# Patient Record
Sex: Female | Born: 1950
Health system: Southern US, Community
[De-identification: ages and names within clinical notes are randomized; demographics above are authoritative.]

## PROBLEM LIST (undated history)

## (undated) DIAGNOSIS — F419 Anxiety disorder, unspecified: Secondary | ICD-10-CM

## (undated) DIAGNOSIS — I10 Essential (primary) hypertension: Secondary | ICD-10-CM

## (undated) DIAGNOSIS — M1990 Unspecified osteoarthritis, unspecified site: Secondary | ICD-10-CM

## (undated) DIAGNOSIS — I639 Cerebral infarction, unspecified: Secondary | ICD-10-CM

## (undated) DIAGNOSIS — J189 Pneumonia, unspecified organism: Secondary | ICD-10-CM

## (undated) DIAGNOSIS — I219 Acute myocardial infarction, unspecified: Secondary | ICD-10-CM

## (undated) DIAGNOSIS — I739 Peripheral vascular disease, unspecified: Secondary | ICD-10-CM

## (undated) DIAGNOSIS — E785 Hyperlipidemia, unspecified: Secondary | ICD-10-CM

## (undated) DIAGNOSIS — Z8489 Family history of other specified conditions: Secondary | ICD-10-CM

## (undated) DIAGNOSIS — G4730 Sleep apnea, unspecified: Secondary | ICD-10-CM

## (undated) DIAGNOSIS — E119 Type 2 diabetes mellitus without complications: Secondary | ICD-10-CM

## (undated) DIAGNOSIS — I2510 Atherosclerotic heart disease of native coronary artery without angina pectoris: Secondary | ICD-10-CM

## (undated) DIAGNOSIS — T8859XA Other complications of anesthesia, initial encounter: Secondary | ICD-10-CM

## (undated) HISTORY — PX: COLONOSCOPY: SHX174

## (undated) HISTORY — PX: CARDIAC CATHETERIZATION: SHX172

## (undated) HISTORY — DX: Atherosclerotic heart disease of native coronary artery without angina pectoris: I25.10

## (undated) HISTORY — DX: Type 2 diabetes mellitus without complications: E11.9

## (undated) HISTORY — PX: OTHER SURGICAL HISTORY: SHX169

## (undated) HISTORY — DX: Essential (primary) hypertension: I10

## (undated) HISTORY — DX: Hyperlipidemia, unspecified: E78.5

## (undated) HISTORY — DX: Anxiety disorder, unspecified: F41.9

## (undated) HISTORY — PX: CORONARY STENT PLACEMENT: SHX1402

---

## 1998-10-24 ENCOUNTER — Other Ambulatory Visit: Admission: RE | Admit: 1998-10-24 | Discharge: 1998-10-24 | Payer: Self-pay

## 2002-01-24 ENCOUNTER — Other Ambulatory Visit: Admission: RE | Admit: 2002-01-24 | Discharge: 2002-01-24 | Payer: Self-pay | Admitting: Family Medicine

## 2003-03-08 ENCOUNTER — Other Ambulatory Visit: Admission: RE | Admit: 2003-03-08 | Discharge: 2003-03-08 | Payer: Self-pay | Admitting: Obstetrics and Gynecology

## 2003-03-10 ENCOUNTER — Ambulatory Visit (HOSPITAL_COMMUNITY): Admission: RE | Admit: 2003-03-10 | Discharge: 2003-03-10 | Payer: Self-pay | Admitting: Obstetrics and Gynecology

## 2003-03-10 ENCOUNTER — Encounter: Payer: Self-pay | Admitting: Obstetrics and Gynecology

## 2004-03-20 ENCOUNTER — Other Ambulatory Visit: Admission: RE | Admit: 2004-03-20 | Discharge: 2004-03-20 | Payer: Self-pay | Admitting: Obstetrics and Gynecology

## 2004-03-28 ENCOUNTER — Encounter: Admission: RE | Admit: 2004-03-28 | Discharge: 2004-03-28 | Payer: Self-pay | Admitting: Cardiology

## 2004-04-25 ENCOUNTER — Ambulatory Visit (HOSPITAL_COMMUNITY): Admission: RE | Admit: 2004-04-25 | Discharge: 2004-04-25 | Payer: Self-pay | Admitting: Obstetrics and Gynecology

## 2004-05-28 ENCOUNTER — Ambulatory Visit (HOSPITAL_BASED_OUTPATIENT_CLINIC_OR_DEPARTMENT_OTHER): Admission: RE | Admit: 2004-05-28 | Discharge: 2004-05-28 | Payer: Self-pay | Admitting: Cardiology

## 2004-08-11 ENCOUNTER — Emergency Department (HOSPITAL_COMMUNITY): Admission: EM | Admit: 2004-08-11 | Discharge: 2004-08-11 | Payer: Self-pay | Admitting: Emergency Medicine

## 2005-02-10 ENCOUNTER — Other Ambulatory Visit: Admission: RE | Admit: 2005-02-10 | Discharge: 2005-02-10 | Payer: Self-pay | Admitting: Obstetrics and Gynecology

## 2005-02-20 ENCOUNTER — Ambulatory Visit: Payer: Self-pay | Admitting: Internal Medicine

## 2005-03-07 ENCOUNTER — Ambulatory Visit: Payer: Self-pay | Admitting: Internal Medicine

## 2006-05-19 ENCOUNTER — Ambulatory Visit: Payer: Self-pay | Admitting: Family Medicine

## 2006-07-08 ENCOUNTER — Ambulatory Visit: Payer: Self-pay | Admitting: Family Medicine

## 2007-09-27 ENCOUNTER — Emergency Department (HOSPITAL_COMMUNITY): Admission: EM | Admit: 2007-09-27 | Discharge: 2007-09-28 | Payer: Self-pay | Admitting: Emergency Medicine

## 2008-04-25 ENCOUNTER — Encounter: Admission: RE | Admit: 2008-04-25 | Discharge: 2008-04-25 | Payer: Self-pay | Admitting: Internal Medicine

## 2008-08-21 ENCOUNTER — Encounter: Admission: RE | Admit: 2008-08-21 | Discharge: 2008-08-21 | Payer: Self-pay | Admitting: Internal Medicine

## 2009-03-26 ENCOUNTER — Encounter: Admission: RE | Admit: 2009-03-26 | Discharge: 2009-03-26 | Payer: Self-pay | Admitting: Family Medicine

## 2010-04-25 ENCOUNTER — Encounter (INDEPENDENT_AMBULATORY_CARE_PROVIDER_SITE_OTHER): Payer: Self-pay | Admitting: *Deleted

## 2010-11-10 ENCOUNTER — Encounter: Payer: Self-pay | Admitting: Obstetrics and Gynecology

## 2010-11-19 NOTE — Letter (Signed)
Summary: Colonoscopy Letter  [facility]  [address] [place], [place] [address]   Phone: [phone]  Fax: [phone]      April 25, 2010 MRN: [id number]   Tammy Graham [address] [address] [place], [place]  [address]   Dear [name],   According to your medical record, it is time for you to schedule a Colonoscopy. The American Cancer Society recommends this procedure as a method to detect early colon cancer. Patients with a family history of colon cancer, or a personal history of colon polyps or inflammatory bowel disease are at increased risk.  This letter has beeen generated based on the recommendations made at the time of your procedure. If you feel that in your particular situation this may no longer apply, please contact our office.  Please call our office at [phone] to schedule this appointment or to update your records at your earliest convenience.  Thank you for cooperating with [name] to provide you with the very best care possible.   Sincerely,  Hedwig Morton. Juanda Chance, M.D.  [facility] [phone]

## 2011-03-07 NOTE — Procedures (Signed)
NAME:  Tammy Graham, Tammy Graham             ACCOUNT NO.:  [id number]   MEDICAL RECORD NO.:  [id number]          PATIENT TYPE:  OUT   LOCATION:  SLEEP CENTER                 FACILITY:  [facility]   PHYSICIAN:  Clinton D. Maple Hudson, M.D. DATE OF BIRTH:  03-Jun-1951   DATE OF ADMISSION:  05/28/2004  DATE OF DISCHARGE:  05/28/2004                              NOCTURNAL POLYSOMNOGRAM   REFERRING PHYSICIAN:  Dr. Kevin Fenton Spruill   INDICATION FOR STUDY:  Hypersomnia with sleep apnea.  Difficulty initiating  and maintaining sleep.  Excessive daytime somnolence.   EPWORTH SLEEPINESS SCORE:  11/24   NECK SIZE:  15 inches   BODY MASS INDEX:  28.5   WEIGHT:  178 pounds   MEDICATIONS LISTED:  Actos.   SLEEP ARCHITECTURE:  Total sleep time 342 minutes for a sleep efficiency of  85%.  Stage I 7%, Stage II 59%, Stages III and IV 14%.  REM 20% of total  sleep time.  Latency to sleep onset 3.5 minutes.  Latency to REM 43 minutes.  Awake after sleep onset 59 minutes.  Arousal index 18.6/hr.  No medications  taken at bedtime.   RESPIRATORY DATA:  Mild obstructive sleep apnea/hypopnea syndrome, RDI 6.8  per hour.  This reflected 38 obstructive hypopneas and 1 obstructive apnea.  Most sleep and most events were recorded while supine, but some hypopneas  were noted while sleeping on right side.  She did not qualify for a split  study protocol because of insufficient early events.  Note that she had  taken a two hour nap prior to arriving at the sleep center.   OXYGEN DATA:  Moderate to loud snoring with mild oxygen desaturation to 80%  associated with apneas and hypopneas.  Baseline room air oxygen saturation  while awake was 98%.  Mean oxygen saturation through the study was 92%.   CARDIAC DATA:  Normal cardiac rhythm.   MOVEMENT/PARASOMNIA:  132 body jerks were recorded of which 39 were  associated with arousals for a period limb movement with arousal index of  6.8 per hour which is mildly abnormal.   IMPRESSION/RECOMMENDATION:  Mild obstructive sleep apnea/hypopnea syndrome,  RDI 6.8 per hour with mild oxygen desaturation and normal cardiac rhythm.  Periodic limb movement with arousal, 6.8 per hour which may respond to  treatment with clonazepam or Requip.                                   ______________________________                                Rennis Chris. Maple Hudson, M.D.                                Diplomate, Biomedical engineer of Sleep Medicine    CDY/MEDQ  D:  06/02/2004 10:56:38  T:  06/03/2004 00:59:08  Job:  [id number]

## 2011-04-06 ENCOUNTER — Emergency Department (HOSPITAL_COMMUNITY): Payer: Self-pay

## 2011-04-06 ENCOUNTER — Inpatient Hospital Stay (HOSPITAL_COMMUNITY)
Admission: EM | Admit: 2011-04-06 | Discharge: 2011-04-09 | DRG: 247 | Disposition: A | Discharge status: Home / Self Care | Payer: Self-pay | Attending: Cardiology | Admitting: Cardiology

## 2011-04-06 DIAGNOSIS — Z8249 Family history of ischemic heart disease and other diseases of the circulatory system: Secondary | ICD-10-CM

## 2011-04-06 DIAGNOSIS — I2510 Atherosclerotic heart disease of native coronary artery without angina pectoris: Secondary | ICD-10-CM | POA: Diagnosis present

## 2011-04-06 DIAGNOSIS — R4182 Altered mental status, unspecified: Secondary | ICD-10-CM | POA: Diagnosis not present

## 2011-04-06 DIAGNOSIS — I10 Essential (primary) hypertension: Secondary | ICD-10-CM | POA: Diagnosis present

## 2011-04-06 DIAGNOSIS — Y9210 Unspecified residential institution as the place of occurrence of the external cause: Secondary | ICD-10-CM | POA: Diagnosis not present

## 2011-04-06 DIAGNOSIS — T40605A Adverse effect of unspecified narcotics, initial encounter: Secondary | ICD-10-CM | POA: Diagnosis not present

## 2011-04-06 DIAGNOSIS — T424X5A Adverse effect of benzodiazepines, initial encounter: Secondary | ICD-10-CM | POA: Diagnosis not present

## 2011-04-06 DIAGNOSIS — E119 Type 2 diabetes mellitus without complications: Secondary | ICD-10-CM | POA: Diagnosis present

## 2011-04-06 DIAGNOSIS — F17200 Nicotine dependence, unspecified, uncomplicated: Secondary | ICD-10-CM | POA: Diagnosis present

## 2011-04-06 DIAGNOSIS — E785 Hyperlipidemia, unspecified: Secondary | ICD-10-CM | POA: Diagnosis present

## 2011-04-06 DIAGNOSIS — I214 Non-ST elevation (NSTEMI) myocardial infarction: Principal | ICD-10-CM | POA: Diagnosis present

## 2011-04-06 LAB — DIFFERENTIAL
Basophils Absolute: 0 10*3/uL (ref 0.0–0.1)
Basophils Relative: 0 % (ref 0–1)
Eosinophils Absolute: 0.2 10*3/uL (ref 0.0–0.7)
Eosinophils Relative: 2 % (ref 0–5)
Lymphocytes Relative: 31 % (ref 12–46)
Lymphs Abs: 2.7 10*3/uL (ref 0.7–4.0)
Monocytes Absolute: 0.5 10*3/uL (ref 0.1–1.0)
Monocytes Relative: 6 % (ref 3–12)
Neutro Abs: 5.3 10*3/uL (ref 1.7–7.7)
Neutrophils Relative %: 61 % (ref 43–77)

## 2011-04-06 LAB — CBC
HCT: 39.8 % (ref 36.0–46.0)
Hemoglobin: 14.3 g/dL (ref 12.0–15.0)
MCH: 33.2 pg (ref 26.0–34.0)
MCHC: 35.9 g/dL (ref 30.0–36.0)
MCV: 92.3 fL (ref 78.0–100.0)
Platelets: 219 10*3/uL (ref 150–400)
RBC: 4.31 MIL/uL (ref 3.87–5.11)
RDW: 12.9 % (ref 11.5–15.5)
WBC: 8.7 10*3/uL (ref 4.0–10.5)

## 2011-04-06 LAB — BASIC METABOLIC PANEL
BUN: 16 mg/dL (ref 6–23)
CO2: 30 mEq/L (ref 19–32)
Calcium: 10.6 mg/dL — ABNORMAL HIGH (ref 8.4–10.5)
Chloride: 101 mEq/L (ref 96–112)
Creatinine, Ser: 1.05 mg/dL (ref 0.50–1.10)
GFR calc Af Amer: >60 mL/min (ref >60)
GFR calc non Af Amer: 53 mL/min — ABNORMAL LOW (ref >60)
Glucose, Bld: 172 mg/dL — ABNORMAL HIGH (ref 70–99)
Potassium: 4.4 mEq/L (ref 3.5–5.1)
Sodium: 139 mEq/L (ref 135–145)

## 2011-04-06 LAB — CK TOTAL AND CKMB (NOT AT ARMC)
CK, MB: 4.1 ng/mL — ABNORMAL HIGH (ref 0.3–4.0)
Relative Index: 1.5 (ref 0.0–2.5)
Total CK: 273 U/L — ABNORMAL HIGH (ref 7–177)

## 2011-04-06 LAB — TROPONIN I: Troponin I: 0.61 ng/mL (ref <0.30)

## 2011-04-07 ENCOUNTER — Inpatient Hospital Stay (HOSPITAL_COMMUNITY): Payer: Self-pay

## 2011-04-07 DIAGNOSIS — I2510 Atherosclerotic heart disease of native coronary artery without angina pectoris: Secondary | ICD-10-CM

## 2011-04-07 LAB — HEMOGLOBIN A1C
Hgb A1c MFr Bld: 7.5 % — ABNORMAL HIGH (ref <5.7)
Mean Plasma Glucose: 169 mg/dL — ABNORMAL HIGH (ref <117)

## 2011-04-07 LAB — LIPID PANEL
Cholesterol: 163 mg/dL (ref 0–200)
HDL: 40 mg/dL (ref >39)
LDL Cholesterol: 97 mg/dL (ref 0–99)
Total CHOL/HDL Ratio: 4.1 RATIO
Triglycerides: 132 mg/dL (ref <150)
VLDL: 26 mg/dL (ref 0–40)

## 2011-04-07 LAB — CARDIAC PANEL(CRET KIN+CKTOT+MB+TROPI)
CK, MB: 18.1 ng/mL (ref 0.3–4.0)
CK, MB: 23.6 ng/mL (ref 0.3–4.0)
Relative Index: 4.3 — ABNORMAL HIGH (ref 0.0–2.5)
Relative Index: 5 — ABNORMAL HIGH (ref 0.0–2.5)
Total CK: 419 U/L — ABNORMAL HIGH (ref 7–177)
Total CK: 472 U/L — ABNORMAL HIGH (ref 7–177)
Troponin I: 4.06 ng/mL (ref <0.30)
Troponin I: 14.78 ng/mL (ref <0.30)

## 2011-04-07 LAB — MRSA PCR SCREENING: MRSA by PCR: NEGATIVE

## 2011-04-07 LAB — PROTIME-INR
INR: 0.94 (ref 0.00–1.49)
Prothrombin Time: 12.8 seconds (ref 11.6–15.2)

## 2011-04-07 LAB — GLUCOSE, CAPILLARY: Glucose-Capillary: 164 mg/dL — ABNORMAL HIGH (ref 70–99)

## 2011-04-07 LAB — POCT ACTIVATED CLOTTING TIME: Activated Clotting Time: 424 seconds

## 2011-04-07 LAB — HEPARIN LEVEL (UNFRACTIONATED): Heparin Unfractionated: 0.61 IU/mL (ref 0.30–0.70)

## 2011-04-08 LAB — BASIC METABOLIC PANEL
CO2: 26 mEq/L (ref 19–32)
Chloride: 105 mEq/L (ref 96–112)
Creatinine, Ser: 0.99 mg/dL (ref 0.50–1.10)
Glucose, Bld: 166 mg/dL — ABNORMAL HIGH (ref 70–99)
Sodium: 139 mEq/L (ref 135–145)

## 2011-04-08 LAB — CBC
Hemoglobin: 13.7 g/dL (ref 12.0–15.0)
MCV: 93.1 fL (ref 78.0–100.0)
Platelets: 210 10*3/uL (ref 150–400)
RBC: 4.18 MIL/uL (ref 3.87–5.11)
WBC: 9.2 10*3/uL (ref 4.0–10.5)

## 2011-04-08 NOTE — Consult Note (Signed)
Tammy Graham, BROPHY NO.:  [id number]  MEDICAL RECORD NO.:  [id number]  LOCATION:  6525                         FACILITY:  [facility]  PHYSICIAN:  Tammy Heritage, MD       DATE OF BIRTH:  17-Mar-1951  DATE OF CONSULTATION:  04/07/2011 DATE OF DISCHARGE:                                CONSULTATION   REFERRING PHYSICIAN:  Cardiology Cath Team.  REASON FOR CONSULTATION:  Code stroke.  CHIEF COMPLAINT:  Sudden altered mental status.  HISTORY OF PRESENT ILLNESS:  This patient is a 60 year old woman who has been admitted for cardiac catheterization after having a chest pain and is status post catheterization half an hour ago or so.  It was noted that when she came out of the cardiac catheterization she had altered mental status in a way that she was not able to recognize her family members.  There is no mention of any focal neurological deficit otherwise. Talking to the patient, she is somewhat more awake now and still stays nonfocal.  However, she is not back to her baseline mentation still.  A CT scan of the head was performed that did not show any acute intracranial abnormality. It is important to mention that the patient has been on IV heparin for the catheterization, also had 4 mg of Versed and 50 mcg of fentanyl during the procedure.  PAST MEDICAL HISTORY:  Hypertension, hyperlipidemia.  PAST SURGICAL HISTORY:  Arthroscopic knee surgery, left side.  ALLERGIES:  No known drug allergies.  MEDICATIONS: 1. She is on Norvasc 10 mg daily. 2. Aspirin 325 mg daily. 3. She was on heparin infusion. 4. Hydrochlorothiazide 25 mg daily.  SOCIAL HISTORY:  She is widowed, has three children.  Smokes almost half a pack a day.  Denies alcohol abuse or illicit drug abuse.  FAMILY HISTORY:  Mother had MI at age 62.  The history from the father side is unknown.  REVIEW OF CLINICAL DATA:  I have reviewed her CT scan of the head performed today and is found to be  unremarkable for any acute abnormality.  I have seen her lab work showing with elevated HbA1c at 7.5 and therapeutic level of unfractionated heparin, LDL of 97, and MB bound creatine kinase of 18.1.  Troponin is as high as well 4.06.  PHYSICAL EXAMINATION:  Comfortably lying down on the bed.  Blood pressure 128/68 mmHg, pulse of 78 per minute. She is not oriented to person or time appropriately yet; however, she did eventually recognize her daughter, called her name. I do not see any aphasia or dysarthria.  NIH stroke scale remained 0 or maybe 1 for mild confusion. Bilateral pupils reactive to light and accommodation with no field cut noted in any quadrant, moves eyes to all direction.  Symmetrical facial sensation and strength testing.  Tongue midline without atrophy or fasciculation.  Palate elevates symmetrically bilaterally. Motor examination reveals strength 5/5 all over.  There is no drift noted. Sensory examination reveals that she admits to feel light touch sensation all over symmetrically equal. Gait was deferred.  IMPRESSION:  A 60 year old woman, has altered mental status with known focality on exam after having cardiac catheterization procedure and  also received fentanyl and versed during that period. My impression is that her clinical situation is less likely result of any focal CNS ischemic pathology rather more of the medication affects during the cardiac catheterization. A TPA will not be offered in this situation because of NIH stroke scale is 1 at the most and symptoms are less likely to be stroke related. Also, she has been on heparin infusion.  PLAN:  I have discussed my impression in detail with the patient's cardiologist over the phone and have suggested giving her antiplatelets as already planned for the cardiac reasons as well. If her mental status does not return to normal baseline until tomorrow morning then I will suggest getting her noncontrast MRI of  the brain at that time. There is no other neurological intervention necessary at this time.          ______________________________ Tammy Heritage, MD     WS/MEDQ  D:  04/07/2011  T:  04/08/2011  Job:  [id number]  Electronically Signed by Tammy Heritage MD on 04/08/2011 08:34:43 AM

## 2011-04-09 DIAGNOSIS — I214 Non-ST elevation (NSTEMI) myocardial infarction: Secondary | ICD-10-CM

## 2011-04-09 LAB — BASIC METABOLIC PANEL
BUN: 14 mg/dL (ref 6–23)
CO2: 27 mEq/L (ref 19–32)
Calcium: 9.4 mg/dL (ref 8.4–10.5)
Chloride: 104 mEq/L (ref 96–112)
Creatinine, Ser: 0.8 mg/dL (ref 0.50–1.10)

## 2011-04-09 LAB — CBC
HCT: 39.6 % (ref 36.0–46.0)
MCH: 32.3 pg (ref 26.0–34.0)
MCV: 93.4 fL (ref 78.0–100.0)
RBC: 4.24 MIL/uL (ref 3.87–5.11)
WBC: 7.6 10*3/uL (ref 4.0–10.5)

## 2011-04-09 LAB — URINALYSIS, DIPSTICK ONLY
Bilirubin Urine: NEGATIVE
Hgb urine dipstick: NEGATIVE
Ketones, ur: NEGATIVE mg/dL
Protein, ur: NEGATIVE mg/dL
Urobilinogen, UA: 1 mg/dL (ref 0.0–1.0)

## 2011-04-10 NOTE — Cardiovascular Report (Signed)
Tammy Graham, Tammy Graham NO.:  [id number]  MEDICAL RECORD NO.:  [id number]  LOCATION:  6525                         FACILITY:  [facility]  PHYSICIAN:  Verne Carrow, MDDATE OF BIRTH:  10-14-1951  DATE OF PROCEDURE:  04/07/2011 DATE OF DISCHARGE:                           CARDIAC CATHETERIZATION   PROCEDURES PERFORMED: 1. Left heart catheterization. 2. Selective coronary angiography. 3. Left ventricular angiogram. 4. Percutaneous transluminal coronary angioplasty with placement of a     drug-eluting stent in the ostium of the left anterior descending     artery.  OPERATOR:  Verne Carrow, MD.  ARTERIAL ACCESS SITE:  Right radial artery.  INDICATIONS:  This is a 60 year old African American female with a history of hypertension and hyperlipidemia who was admitted to the hospital on April 06, 2011, with complaints of chest pain.  She ruled in for myocardial infarction with serial cardiac enzymes.  Diagnostic catheterization was arranged for today.  PROCEDURE IN DETAIL:  The patient was brought to the Main Cardiac Catheterization Laboratory after signing informed consent for the procedure.  An Freida Busman test was performed on the right wrist and was positive.  The right wrist was prepped and draped in a sterile fashion. Lidocaine 1%  was used for local anesthesia.  A 5-French sheath was inserted into the right radial artery without difficulty.  Standard diagnostic catheters were used to perform selective coronary angiography.  3 mg of verapamil was given through the sheath after insertion.  4000 units of intravenous heparin was given after sheath insertion.  A pigtail catheter was used to perform a left ventricular angiogram.  At this point in procedure, we elected to proceed with intervention of the severe stenosis in the ostium of the left anterior descending artery.  The sheath was up-sized to a 6-French sheath.  The patient was given a bolus of  Angiomax and a drip was started.  The patient was given 60 mg of Effient.  We then engaged the left main artery with a JL-3.5 guiding catheter.  This was not my first choice of guiding catheters, however, we could not get any of our standard guiding catheters to fit into the root and to engage the left main artery.  Once the guide was seated in the left main artery, we checked an ACT which was greater than 200.  We then passed a Cougar intracoronary wire down the length of the left anterior descending artery.  A 2.5 x 12 mm balloon was inflated in the area of tightest stenosis.  A 3.0 x 12 mm Promus Element drug- eluting stent was carefully positioned in the ostium of the left anterior descending artery and deployed without difficulty.  A 3.25 x 9 mm noncompliant balloon was carefully positioned inside the vessel and was inflated to 16 atmospheres.  There was an excellent angiographic result.  The stenosis was taken from 99% down to 0%.  There was excellent flow into the distal vessel.  There were no immediate complications.  The sheath was removed here in the Cath Lab and a Terumo hemostasis band was applied over the arteriotomy site.  The patient was taken to the recovery area in stable condition.  HEMODYNAMIC FINDINGS:  Central aortic pressure 109/69.  Left ventricular pressure 113/13/17.  ANGIOGRAPHIC FINDINGS: 1. The left main coronary artery was a short segment and was normal. 2. The left anterior descending artery was a large vessel that coursed     to the apex and gave off several diagonal branches.  The ostium of     the LAD had a 99% stenosis.  There was no other severe disease in     the vessel. 3. The circumflex artery gave off a small first obtuse marginal branch     and a moderate to large sized second obtuse marginal branch.  There     was mild plaque disease in this vessel, but no focally obstructive     lesions. 4. The right coronary artery was a moderate-sized  dominant vessel with     mild 30% plaque in the midportion. 5. Left ventricular angiogram was performed in the RAO projection and     showed normal left ventricular systolic function with ejection     fraction of 65%.  IMPRESSION: 1. Single-vessel coronary artery disease in the setting of a non-ST-     elevation myocardial infarction. 2. Successful percutaneous transluminal coronary angioplasty with     placement of a drug-eluting stent in the proximal portion of the     left anterior descending artery extending back to the ostium. 3. Normal left ventricular systolic function.  RECOMMENDATIONS:  The patient should be continued on dual antiplatelet therapy for at least 1 year.  We will continue aspirin 81 mg as well as Effient 10 mg.  She will be continued on a beta-blocker.  A statin will be started.  We will most likely continue her on Effient for several months and then switched to Plavix.  She will be watched closely tonight and discharged home tomorrow if she is stable.     Verne Carrow, MD     CM/MEDQ  D:  04/07/2011  T:  04/08/2011  Job:  [id number]  Electronically Signed by Verne Carrow MD on 04/10/2011 09:02:23 AM

## 2011-04-11 NOTE — Discharge Summary (Signed)
Tammy Graham, TAPLEY NO.:  [id number]  MEDICAL RECORD NO.:  [id number]  LOCATION:  6525                         FACILITY:  [facility]  PHYSICIAN:  Verne Carrow, MDDATE OF BIRTH:  12/17/1950  DATE OF ADMISSION:  04/06/2011 DATE OF DISCHARGE:  04/09/2011                              DISCHARGE SUMMARY   PRIMARY CARDIOLOGIST:  Verne Carrow, MD  Primary care will be [facility] in the future.  DISCHARGE DIAGNOSIS:  Non-ST-segment elevation myocardial infarction.  SECONDARY DIAGNOSES: 1. Coronary artery disease, status post successful percutaneous     coronary intervention and drug-eluting stent placement to the left     anterior descending this admission. 2. Hypertension. 3. Hyperlipidemia. 4. Altered mental status following catheterization with normal head     CT. 5. Tobacco abuse. 6. Newly diagnosed diabetes mellitus.  ALLERGIES:  No known drug allergies.  PROCEDURES: 1. Left heart cardiac catheterization, performed on April 07, 2011,     revealing a 99% ostial left anterior descending stenosis with mild     plaque in the left circumflex and a 30% mid right coronary artery     stenosis.  Ejection fraction of 65%.  The ostial left anterior     descending was successfully stented with a 3.0 x 12 mm Promus     Element Plus drug-eluting stent. 2. CT of the head without contrast showing no acute or significant     findings.  HISTORY OF PRESENT ILLNESS:  A 60 year old female without prior history of coronary artery disease was in her usual state of health until several months prior to admission when she began to experience intermittent chest and left arm numbness.  On the evening of April 06, 2011, the patient noted severe chest pressure with associated diaphoresis, which awoke him from sleep.  This pain persisted.  She presented to the [facility] ED where she was noted to have an elevation of her troponin to 0.61.  ECG showed no acute changes.  The  patient was treated with aspirin, beta-blocker, and heparin, and admitted for further evaluation.  HOSPITAL COURSE:  The patient eventually peaked her CK at 472.  Her MB at 23.6 and her troponin I at 14.78, thus ruling in for non-ST-elevation MI.  The patient had no further chest discomfort and decision was made to proceed with diagnostic catheterization, which took place on April 07, 2011, revealing a 99% ostial LAD stenosis and otherwise nonobstructive disease and normal LV function.  The ostial ID was successfully stented with a 3.0 x 12 mm Promus Element Plus drug-eluting stent.  The patient tolerated this procedure well; however, postprocedure was noted be quite lethargic with difficulty recognizing family members and acute confusion.  A code stroke was initially called, and the patient was taken for a STAT head CT, which showed no acute or significant findings. The patient was evaluated by Neurology who well ultimately felt that altered mental status was most probably explained by use of conscious sedation during catheterization.  By early evening, the patient's confusion had completely resolved and no further Neurology workup was felt to be necessary.  Tammy Graham has been seen by Cardiac Rehab and has ambulated without recurrent symptoms or limitations.  She has also been counseled on the importance of smoking cessation.  We have also worked with case management to help Tammy Graham to fill out paperwork for the Effient assistance plan.  Finally, Tammy Graham has been noted to exhibit hyperglycemia throughout her admission with fasting sugars in the 160-170 range.  Hemoglobin A1c was evaluated and noted to be high at 7.5.  We plan to initiate low-dose metformin at the time of discharge and case management has helped to arrange for followup with [facility].  The patient will be discharged home today in good condition.  DISCHARGE LABORATORIES:  Hemoglobin 13.7, hematocrit 39.6,  WBC 7.6, platelets 206, INR 0.94.  Sodium 139, potassium 4.1, chloride 104, CO2 of 27, BUN 14, creatinine 0.80, glucose 163, calcium 9.4.  Hemoglobin A1c 7.5.  CK 472, MB 23.6, troponin I 14.78.  Total cholesterol 172, triglycerides 132, HDL 40, LDL 97.  Urinalysis was negative.  MRSA screen is negative.  DISPOSITION:  The patient will be discharged home today in good condition.  FOLLOWUP PLANS AND APPOINTMENTS:  The patient will follow up with [facility] within the next 2 weeks.  The patient will follow up with Dr. Verne Carrow on April 06, 2011, at 4:15 p.m.  DISCHARGE MEDICATIONS: 1. Aspirin 81 mg daily. 2. Metoprolol 25 mg b.i.d. 3. Nicotine patch 14 mg x6 weeks and 7 mg x2 weeks. 4. Nitroglycerin 0.4 mg p.r.n. chest pain. 5. Pravachol 40 mg nightly. 6. Prasugrel 10 mg daily. 7. Amlodipine 10 mg daily. 8. Lisinopril and hydrochlorothiazide 20/25 mg daily. 9. Metformin 500 mg daily to be started on April 10, 2011.  OUTSTANDING LABORATORIES AND STUDIES:  The patient will require followup lipids and LFTs in 6-8 weeks given new statin therapy.  DURATION OF DISCHARGE ENCOUNTER:  60 minutes including physician time.     Nicolasa Ducking, ANP   ______________________________ Verne Carrow, MD    CB/MEDQ  D:  04/09/2011  T:  04/10/2011  Job:  [id number]  cc:   Clinic [facility]  Electronically Signed by Nicolasa Ducking ANP on 04/11/2011 02:33:57 PM Electronically Signed by Verne Carrow MD on 04/11/2011 05:34:11 PM

## 2011-04-15 ENCOUNTER — Telehealth: Payer: Self-pay | Admitting: Cardiovascular Disease

## 2011-04-15 NOTE — Telephone Encounter (Signed)
Pt needs letter of recommendation for section 8 stating that she needs to move due to not being able to go up and down the stairs, that her

## 2011-04-15 NOTE — H&P (Signed)
NAMEPRECILLA, Tammy Graham NO.:  [id number]  MEDICAL RECORD NO.:  [id number]  LOCATION:  6525                         FACILITY:  [facility]  PHYSICIAN:  Armanda Magic, M.D.     DATE OF BIRTH:  04-23-1951  DATE OF ADMISSION:  04/06/2011 DATE OF DISCHARGE:                             HISTORY & PHYSICAL   REFERRING PHYSICIAN:  Emeline General, MD, in the emergency room.  CHIEF COMPLAINT:  Chest pain.  HISTORY OF PRESENT ILLNESS:  This is a 60 year old female with a history of hypertension and family history of CAD who presented to the emergency with complaints of chest pain.  [place] and started having chest pain and left arm numbness intermittently for the past few months.  Tonight she awakened from sleep with severe chest pressure, but no radiation of the pain.  She then broke out in the sweat.  She denied any nausea or vomiting.  She presented to the emergency room secondary to ongoing chest pain.  Chest pain initially started at 7 p.m.  Currently, she is pain free after morphine.  She did have some shortness of breath as well.  PAST MEDICAL HISTORY: 1. Hypertension. 2. Dyslipidemia.  PAST SURGICAL HISTORY:  Arthroscopic knee surgery on the left knee.  ALLERGIES:  None.  MEDICATIONS: 1. Lisinopril HCT 20/25 mg daily. 2. Norvasc 10 mg daily.  FAMILY HISTORY:  Mother died of MI at 30.  Her father died of unknown causes.  SOCIAL HISTORY:  She is widowed, with 3 children.  She had an infant that died.  She does smoke 1/2-1 pack of cigarettes daily and occasionally drinks alcohol.  REVIEW OF SYSTEMS:  Otherwise as stated in the HPI is negative.  PHYSICAL EXAMINATION:  VITAL SIGNS:  Blood pressure is 111/87, heart rate 78 beats per minute, respirations 22, O2 and saturations 100% on room air. GENERAL:  This is a well-developed, well-nourished black female in no acute distress. HEENT:  Benign. NECK:  Supple without  lymphadenopathy.  Carotid upstroke are +2 bilaterally.  No bruits. LUNGS:  Clear to auscultation throughout. HEART:  Regular rate and rhythm.  No murmurs, rubs, or gallops.  Normal S1 and S2. ABDOMEN:  Soft, nontender, and nondistended.  Normoactive bowel sounds. No hepatosplenomegaly. EXTREMITIES:  No cyanosis, erythema, or edema.  +2 dorsalis pedis pulses bilaterally.  LABORATORY DATA:  Sodium 139, potassium 4.4, chloride 101, CO2 of 30, BUN 16, and creatinine 1.05.  White cell count 8.7, hemoglobin 14.3, hematocrit 39.8, and platelet count 219.  CPK 273, MB 4.1, and troponin 0.61.  EKG shows sinus rhythm.  Chest x-ray shows no active disease.  ASSESSMENT: 1. Non-ST-elevation myocardial infarction, currently pain free.  EKG     shows no ischemia. 2. Hypertension. 3. Dyslipidemia, not on medication at present.  PLAN:  Admit to TCU, IV heparin drip and nitroglycerin drip per Pharmacy.  Continue aspirin and start ASA and beta-blocker, Lopressor 25 mg 1/2 tablet b.i.d.  Continue Norvasc.  We will hold lisinopril HCT in the morning for possible cath.  We will make n.p.o. after midnight tonight and most likely perform cath in the morning by [facility].  Armanda Magic, M.D.     TT/MEDQ  D:  04/07/2011  T:  04/07/2011  Job:  [id number]  Electronically Signed by Armanda Magic M.D. on 04/15/2011 10:03:06 PM

## 2011-04-21 ENCOUNTER — Telehealth: Payer: Self-pay | Admitting: Cardiovascular Disease

## 2011-04-21 NOTE — Telephone Encounter (Signed)
Pt having a reaction from med from hospital, pravastatin itchy and diarrhea , stopped taking meds on Friday itching better, diarrhea

## 2011-04-21 NOTE — Telephone Encounter (Signed)
SPOKE WITH PT  CONT TO HAVE SOME DIARRHEA  AND SOME ITCHING  , HAS NOT HAD PRAVASTATIN SINCE FRI (JUNE 29,2012)  INSTRUCTED TO CONT TO HOLD AND TO CALL LATER THIS WEEK WITH UPDATE WILL FORWARD TO DR Clifton James FOR REVIEW .

## 2011-05-05 ENCOUNTER — Encounter: Payer: Self-pay | Admitting: Cardiovascular Disease

## 2011-05-05 ENCOUNTER — Encounter: Payer: Self-pay | Admitting: *Deleted

## 2011-05-06 ENCOUNTER — Encounter: Payer: Self-pay | Admitting: Cardiovascular Disease

## 2011-05-06 ENCOUNTER — Ambulatory Visit (INDEPENDENT_AMBULATORY_CARE_PROVIDER_SITE_OTHER): Payer: Self-pay | Admitting: Cardiovascular Disease

## 2011-05-06 ENCOUNTER — Encounter: Payer: Self-pay | Admitting: Cardiology

## 2011-05-06 VITALS — BP 118/76 | HR 70 | Resp 16 | Ht 67.0 in | Wt 169.0 lb

## 2011-05-06 DIAGNOSIS — I2510 Atherosclerotic heart disease of native coronary artery without angina pectoris: Secondary | ICD-10-CM | POA: Insufficient documentation

## 2011-05-06 MED ORDER — ROSUVASTATIN CALCIUM 5 MG PO TABS: 5.0000 mg | ORAL_TABLET | Freq: Every day | ORAL | Status: DC

## 2011-05-06 NOTE — Progress Notes (Signed)
History of Present Illness:60 yo AAF with history of DM, HTN, HLD and recently diagnosed CAD who was admitted to [facility] June 2012 with a NSTEMI and was found to have a 99% ostial LAD stenosis. She had no disease in the other vessels. Successful PTCA with placement of Promus Element DES in the ostium of the LAD. She had some mental status changes following the PCI mostly with expressive aphasia. Code stroke was called. Head CT negative. She totally recovered. LV function was normal.   She is here today for follow up. She denies any chest pain or SOB. Occasionally her arms feel numb. She also had "sensation of insects crawling" over lower ext.   Her primary care doctor will be [facility] on [address].   Past Medical History  Diagnosis Date  . CAD (coronary artery disease)   . HTN (hypertension)   . Hyperlipemia   . DM (diabetes mellitus)   . Chest pain   . Anxiety     No past surgical history on file.  Current Outpatient Prescriptions  Medication Sig Dispense Refill  . aspirin 81 MG tablet Take 81 mg by mouth daily.        Marland Kitchen lisinopril-hydrochlorothiazide (PRINZIDE,ZESTORETIC) 20-25 MG per tablet Take 1 tablet by mouth daily.        . metFORMIN (GLUCOPHAGE) 500 MG tablet Take 500 mg by mouth daily with breakfast.        . metoprolol tartrate (LOPRESSOR) 25 MG tablet Take 25 mg by mouth 2 (two) times daily.        . nitroGLYCERIN (NITROSTAT) 0.4 MG SL tablet Place 0.4 mg under the tongue every 5 (five) minutes as needed.        . prasugrel (EFFIENT) 10 MG TABS Take 10 mg by mouth daily.          Allergies not on file  History   Social History  . Marital Status: Widowed    Spouse Name: N/A    Number of Children: N/A  . Years of Education: N/A   Occupational History  . Not on file.   Social History Main Topics  . Smoking status: Current Some Day Smoker -- 15 years    Types: Cigarettes  . Smokeless tobacco: Not on file  . Alcohol Use: Not on file  .  Drug Use: Not on file  . Sexually Active: Not on file   Other Topics Concern  . Not on file   Social History Narrative  . No narrative on file    No family history on file.  Review of Systems:  As stated in the HPI and otherwise negative.   BP 118/76  Pulse 70  Resp 16  Ht 5\' 7"  (1.702 m)  Wt 169 lb (76.658 kg)  BMI 26.47 kg/m2  Physical Examination: General: Well developed, well nourished, NAD HEENT: OP clear, mucus membranes moist SKIN: warm, dry. No rashes. Neuro: No focal deficits Musculoskeletal: Muscle strength 5/5 all ext Psychiatric: Mood and affect normal Neck: No JVD, no carotid bruits, no thyromegaly, no lymphadenopathy. Lungs:Clear bilaterally, no wheezes, rhonci, crackles Cardiovascular: Regular rate and rhythm. No murmurs, gallops or rubs. Abdomen:Soft. Bowel sounds present. Non-tender.  Extremities: No lower extremity edema. Pulses are 2 + in the bilateral DP/PT.  Cardiac Cath: June 19,2012:  1. The left main coronary artery was a short segment and was normal.   2. The left anterior descending artery was a large vessel that coursed  to the apex and gave off several diagonal branches.  The ostium of       the LAD had a 99% stenosis.  There was no other severe disease in       the vessel.   3. The circumflex artery gave off a small first obtuse marginal branch       and a moderate to large sized second obtuse marginal branch.  There       was mild plaque disease in this vessel, but no focally obstructive       lesions.   4. The right coronary artery was a moderate-sized dominant vessel with       mild 30% plaque in the midportion.   5. Left ventricular angiogram was performed in the RAO projection and       showed normal left ventricular systolic function with ejection       fraction of 65%.

## 2011-05-06 NOTE — Patient Instructions (Signed)
Your physician recommends that you schedule a follow-up appointment in: 6 months  

## 2011-05-06 NOTE — Assessment & Plan Note (Signed)
Stable post PCI/DES to the LAD. Will continue ASA and Efffient for 3 months then switch to ASA/Plavix. She has a 90 day supply of Effient and already has a day supply of Plavix. Continue beta blocker. Will change Pravastatin to Crestor 5 mg po Qdaily. Note given for handicap parking pass and letter for housing situation.

## 2011-05-25 ENCOUNTER — Emergency Department (HOSPITAL_COMMUNITY)
Admission: EM | Admit: 2011-05-25 | Discharge: 2011-05-25 | Disposition: A | Discharge status: Home / Self Care | Payer: Self-pay | Attending: Emergency Medicine | Admitting: Emergency Medicine

## 2011-05-25 DIAGNOSIS — I10 Essential (primary) hypertension: Secondary | ICD-10-CM | POA: Insufficient documentation

## 2011-05-25 DIAGNOSIS — Z79899 Other long term (current) drug therapy: Secondary | ICD-10-CM | POA: Insufficient documentation

## 2011-05-25 DIAGNOSIS — N390 Urinary tract infection, site not specified: Secondary | ICD-10-CM | POA: Insufficient documentation

## 2011-05-25 DIAGNOSIS — L509 Urticaria, unspecified: Secondary | ICD-10-CM | POA: Insufficient documentation

## 2011-05-25 DIAGNOSIS — F411 Generalized anxiety disorder: Secondary | ICD-10-CM | POA: Insufficient documentation

## 2011-05-25 DIAGNOSIS — L299 Pruritus, unspecified: Secondary | ICD-10-CM | POA: Insufficient documentation

## 2011-05-25 LAB — URINE MICROSCOPIC-ADD ON

## 2011-05-25 LAB — DIFFERENTIAL
Eosinophils Absolute: 0.2 10*3/uL (ref 0.0–0.7)
Lymphs Abs: 3.9 10*3/uL (ref 0.7–4.0)
Neutro Abs: 3.5 10*3/uL (ref 1.7–7.7)
Neutrophils Relative %: 42 % — ABNORMAL LOW (ref 43–77)

## 2011-05-25 LAB — COMPREHENSIVE METABOLIC PANEL
ALT: 14 U/L (ref 0–35)
AST: 14 U/L (ref 0–37)
CO2: 30 mEq/L (ref 19–32)
Chloride: 105 mEq/L (ref 96–112)
Creatinine, Ser: 1.06 mg/dL (ref 0.50–1.10)
GFR calc non Af Amer: 53 mL/min — ABNORMAL LOW (ref >60)
Sodium: 142 mEq/L (ref 135–145)
Total Bilirubin: 0.3 mg/dL (ref 0.3–1.2)

## 2011-05-25 LAB — URINALYSIS, ROUTINE W REFLEX MICROSCOPIC
Glucose, UA: 500 mg/dL — AB
Hgb urine dipstick: NEGATIVE
Ketones, ur: 15 mg/dL — AB
Protein, ur: NEGATIVE mg/dL

## 2011-05-25 LAB — CBC
MCV: 93 fL (ref 78.0–100.0)
Platelets: 243 10*3/uL (ref 150–400)
RBC: 4.55 MIL/uL (ref 3.87–5.11)
WBC: 8.1 10*3/uL (ref 4.0–10.5)

## 2011-05-26 LAB — URINE CULTURE

## 2011-05-28 ENCOUNTER — Telehealth: Payer: Self-pay | Admitting: *Deleted

## 2011-05-28 NOTE — Telephone Encounter (Signed)
Patient's last colonoscopy was completed 03/07/2005 due to family history of colon cancer in a parent. Her colonoscopy at that time showed diverticulosis and hyperplastic colon polyps. She is overdue for her recall colonoscopy (5 years). I have spoken to patient and she states that she has no insurance and she also had a recent myocardial infarction. I have explained to patient that our billing department can possibly come up with a payment plan for her if she would like to go forward with colonoscopy. I have given her the number to our billing department. She will call back at a later time.

## 2011-07-03 ENCOUNTER — Other Ambulatory Visit (HOSPITAL_COMMUNITY): Payer: Self-pay | Admitting: Family Medicine

## 2011-07-03 DIAGNOSIS — Z780 Asymptomatic menopausal state: Secondary | ICD-10-CM

## 2011-07-03 DIAGNOSIS — Z1231 Encounter for screening mammogram for malignant neoplasm of breast: Secondary | ICD-10-CM

## 2011-07-16 ENCOUNTER — Ambulatory Visit (HOSPITAL_COMMUNITY)
Admission: RE | Admit: 2011-07-16 | Discharge: 2011-07-16 | Disposition: A | Discharge status: Home / Self Care | Payer: Self-pay | Source: Ambulatory Visit | Attending: Family Medicine | Admitting: Family Medicine

## 2011-07-16 DIAGNOSIS — Z1231 Encounter for screening mammogram for malignant neoplasm of breast: Secondary | ICD-10-CM | POA: Insufficient documentation

## 2011-07-16 DIAGNOSIS — Z780 Asymptomatic menopausal state: Secondary | ICD-10-CM

## 2011-07-19 ENCOUNTER — Emergency Department (HOSPITAL_COMMUNITY): Payer: Self-pay

## 2011-07-19 ENCOUNTER — Emergency Department (HOSPITAL_COMMUNITY)
Admission: EM | Admit: 2011-07-19 | Discharge: 2011-07-19 | Disposition: A | Discharge status: Home / Self Care | Payer: Self-pay | Attending: Emergency Medicine | Admitting: Emergency Medicine

## 2011-07-19 DIAGNOSIS — J3089 Other allergic rhinitis: Secondary | ICD-10-CM | POA: Insufficient documentation

## 2011-07-28 LAB — PROTIME-INR: INR: 0.9

## 2011-07-28 LAB — I-STAT 8, (EC8 V) (CONVERTED LAB)
Acid-Base Excess: 3 — ABNORMAL HIGH
BUN: 10
Bicarbonate: 29.6 — ABNORMAL HIGH
Chloride: 107
HCT: 46
Hemoglobin: 15.6 — ABNORMAL HIGH
Operator id: 294341
Sodium: 142
pCO2, Ven: 51.8 — ABNORMAL HIGH

## 2011-07-28 LAB — CBC
HCT: 42.7
Hemoglobin: 14.7
RBC: 4.48
WBC: 7.2

## 2011-07-28 LAB — DIFFERENTIAL
Basophils Absolute: 0
Eosinophils Relative: 5
Lymphocytes Relative: 41
Lymphs Abs: 3
Monocytes Absolute: 0.6
Neutro Abs: 3.3

## 2011-07-28 LAB — POCT I-STAT CREATININE: Operator id: 294341

## 2011-07-28 LAB — POCT CARDIAC MARKERS
CKMB, poc: 2.4
CKMB, poc: 2.9
Myoglobin, poc: 95.9
Troponin i, poc: <0.05
Troponin i, poc: <0.05

## 2011-09-24 ENCOUNTER — Encounter (HOSPITAL_COMMUNITY): Payer: Self-pay | Admitting: *Deleted

## 2011-09-25 ENCOUNTER — Encounter (HOSPITAL_COMMUNITY)
Admission: RE | Disposition: A | Discharge status: Home / Self Care | Payer: Self-pay | Source: Ambulatory Visit | Attending: Gastroenterology

## 2011-09-25 ENCOUNTER — Encounter (HOSPITAL_COMMUNITY): Payer: Self-pay | Admitting: *Deleted

## 2011-09-25 ENCOUNTER — Ambulatory Visit (HOSPITAL_COMMUNITY)
Admission: RE | Admit: 2011-09-25 | Discharge: 2011-09-25 | Disposition: A | Discharge status: Home / Self Care | Payer: Self-pay | Source: Ambulatory Visit | Attending: Gastroenterology | Admitting: Gastroenterology

## 2011-09-25 DIAGNOSIS — Z1211 Encounter for screening for malignant neoplasm of colon: Secondary | ICD-10-CM | POA: Insufficient documentation

## 2011-09-25 HISTORY — DX: Sleep apnea, unspecified: G47.30

## 2011-09-25 HISTORY — DX: Acute myocardial infarction, unspecified: I21.9

## 2011-09-25 HISTORY — PX: COLONOSCOPY: SHX5424

## 2011-09-25 HISTORY — DX: Unspecified osteoarthritis, unspecified site: M19.90

## 2011-09-25 SURGERY — COLONOSCOPY
Anesthesia: Moderate Sedation

## 2011-09-25 MED ORDER — SODIUM CHLORIDE 0.9 % IV SOLN
INTRAVENOUS | Status: DC
  Administered 2011-09-25: 500 mL via INTRAVENOUS

## 2011-09-25 MED ORDER — FENTANYL CITRATE 0.05 MG/ML IJ SOLN
INTRAMUSCULAR | Status: AC
  Filled 2011-09-25: qty 4

## 2011-09-25 MED ORDER — FENTANYL CITRATE 0.05 MG/ML IJ SOLN
INTRAMUSCULAR | Status: DC | PRN
  Administered 2011-09-25 (×4): 25 ug via INTRAVENOUS

## 2011-09-25 MED ORDER — MIDAZOLAM HCL 10 MG/2ML IJ SOLN
INTRAMUSCULAR | Status: AC
  Filled 2011-09-25: qty 4

## 2011-09-25 MED ORDER — SODIUM CHLORIDE 0.9 % IV SOLN
Freq: Once | INTRAVENOUS | Status: AC
  Administered 2011-09-25: 500 mL via INTRAVENOUS

## 2011-09-25 MED ORDER — MIDAZOLAM HCL 10 MG/2ML IJ SOLN
INTRAMUSCULAR | Status: DC | PRN
  Administered 2011-09-25 (×4): 2 mg via INTRAVENOUS

## 2011-09-25 MED ORDER — DIPHENHYDRAMINE HCL 50 MG/ML IJ SOLN
INTRAMUSCULAR | Status: AC
  Filled 2011-09-25: qty 1

## 2011-09-25 NOTE — H&P (Signed)
  Office H+P reviewed no changes.

## 2011-09-25 NOTE — Brief Op Note (Signed)
Colon shows few tics otherwise negative.  See scanned endopro note.

## 2011-09-25 NOTE — Op Note (Addendum)
[facility] [address] [place], [place]  [address]  COLONOSCOPY PROCEDURE REPORT  PATIENT:  Tammy, Graham  MR#:  [id number] BIRTHDATE:  Mar 31, 1951, 60 yrs. old  GENDER:  female ENDOSCOPIST:  Carman Ching REF. BY:  [facility], PROCEDURE DATE:  09/25/2011 PROCEDURE:  Diagnostic Colonoscopy ASA CLASS:  Class II INDICATIONS:  Screening MEDICATIONS:   Fentanyl 100 mcg IV, Versed 8 mg IV  DESCRIPTION OF PROCEDURE:   After the risks benefits and alternatives of the procedure were thoroughly explained, informed consent was obtained.  The Pentax Colonoscope [id number] endoscope was introduced through the anus and advanced to the cecum, limited by none.    The quality of the prep was good..  The instrument was then slowly withdrawn as the colon was fully examined. <<PROCEDUREIMAGES>>  FINDINGS:  Rectal exam unremarkable.  Pediatric colonoscope inserted into the colon and advanced to the cecum, where the appendiceal orifice and ileocecal valve were identified.    On careful withdrawal of the colonoscope    Retroflexion revealed  COMPLICATIONS:  None  IMPRESSION: 1. Normal Screening Colon    2. Slight diverticulosis  RECOMMENDATIONS:  Check stool hemoccults yearly, repeat colon in 10 years  ______________________________ Carman Ching  CC:  [facility]  n. eSIGNED:   Carman Ching at 09/25/2011 09:26 AM  Harrington Challenger, [phone]

## 2011-09-26 ENCOUNTER — Encounter (HOSPITAL_COMMUNITY): Payer: Self-pay | Admitting: Gastroenterology

## 2011-12-26 ENCOUNTER — Encounter: Payer: Self-pay | Admitting: Obstetrics and Gynecology

## 2012-09-06 ENCOUNTER — Emergency Department (HOSPITAL_COMMUNITY)
Admission: EM | Admit: 2012-09-06 | Discharge: 2012-09-06 | Disposition: A | Discharge status: Home / Self Care | Payer: Self-pay | Attending: Emergency Medicine | Admitting: Emergency Medicine

## 2012-09-06 ENCOUNTER — Encounter (HOSPITAL_COMMUNITY): Payer: Self-pay | Admitting: Emergency Medicine

## 2012-09-06 DIAGNOSIS — E119 Type 2 diabetes mellitus without complications: Secondary | ICD-10-CM | POA: Insufficient documentation

## 2012-09-06 DIAGNOSIS — M129 Arthropathy, unspecified: Secondary | ICD-10-CM | POA: Insufficient documentation

## 2012-09-06 DIAGNOSIS — F17200 Nicotine dependence, unspecified, uncomplicated: Secondary | ICD-10-CM | POA: Insufficient documentation

## 2012-09-06 DIAGNOSIS — I252 Old myocardial infarction: Secondary | ICD-10-CM | POA: Insufficient documentation

## 2012-09-06 DIAGNOSIS — Z8659 Personal history of other mental and behavioral disorders: Secondary | ICD-10-CM | POA: Insufficient documentation

## 2012-09-06 DIAGNOSIS — G4730 Sleep apnea, unspecified: Secondary | ICD-10-CM | POA: Insufficient documentation

## 2012-09-06 DIAGNOSIS — E785 Hyperlipidemia, unspecified: Secondary | ICD-10-CM | POA: Insufficient documentation

## 2012-09-06 DIAGNOSIS — I10 Essential (primary) hypertension: Secondary | ICD-10-CM | POA: Insufficient documentation

## 2012-09-06 DIAGNOSIS — M545 Low back pain, unspecified: Secondary | ICD-10-CM | POA: Insufficient documentation

## 2012-09-06 DIAGNOSIS — I2510 Atherosclerotic heart disease of native coronary artery without angina pectoris: Secondary | ICD-10-CM | POA: Insufficient documentation

## 2012-09-06 DIAGNOSIS — N390 Urinary tract infection, site not specified: Secondary | ICD-10-CM | POA: Insufficient documentation

## 2012-09-06 DIAGNOSIS — Z7982 Long term (current) use of aspirin: Secondary | ICD-10-CM | POA: Insufficient documentation

## 2012-09-06 LAB — URINALYSIS, ROUTINE W REFLEX MICROSCOPIC
Bilirubin Urine: NEGATIVE
Hgb urine dipstick: NEGATIVE
Nitrite: NEGATIVE
Protein, ur: NEGATIVE mg/dL
Urobilinogen, UA: 1 mg/dL (ref 0.0–1.0)

## 2012-09-06 MED ORDER — NITROFURANTOIN MONOHYD MACRO 100 MG PO CAPS: 100.0000 mg | ORAL_CAPSULE | Freq: Two times a day (BID) | ORAL | Status: DC

## 2012-09-06 MED ORDER — OXYCODONE-ACETAMINOPHEN 5-325 MG PO TABS: 2.0000 | ORAL_TABLET | ORAL | Status: DC | PRN

## 2012-09-06 MED ORDER — OXYCODONE-ACETAMINOPHEN 5-325 MG PO TABS
1.0000 | ORAL_TABLET | Freq: Once | ORAL | Status: AC
  Administered 2012-09-06: 1 via ORAL
  Filled 2012-09-06: qty 1

## 2012-09-06 MED ORDER — CYCLOBENZAPRINE HCL 10 MG PO TABS
10.0000 mg | ORAL_TABLET | Freq: Once | ORAL | Status: AC
  Administered 2012-09-06: 10 mg via ORAL
  Filled 2012-09-06: qty 1

## 2012-09-06 MED ORDER — IBUPROFEN 400 MG PO TABS
800.0000 mg | ORAL_TABLET | Freq: Once | ORAL | Status: AC
  Administered 2012-09-06: 800 mg via ORAL
  Filled 2012-09-06: qty 2

## 2012-09-06 MED ORDER — CYCLOBENZAPRINE HCL 10 MG PO TABS
10.0000 mg | ORAL_TABLET | Freq: Three times a day (TID) | ORAL | Status: DC | PRN
Start: 2012-09-06 — End: 2014-01-07

## 2012-09-06 NOTE — ED Notes (Signed)
Stopped taking her high chole med pravastatin  X 3 weeks ago

## 2012-09-06 NOTE — ED Provider Notes (Signed)
History     CSN: [id number]  Arrival date & time 09/06/12  1208   First MD Initiated Contact with Patient 09/06/12 1507      No chief complaint on file.   (Consider location/radiation/quality/duration/timing/severity/associated sxs/prior treatment) The history is provided by the patient.   61 year old female has been having pain across her lower back for the last 3 weeks. It was significantly worse today. She rates the pain at 10/10. Nothing makes it better nothing makes it worse. There's no radiation of pain. She denies any bowel or bladder dysfunction. She denies urinary urgency, frequency, and tenesmus. She denies weakness, numbness, tingling. She's not had back pain previously. She took acetaminophen with no relief.  Past Medical History  Diagnosis Date  . CAD (coronary artery disease)   . HTN (hypertension)   . Hyperlipemia   . DM (diabetes mellitus)   . Chest pain   . Anxiety   . Myocardial infarction   . Sleep apnea   . Arthritis     shoulder and knees    Past Surgical History  Procedure Date  . Left knee surgery   . Cardiac catheterization   . Coronary stent placement   . Colonoscopy   . Colonoscopy 09/25/2011    Procedure: COLONOSCOPY;  Surgeon: Vertell Novak., MD;  Location: [facility];  Service: Endoscopy;  Laterality: N/A;    Family History  Problem Relation Age of Onset  . Anesthesia problems Neg Hx   . Hypotension Neg Hx   . Malignant hyperthermia Neg Hx   . Pseudochol deficiency Neg Hx     History  Substance Use Topics  . Smoking status: Current Some Day Smoker -- 1.0 packs/day for 15 years    Types: Cigarettes  . Smokeless tobacco: Not on file  . Alcohol Use: Yes    OB History    Grav Para Term Preterm Abortions TAB SAB Ect Mult Living                  Review of Systems  All other systems reviewed and are negative.    Allergies  Pravastatin  Home Medications   Current Outpatient Rx  Name  Route  Sig  Dispense  Refill  .  ASPIRIN 81 MG PO TABS   Oral   Take 81 mg by mouth daily.           Marland Kitchen CALCIUM CARBONATE-VITAMIN D 600-400 MG-UNIT PO TABS   Oral   Take 1 tablet by mouth daily.         Marland Kitchen CARVEDILOL 12.5 MG PO TABS   Oral   Take 12.5 mg by mouth 2 (two) times daily with a meal.         . CLOPIDOGREL BISULFATE 75 MG PO TABS   Oral   Take 75 mg by mouth daily.         Marland Kitchen LISINOPRIL-HYDROCHLOROTHIAZIDE 20-25 MG PO TABS   Oral   Take 1 tablet by mouth daily.           Marland Kitchen METFORMIN HCL 500 MG PO TABS   Oral   Take 500 mg by mouth 2 (two) times daily with a meal.          . MORPHINE SULFATE ER 15 MG PO TBCR   Oral   Take 15 mg by mouth once.         Marland Kitchen NITROGLYCERIN 0.4 MG SL SUBL   Sublingual   Place 0.4 mg under the tongue every 5 (  five) minutes as needed.           Marland Kitchen PRAVASTATIN SODIUM 40 MG PO TABS   Oral   Take 40 mg by mouth at bedtime.         . RED YEAST RICE 600 MG PO CAPS   Oral   Take 600 mg by mouth 1 day or 1 dose.           Marland Kitchen ROSUVASTATIN CALCIUM 5 MG PO TABS   Oral   Take 1 tablet (5 mg total) by mouth at bedtime.   30 tablet   8     BP 144/92  Pulse 64  Temp 97.8 F (36.6 C)  Resp 14  SpO2 96%  Physical Exam  Nursing note and vitals reviewed. 61 year old female, resting comfortably and in no acute distress. Vital signs are significant for hypertension with blood pressure 144/92. Oxygen saturation is 96%, which is normal. Head is normocephalic and atraumatic. PERRLA, EOMI. Oropharynx is clear. Neck is nontender and supple without adenopathy or JVD. Back is nontender and there is no CVA tenderness. There is mild bilateral paralumbar spasm. There is a negative straight leg raise. Lungs are clear without rales, wheezes, or rhonchi. Chest is nontender. Heart has regular rate and rhythm without murmur. Abdomen is soft, flat, nontender without masses or hepatosplenomegaly and peristalsis is normoactive. Extremities have no cyanosis or edema, full  range of motion is present. Skin is warm and dry without rash. Neurologic: Mental status is normal, cranial nerves are intact, there are no motor or sensory deficits.   ED Course  Procedures (including critical care time)  Results for orders placed during the hospital encounter of 09/06/12  URINALYSIS, ROUTINE W REFLEX MICROSCOPIC      Component Value Range   Color, Urine YELLOW  YELLOW   APPearance CLEAR  CLEAR   Specific Gravity, Urine 1.014  1.005 - 1.030   pH 7.0  5.0 - 8.0   Glucose, UA NEGATIVE  NEGATIVE mg/dL   Hgb urine dipstick NEGATIVE  NEGATIVE   Bilirubin Urine NEGATIVE  NEGATIVE   Ketones, ur NEGATIVE  NEGATIVE mg/dL   Protein, ur NEGATIVE  NEGATIVE mg/dL   Urobilinogen, UA 1.0  0.0 - 1.0 mg/dL   Nitrite NEGATIVE  NEGATIVE   Leukocytes, UA MODERATE (*) NEGATIVE  URINE MICROSCOPIC-ADD ON      Component Value Range   Squamous Epithelial / LPF FEW (*) RARE   WBC, UA 7-10  <3 WBC/hpf   Bacteria, UA FEW (*) RARE    1. Low back pain   2. Urinary tract infection       MDM  Low back pain which seems most likely to be muscular. Her records have been reviewed and she has a history of coronary artery disease with stent placement, and also history of stroke. Because of this, she is clearly at risk for atherosclerotic disease and needs to have abdominal aortic aneurysm ruled out. Limited bedside ultrasound was done showing normal caliber aorta without evidence of aneurysm. Images were saved. Urinalysis will be sent to rule out UTI, and she will be given therapeutic trial of ibuprofen, cyclobenzaprine, and Percocet.  Urinalysis is come back positive for mild UTI and I do not believe this is the source of her symptoms, however, she will be sent home with prescription for Macrobid in size and has been sent for culture. She got good relief of pain with above-noted medications. She does have history of peptic ulcer, so she  will not be NSAIDs. She is sent home with prescriptions for  Percocet and Flexeril.      Dione Booze, MD 09/06/12 [phone]

## 2012-09-06 NOTE — ED Notes (Signed)
Low back pain x 3 weeks no injury she states

## 2012-09-06 NOTE — ED Notes (Signed)
NAD noted at time of d/c home 

## 2012-09-07 LAB — URINE CULTURE: Colony Count: 8000

## 2014-01-07 ENCOUNTER — Ambulatory Visit: Payer: Self-pay | Attending: Internal Medicine | Admitting: Internal Medicine

## 2014-01-07 VITALS — BP 131/82 | HR 95 | Temp 98.7°F | Ht 66.5 in | Wt 166.2 lb

## 2014-01-07 DIAGNOSIS — E119 Type 2 diabetes mellitus without complications: Secondary | ICD-10-CM

## 2014-01-07 LAB — POCT GLYCOSYLATED HEMOGLOBIN (HGB A1C): Hemoglobin A1C: 6.5

## 2014-01-07 LAB — GLUCOSE, POCT (MANUAL RESULT ENTRY): POC GLUCOSE: 176 mg/dL — AB (ref 70–99)

## 2014-01-07 MED ORDER — PRAVASTATIN SODIUM 40 MG PO TABS: 40.0000 mg | ORAL_TABLET | Freq: Every day | ORAL | Status: DC

## 2014-01-07 MED ORDER — CARVEDILOL 12.5 MG PO TABS: 12.5000 mg | ORAL_TABLET | Freq: Two times a day (BID) | ORAL | Status: DC

## 2014-01-07 MED ORDER — METFORMIN HCL 500 MG PO TABS: 500.0000 mg | ORAL_TABLET | Freq: Two times a day (BID) | ORAL | Status: DC

## 2014-01-07 MED ORDER — NITROGLYCERIN 0.4 MG SL SUBL: 0.4000 mg | SUBLINGUAL_TABLET | SUBLINGUAL | Status: DC | PRN

## 2014-01-07 MED ORDER — CLOPIDOGREL BISULFATE 75 MG PO TABS: 75.0000 mg | ORAL_TABLET | Freq: Every day | ORAL | Status: DC

## 2014-01-07 MED ORDER — LISINOPRIL-HYDROCHLOROTHIAZIDE 20-25 MG PO TABS: 1.0000 | ORAL_TABLET | Freq: Every day | ORAL | Status: DC

## 2014-01-07 NOTE — Progress Notes (Signed)
Patient ID: Tammy Graham, female   DOB: [birth year], 63 y.o.   MRN: [id number]   CC:  HPI: 63 year old female comes in to establish care. She has a history of of DM, HTN, HLD  CAD who was admitted to [facility] in June 2012 with a NSTEMI and was found to have a 99% ostial LAD stenosis. She had no disease in the other vessels. Successful PTCA with placement of Promus Element DES in the ostium of the LAD. She had some mental status changes following the PCI mostly with expressive aphasia. Code stroke was called. Head CT negative. She totally recovered. LV function was normal.  She is currently taking Plavix. She saw a cardiologist in [place] in October 2014. She wants to transfer her care back to [place]. The patient also had a mammogram and Pap smear that was negative. The patient endorses that she is very compliant with her diet. She is a diabetic on metformin and A1c 6.7 today. CBG was 176 She had a colonoscopy in 2012 done by Dr. Deatra Ina.  She denies any chest pain shortness of breath cardiopulmonary symptoms  Social history smokes 2-3 cigarettes a day denies drinking any alcohol  Family history daughter has cervical cancer which is end-stage Mother had colon cancer     Allergies  Allergen Reactions  . Pravastatin Other (See Comments)    teva brand only   Past Medical History  Diagnosis Date  . CAD (coronary artery disease)   . HTN (hypertension)   . Hyperlipemia   . DM (diabetes mellitus)   . Chest pain   . Anxiety   . Myocardial infarction   . Sleep apnea   . Arthritis     shoulder and knees   Current Outpatient Prescriptions on File Prior to Visit  Medication Sig Dispense Refill  . aspirin 81 MG tablet Take 81 mg by mouth daily.        . Calcium Carbonate-Vitamin D (CALTRATE 600+D) 600-400 MG-UNIT per tablet Take 1 tablet by mouth daily.       No current facility-administered medications on file prior to visit.   Family History  Problem Relation Age  of Onset  . Anesthesia problems Neg Hx   . Hypotension Neg Hx   . Malignant hyperthermia Neg Hx   . Pseudochol deficiency Neg Hx    History   Social History  . Marital Status: Widowed    Spouse Name: N/A    Number of Children: N/A  . Years of Education: N/A   Occupational History  . Not on file.   Social History Main Topics  . Smoking status: Current Some Day Smoker -- 1.00 packs/day for 15 years    Types: Cigarettes  . Smokeless tobacco: Not on file  . Alcohol Use: Yes  . Drug Use: No  . Sexual Activity: Not Currently    Birth Control/ Protection: None   Other Topics Concern  . Not on file   Social History Narrative  . No narrative on file    Review of Systems  Constitutional: Negative for fever, chills, diaphoresis, activity change, appetite change and fatigue.  HENT: Negative for ear pain, nosebleeds, congestion, facial swelling, rhinorrhea, neck pain, neck stiffness and ear discharge.   Eyes: Negative for pain, discharge, redness, itching and visual disturbance.  Respiratory: Negative for cough, choking, chest tightness, shortness of breath, wheezing and stridor.   Cardiovascular: Negative for chest pain, palpitations and leg swelling.  Gastrointestinal: Negative for abdominal distention.  Genitourinary: Negative  for dysuria, urgency, frequency, hematuria, flank pain, decreased urine volume, difficulty urinating and dyspareunia.  Musculoskeletal: Negative for back pain, joint swelling, arthralgias and gait problem.  Neurological: Negative for dizziness, tremors, seizures, syncope, facial asymmetry, speech difficulty, weakness, light-headedness, numbness and headaches.  Hematological: Negative for adenopathy. Does not bruise/bleed easily.  Psychiatric/Behavioral: Negative for hallucinations, behavioral problems, confusion, dysphoric mood, decreased concentration and agitation.    Objective:   Filed Vitals:   01/07/14 1212  BP: 131/82  Pulse: 95  Temp: 98.7 F  (37.1 C)    Physical Exam  Constitutional: Appears well-developed and well-nourished. No distress.  HENT: Normocephalic. External right and left ear normal. Oropharynx is clear and moist.  Eyes: Conjunctivae and EOM are normal. PERRLA, no scleral icterus.  Neck: Normal ROM. Neck supple. No JVD. No tracheal deviation. No thyromegaly.  CVS: RRR, S1/S2 +, no murmurs, no gallops, no carotid bruit.  Pulmonary: Effort and breath sounds normal, no stridor, rhonchi, wheezes, rales.  Abdominal: Soft. BS +,  no distension, tenderness, rebound or guarding.  Musculoskeletal: Normal range of motion. No edema and no tenderness.  Lymphadenopathy: No lymphadenopathy noted, cervical, inguinal. Neuro: Alert. Normal reflexes, muscle tone coordination. No cranial nerve deficit. Skin: Skin is warm and dry. No rash noted. Not diaphoretic. No erythema. No pallor.  Psychiatric: Normal mood and affect. Behavior, judgment, thought content normal.   Lab Results  Component Value Date   WBC 8.1 05/25/2011   HGB 14.8 05/25/2011   HCT 42.3 05/25/2011   MCV 93.0 05/25/2011   PLT 243 05/25/2011   Lab Results  Component Value Date   CREATININE 1.06 05/25/2011   BUN 13 05/25/2011   NA 142 05/25/2011   K 4.5 05/25/2011   CL 105 05/25/2011   CO2 30 05/25/2011    Lab Results  Component Value Date   HGBA1C 6.5% 01/07/2014   Lipid Panel     Component Value Date/Time   CHOL 163 04/07/2011 0550   TRIG 132 04/07/2011 0550   HDL 40 04/07/2011 0550   CHOLHDL 4.1 04/07/2011 0550   VLDL 26 04/07/2011 0550   LDLCALC 97 04/07/2011 0550       Assessment and plan:   Patient Active Problem List   Diagnosis Date Noted  . CAD (coronary artery disease) 05/06/2011   History of coronary artery disease  currently asymptomatic Continue aspirin, Plavix Continue Coreg and started Referred patient to Dr. Verl Blalock, she should be seen by cardiologist on an annual basis Try to obtain records from her cardiologist in [place]   Diabetes  2 Continue metformin A1c 6.7 Follow up with clinical pharmacy in 3-4 weeks Ophthalmology referral for retinal scan  Establish care Future labs ordered Patient had a mammogram Pap smear in October 2040 Colonoscopy 2012  Patient to follow up in 3 months at which time her mammogram and Pap smear will be scheduled       The patient was given clear instructions to go to ER or return to medical center if symptoms don't improve, worsen or new problems develop. The patient verbalized understanding. The patient was told to call to get any lab results if not heard anything in the next week.

## 2014-01-07 NOTE — Progress Notes (Signed)
Patient is here today  For Establish care, HTN, diabetes

## 2014-01-11 ENCOUNTER — Other Ambulatory Visit: Payer: Self-pay | Admitting: Internal Medicine

## 2014-01-11 ENCOUNTER — Telehealth: Payer: Self-pay | Admitting: Internal Medicine

## 2014-01-11 ENCOUNTER — Ambulatory Visit: Payer: Self-pay | Attending: Internal Medicine

## 2014-01-11 DIAGNOSIS — E119 Type 2 diabetes mellitus without complications: Secondary | ICD-10-CM

## 2014-01-11 LAB — COMPLETE METABOLIC PANEL WITH GFR
ALT: 11 U/L (ref 0–35)
AST: 15 U/L (ref 0–37)
Albumin: 4.3 g/dL (ref 3.5–5.2)
Alkaline Phosphatase: 68 U/L (ref 39–117)
BUN: 17 mg/dL (ref 6–23)
CALCIUM: 9.9 mg/dL (ref 8.4–10.5)
CHLORIDE: 101 meq/L (ref 96–112)
CO2: 31 mEq/L (ref 19–32)
Creat: 1.04 mg/dL (ref 0.50–1.10)
GFR, Est African American: 66 mL/min
GFR, Est Non African American: 57 mL/min — ABNORMAL LOW
Glucose, Bld: 151 mg/dL — ABNORMAL HIGH (ref 70–99)
Potassium: 4.2 mEq/L (ref 3.5–5.3)
SODIUM: 139 meq/L (ref 135–145)
TOTAL PROTEIN: 6.7 g/dL (ref 6.0–8.3)
Total Bilirubin: 0.9 mg/dL (ref 0.2–1.2)

## 2014-01-11 LAB — LIPID PANEL
CHOL/HDL RATIO: 3.2 ratio
Cholesterol: 123 mg/dL (ref 0–200)
HDL: 39 mg/dL — AB (ref >39)
LDL Cholesterol: 63 mg/dL (ref 0–99)
Triglycerides: 103 mg/dL (ref <150)
VLDL: 21 mg/dL (ref 0–40)

## 2014-01-11 LAB — CBC WITH DIFFERENTIAL/PLATELET
Basophils Absolute: 0.1 10*3/uL (ref 0.0–0.1)
Basophils Relative: 1 % (ref 0–1)
Eosinophils Absolute: 0.2 10*3/uL (ref 0.0–0.7)
Eosinophils Relative: 4 % (ref 0–5)
HEMATOCRIT: 41.9 % (ref 36.0–46.0)
Hemoglobin: 14.7 g/dL (ref 12.0–15.0)
LYMPHS PCT: 47 % — AB (ref 12–46)
Lymphs Abs: 2.8 10*3/uL (ref 0.7–4.0)
MCH: 32.3 pg (ref 26.0–34.0)
MCHC: 35.1 g/dL (ref 30.0–36.0)
MCV: 92.1 fL (ref 78.0–100.0)
MONO ABS: 0.5 10*3/uL (ref 0.1–1.0)
Monocytes Relative: 8 % (ref 3–12)
Neutro Abs: 2.4 10*3/uL (ref 1.7–7.7)
Neutrophils Relative %: 40 % — ABNORMAL LOW (ref 43–77)
Platelets: 253 10*3/uL (ref 150–400)
RBC: 4.55 MIL/uL (ref 3.87–5.11)
RDW: 13.6 % (ref 11.5–15.5)
WBC: 5.9 10*3/uL (ref 4.0–10.5)

## 2014-01-11 LAB — GLUCOSE, POCT (MANUAL RESULT ENTRY): POC GLUCOSE: 150 mg/dL — AB (ref 70–99)

## 2014-01-11 LAB — TSH: TSH: 0.4 u[IU]/mL (ref 0.350–4.500)

## 2014-01-11 MED ORDER — AMLODIPINE BESYLATE 10 MG PO TABS: 10.0000 mg | ORAL_TABLET | Freq: Every day | ORAL | Status: DC

## 2014-01-11 MED ORDER — PRASUGREL HCL 10 MG PO TABS: 10.0000 mg | ORAL_TABLET | Freq: Every day | ORAL | Status: DC

## 2014-01-11 NOTE — Telephone Encounter (Signed)
Pt came in today for a lab appt.; pt has requested that she receive a script for [facility] 10mg ; pt has not been scripted that medication here at this facility but by another Physician in 2012; please f/u with pt about the need for this medication;

## 2014-01-11 NOTE — Addendum Note (Signed)
Addended by: Allyson Sabal MD, Ascencion Dike on: 01/11/2014 11:01 AM   Modules accepted: Orders

## 2014-01-12 ENCOUNTER — Telehealth: Payer: Self-pay | Admitting: Emergency Medicine

## 2014-01-12 LAB — VITAMIN D 25 HYDROXY (VIT D DEFICIENCY, FRACTURES): Vit D, 25-Hydroxy: 36 ng/mL (ref 30–89)

## 2014-01-12 NOTE — Telephone Encounter (Signed)
Message copied by Ricci Barker on Thu Jan 12, 2014  5:47 PM ------      Message from: Allyson Sabal MD, Ascencion Dike      Created: Thu Jan 12, 2014  4:10 PM       Notify patient of the labs are normal ------

## 2014-01-12 NOTE — Telephone Encounter (Signed)
Pt given lab results 

## 2014-01-16 ENCOUNTER — Ambulatory Visit: Payer: Self-pay | Admitting: Home Health Services

## 2014-01-16 ENCOUNTER — Ambulatory Visit: Payer: Self-pay | Attending: Internal Medicine

## 2014-01-17 ENCOUNTER — Telehealth: Payer: Self-pay | Admitting: Emergency Medicine

## 2014-01-17 NOTE — Telephone Encounter (Signed)
Message copied by Ricci Barker on Tue Jan 17, 2014  2:05 PM ------      Message from: Allyson Sabal MD, Ascencion Dike      Created: Wed Jan 11, 2014 11:04 AM       Notify patient that A1c is 6.5. Patient already on treatment for this. ------

## 2014-01-17 NOTE — Telephone Encounter (Signed)
Pt given lab results and instructions to continue exercising and dieting

## 2014-02-14 ENCOUNTER — Telehealth: Payer: Self-pay | Admitting: Emergency Medicine

## 2014-02-14 ENCOUNTER — Other Ambulatory Visit: Payer: Self-pay | Admitting: Emergency Medicine

## 2014-02-14 ENCOUNTER — Ambulatory Visit: Payer: Self-pay | Attending: Internal Medicine

## 2014-02-14 MED ORDER — PRASUGREL HCL 10 MG PO TABS: 10.0000 mg | ORAL_TABLET | Freq: Every day | ORAL | Status: DC

## 2014-03-22 ENCOUNTER — Encounter: Payer: Self-pay | Admitting: Cardiology

## 2014-03-22 ENCOUNTER — Ambulatory Visit: Payer: Self-pay | Attending: Cardiology | Admitting: Cardiology

## 2014-03-22 VITALS — BP 115/75 | HR 68 | Temp 99.0°F | Resp 20 | Ht 66.0 in | Wt 166.0 lb

## 2014-03-22 DIAGNOSIS — G4730 Sleep apnea, unspecified: Secondary | ICD-10-CM | POA: Insufficient documentation

## 2014-03-22 DIAGNOSIS — F17200 Nicotine dependence, unspecified, uncomplicated: Secondary | ICD-10-CM | POA: Insufficient documentation

## 2014-03-22 DIAGNOSIS — F419 Anxiety disorder, unspecified: Secondary | ICD-10-CM

## 2014-03-22 DIAGNOSIS — F411 Generalized anxiety disorder: Secondary | ICD-10-CM | POA: Insufficient documentation

## 2014-03-22 DIAGNOSIS — I10 Essential (primary) hypertension: Secondary | ICD-10-CM | POA: Insufficient documentation

## 2014-03-22 DIAGNOSIS — E119 Type 2 diabetes mellitus without complications: Secondary | ICD-10-CM | POA: Insufficient documentation

## 2014-03-22 DIAGNOSIS — E785 Hyperlipidemia, unspecified: Secondary | ICD-10-CM | POA: Insufficient documentation

## 2014-03-22 DIAGNOSIS — Z7982 Long term (current) use of aspirin: Secondary | ICD-10-CM | POA: Insufficient documentation

## 2014-03-22 DIAGNOSIS — Z888 Allergy status to other drugs, medicaments and biological substances status: Secondary | ICD-10-CM | POA: Insufficient documentation

## 2014-03-22 DIAGNOSIS — E782 Mixed hyperlipidemia: Secondary | ICD-10-CM | POA: Insufficient documentation

## 2014-03-22 DIAGNOSIS — I252 Old myocardial infarction: Secondary | ICD-10-CM | POA: Insufficient documentation

## 2014-03-22 DIAGNOSIS — I2510 Atherosclerotic heart disease of native coronary artery without angina pectoris: Secondary | ICD-10-CM | POA: Insufficient documentation

## 2014-03-22 DIAGNOSIS — Z720 Tobacco use: Secondary | ICD-10-CM | POA: Insufficient documentation

## 2014-03-22 DIAGNOSIS — Z79899 Other long term (current) drug therapy: Secondary | ICD-10-CM | POA: Insufficient documentation

## 2014-03-22 MED ORDER — PRAVASTATIN SODIUM 20 MG PO TABS: 20.0000 mg | ORAL_TABLET | Freq: Every day | ORAL | Status: DC

## 2014-03-22 MED ORDER — LANCETS MICRO THIN 33G MISC: Status: DC

## 2014-03-22 MED ORDER — GLUCOSE BLOOD VI STRP: ORAL_STRIP | Status: DC

## 2014-03-22 NOTE — Progress Notes (Signed)
HPI Tammy Graham comes in today to establish with [name] as her cardiology team. She is 63 years of age. She had a NSTEMI and 2012. At that time she had a drug-eluting stent placed to the proximal LAD. She had a residual 30% and a right coronary artery. Ejection fraction was normal.  She initially stopped smoking but because of stress in her family she is started back. She is diabetic. She has a history of hyperlipidemia but looking back to her labs at the time of her MI her LDL was less than 100. She took Crestor for while but then was switched to pravastatin. She currently is not on any statin. She takes simvastatin for while but did not feel well on it.  She has hypertension and significant problems with anxiety.  She denies any chest pain or ischemic symptoms. She denies any symptoms of TIAs or claudication. She is mildly overweight.  Past Medical History  Diagnosis Date  . CAD (coronary artery disease)   . HTN (hypertension)   . Hyperlipemia   . DM (diabetes mellitus)   . Chest pain   . Anxiety   . Myocardial infarction   . Sleep apnea   . Arthritis     shoulder and knees    Current Outpatient Prescriptions  Medication Sig Dispense Refill  . amLODipine (NORVASC) 10 MG tablet Take 1 tablet (10 mg total) by mouth daily.  60 tablet  3  . aspirin 81 MG tablet Take 81 mg by mouth daily.        . Calcium Carbonate-Vitamin D (CALTRATE 600+D) 600-400 MG-UNIT per tablet Take 1 tablet by mouth daily.      . carvedilol (COREG) 12.5 MG tablet Take 1 tablet (12.5 mg total) by mouth 2 (two) times daily with a meal.  60 tablet  3  . lisinopril-hydrochlorothiazide (PRINZIDE,ZESTORETIC) 20-25 MG per tablet Take 1 tablet by mouth daily.  30 tablet  5  . metFORMIN (GLUCOPHAGE) 500 MG tablet Take 1 tablet (500 mg total) by mouth 2 (two) times daily with a meal.  60 tablet  5  . nitroGLYCERIN (NITROSTAT) 0.4 MG SL tablet Place 1 tablet (0.4 mg total) under the tongue every 5 (five) minutes as needed.  30  tablet  5  . prasugrel (EFFIENT) 10 MG TABS tablet Take 1 tablet (10 mg total) by mouth daily.  90 tablet  3  . pravastatin (PRAVACHOL) 40 MG tablet Take 1 tablet (40 mg total) by mouth at bedtime.  30 tablet  5   No current facility-administered medications for this visit.    Allergies  Allergen Reactions  . Simvastatin     Family History  Problem Relation Age of Onset  . Anesthesia problems Neg Hx   . Hypotension Neg Hx   . Malignant hyperthermia Neg Hx   . Pseudochol deficiency Neg Hx     History   Social History  . Marital Status: Widowed    Spouse Name: N/A    Number of Children: N/A  . Years of Education: N/A   Occupational History  . Not on file.   Social History Main Topics  . Smoking status: Current Some Day Smoker -- 1.00 packs/day for 15 years    Types: Cigarettes  . Smokeless tobacco: Not on file  . Alcohol Use: Yes  . Drug Use: No  . Sexual Activity: Not Currently    Birth Control/ Protection: None   Other Topics Concern  . Not on file   Social History Narrative  .  No narrative on file    ROS ALL NEGATIVE EXCEPT THOSE NOTED IN HPI  PE  General Appearance: well developed, well nourished in no acute distress, mildly overweight HEENT: symmetrical face, PERRLA, good dentition  Neck: no JVD, thyromegaly, or adenopathy, trachea midline Chest: symmetric without deformity Cardiac: PMI non-displaced, RRR, normal S1, S2, no gallop or murmur Lung: clear to ausculation and percussion Vascular: all pulses full without bruits  Abdominal: nondistended, nontender, good bowel sounds, no HSM, no bruits Extremities: no cyanosis, clubbing or edema, no sign of DVT, no varicosities  Skin: normal color, no rashes Neuro: alert and oriented x 3, non-focal Pysch: normal affect  EKG Not repeated BMET    Component Value Date/Time   NA 139 01/11/2014 1022   K 4.2 01/11/2014 1022   CL 101 01/11/2014 1022   CO2 31 01/11/2014 1022   GLUCOSE 151* 01/11/2014 1022   BUN  17 01/11/2014 1022   CREATININE 1.04 01/11/2014 1022   CREATININE 1.06 05/25/2011 0116   CALCIUM 9.9 01/11/2014 1022   GFRNONAA 57* 01/11/2014 1022   GFRNONAA 53* 05/25/2011 0116   GFRAA 66 01/11/2014 1022   GFRAA >60 05/25/2011 0116    Lipid Panel     Component Value Date/Time   CHOL 123 01/11/2014 1022   TRIG 103 01/11/2014 1022   HDL 39* 01/11/2014 1022   CHOLHDL 3.2 01/11/2014 1022   VLDL 21 01/11/2014 1022   LDLCALC 63 01/11/2014 1022    CBC    Component Value Date/Time   WBC 5.9 01/11/2014 1022   RBC 4.55 01/11/2014 1022   HGB 14.7 01/11/2014 1022   HCT 41.9 01/11/2014 1022   PLT 253 01/11/2014 1022   MCV 92.1 01/11/2014 1022   MCH 32.3 01/11/2014 1022   MCHC 35.1 01/11/2014 1022   RDW 13.6 01/11/2014 1022   LYMPHSABS 2.8 01/11/2014 1022   MONOABS 0.5 01/11/2014 1022   EOSABS 0.2 01/11/2014 1022   BASOSABS 0.1 01/11/2014 1022

## 2014-03-22 NOTE — Assessment & Plan Note (Signed)
Stable. Continue current secondary preventative therapy. I've added pravastatin 20 mg by mouth each bedtime. She will need a lipid panel and LFTs in 6 weeks. I'll see her back in 6 months per her request. I've encouraged her to stop smoking.

## 2014-03-22 NOTE — Progress Notes (Signed)
Patient with history of hypertension, HLD, coronary artery disease, diabetes. Hx of NSTEMI with DES to LAD in 03/2011. Patient denies chest pain, shortness of breath, dizziness, headaches, and lower extremity swelling. Patient would like a script for Crestor as she indicates she was on it in the past.

## 2014-03-22 NOTE — Patient Instructions (Addendum)
You have been prescribed Pravastatin 20 mg daily. Please take it at night. This was sent to our pharmacy. Your test strips and lancets were refilled and sent to [facility]. Please return in 6 weeks for fasting labwork.

## 2014-03-28 ENCOUNTER — Telehealth: Payer: Self-pay | Admitting: *Deleted

## 2014-03-28 NOTE — Telephone Encounter (Signed)
Received a fax from [facility]. The pharmacy states they can not get the glucose blood (wavesense presto) test strips in. The pharmacy also had a question about the lancets micro thin 33G misc. The pharmacy wants to know which ones, how many times, and diagnosis code. Please contact [place] at [phone]. Alverda Skeans, RN

## 2014-03-29 ENCOUNTER — Other Ambulatory Visit: Payer: Self-pay | Admitting: Internal Medicine

## 2014-03-29 MED ORDER — ROSUVASTATIN CALCIUM 20 MG PO TABS: 20.0000 mg | ORAL_TABLET | Freq: Every day | ORAL | Status: DC

## 2014-03-29 NOTE — Telephone Encounter (Signed)
Please talk to patient and pharmacy to reconcile these issues. Thank you.

## 2014-04-28 ENCOUNTER — Telehealth: Payer: Self-pay

## 2014-04-28 NOTE — Telephone Encounter (Signed)
Placed call to patient to determine if glucose test strips, lancets needed or if she has refills. Called to determine if needed what glucometer she has at home.  Unable to reach patient, left voicemail requesting return call.

## 2014-05-04 ENCOUNTER — Ambulatory Visit: Payer: Self-pay | Attending: Internal Medicine

## 2014-05-04 DIAGNOSIS — E782 Mixed hyperlipidemia: Secondary | ICD-10-CM

## 2014-05-04 LAB — HEPATIC FUNCTION PANEL
ALT: 13 U/L (ref 0–35)
AST: 15 U/L (ref 0–37)
Albumin: 3.9 g/dL (ref 3.5–5.2)
Alkaline Phosphatase: 61 U/L (ref 39–117)
BILIRUBIN TOTAL: 0.7 mg/dL (ref 0.2–1.2)
Bilirubin, Direct: 0.1 mg/dL (ref 0.0–0.3)
Indirect Bilirubin: 0.6 mg/dL (ref 0.2–1.2)
Total Protein: 6.5 g/dL (ref 6.0–8.3)

## 2014-05-04 LAB — LIPID PANEL
CHOL/HDL RATIO: 3.1 ratio
CHOLESTEROL: 122 mg/dL (ref 0–200)
HDL: 39 mg/dL — ABNORMAL LOW (ref >39)
LDL CALC: 65 mg/dL (ref 0–99)
Triglycerides: 90 mg/dL (ref <150)
VLDL: 18 mg/dL (ref 0–40)

## 2014-05-10 ENCOUNTER — Telehealth: Payer: Self-pay

## 2014-05-10 NOTE — Telephone Encounter (Signed)
Patient informed of recent lab results. Patient appreciative of information and verbalized understanding.

## 2014-06-01 ENCOUNTER — Other Ambulatory Visit: Payer: Self-pay | Admitting: Internal Medicine

## 2014-06-01 DIAGNOSIS — I10 Essential (primary) hypertension: Secondary | ICD-10-CM

## 2014-06-09 ENCOUNTER — Other Ambulatory Visit: Payer: Self-pay | Admitting: Internal Medicine

## 2014-06-09 MED ORDER — ROSUVASTATIN CALCIUM 20 MG PO TABS: 20.0000 mg | ORAL_TABLET | Freq: Every day | ORAL | Status: DC

## 2014-06-15 ENCOUNTER — Other Ambulatory Visit: Payer: Self-pay | Admitting: Internal Medicine

## 2014-09-19 ENCOUNTER — Other Ambulatory Visit: Payer: Self-pay | Admitting: Internal Medicine

## 2014-09-21 ENCOUNTER — Encounter: Payer: Self-pay | Admitting: Internal Medicine

## 2014-09-21 ENCOUNTER — Ambulatory Visit: Payer: Self-pay | Attending: Internal Medicine | Admitting: Internal Medicine

## 2014-09-21 VITALS — BP 123/76 | HR 75 | Temp 98.5°F | Resp 16 | Wt 164.0 lb

## 2014-09-21 DIAGNOSIS — E785 Hyperlipidemia, unspecified: Secondary | ICD-10-CM | POA: Insufficient documentation

## 2014-09-21 DIAGNOSIS — E139 Other specified diabetes mellitus without complications: Secondary | ICD-10-CM

## 2014-09-21 DIAGNOSIS — Z7982 Long term (current) use of aspirin: Secondary | ICD-10-CM | POA: Insufficient documentation

## 2014-09-21 DIAGNOSIS — M269 Dentofacial anomaly, unspecified: Secondary | ICD-10-CM | POA: Insufficient documentation

## 2014-09-21 DIAGNOSIS — F17200 Nicotine dependence, unspecified, uncomplicated: Secondary | ICD-10-CM | POA: Insufficient documentation

## 2014-09-21 DIAGNOSIS — Z792 Long term (current) use of antibiotics: Secondary | ICD-10-CM | POA: Insufficient documentation

## 2014-09-21 DIAGNOSIS — J988 Other specified respiratory disorders: Secondary | ICD-10-CM | POA: Insufficient documentation

## 2014-09-21 DIAGNOSIS — K088 Other specified disorders of teeth and supporting structures: Secondary | ICD-10-CM | POA: Insufficient documentation

## 2014-09-21 DIAGNOSIS — Z7902 Long term (current) use of antithrombotics/antiplatelets: Secondary | ICD-10-CM | POA: Insufficient documentation

## 2014-09-21 DIAGNOSIS — E119 Type 2 diabetes mellitus without complications: Secondary | ICD-10-CM | POA: Insufficient documentation

## 2014-09-21 DIAGNOSIS — I2510 Atherosclerotic heart disease of native coronary artery without angina pectoris: Secondary | ICD-10-CM | POA: Insufficient documentation

## 2014-09-21 DIAGNOSIS — I252 Old myocardial infarction: Secondary | ICD-10-CM | POA: Insufficient documentation

## 2014-09-21 DIAGNOSIS — K009 Disorder of tooth development, unspecified: Secondary | ICD-10-CM

## 2014-09-21 DIAGNOSIS — I10 Essential (primary) hypertension: Secondary | ICD-10-CM | POA: Insufficient documentation

## 2014-09-21 LAB — GLUCOSE, POCT (MANUAL RESULT ENTRY): POC GLUCOSE: 179 mg/dL — AB (ref 70–99)

## 2014-09-21 LAB — POCT GLYCOSYLATED HEMOGLOBIN (HGB A1C): Hemoglobin A1C: 6.8

## 2014-09-21 MED ORDER — AMLODIPINE BESYLATE 10 MG PO TABS: 10.0000 mg | ORAL_TABLET | Freq: Every day | ORAL | Status: DC

## 2014-09-21 MED ORDER — CARVEDILOL 12.5 MG PO TABS: 12.5000 mg | ORAL_TABLET | Freq: Two times a day (BID) | ORAL | Status: DC

## 2014-09-21 MED ORDER — DOXYCYCLINE HYCLATE 100 MG PO TABS: 100.0000 mg | ORAL_TABLET | Freq: Two times a day (BID) | ORAL | Status: DC

## 2014-09-21 MED ORDER — PRASUGREL HCL 10 MG PO TABS: 10.0000 mg | ORAL_TABLET | Freq: Every day | ORAL | Status: DC

## 2014-09-21 MED ORDER — LISINOPRIL-HYDROCHLOROTHIAZIDE 20-25 MG PO TABS: 1.0000 | ORAL_TABLET | Freq: Every day | ORAL | Status: DC

## 2014-09-21 MED ORDER — ASPIRIN 81 MG PO TABS: 81.0000 mg | ORAL_TABLET | Freq: Every day | ORAL | Status: DC

## 2014-09-21 MED ORDER — ROSUVASTATIN CALCIUM 20 MG PO TABS: 20.0000 mg | ORAL_TABLET | Freq: Every day | ORAL | Status: DC

## 2014-09-21 MED ORDER — METFORMIN HCL 500 MG PO TABS: 500.0000 mg | ORAL_TABLET | Freq: Two times a day (BID) | ORAL | Status: DC

## 2014-09-21 NOTE — Patient Instructions (Signed)
DASH Eating Plan DASH stands for "Dietary Approaches to Stop Hypertension." The DASH eating plan is a healthy eating plan that has been shown to reduce high blood pressure (hypertension). Additional health benefits may include reducing the risk of type 2 diabetes mellitus, heart disease, and stroke. The DASH eating plan may also help with weight loss. WHAT DO I NEED TO KNOW ABOUT THE DASH EATING PLAN? For the DASH eating plan, you will follow these general guidelines:  Choose foods with a percent daily value for sodium of less than 5% (as listed on the food label).  Use salt-free seasonings or herbs instead of table salt or sea salt.  Check with your health care provider or pharmacist before using salt substitutes.  Eat lower-sodium products, often labeled as "lower sodium" or "no salt added."  Eat fresh foods.  Eat more vegetables, fruits, and low-fat dairy products.  Choose whole grains. Look for the word "whole" as the first word in the ingredient list.  Choose fish and skinless chicken or turkey more often than red meat. Limit fish, poultry, and meat to 6 oz (170 g) each day.  Limit sweets, desserts, sugars, and sugary drinks.  Choose heart-healthy fats.  Limit cheese to 1 oz (28 g) per day.  Eat more home-cooked food and less restaurant, buffet, and fast food.  Limit fried foods.  Cook foods using methods other than frying.  Limit canned vegetables. If you do use them, rinse them well to decrease the sodium.  When eating at a restaurant, ask that your food be prepared with less salt, or no salt if possible. WHAT FOODS CAN I EAT? Seek help from a dietitian for individual calorie needs. Grains Whole grain or whole wheat bread. Brown rice. Whole grain or whole wheat pasta. Quinoa, bulgur, and whole grain cereals. Low-sodium cereals. Corn or whole wheat flour tortillas. Whole grain cornbread. Whole grain crackers. Low-sodium crackers. Vegetables Fresh or frozen vegetables  (raw, steamed, roasted, or grilled). Low-sodium or reduced-sodium tomato and vegetable juices. Low-sodium or reduced-sodium tomato sauce and paste. Low-sodium or reduced-sodium canned vegetables.  Fruits All fresh, canned (in natural juice), or frozen fruits. Meat and Other Protein Products Ground beef (85% or leaner), grass-fed beef, or beef trimmed of fat. Skinless chicken or turkey. Ground chicken or turkey. Pork trimmed of fat. All fish and seafood. Eggs. Dried beans, peas, or lentils. Unsalted nuts and seeds. Unsalted canned beans. Dairy Low-fat dairy products, such as skim or 1% milk, 2% or reduced-fat cheeses, low-fat ricotta or cottage cheese, or plain low-fat yogurt. Low-sodium or reduced-sodium cheeses. Fats and Oils Tub margarines without trans fats. Light or reduced-fat mayonnaise and salad dressings (reduced sodium). Avocado. Safflower, olive, or canola oils. Natural peanut or almond butter. Other Unsalted popcorn and pretzels. The items listed above may not be a complete list of recommended foods or beverages. Contact your dietitian for more options. WHAT FOODS ARE NOT RECOMMENDED? Grains White bread. White pasta. White rice. Refined cornbread. Bagels and croissants. Crackers that contain trans fat. Vegetables Creamed or fried vegetables. Vegetables in a cheese sauce. Regular canned vegetables. Regular canned tomato sauce and paste. Regular tomato and vegetable juices. Fruits Dried fruits. Canned fruit in light or heavy syrup. Fruit juice. Meat and Other Protein Products Fatty cuts of meat. Ribs, chicken wings, bacon, sausage, bologna, salami, chitterlings, fatback, hot dogs, bratwurst, and packaged luncheon meats. Salted nuts and seeds. Canned beans with salt. Dairy Whole or 2% milk, cream, half-and-half, and cream cheese. Whole-fat or sweetened yogurt. Full-fat   cheeses or blue cheese. Nondairy creamers and whipped toppings. Processed cheese, cheese spreads, or cheese  curds. Condiments Onion and garlic salt, seasoned salt, table salt, and sea salt. Canned and packaged gravies. Worcestershire sauce. Tartar sauce. Barbecue sauce. Teriyaki sauce. Soy sauce, including reduced sodium. Steak sauce. Fish sauce. Oyster sauce. Cocktail sauce. Horseradish. Ketchup and mustard. Meat flavorings and tenderizers. Bouillon cubes. Hot sauce. Tabasco sauce. Marinades. Taco seasonings. Relishes. Fats and Oils Butter, stick margarine, lard, shortening, ghee, and bacon fat. Coconut, palm kernel, or palm oils. Regular salad dressings. Other Pickles and olives. Salted popcorn and pretzels. The items listed above may not be a complete list of foods and beverages to avoid. Contact your dietitian for more information. WHERE CAN I FIND MORE INFORMATION? National Heart, Lung, and Blood Institute: travelstabloid.com Document Released: 09/25/2011 Document Revised: 02/20/2014 Document Reviewed: 08/10/2013 [facility] Patient Information 2015 [place], [place]. This information is not intended to replace advice given to you by your health care provider. Make sure you discuss any questions you have with your health care provider. Hypertension Hypertension, commonly called high blood pressure, is when the force of blood pumping through your arteries is too strong. Your arteries are the blood vessels that carry blood from your heart throughout your body. A blood pressure reading consists of a higher number over a lower number, such as 110/72. The higher number (systolic) is the pressure inside your arteries when your heart pumps. The lower number (diastolic) is the pressure inside your arteries when your heart relaxes. Ideally you want your blood pressure below 120/80. Hypertension forces your heart to work harder to pump blood. Your arteries may become narrow or stiff. Having hypertension puts you at risk for heart disease, stroke, and other problems.  RISK  FACTORS Some risk factors for high blood pressure are controllable. Others are not.  Risk factors you cannot control include:   Race. You may be at higher risk if you are African American.  Age. Risk increases with age.  Gender. Men are at higher risk than women before age 37 years. After age 55, women are at higher risk than men. Risk factors you can control include:  Not getting enough exercise or physical activity.  Being overweight.  Getting too much fat, sugar, calories, or salt in your diet.  Drinking too much alcohol. SIGNS AND SYMPTOMS Hypertension does not usually cause signs or symptoms. Extremely high blood pressure (hypertensive crisis) may cause headache, anxiety, shortness of breath, and nosebleed. DIAGNOSIS  To check if you have hypertension, your health care provider will measure your blood pressure while you are seated, with your arm held at the level of your heart. It should be measured at least twice using the same arm. Certain conditions can cause a difference in blood pressure between your right and left arms. A blood pressure reading that is higher than normal on one occasion does not mean that you need treatment. If one blood pressure reading is high, ask your health care provider about having it checked again. TREATMENT  Treating high blood pressure includes making lifestyle changes and possibly taking medicine. Living a healthy lifestyle can help lower high blood pressure. You may need to change some of your habits. Lifestyle changes may include:  Following the DASH diet. This diet is high in fruits, vegetables, and whole grains. It is low in salt, red meat, and added sugars.  Getting at least 2 hours of brisk physical activity every week.  Losing weight if necessary.  Not smoking.  Limiting  alcoholic beverages.  Learning ways to reduce stress. If lifestyle changes are not enough to get your blood pressure under control, your health care provider may  prescribe medicine. You may need to take more than one. Work closely with your health care provider to understand the risks and benefits. HOME CARE INSTRUCTIONS  Have your blood pressure rechecked as directed by your health care provider.   Take medicines only as directed by your health care provider. Follow the directions carefully. Blood pressure medicines must be taken as prescribed. The medicine does not work as well when you skip doses. Skipping doses also puts you at risk for problems.   Do not smoke.   Monitor your blood pressure at home as directed by your health care provider. SEEK MEDICAL CARE IF:   You think you are having a reaction to medicines taken.  You have recurrent headaches or feel dizzy.  You have swelling in your ankles.  You have trouble with your vision. SEEK IMMEDIATE MEDICAL CARE IF:  You develop a severe headache or confusion.  You have unusual weakness, numbness, or feel faint.  You have severe chest or abdominal pain.  You vomit repeatedly.  You have trouble breathing. MAKE SURE YOU:   Understand these instructions.  Will watch your condition.  Will get help right away if you are not doing well or get worse. Document Released: 10/06/2005 Document Revised: 02/20/2014 Document Reviewed: 07/29/2013 [employer] Patient Information 2015 [employer]. This information is not intended to replace advice given to you by your health care provider. Make sure you discuss any questions you have with your health care provider. Diabetes and Exercise Exercising regularly is important. It is not just about losing weight. It has many health benefits, such as:  Improving your overall fitness, flexibility, and endurance.  Increasing your bone density.  Helping with weight control.  Decreasing your body fat.  Increasing your muscle strength.  Reducing stress and tension.  Improving your overall health. People with diabetes who exercise gain  additional benefits because exercise:  Reduces appetite.  Improves the body's use of blood sugar (glucose).  Helps lower or control blood glucose.  Decreases blood pressure.  Helps control blood lipids (such as cholesterol and triglycerides).  Improves the body's use of the hormone insulin by:  Increasing the body's insulin sensitivity.  Reducing the body's insulin needs.  Decreases the risk for heart disease because exercising:  Lowers cholesterol and triglycerides levels.  Increases the levels of good cholesterol (such as high-density lipoproteins [HDL]) in the body.  Lowers blood glucose levels. YOUR ACTIVITY PLAN  Choose an activity that you enjoy and set realistic goals. Your health care provider or diabetes educator can help you make an activity plan that works for you. Exercise regularly as directed by your health care provider. This includes:  Performing resistance training twice a week such as push-ups, sit-ups, lifting weights, or using resistance bands.  Performing 150 minutes of cardio exercises each week such as walking, running, or playing sports.  Staying active and spending no more than 90 minutes at one time being inactive. Even short bursts of exercise are good for you. Three 10-minute sessions spread throughout the day are just as beneficial as a single 30-minute session. Some exercise ideas include:  Taking the dog for a walk.  Taking the stairs instead of the elevator.  Dancing to your favorite song.  Doing an exercise video.  Doing your favorite exercise with a friend. RECOMMENDATIONS FOR EXERCISING WITH TYPE 1 OR   TYPE 2 DIABETES   Check your blood glucose before exercising. If blood glucose levels are greater than 240 mg/dL, check for urine ketones. Do not exercise if ketones are present.  Avoid injecting insulin into areas of the body that are going to be exercised. For example, avoid injecting insulin into:  The arms when playing  tennis.  The legs when jogging.  Keep a record of:  Food intake before and after you exercise.  Expected peak times of insulin action.  Blood glucose levels before and after you exercise.  The type and amount of exercise you have done.  Review your records with your health care provider. Your health care provider will help you to develop guidelines for adjusting food intake and insulin amounts before and after exercising.  If you take insulin or oral hypoglycemic agents, watch for signs and symptoms of hypoglycemia. They include:  Dizziness.  Shaking.  Sweating.  Chills.  Confusion.  Drink plenty of water while you exercise to prevent dehydration or heat stroke. Body water is lost during exercise and must be replaced.  Talk to your health care provider before starting an exercise program to make sure it is safe for you. Remember, almost any type of activity is better than none. Document Released: 12/27/2003 Document Revised: 02/20/2014 Document Reviewed: 03/15/2013 [facility] Patient Information 2015 [place], [place]. This information is not intended to replace advice given to you by your health care provider. Make sure you discuss any questions you have with your health care provider.

## 2014-09-21 NOTE — Progress Notes (Signed)
Patient here for follow up on her diabetes and HTN Here for her routine check up

## 2014-09-21 NOTE — Progress Notes (Signed)
Patient ID: JIAYI Graham, female   DOB: Mar 25, 1951, 63 y.o.   MRN: [id number]   Tammy Graham, is a 63 y.o. female  YQI:[id number]  [id number]  DOB - Jul 25, 1951  Chief Complaint  Patient presents with  . Follow-up        Subjective:   Tammy Graham is a 63 y.o. female here today for a follow up visit. Patient has history of hypertension, diabetes, dyslipidemia, coronary artery disease with previous MI and sleep apnea, here today for her routine follow-up. Her major complaint today include some upper respiratory symptoms with cough and runny nose, cough has being productive lately of brownish sputum. She denies chest pain. She has some loose teeth and would like to be referred to a dentist. She also needed refills on all her medications. She claims compliant with her medications and reports no side effects. Patient has No headache, No chest pain, No abdominal pain - No Nausea, No new weakness tingling or numbness, No Cough - SOB.  No problems updated.  ALLERGIES: Allergies  Allergen Reactions  . Simvastatin     PAST MEDICAL HISTORY: Past Medical History  Diagnosis Date  . CAD (coronary artery disease)   . HTN (hypertension)   . Hyperlipemia   . DM (diabetes mellitus)   . Chest pain   . Anxiety   . Myocardial infarction   . Sleep apnea   . Arthritis     shoulder and knees    MEDICATIONS AT HOME: Prior to Admission medications   Medication Sig Start Date End Date Taking? Authorizing Provider  amLODipine (NORVASC) 10 MG tablet Take 1 tablet (10 mg total) by mouth daily. 09/21/14   Tresa Garter, MD  aspirin 81 MG tablet Take 1 tablet (81 mg total) by mouth daily. 09/21/14   Tresa Garter, MD  Calcium Carbonate-Vitamin D (CALTRATE 600+D) 600-400 MG-UNIT per tablet Take 1 tablet by mouth daily.    Historical Provider, MD  carvedilol (COREG) 12.5 MG tablet Take 1 tablet (12.5 mg total) by mouth 2 (two) times daily with a meal. 09/21/14   Tresa Garter, MD    doxycycline (VIBRA-TABS) 100 MG tablet Take 1 tablet (100 mg total) by mouth 2 (two) times daily. 09/21/14   Tresa Garter, MD  glucose blood (WAVESENSE PRESTO) test strip Use as instructed 03/22/14   Renella Cunas, MD  LANCETS MICRO THIN 33G MISC Use as directed 03/22/14   Renella Cunas, MD  lisinopril-hydrochlorothiazide (PRINZIDE,ZESTORETIC) 20-25 MG per tablet Take 1 tablet by mouth daily. 09/21/14   Tresa Garter, MD  metFORMIN (GLUCOPHAGE) 500 MG tablet Take 1 tablet (500 mg total) by mouth 2 (two) times daily with a meal. 09/21/14   Tresa Garter, MD  nitroGLYCERIN (NITROSTAT) 0.4 MG SL tablet Place 1 tablet (0.4 mg total) under the tongue every 5 (five) minutes as needed. 01/07/14   Reyne Dumas, MD  prasugrel (EFFIENT) 10 MG TABS tablet Take 1 tablet (10 mg total) by mouth daily. 09/21/14   Tresa Garter, MD  rosuvastatin (CRESTOR) 20 MG tablet Take 1 tablet (20 mg total) by mouth daily. 09/21/14   Tresa Garter, MD     Objective:   Filed Vitals:   09/21/14 0956  BP: 123/76  Pulse: 75  Temp: 98.5 F (36.9 C)  Resp: 16  Weight: 164 lb (74.39 kg)  SpO2: 100%    Exam General appearance : Awake, alert, not in any distress. Speech Clear. Not toxic looking HEENT: Atraumatic  and Normocephalic, pupils equally reactive to light and accomodation Neck: supple, no JVD. No cervical lymphadenopathy.  Chest:Good air entry bilaterally, no added sounds  CVS: S1 S2 regular, no murmurs.  Abdomen: Bowel sounds present, Non tender and not distended with no gaurding, rigidity or rebound. Extremities: B/L Lower Ext shows no edema, both legs are warm to touch Neurology: Awake alert, and oriented X 3, CN II-XII intact, Non focal Skin:No Rash Wounds:N/A  Data Review Lab Results  Component Value Date   HGBA1C 6.8 09/21/2014   HGBA1C 6.5% 01/07/2014   HGBA1C 7.5* 04/07/2011     Assessment & Plan   1. Other specified diabetes mellitus without complications  -  Glucose (CBG) - HgB A1c - metFORMIN (GLUCOPHAGE) 500 MG tablet; Take 1 tablet (500 mg total) by mouth 2 (two) times daily with a meal.  Dispense: 180 tablet; Refill: 3  - Ambulatory referral to Ophthalmology for diabetic retinopathy screening  2. Essential hypertension  - amLODipine (NORVASC) 10 MG tablet; Take 1 tablet (10 mg total) by mouth daily.  Dispense: 90 tablet; Refill: 3 - aspirin 81 MG tablet; Take 1 tablet (81 mg total) by mouth daily.  Dispense: 90 tablet; Refill: 3 - carvedilol (COREG) 12.5 MG tablet; Take 1 tablet (12.5 mg total) by mouth 2 (two) times daily with a meal.  Dispense: 180 tablet; Refill: 3 - lisinopril-hydrochlorothiazide (PRINZIDE,ZESTORETIC) 20-25 MG per tablet; Take 1 tablet by mouth daily.  Dispense: 90 tablet; Refill: 3 - prasugrel (EFFIENT) 10 MG TABS tablet; Take 1 tablet (10 mg total) by mouth daily.  Dispense: 90 tablet; Refill: 3  3. Dyslipidemia  - rosuvastatin (CRESTOR) 20 MG tablet; Take 1 tablet (20 mg total) by mouth daily.  Dispense: 90 tablet; Refill: 3  4. Dental anomaly  - Ambulatory referral to Dentistry  5. Respiratory infection   - doxycycline (VIBRA-TABS) 100 MG tablet; Take 1 tablet (100 mg total) by mouth 2 (two) times daily.  Dispense: 10 tablet; Refill: 0  Patient was extensively counseled about nutrition and exercise  Patient was counseled extensively to Quit smoking  Return in about 3 months (around 12/21/2014) for Hemoglobin A1C and Follow up, DM, Follow up HTN.  The patient was given clear instructions to go to ER or return to medical center if symptoms don't improve, worsen or new problems develop. The patient verbalized understanding. The patient was told to call to get lab results if they haven't heard anything in the next week.   This note has been created with Surveyor, quantity. Any transcriptional errors are unintentional.    Angelica Chessman, MD, [place], [place], [place] and [place], [place]   09/21/2014, 11:01 AM

## 2014-10-24 ENCOUNTER — Ambulatory Visit: Payer: Self-pay | Attending: Internal Medicine

## 2014-10-24 ENCOUNTER — Other Ambulatory Visit: Payer: Self-pay

## 2014-10-24 DIAGNOSIS — I10 Essential (primary) hypertension: Secondary | ICD-10-CM

## 2015-01-15 ENCOUNTER — Emergency Department (INDEPENDENT_AMBULATORY_CARE_PROVIDER_SITE_OTHER)
Admission: EM | Admit: 2015-01-15 | Discharge: 2015-01-15 | Disposition: A | Discharge status: Home / Self Care | Payer: Self-pay | Source: Home / Self Care | Attending: Emergency Medicine | Admitting: Emergency Medicine

## 2015-01-15 ENCOUNTER — Encounter (HOSPITAL_COMMUNITY): Payer: Self-pay | Admitting: Emergency Medicine

## 2015-01-15 DIAGNOSIS — M79609 Pain in unspecified limb: Secondary | ICD-10-CM

## 2015-01-15 DIAGNOSIS — R202 Paresthesia of skin: Secondary | ICD-10-CM

## 2015-01-15 DIAGNOSIS — M545 Low back pain, unspecified: Secondary | ICD-10-CM

## 2015-01-15 DIAGNOSIS — W1809XA Striking against other object with subsequent fall, initial encounter: Secondary | ICD-10-CM

## 2015-01-15 NOTE — ED Provider Notes (Signed)
CSN: [id number]     Arrival date & time 01/15/15  1649 History   First MD Initiated Contact with Patient 01/15/15 1908     Chief Complaint  Patient presents with  . Fall   (Consider location/radiation/quality/duration/timing/severity/associated sxs/prior Treatment) HPI Comments: 64 year old patient was in a shopping center when on the other side of the aisle somewhat had knocked over some luggage. Leg is fail alter her and around her and she fell into the luggage. She is complaining of pain across her lower back and mid back. She is also complaining of an occasional, intermittent numbness to her right lower extremity and occasional throbbing. She is fully awake and alert and oriented. She did not injure her head or neck. She did not lose consciousness. She denies problems with vision, speech, hearing, swallowing, focal paresthesias or weakness. She says her is some pain across her lower back and nothing seems to make it worse or better. It causes her to have a slight limp with the right leg. Otherwise she states she feels fine. Due to her medical history of CAD, hypertension, MI, type 2 diabetes mellitus, hypertension she is taking Effient. She also smokes daily. She has seen no bleeding or unusual swelling.  Patient is a 64 y.o. female presenting with fall.  Fall Pertinent negatives include no chest pain, no headaches and no shortness of breath.    Past Medical History  Diagnosis Date  . CAD (coronary artery disease)   . HTN (hypertension)   . Hyperlipemia   . DM (diabetes mellitus)   . Chest pain   . Anxiety   . Myocardial infarction   . Sleep apnea   . Arthritis     shoulder and knees   Past Surgical History  Procedure Laterality Date  . Left knee surgery    . Cardiac catheterization    . Coronary stent placement    . Colonoscopy    . Colonoscopy  09/25/2011    Procedure: COLONOSCOPY;  Surgeon: Winfield Cunas., MD;  Location: [facility];  Service: Endoscopy;  Laterality:  N/A;   Family History  Problem Relation Age of Onset  . Anesthesia problems Neg Hx   . Hypotension Neg Hx   . Malignant hyperthermia Neg Hx   . Pseudochol deficiency Neg Hx    History  Substance Use Topics  . Smoking status: Current Some Day Smoker -- 1.00 packs/day for 15 years    Types: Cigarettes  . Smokeless tobacco: Not on file  . Alcohol Use: Yes   OB History    No data available     Review of Systems  Constitutional: Positive for activity change. Negative for fever and fatigue.  HENT: Negative.   Respiratory: Negative for cough, shortness of breath and wheezing.   Cardiovascular: Negative for chest pain, palpitations and leg swelling.  Gastrointestinal: Negative.   Genitourinary: Negative.   Musculoskeletal: Positive for back pain and neck pain.       Complains of soreness to the posterior neck musculature and bilateral trapezii.  Skin: Negative.   Neurological: Positive for numbness. Negative for dizziness, tremors, syncope, facial asymmetry, speech difficulty and headaches.  Psychiatric/Behavioral: Negative.     Allergies  Benadryl and Simvastatin  Home Medications   Prior to Admission medications   Medication Sig Start Date End Date Taking? Authorizing Provider  amLODipine (NORVASC) 10 MG tablet Take 1 tablet (10 mg total) by mouth daily. 09/21/14   Tresa Garter, MD  aspirin 81 MG tablet Take 1 tablet (  81 mg total) by mouth daily. 09/21/14   Tresa Garter, MD  Calcium Carbonate-Vitamin D (CALTRATE 600+D) 600-400 MG-UNIT per tablet Take 1 tablet by mouth daily.    Historical Provider, MD  carvedilol (COREG) 12.5 MG tablet Take 1 tablet (12.5 mg total) by mouth 2 (two) times daily with a meal. 09/21/14   Tresa Garter, MD  doxycycline (VIBRA-TABS) 100 MG tablet Take 1 tablet (100 mg total) by mouth 2 (two) times daily. 09/21/14   Tresa Garter, MD  glucose blood (WAVESENSE PRESTO) test strip Use as instructed 03/22/14   Renella Cunas, MD   LANCETS MICRO THIN 33G MISC Use as directed 03/22/14   Renella Cunas, MD  lisinopril-hydrochlorothiazide (PRINZIDE,ZESTORETIC) 20-25 MG per tablet Take 1 tablet by mouth daily. 09/21/14   Tresa Garter, MD  metFORMIN (GLUCOPHAGE) 500 MG tablet Take 1 tablet (500 mg total) by mouth 2 (two) times daily with a meal. 09/21/14   Tresa Garter, MD  nitroGLYCERIN (NITROSTAT) 0.4 MG SL tablet Place 1 tablet (0.4 mg total) under the tongue every 5 (five) minutes as needed. 01/07/14   Reyne Dumas, MD  prasugrel (EFFIENT) 10 MG TABS tablet Take 1 tablet (10 mg total) by mouth daily. 09/21/14   Tresa Garter, MD  rosuvastatin (CRESTOR) 20 MG tablet Take 1 tablet (20 mg total) by mouth daily. 09/21/14   Tresa Garter, MD   BP 124/74 mmHg  Pulse 75  Temp(Src) 98.1 F (36.7 C) (Oral)  Resp 16  SpO2 95% Physical Exam  Constitutional: She is oriented to person, place, and time. She appears well-developed and well-nourished. No distress.  HENT:  Mouth/Throat: Oropharynx is clear and moist. No oropharyngeal exudate.  Eyes: Conjunctivae and EOM are normal. Pupils are equal, round, and reactive to light.  Neck: Normal range of motion. Neck supple.  Minor soreness over the splenius capitis and trapezii. Full range of motion of the neck without pain.  Cardiovascular: Normal rate, regular rhythm, normal heart sounds and intact distal pulses.   Pulmonary/Chest: Effort normal and breath sounds normal. No respiratory distress. She has no wheezes. She has no rales. She exhibits tenderness.  Abdominal: Soft. There is no tenderness.  Musculoskeletal: Normal range of motion. She exhibits no edema or tenderness.  Patient strength in lower extremities is symmetric and 4 over 5. Extension is complete. She is able to stand and walk heel to toe without losing balance. No tenderness or swelling to the lower extremities. Normal temperature. Distal neurovascular is intact. Pedal pulses 1+. Spinal examination  reveals no discoloration, ecchymosis, deformity or swelling. No palpable or observed deformity of the spine. No step-off deformity.  Lymphadenopathy:    She has no cervical adenopathy.  Neurological: She is alert and oriented to person, place, and time. She exhibits normal muscle tone.  Skin: Skin is warm and dry.  Psychiatric: She has a normal mood and affect.  Nursing note and vitals reviewed.   ED Course  Procedures (including critical care time) Labs Review Labs Reviewed - No data to display  Imaging Review No results found.   MDM   1. Fall against object, initial encounter   2. Bilateral low back pain without sciatica   3. Paresthesia and pain of right extremity    Patient's past medical history and medications been reviewed. Today is the fourth day status post the fall and she states she is in general she is feeling well. She is alert and oriented. Speech is clear and  lucid. She shows no sign of bleeding or neurologic injury. She may take Tylenol for muscle soreness but no NSAIDs. Follow-up with your PCP later this week.    Janne Napoleon, NP 01/15/15 1931  Janne Napoleon, NP 01/15/15 [phone]

## 2015-01-15 NOTE — Discharge Instructions (Signed)
Back Pain, Adult Low back pain is very common. About 1 in 5 people have back pain.The cause of low back pain is rarely dangerous. The pain often gets better over time.About half of people with a sudden onset of back pain feel better in just 2 weeks. About 8 in 10 people feel better by 6 weeks.  CAUSES Some common causes of back pain include:  Strain of the muscles or ligaments supporting the spine.  Wear and tear (degeneration) of the spinal discs.  Arthritis.  Direct injury to the back. DIAGNOSIS Most of the time, the direct cause of low back pain is not known.However, back pain can be treated effectively even when the exact cause of the pain is unknown.Answering your caregiver's questions about your overall health and symptoms is one of the most accurate ways to make sure the cause of your pain is not dangerous. If your caregiver needs more information, he or she may order lab work or imaging tests (X-rays or MRIs).However, even if imaging tests show changes in your back, this usually does not require surgery. HOME CARE INSTRUCTIONS For many people, back pain returns.Since low back pain is rarely dangerous, it is often a condition that people can learn to [facility] their own.   Remain active. It is stressful on the back to sit or stand in one place. Do not sit, drive, or stand in one place for more than 30 minutes at a time. Take short walks on level surfaces as soon as pain allows.Try to increase the length of time you walk each day.  Do not stay in bed.Resting more than 1 or 2 days can delay your recovery.  Do not avoid exercise or work.Your body is made to move.It is not dangerous to be active, even though your back may hurt.Your back will likely heal faster if you return to being active before your pain is gone.  Pay attention to your body when you bend and lift. Many people have less discomfortwhen lifting if they bend their knees, keep the load close to their bodies,and  avoid twisting. Often, the most comfortable positions are those that put less stress on your recovering back.  Find a comfortable position to sleep. Use a firm mattress and lie on your side with your knees slightly bent. If you lie on your back, put a pillow under your knees.  Only take over-the-counter or prescription medicines as directed by your caregiver. Over-the-counter medicines to reduce pain and inflammation are often the most helpful.Your caregiver may prescribe muscle relaxant drugs.These medicines help dull your pain so you can more quickly return to your normal activities and healthy exercise.  Put ice on the injured area.  Put ice in a plastic bag.  Place a towel between your skin and the bag.  Leave the ice on for 15-20 minutes, 03-04 times a day for the first 2 to 3 days. After that, ice and heat may be alternated to reduce pain and spasms.  Ask your caregiver about trying back exercises and gentle massage. This may be of some benefit.  Avoid feeling anxious or stressed.Stress increases muscle tension and can worsen back pain.It is important to recognize when you are anxious or stressed and learn ways to manage it.Exercise is a great option. SEEK MEDICAL CARE IF:  You have pain that is not relieved with rest or medicine.  You have pain that does not improve in 1 week.  You have new symptoms.  You are generally not feeling well. SEEK  IMMEDIATE MEDICAL CARE IF:   You have pain that radiates from your back into your legs.  You develop new bowel or bladder control problems.  You have unusual weakness or numbness in your arms or legs.  You develop nausea or vomiting.  You develop abdominal pain.  You feel faint. Document Released: 10/06/2005 Document Revised: 04/06/2012 Document Reviewed: 02/07/2014 [facility] Patient Information 2015 [place], [place]. This information is not intended to replace advice given to you by your health care provider. Make sure you  discuss any questions you have with your health care provider.  Musculoskeletal Pain Musculoskeletal pain is muscle and boney aches and pains. These pains can occur in any part of the body. Your caregiver may treat you without knowing the cause of the pain. They may treat you if blood or urine tests, X-rays, and other tests were normal.  CAUSES There is often not a definite cause or reason for these pains. These pains may be caused by a type of germ (virus). The discomfort may also come from overuse. Overuse includes working out too hard when your body is not fit. Boney aches also come from weather changes. Bone is sensitive to atmospheric pressure changes. HOME CARE INSTRUCTIONS   Ask when your test results will be ready. Make sure you get your test results.  Only take over-the-counter or prescription medicines for pain, discomfort, or fever as directed by your caregiver. If you were given medications for your condition, do not drive, operate machinery or power tools, or sign legal documents for 24 hours. Do not drink alcohol. Do not take sleeping pills or other medications that may interfere with treatment.  Continue all activities unless the activities cause more pain. When the pain lessens, slowly resume normal activities. Gradually increase the intensity and duration of the activities or exercise.  During periods of severe pain, bed rest may be helpful. Lay or sit in any position that is comfortable.  Putting ice on the injured area.  Put ice in a bag.  Place a towel between your skin and the bag.  Leave the ice on for 15 to 20 minutes, 3 to 4 times a day.  Follow up with your caregiver for continued problems and no reason can be found for the pain. If the pain becomes worse or does not go away, it may be necessary to repeat tests or do additional testing. Your caregiver may need to look further for a possible cause. SEEK IMMEDIATE MEDICAL CARE IF:  You have pain that is getting worse  and is not relieved by medications.  You develop chest pain that is associated with shortness or breath, sweating, feeling sick to your stomach (nauseous), or throw up (vomit).  Your pain becomes localized to the abdomen.  You develop any new symptoms that seem different or that concern you. MAKE SURE YOU:   Understand these instructions.  Will watch your condition.  Will get help right away if you are not doing well or get worse. Document Released: 10/06/2005 Document Revised: 12/29/2011 Document Reviewed: 06/10/2013 [facility] Patient Information 2015 [place], [place]. This information is not intended to replace advice given to you by your health care provider. Make sure you discuss any questions you have with your health care provider.  Neuropathic Pain We often think that pain has a physical cause. If we get rid of the cause, the pain should go away. Nerves themselves can also cause pain. It is called neuropathic pain, which means nerve abnormality. It may be difficult for  the patients who have it and for the treating caregivers. Pain is usually described as acute (short-lived) or chronic (long-lasting). Acute pain is related to the physical sensations caused by an injury. It can last from a few seconds to many weeks, but it usually goes away when normal healing occurs. Chronic pain lasts beyond the typical healing time. With neuropathic pain, the nerve fibers themselves may be damaged or injured. They then send incorrect signals to other pain centers. The pain you feel is real, but the cause is not easy to find.  CAUSES  Chronic pain can result from diseases, such as diabetes and shingles (an infection related to chickenpox), or from trauma, surgery, or amputation. It can also happen without any known injury or disease. The nerves are sending pain messages, even though there is no identifiable cause for such messages.   Other common causes of neuropathy include diabetes, phantom limb pain, or  Regional Pain Syndrome (RPS).  As with all forms of chronic back pain, if neuropathy is not correctly treated, there can be a number of associated problems that lead to a downward cycle for the patient. These include depression, sleeplessness, feelings of fear and anxiety, limited social interaction and inability to do normal daily activities or work.  The most dramatic and mysterious example of neuropathic pain is called "phantom limb syndrome." This occurs when an arm or a leg has been removed because of illness or injury. The brain still gets pain messages from the nerves that originally carried impulses from the missing limb. These nerves now seem to misfire and cause troubling pain.  Neuropathic pain often seems to have no cause. It responds poorly to standard pain treatment. Neuropathic pain can occur after:  Shingles (herpes zoster virus infection).  A lasting burning sensation of the skin, caused usually by injury to a peripheral nerve.  Peripheral neuropathy which is widespread nerve damage, often caused by diabetes or alcoholism.  Phantom limb pain following an amputation.  Facial nerve problems (trigeminal neuralgia).  Multiple sclerosis.  Reflex sympathetic dystrophy.  Pain which comes with cancer and cancer chemotherapy.  Entrapment neuropathy such as when pressure is put on a nerve such as in carpal tunnel syndrome.  Back, leg, and hip problems (sciatica).  Spine or back surgery.  HIV Infection or AIDS where nerves are infected by viruses. Your caregiver can explain items in the above list which may apply to you. SYMPTOMS  Characteristics of neuropathic pain are:  Severe, sharp, electric shock-like, shooting, lightening-like, knife-like.  Pins and needles sensation.  Deep burning, deep cold, or deep ache.  Persistent numbness, tingling, or weakness.  Pain resulting from light touch or other stimulus that would not usually cause pain.  Increased sensitivity  to something that would normally cause pain, such as a pinprick. Pain may persist for months or years following the healing of damaged tissues. When this happens, pain signals no longer sound an alarm about current injuries or injuries about to happen. Instead, the alarm system itself is not working correctly.  Neuropathic pain may get worse instead of better over time. For some people, it can lead to serious disability. It is important to be aware that severe injury in a limb can occur without a proper, protective pain response.Burns, cuts, and other injuries may go unnoticed. Without proper treatment, these injuries can become infected or lead to further disability. Take any injury seriously, and consult your caregiver for treatment. DIAGNOSIS  When you have a pain with no known cause,  your caregiver will probably ask some specific questions:   Do you have any other conditions, such as diabetes, shingles, multiple sclerosis, or HIV infection?  How would you describe your pain? (Neuropathic pain is often described as shooting, stabbing, burning, or searing.)  Is your pain worse at any time of the day? (Neuropathic pain is usually worse at night.)  Does the pain seem to follow a certain physical pathway?  Does the pain come from an area that has missing or injured nerves? (An example would be phantom limb pain.)  Is the pain triggered by minor things such as rubbing against the sheets at night? These questions often help define the type of pain involved. Once your caregiver knows what is happening, treatment can begin. Anticonvulsant, antidepressant drugs, and various pain relievers seem to work in some cases. If another condition, such as diabetes is involved, better management of that disorder may relieve the neuropathic pain.  TREATMENT  Neuropathic pain is frequently long-lasting and tends not to respond to treatment with narcotic type pain medication. It may respond well to other drugs such  as antiseizure and antidepressant medications. Usually, neuropathic problems do not completely go away, but partial improvement is often possible with proper treatment. Your caregivers have large numbers of medications available to treat you. Do not be discouraged if you do not get immediate relief. Sometimes different medications or a combination of medications will be tried before you receive the results you are hoping for. See your caregiver if you have pain that seems to be coming from nowhere and does not go away. Help is available.  SEEK IMMEDIATE MEDICAL CARE IF:   There is a sudden change in the quality of your pain, especially if the change is on only one side of the body.  You notice changes of the skin, such as redness, black or purple discoloration, swelling, or an ulcer.  You cannot move the affected limbs. Document Released: 07/03/2004 Document Revised: 12/29/2011 Document Reviewed: 07/03/2004 [facility] Patient Information 2015 [place], [place]. This information is not intended to replace advice given to you by your health care provider. Make sure you discuss any questions you have with your health care provider.

## 2015-01-15 NOTE — ED Notes (Signed)
Reports luggage fell on patient, then patient fell to the ground.  Patient has low back pain and pain into both legs. Incident occurred on Thursday 3/24.  Reports pelvis pain.  Feels like "something shifted"

## 2015-02-01 ENCOUNTER — Ambulatory Visit: Payer: Self-pay | Admitting: Internal Medicine

## 2015-02-01 ENCOUNTER — Ambulatory Visit: Payer: Self-pay | Attending: Internal Medicine | Admitting: Physician Assistant

## 2015-02-01 VITALS — BP 108/71 | HR 79 | Temp 98.6°F | Resp 16

## 2015-02-01 DIAGNOSIS — M5441 Lumbago with sciatica, right side: Secondary | ICD-10-CM

## 2015-02-01 MED ORDER — ACETAMINOPHEN-CODEINE #3 300-30 MG PO TABS: 1.0000 | ORAL_TABLET | ORAL | Status: DC | PRN

## 2015-02-01 NOTE — Patient Instructions (Signed)
Work on exercises to strengthen your back Keep walking Keep appt with Dr. Doreene Burke 4/21

## 2015-02-01 NOTE — Progress Notes (Signed)
Chief Complaint: Recent fall  Subjective: This is a 64 year old female with multiple chronic medical issues. She recently suffered a fall at [facility]. She was in the low department and someone reached up to grab luggage and DOWN on top of her. She landed on her back. She also sustained injuries to bilateral knees because the letters fell directly on her. Her feet tangled in some of the bags as very. She lost her balance.  She went to urgent care on March 28. They recommended over-the-counter Tylenol. She gets temporary relief from this. She also was instructed to see her primary care provider within a week.  Since that time she's been very slow to get up. She also has to care for a paralyzed daughter. She's been given difficulties caring for her daughter secondary to her injuries. She also feels that sometimes she thinks that something has been "jarred a loose". He states that she's losing control of her bowels as well since the injury.  ROS:  GEN: denies fever or chills, denies change in weight LUNGS: denies SHOB, dyspnea, PND, orthopnea CV: denies CP or palpitations EXT: denies muscle spasms or swelling; + pain in lower ext, no weakness NEURO: denies numbness or tingling, denies sz, stroke or TIA   Objective:  Filed Vitals:   02/01/15 0956  BP: 108/71  Pulse: 79  Temp: 98.6 F (37 C)  TempSrc: Oral  Resp: 16  SpO2: 98%    Physical Exam:  General: in no acute distress. Heart: Normal  s1 &s2  Regular rate and rhythm, without murmurs, rubs, gallops. Lungs: Clear to auscultation bilaterally. Extremities: No clubbing cyanosis or edema with positive pedal pulses; decreased ROM in low back; no swelling in knees Neuro: Alert, awake, oriented x3, nonfocal.  Pertinent Lab Results:none   Medications: Prior to Admission medications   Medication Sig Start Date End Date Taking? Authorizing Provider  amLODipine (NORVASC) 10 MG tablet Take 1 tablet (10 mg total) by mouth daily. 09/21/14   Yes Tresa Garter, MD  aspirin 81 MG tablet Take 1 tablet (81 mg total) by mouth daily. 09/21/14  Yes Tresa Garter, MD  Calcium Carbonate-Vitamin D (CALTRATE 600+D) 600-400 MG-UNIT per tablet Take 1 tablet by mouth daily.   Yes Historical Provider, MD  carvedilol (COREG) 12.5 MG tablet Take 1 tablet (12.5 mg total) by mouth 2 (two) times daily with a meal. 09/21/14  Yes Tresa Garter, MD  glucose blood (WAVESENSE PRESTO) test strip Use as instructed 03/22/14  Yes Renella Cunas, MD  LANCETS MICRO THIN 33G MISC Use as directed 03/22/14  Yes Renella Cunas, MD  lisinopril-hydrochlorothiazide (PRINZIDE,ZESTORETIC) 20-25 MG per tablet Take 1 tablet by mouth daily. 09/21/14  Yes Tresa Garter, MD  metFORMIN (GLUCOPHAGE) 500 MG tablet Take 1 tablet (500 mg total) by mouth 2 (two) times daily with a meal. 09/21/14  Yes Tresa Garter, MD  nitroGLYCERIN (NITROSTAT) 0.4 MG SL tablet Place 1 tablet (0.4 mg total) under the tongue every 5 (five) minutes as needed. 01/07/14  Yes Reyne Dumas, MD  prasugrel (EFFIENT) 10 MG TABS tablet Take 1 tablet (10 mg total) by mouth daily. 09/21/14  Yes Tresa Garter, MD  rosuvastatin (CRESTOR) 20 MG tablet Take 1 tablet (20 mg total) by mouth daily. 09/21/14  Yes Tresa Garter, MD  acetaminophen-codeine (TYLENOL #3) 300-30 MG per tablet Take 1 tablet by mouth every 4 (four) hours as needed for moderate pain. 02/01/15   Brayton Caves, PA-C  doxycycline (VIBRA-TABS) 100 MG tablet Take 1 tablet (100 mg total) by mouth 2 (two) times daily. Patient not taking: Reported on 02/01/2015 09/21/14   Tresa Garter, MD    Assessment: 1. S/p fall with injury to right lower back and bilateral knees   Plan: PRN pain medications (Tylenol #3 Joint strengthening exercises Moist heat Keep appointment with Dr.Jegede April 21 at 5:00 PM to discuss whether further imaging studies may be needed  Follow up:as scheduled  The patient was given clear  instructions to go to ER or return to medical center if symptoms don't improve, worsen or new problems develop. The patient verbalized understanding. The patient was told to call to get lab results if they haven't heard anything in the next week.   This note has been created with Surveyor, quantity. Any transcriptional errors are unintentional.   Zettie Pho, PA-C 02/01/2015, 10:41 AM

## 2015-02-01 NOTE — Progress Notes (Signed)
Complaining of Pain on rt side of torso due to injury two weeks ago

## 2015-02-08 ENCOUNTER — Encounter: Payer: Self-pay | Admitting: Internal Medicine

## 2015-02-08 ENCOUNTER — Ambulatory Visit: Payer: Self-pay | Attending: Internal Medicine | Admitting: Internal Medicine

## 2015-02-08 DIAGNOSIS — E119 Type 2 diabetes mellitus without complications: Secondary | ICD-10-CM

## 2015-02-08 DIAGNOSIS — M25552 Pain in left hip: Secondary | ICD-10-CM

## 2015-02-08 DIAGNOSIS — I10 Essential (primary) hypertension: Secondary | ICD-10-CM

## 2015-02-08 DIAGNOSIS — M25559 Pain in unspecified hip: Secondary | ICD-10-CM | POA: Insufficient documentation

## 2015-02-08 LAB — GLUCOSE, POCT (MANUAL RESULT ENTRY): POC Glucose: 131 mg/dl — AB (ref 70–99)

## 2015-02-08 LAB — POCT GLYCOSYLATED HEMOGLOBIN (HGB A1C): HEMOGLOBIN A1C: 7.5

## 2015-02-08 MED ORDER — NITROGLYCERIN 0.4 MG SL SUBL: 0.4000 mg | SUBLINGUAL_TABLET | SUBLINGUAL | Status: DC | PRN

## 2015-02-08 MED ORDER — DICLOFENAC SODIUM 1 % TD GEL: 2.0000 g | Freq: Four times a day (QID) | TRANSDERMAL | Status: DC

## 2015-02-08 NOTE — Progress Notes (Signed)
Patient had a fall about a month ago and went to Urgent Care.  They told her to use ice to reduce swelling.  She came to see Dr. Jarold Song recently who gave her a Rx for Tylenol 3 but she did not continue taking it because she did not like the way it made her feel.  Patient needs refill on her nitroglycerin

## 2015-02-08 NOTE — Progress Notes (Signed)
Patient ID: Tammy Graham, female   DOB: April 15, 1951, 64 y.o.   MRN: [id number]   Tammy Graham, is a 64 y.o. female  [id number]:[id number]  [id number]  DOB - [birth year]  Chief Complaint  Patient presents with  . right sided pain from fall        Subjective:   Tammy Graham is a 64 y.o. female here today for a follow up visit. Patient has history of coronary artery disease, hypertension, hyperlipidemia, diabetes mellitus, generalized anxiety and sleep apnea. Patient had a fall about a month ago and went to Urgent Care. They told her to use ice to reduce swelling. She came to see Dr. Jarold Song recently who gave her a Rx for Tylenol #3 but she did not continue taking it because she did not like the way it made her feel. She says she feels much better but she feels some difficulties because she takes care of her paralyzed daughter and getting up is more difficult since after the fall. She needs a different medication for pain. Patient needs refill on her nitroglycerin. Patient has No headache, No chest pain, No abdominal pain - No Nausea, No new weakness tingling or numbness, No Cough - SOB.  Problem  Hip Pain    ALLERGIES: Allergies  Allergen Reactions  . Benadryl [Diphenhydramine Hcl (Sleep)]   . Simvastatin     PAST MEDICAL HISTORY: Past Medical History  Diagnosis Date  . CAD (coronary artery disease)   . HTN (hypertension)   . Hyperlipemia   . DM (diabetes mellitus)   . Chest pain   . Anxiety   . Myocardial infarction   . Sleep apnea   . Arthritis     shoulder and knees    MEDICATIONS AT HOME: Prior to Admission medications   Medication Sig Start Date End Date Taking? Authorizing Provider  acetaminophen-codeine (TYLENOL #3) 300-30 MG per tablet Take 1 tablet by mouth every 4 (four) hours as needed for moderate pain. 02/01/15  Yes Tiffany Daneil Dan, PA-C  amLODipine (NORVASC) 10 MG tablet Take 1 tablet (10 mg total) by mouth daily. 09/21/14  Yes Tresa Garter, MD  aspirin  81 MG tablet Take 1 tablet (81 mg total) by mouth daily. 09/21/14  Yes Tresa Garter, MD  Calcium Carbonate-Vitamin D (CALTRATE 600+D) 600-400 MG-UNIT per tablet Take 1 tablet by mouth daily.   Yes Historical Provider, MD  carvedilol (COREG) 12.5 MG tablet Take 1 tablet (12.5 mg total) by mouth 2 (two) times daily with a meal. 09/21/14  Yes Tresa Garter, MD  glucose blood (WAVESENSE PRESTO) test strip Use as instructed 03/22/14  Yes Renella Cunas, MD  LANCETS MICRO THIN 33G MISC Use as directed 03/22/14  Yes Renella Cunas, MD  lisinopril-hydrochlorothiazide (PRINZIDE,ZESTORETIC) 20-25 MG per tablet Take 1 tablet by mouth daily. 09/21/14  Yes Tresa Garter, MD  metFORMIN (GLUCOPHAGE) 500 MG tablet Take 1 tablet (500 mg total) by mouth 2 (two) times daily with a meal. 09/21/14  Yes Tresa Garter, MD  nitroGLYCERIN (NITROSTAT) 0.4 MG SL tablet Place 1 tablet (0.4 mg total) under the tongue every 5 (five) minutes as needed. 02/08/15  Yes Tresa Garter, MD  prasugrel (EFFIENT) 10 MG TABS tablet Take 1 tablet (10 mg total) by mouth daily. 09/21/14  Yes Tresa Garter, MD  rosuvastatin (CRESTOR) 20 MG tablet Take 1 tablet (20 mg total) by mouth daily. 09/21/14  Yes Tresa Garter, MD  diclofenac sodium (VOLTAREN) 1 % GEL  Apply 2 g topically 4 (four) times daily. 02/08/15   Tresa Garter, MD  doxycycline (VIBRA-TABS) 100 MG tablet Take 1 tablet (100 mg total) by mouth 2 (two) times daily. Patient not taking: Reported on 02/08/2015 09/21/14   Tresa Garter, MD     Objective:   There were no vitals filed for this visit.  Exam General appearance : Awake, alert, not in any distress. Speech Clear. Not toxic looking HEENT: Atraumatic and Normocephalic, pupils equally reactive to light and accomodation Neck: supple, no JVD. No cervical lymphadenopathy.  Chest:Good air entry bilaterally, no added sounds  CVS: S1 S2 regular, no murmurs.  Abdomen: Bowel sounds present,  Non tender and not distended with no gaurding, rigidity or rebound. Extremities: B/L Lower Ext shows no edema, both legs are warm to touch Neurology: Awake alert, and oriented X 3, CN II-XII intact, Non focal Skin:No Rash  Data Review Lab Results  Component Value Date   HGBA1C 6.8 09/21/2014   HGBA1C 6.5% 01/07/2014   HGBA1C 7.5* 04/07/2011     Assessment & Plan   1. Essential hypertension  - We have discussed target BP range and blood pressure goal - I have advised patient to check BP regularly and to call us back or report to clinic if the numbers are consistently higher than 140/90  - We discussed the importance of compliance with medical therapy and DASH diet recommended, consequences of uncontrolled hypertension discussed.  - continue current BP medications  2. Hip pain, left  - diclofenac sodium (VOLTAREN) 1 % GEL; Apply 2 g topically 4 (four) times daily.  Dispense: 1 Tube; Refill: 1  3. Type 2 diabetes mellitus without complication  - Glucose (CBG) - HgB A1c  Aim for 2-3 Carb Choices per meal (30-45 grams) +/- 1 either way  Aim for 0-15 Carbs per snack if hungry  Include protein in moderation with your meals and snacks  Consider reading food labels for Total Carbohydrate and Fat Grams of foods  Consider checking BG at alternate times per day  Continue taking medication as directed Fruit Punch - find one with no sugar  Measure and decrease portions of carbohydrate foods  Make your plate and don't go back for seconds  Patient have been counseled extensively about nutrition and exercise Return in about 3 months (around 05/10/2015) for Hemoglobin A1C and Follow up, DM, Follow up HTN, Annual Physical.  The patient was given clear instructions to go to ER or return to medical center if symptoms don't improve, worsen or new problems develop. The patient verbalized understanding. The patient was told to call to get lab results if they haven't heard anything in the next  week.   This note has been created with Surveyor, quantity. Any transcriptional errors are unintentional.    Angelica Chessman, MD, [place], [place], [place], [place], [place]   02/08/2015, 5:53 PM

## 2015-02-08 NOTE — Patient Instructions (Signed)
Diabetes and Exercise Exercising regularly is important. It is not just about losing weight. It has many health benefits, such as:  Improving your overall fitness, flexibility, and endurance.  Increasing your bone density.  Helping with weight control.  Decreasing your body fat.  Increasing your muscle strength.  Reducing stress and tension.  Improving your overall health. People with diabetes who exercise gain additional benefits because exercise:  Reduces appetite.  Improves the body's use of blood sugar (glucose).  Helps lower or control blood glucose.  Decreases blood pressure.  Helps control blood lipids (such as cholesterol and triglycerides).  Improves the body's use of the hormone insulin by:  Increasing the body's insulin sensitivity.  Reducing the body's insulin needs.  Decreases the risk for heart disease because exercising:  Lowers cholesterol and triglycerides levels.  Increases the levels of good cholesterol (such as high-density lipoproteins [HDL]) in the body.  Lowers blood glucose levels. YOUR ACTIVITY PLAN  Choose an activity that you enjoy and set realistic goals. Your health care provider or diabetes educator can help you make an activity plan that works for you. Exercise regularly as directed by your health care provider. This includes:  Performing resistance training twice a week such as push-ups, sit-ups, lifting weights, or using resistance bands.  Performing 150 minutes of cardio exercises each week such as walking, running, or playing sports.  Staying active and spending no more than 90 minutes at one time being inactive. Even short bursts of exercise are good for you. Three 10-minute sessions spread throughout the day are just as beneficial as a single 30-minute session. Some exercise ideas include:  Taking the dog for a walk.  Taking the stairs instead of the elevator.  Dancing to your favorite song.  Doing an exercise  video.  Doing your favorite exercise with a friend. RECOMMENDATIONS FOR EXERCISING WITH TYPE 1 OR TYPE 2 DIABETES   Check your blood glucose before exercising. If blood glucose levels are greater than 240 mg/dL, check for urine ketones. Do not exercise if ketones are present.  Avoid injecting insulin into areas of the body that are going to be exercised. For example, avoid injecting insulin into:  The arms when playing tennis.  The legs when jogging.  Keep a record of:  Food intake before and after you exercise.  Expected peak times of insulin action.  Blood glucose levels before and after you exercise.  The type and amount of exercise you have done.  Review your records with your health care provider. Your health care provider will help you to develop guidelines for adjusting food intake and insulin amounts before and after exercising.  If you take insulin or oral hypoglycemic agents, watch for signs and symptoms of hypoglycemia. They include:  Dizziness.  Shaking.  Sweating.  Chills.  Confusion.  Drink plenty of water while you exercise to prevent dehydration or heat stroke. Body water is lost during exercise and must be replaced.  Talk to your health care provider before starting an exercise program to make sure it is safe for you. Remember, almost any type of activity is better than none. Document Released: 12/27/2003 Document Revised: 02/20/2014 Document Reviewed: 03/15/2013 ExitCare Patient Information 2015 [employer]. This information is not intended to replace advice given to you by your health care provider. Make sure you discuss any questions you have with your health care provider. Basic Carbohydrate Counting for Diabetes Mellitus Carbohydrate counting is a method for keeping track of the amount of carbohydrates you eat.   Eating carbohydrates naturally increases the level of sugar (glucose) in your blood, so it is important for you to know the amount that is  okay for you to have in every meal. Carbohydrate counting helps keep the level of glucose in your blood within normal limits. The amount of carbohydrates allowed is different for every person. A dietitian can help you calculate the amount that is right for you. Once you know the amount of carbohydrates you can have, you can count the carbohydrates in the foods you want to eat. Carbohydrates are found in the following foods:  Grains, such as breads and cereals.  Dried beans and soy products.  Starchy vegetables, such as potatoes, peas, and corn.  Fruit and fruit juices.  Milk and yogurt.  Sweets and snack foods, such as cake, cookies, candy, chips, soft drinks, and fruit drinks. CARBOHYDRATE COUNTING There are two ways to count the carbohydrates in your food. You can use either of the methods or a combination of both. Reading the "Nutrition Facts" on Packaged Food The "Nutrition Facts" is an area that is included on the labels of almost all packaged food and beverages in the [place]. It includes the serving size of that food or beverage and information about the nutrients in each serving of the food, including the grams (g) of carbohydrate per serving.  Decide the number of servings of this food or beverage that you will be able to eat or drink. Multiply that number of servings by the number of grams of carbohydrate that is listed on the label for that serving. The total will be the amount of carbohydrates you will be having when you eat or drink this food or beverage. Learning Standard Serving Sizes of Food When you eat food that is not packaged or does not include "Nutrition Facts" on the label, you need to measure the servings in order to count the amount of carbohydrates.A serving of most carbohydrate-rich foods contains about 15 g of carbohydrates. The following list includes serving sizes of carbohydrate-rich foods that provide 15 g ofcarbohydrate per serving:   1 slice of bread  (1 oz) or 1 six-inch tortilla.    of a hamburger bun or English muffin.  4-6 crackers.   cup unsweetened dry cereal.    cup hot cereal.   cup rice or pasta.    cup mashed potatoes or  of a large baked potato.  1 cup fresh fruit or one small piece of fruit.    cup canned or frozen fruit or fruit juice.  1 cup milk.   cup plain fat-free yogurt or yogurt sweetened with artificial sweeteners.   cup cooked dried beans or starchy vegetable, such as peas, corn, or potatoes.  Decide the number of standard-size servings that you will eat. Multiply that number of servings by 15 (the grams of carbohydrates in that serving). For example, if you eat 2 cups of strawberries, you will have eaten 2 servings and 30 g of carbohydrates (2 servings x 15 g = 30 g). For foods such as soups and casseroles, in which more than one food is mixed in, you will need to count the carbohydrates in each food that is included. EXAMPLE OF CARBOHYDRATE COUNTING Sample Dinner  3 oz chicken breast.   cup of brown rice.   cup of corn.  1 cup milk.   1 cup strawberries with sugar-free whipped topping.  Carbohydrate Calculation Step 1: Identify the foods that contain carbohydrates:   Rice.   Corn.     Milk.   Strawberries. Step 2:Calculate the number of servings eaten of each:   2 servings of rice.   1 serving of corn.   1 serving of milk.   1 serving of strawberries. Step 3: Multiply each of those number of servings by 15 g:   2 servings of rice x 15 g = 30 g.   1 serving of corn x 15 g = 15 g.   1 serving of milk x 15 g = 15 g.   1 serving of strawberries x 15 g = 15 g. Step 4: Add together all of the amounts to find the total grams of carbohydrates eaten: 30 g + 15 g + 15 g + 15 g = 75 g. Document Released: 10/06/2005 Document Revised: 02/20/2014 Document Reviewed: 09/02/2013 ExitCare Patient Information 2015 [employer]. This information is not intended to  replace advice given to you by your health care provider. Make sure you discuss any questions you have with your health care provider.  

## 2015-06-22 ENCOUNTER — Ambulatory Visit: Payer: Self-pay | Attending: Internal Medicine

## 2015-08-27 ENCOUNTER — Telehealth: Payer: Self-pay | Admitting: Internal Medicine

## 2015-08-27 NOTE — Telephone Encounter (Signed)
Pt. Came in requesting a med refill on rosuvastatin (CRESTOR) 20 MG tablet. Please f/u with pt.

## 2015-08-28 ENCOUNTER — Other Ambulatory Visit: Payer: Self-pay | Admitting: *Deleted

## 2015-08-28 DIAGNOSIS — E785 Hyperlipidemia, unspecified: Secondary | ICD-10-CM

## 2015-08-28 MED ORDER — ROSUVASTATIN CALCIUM 20 MG PO TABS: 20.0000 mg | ORAL_TABLET | Freq: Every day | ORAL | Status: DC

## 2015-10-01 ENCOUNTER — Other Ambulatory Visit: Payer: Self-pay | Admitting: Internal Medicine

## 2015-10-02 ENCOUNTER — Other Ambulatory Visit: Payer: Self-pay | Admitting: Internal Medicine

## 2015-10-02 DIAGNOSIS — E785 Hyperlipidemia, unspecified: Secondary | ICD-10-CM

## 2015-10-02 DIAGNOSIS — M25552 Pain in left hip: Secondary | ICD-10-CM

## 2015-10-02 DIAGNOSIS — E139 Other specified diabetes mellitus without complications: Secondary | ICD-10-CM

## 2015-10-02 DIAGNOSIS — I10 Essential (primary) hypertension: Secondary | ICD-10-CM

## 2015-10-02 MED ORDER — DICLOFENAC SODIUM 1 % TD GEL: 2.0000 g | Freq: Four times a day (QID) | TRANSDERMAL | Status: DC

## 2015-10-02 MED ORDER — METFORMIN HCL 500 MG PO TABS: 500.0000 mg | ORAL_TABLET | Freq: Two times a day (BID) | ORAL | Status: DC

## 2015-10-02 MED ORDER — AMLODIPINE BESYLATE 10 MG PO TABS: 10.0000 mg | ORAL_TABLET | Freq: Every day | ORAL | Status: DC

## 2015-10-02 MED ORDER — LISINOPRIL-HYDROCHLOROTHIAZIDE 20-25 MG PO TABS: 1.0000 | ORAL_TABLET | Freq: Every day | ORAL | Status: DC

## 2015-10-02 MED ORDER — NITROGLYCERIN 0.4 MG SL SUBL: 0.4000 mg | SUBLINGUAL_TABLET | SUBLINGUAL | Status: DC | PRN

## 2015-10-02 MED ORDER — CARVEDILOL 12.5 MG PO TABS: 12.5000 mg | ORAL_TABLET | Freq: Two times a day (BID) | ORAL | Status: DC

## 2015-10-02 MED ORDER — ROSUVASTATIN CALCIUM 20 MG PO TABS: 20.0000 mg | ORAL_TABLET | Freq: Every day | ORAL | Status: DC

## 2015-10-02 NOTE — Telephone Encounter (Signed)
Patients medications were refilled on the basis of patient scheduling an appointment in the new year. Patient has been made aware of the need to schedule an appt.

## 2015-10-25 ENCOUNTER — Ambulatory Visit: Payer: Self-pay | Admitting: Internal Medicine

## 2015-11-01 ENCOUNTER — Ambulatory Visit: Payer: Self-pay | Attending: Internal Medicine | Admitting: Family Medicine

## 2015-11-01 ENCOUNTER — Encounter (HOSPITAL_BASED_OUTPATIENT_CLINIC_OR_DEPARTMENT_OTHER): Payer: Self-pay | Admitting: Clinical

## 2015-11-01 ENCOUNTER — Encounter: Payer: Self-pay | Admitting: Family Medicine

## 2015-11-01 VITALS — BP 117/70 | HR 68 | Temp 98.4°F | Resp 18 | Ht 66.0 in | Wt 164.0 lb

## 2015-11-01 DIAGNOSIS — E785 Hyperlipidemia, unspecified: Secondary | ICD-10-CM

## 2015-11-01 DIAGNOSIS — E139 Other specified diabetes mellitus without complications: Secondary | ICD-10-CM

## 2015-11-01 DIAGNOSIS — R05 Cough: Secondary | ICD-10-CM | POA: Insufficient documentation

## 2015-11-01 DIAGNOSIS — E119 Type 2 diabetes mellitus without complications: Secondary | ICD-10-CM | POA: Insufficient documentation

## 2015-11-01 DIAGNOSIS — R059 Cough, unspecified: Secondary | ICD-10-CM | POA: Insufficient documentation

## 2015-11-01 DIAGNOSIS — F419 Anxiety disorder, unspecified: Secondary | ICD-10-CM

## 2015-11-01 DIAGNOSIS — F4321 Adjustment disorder with depressed mood: Secondary | ICD-10-CM

## 2015-11-01 DIAGNOSIS — I10 Essential (primary) hypertension: Secondary | ICD-10-CM | POA: Insufficient documentation

## 2015-11-01 LAB — GLUCOSE, POCT (MANUAL RESULT ENTRY): POC GLUCOSE: 192 mg/dL — AB (ref 70–99)

## 2015-11-01 LAB — BASIC METABOLIC PANEL
BUN: 12 mg/dL (ref 7–25)
CO2: 31 mmol/L (ref 20–31)
Calcium: 10.3 mg/dL (ref 8.6–10.4)
Chloride: 102 mmol/L (ref 98–110)
Creat: 1.2 mg/dL — ABNORMAL HIGH (ref 0.50–0.99)
GLUCOSE: 178 mg/dL — AB (ref 65–99)
POTASSIUM: 4.9 mmol/L (ref 3.5–5.3)
Sodium: 143 mmol/L (ref 135–146)

## 2015-11-01 LAB — POCT GLYCOSYLATED HEMOGLOBIN (HGB A1C): HEMOGLOBIN A1C: 8.4

## 2015-11-01 MED ORDER — ROSUVASTATIN CALCIUM 20 MG PO TABS: 20.0000 mg | ORAL_TABLET | Freq: Every day | ORAL | Status: DC

## 2015-11-01 MED ORDER — NORTRIPTYLINE HCL 25 MG PO CAPS: 25.0000 mg | ORAL_CAPSULE | Freq: Every day | ORAL | Status: DC

## 2015-11-01 MED ORDER — CARVEDILOL 12.5 MG PO TABS: 12.5000 mg | ORAL_TABLET | Freq: Two times a day (BID) | ORAL | Status: DC

## 2015-11-01 MED ORDER — PRASUGREL HCL 10 MG PO TABS: 10.0000 mg | ORAL_TABLET | Freq: Every day | ORAL | Status: DC

## 2015-11-01 MED ORDER — LISINOPRIL-HYDROCHLOROTHIAZIDE 20-25 MG PO TABS: 1.0000 | ORAL_TABLET | Freq: Every day | ORAL | Status: DC

## 2015-11-01 MED ORDER — AMLODIPINE BESYLATE 10 MG PO TABS: 10.0000 mg | ORAL_TABLET | Freq: Every day | ORAL | Status: DC

## 2015-11-01 MED ORDER — METFORMIN HCL 500 MG PO TABS
500.0000 mg | ORAL_TABLET | Freq: Two times a day (BID) | ORAL | Status: DC
Start: 2015-11-01 — End: 2016-07-17

## 2015-11-01 MED FILL — ?METFORMIN HCL 500MG TABLET: 500 | 30 days supply | Qty: 60 | Fill #0

## 2015-11-01 MED FILL — LISINOPRIL-HCTZ 20-25 MG TA: 20-25 | 30 days supply | Qty: 30 | Fill #0

## 2015-11-01 MED FILL — NORTRIPTYLINE HCL 25 MG CAP: 25 | 30 days supply | Qty: 30 | Fill #0

## 2015-11-01 MED FILL — ?CARVEDILOL 12.5 MG TABLET: 12.5 | 30 days supply | Qty: 60 | Fill #0

## 2015-11-01 MED FILL — EFFIENT 10 MG TABLET: 10 | 30 days supply | Qty: 30 | Fill #0

## 2015-11-01 MED FILL — ROSUVASTATIN CAL 20 MG TAB: 20 | 30 days supply | Qty: 30 | Fill #0

## 2015-11-01 MED FILL — AMLODIPINE BESYLATE 10 MG T: 10 | 30 days supply | Qty: 30 | Fill #0

## 2015-11-01 NOTE — Progress Notes (Signed)
ASSESSMENT: Pt currently experiencing grief, needs to f/u with PCP and [facility]; would benefit from supportive counseling regarding coping with grief.  Stage of Change: contemplative  PLAN: 1. F/U with behavioral health consultant in as needed 2. Psychiatric Medications: Pamelor. 3. Behavioral recommendation(s):   -Consider remembering good moments with daughter  SUBJECTIVE: Pt. referred by Dr Lindell Noe for symptoms of grief Pt. reports the following symptoms/concerns: Pt states that she was with her 10yo daughter when she passed away, after battling cancerous tumor. Pt says she keeps a positive outlook, and is encouraged by hearing about all the positive impact her daughter had on the people around her; she looks forward to seeing her grandson (daughter's 8yo son) graduate from high school and grow up.  Duration of problem: less than two months Severity: moderate  OBJECTIVE: Orientation & Cognition: Oriented x3. Thought processes normal and appropriate to situation. Mood: appropriate. Affect: appropriate Appearance: appropriate Risk of harm to self or others: no known risk of harm to self or others Substance use: alcohol, tobacco Assessments administered: PHQ9: 12/ GAD7: 7  Diagnosis: Grief CPT Code: F43.21 -------------------------------------------- Other(s) present in the room: none  Time spent with patient in exam room: 16 minutes

## 2015-11-01 NOTE — Progress Notes (Signed)
   Subjective:    Patient ID: Tammy Graham, female    DOB: 1951-07-20, 65 y.o.   MRN: [id number]  HPI Patient for follow up. She reports feeling anxious and sleep problems since her daughter died in 09/02/2023 after long battle with cancer. Patient quit her job in 2010 to care for daughter, is having some trouble coping. Has support system.    Is out of medications for DM, HTN.  Reviewed and refilled today.    Denies fevers or chills, denies weight changes.  Has had cough for 2 weeks with some congestion, believes related to dust in old carpet at home. Smokes less than 1ppd cigarettes, discussed cessation.   Counseled on screening mammography, flu shot (declines) and dilated eye exam.    Review of Systems     Objective:   Physical Exam Well appearing, no distress Oriented to place and time.  HEENT Neck supple, no cervical adenopathy.  COR regular S1S2, no extra sounds PULM Clear bilaterally, no rales or wheezes EXTS Palpable dp pulses, no wounds no edema in lower extremities. Sensation grossly intact in both feet. No maceration in between toes of either foot.        Assessment & Plan:

## 2015-11-01 NOTE — Patient Instructions (Signed)
It was a pleasure to see you today.   As we discussed, for the cough, I recommend taking MUCINEX 600mg  tablets, take 1 tablet by mouth twice daily.  If the cough is not better in 2 weeks, I ask that you come back for another appointment to consider further evaluation.   I am prescribing NOTRIPTYLINE 25mg  capsules, take 1 capsule by mouth an hour before bedtime, to help with sleep.   I refilled your other medications at our pharmacy.   FOLLOW UP IN 2 to 4 WEEKS TO EVALUATE SLEEP, GRIEF PROCESS, COUGH.

## 2015-11-01 NOTE — Progress Notes (Signed)
Patient her for physical and FU DM +HTN  Patient complains of pain in her lower back scaled at an 8 described as burning. Patient states pain has been present since her fall last April. Pain states pain medication gives relief, though she uses sparingly because of concerns with dependance.

## 2015-11-01 NOTE — Assessment & Plan Note (Signed)
Refills, lab assessment today.

## 2015-11-02 ENCOUNTER — Other Ambulatory Visit: Payer: Self-pay

## 2015-11-02 ENCOUNTER — Telehealth: Payer: Self-pay

## 2015-11-02 LAB — MICROALBUMIN / CREATININE URINE RATIO
CREATININE, URINE: 82 mg/dL (ref 20–320)
Microalb Creat Ratio: 11 mcg/mg creat (ref <30)
Microalb, Ur: 0.9 mg/dL

## 2015-11-02 MED ORDER — PRASUGREL HCL 10 MG PO TABS: 10.0000 mg | ORAL_TABLET | Freq: Every day | ORAL | Status: DC

## 2015-11-02 NOTE — Telephone Encounter (Signed)
She may give a longer trial before abandoning treatment.  I recommend a follow up appointment with her primary care doctor to consider alternatives.  Dalbert Mayotte, MD

## 2015-11-02 NOTE — Telephone Encounter (Signed)
Nurse called patient, patient verified date of birth. Patient aware of labs being reviewed, no changes in medication regimen. Patietn agrees to return in 2-4 weeks. Patient reports nortriptyline is not working. Patient reports taking nortriptyline at 12 last night when she was going to bed. She fell asleep at 12:30 and woke up at 3am to go to bathroom and could not go back to sleep. Patient finally went back to sleep at 6am and just woke up at 10:45am.  Patient thought she was going to take something to help her sleep all night.  Nurse will send message to provider. Patient transferred to front office staff to schedule follow up appointment.

## 2015-11-02 NOTE — Telephone Encounter (Signed)
-----   Message from Willeen Niece, MD sent at 11/02/2015  8:56 AM EST ----- Please let patient know labs reviewed, no changes in medication regimen.  Keep plan for return to office in 2-4 weeks as previously outlined in yesterday's visit note.   Dalbert Mayotte, MD

## 2015-11-02 NOTE — Telephone Encounter (Signed)
Nurse called patient, patient verified date of birth. Patient agrees to try medication longer before stopping. Patient will call back to schedule appointment with PCP if medication does not work after trying it longer.  Patient explains her system is off from trying to take care of her daughter that recently passed. Patient thinks her body is used to being up during the night and she agrees her body may have to get used to the medication.  Patient voices understanding and has no further questions at this time.

## 2015-11-06 ENCOUNTER — Telehealth: Payer: Self-pay

## 2015-11-06 NOTE — Telephone Encounter (Signed)
CMA called patient, patient verified name and DOB. Patient informed me that she received a call from someone else with lab results. I reiterate lab results with patient to confirm receipt of  information. Patient verbalized that she understood with no further questions.

## 2015-11-06 NOTE — Telephone Encounter (Signed)
-----   Message from Willeen Niece, MD sent at 11/02/2015  8:56 AM EST ----- Please let patient know labs reviewed, no changes in medication regimen.  Keep plan for return to office in 2-4 weeks as previously outlined in yesterday's visit note.   Dalbert Mayotte, MD

## 2015-11-12 ENCOUNTER — Ambulatory Visit: Payer: Self-pay | Admitting: Internal Medicine

## 2015-11-15 ENCOUNTER — Ambulatory Visit: Payer: Self-pay | Admitting: Internal Medicine

## 2015-11-29 ENCOUNTER — Ambulatory Visit: Payer: Self-pay | Attending: Internal Medicine | Admitting: Internal Medicine

## 2015-11-29 ENCOUNTER — Encounter: Payer: Self-pay | Admitting: Internal Medicine

## 2015-11-29 VITALS — BP 109/67 | HR 67 | Temp 98.2°F | Resp 18 | Ht 66.0 in | Wt 170.2 lb

## 2015-11-29 DIAGNOSIS — Z7984 Long term (current) use of oral hypoglycemic drugs: Secondary | ICD-10-CM | POA: Insufficient documentation

## 2015-11-29 DIAGNOSIS — Z888 Allergy status to other drugs, medicaments and biological substances status: Secondary | ICD-10-CM | POA: Insufficient documentation

## 2015-11-29 DIAGNOSIS — E119 Type 2 diabetes mellitus without complications: Secondary | ICD-10-CM | POA: Insufficient documentation

## 2015-11-29 DIAGNOSIS — E785 Hyperlipidemia, unspecified: Secondary | ICD-10-CM | POA: Insufficient documentation

## 2015-11-29 DIAGNOSIS — Z720 Tobacco use: Secondary | ICD-10-CM

## 2015-11-29 DIAGNOSIS — M25552 Pain in left hip: Secondary | ICD-10-CM | POA: Insufficient documentation

## 2015-11-29 DIAGNOSIS — Z7982 Long term (current) use of aspirin: Secondary | ICD-10-CM | POA: Insufficient documentation

## 2015-11-29 DIAGNOSIS — Z79899 Other long term (current) drug therapy: Secondary | ICD-10-CM | POA: Insufficient documentation

## 2015-11-29 DIAGNOSIS — F17210 Nicotine dependence, cigarettes, uncomplicated: Secondary | ICD-10-CM | POA: Insufficient documentation

## 2015-11-29 DIAGNOSIS — M545 Low back pain: Secondary | ICD-10-CM | POA: Insufficient documentation

## 2015-11-29 DIAGNOSIS — M1990 Unspecified osteoarthritis, unspecified site: Secondary | ICD-10-CM | POA: Insufficient documentation

## 2015-11-29 DIAGNOSIS — I2510 Atherosclerotic heart disease of native coronary artery without angina pectoris: Secondary | ICD-10-CM | POA: Insufficient documentation

## 2015-11-29 DIAGNOSIS — I10 Essential (primary) hypertension: Secondary | ICD-10-CM | POA: Insufficient documentation

## 2015-11-29 DIAGNOSIS — R05 Cough: Secondary | ICD-10-CM | POA: Insufficient documentation

## 2015-11-29 DIAGNOSIS — I252 Old myocardial infarction: Secondary | ICD-10-CM | POA: Insufficient documentation

## 2015-11-29 LAB — GLUCOSE, POCT (MANUAL RESULT ENTRY): POC GLUCOSE: 242 mg/dL — AB (ref 70–99)

## 2015-11-29 MED ORDER — ACETAMINOPHEN-CODEINE #3 300-30 MG PO TABS: 1.0000 | ORAL_TABLET | ORAL | Status: DC | PRN

## 2015-11-29 MED FILL — ROSUVASTATIN CAL 20 MG TAB: 20 | 30 days supply | Qty: 30 | Fill #1

## 2015-11-29 MED FILL — LISINOPRIL-HCTZ 20-25 MG TA: 20-25 | 30 days supply | Qty: 30 | Fill #1

## 2015-11-29 MED FILL — AMLODIPINE BESYLATE 10 MG T: 10 | 30 days supply | Qty: 30 | Fill #1

## 2015-11-29 MED FILL — NORTRIPTYLINE HCL 25 MG CAP: 25 | 30 days supply | Qty: 30 | Fill #1

## 2015-11-29 MED FILL — CARVEDILOL 12.5 MG TABLET: 12.5 | 30 days supply | Qty: 60 | Fill #1

## 2015-11-29 MED FILL — metFORMIN HCL 500 MG TABS: 500 | 30 days supply | Qty: 60 | Fill #1

## 2015-11-29 MED FILL — !EFFIENT 10 MG TABLET: 10 | 30 days supply | Qty: 30 | Fill #1

## 2015-11-29 NOTE — Patient Instructions (Signed)

## 2015-11-29 NOTE — Progress Notes (Signed)
Patient is here for Grief  Patient denies pain at this time.  Patient declined the flu shot

## 2015-11-29 NOTE — Progress Notes (Signed)
Patient ID: Tammy Graham, female   DOB: [birth year], 65 y.o.   MRN: [id number]   Ranie Knoedler, is a 65 y.o. female  [id number]  [id number]  DOB - [birth year]  CC:  Chief Complaint  Patient presents with  . Cough     HPI: Tammy Graham is a 65 y.o. female here today to for a follow up visit and for medication refills. Patient with history of coronary artery disease, hypertension, hyperlipidemia, diabetes mellitus, generalized anxiety and sleep apnea. Patient saw Dr Lindell Noe on 2015-11-05 for anxiety r/t the death of her daughter. He prescribed nortriptyline, patient is not taking "doesn't like the way it makes her feel".  Patient presents with continued complaint of low back pain from fall last year. Patient rates pain as 4 of 10, described as "bee sting" and sporadic. Patient has tried voltaren gel and does not wish to continue it at this time.  Patient is 45 pack year history of cigarette smoking and is interested in quitting. Would like to quit smoking in or around her birthday, is currently cutting back to 3 to 4 cigarettes a day, from a pack or more. Patient has not had pap smear or mammogram, both will be scheduled today for after 12/20/2015 due to insurance issues. Patient would like to get pneumonia vaccine a and labs at next visit.  Allergies  Allergen Reactions  . Benadryl [Diphenhydramine Hcl (Sleep)]   . Simvastatin    Past Medical History  Diagnosis Date  . CAD (coronary artery disease)   . HTN (hypertension)   . Hyperlipemia   . DM (diabetes mellitus) ([place])   . Chest pain   . Anxiety   . Myocardial infarction ([place])   . Sleep apnea   . Arthritis     shoulder and knees   Current Outpatient Prescriptions on File Prior to Visit  Medication Sig Dispense Refill  . amLODipine (NORVASC) 10 MG tablet Take 1 tablet (10 mg total) by mouth daily. 90 tablet 3  . aspirin 81 MG tablet Take 1 tablet (81 mg total) by mouth daily. 90 tablet 3  . Calcium Carbonate-Vitamin D  (CALTRATE 600+D) 600-400 MG-UNIT per tablet Take 1 tablet by mouth daily.    . carvedilol (COREG) 12.5 MG tablet Take 1 tablet (12.5 mg total) by mouth 2 (two) times daily with a meal. 180 tablet 3  . diclofenac sodium (VOLTAREN) 1 % GEL Apply 2 g topically 4 (four) times daily. 1 Tube 1  . glucose blood (WAVESENSE PRESTO) test strip Use as instructed 50 each 5  . LANCETS MICRO THIN 33G MISC Use as directed 100 each 5  . lisinopril-hydrochlorothiazide (PRINZIDE,ZESTORETIC) 20-25 MG tablet Take 1 tablet by mouth daily. 90 tablet 3  . metFORMIN (GLUCOPHAGE) 500 MG tablet Take 1 tablet (500 mg total) by mouth 2 (two) times daily with a meal. 180 tablet 3  . nitroGLYCERIN (NITROSTAT) 0.4 MG SL tablet Place 1 tablet (0.4 mg total) under the tongue every 5 (five) minutes as needed. 30 tablet 5  . prasugrel (EFFIENT) 10 MG TABS tablet Take 1 tablet (10 mg total) by mouth daily. 90 each 3  . rosuvastatin (CRESTOR) 20 MG tablet Take 1 tablet (20 mg total) by mouth daily. 90 tablet 3   No current facility-administered medications on file prior to visit.   Family History  Problem Relation Age of Onset  . Anesthesia problems Neg Hx   . Hypotension Neg Hx   . Malignant hyperthermia Neg Hx   . Pseudochol  deficiency Neg Hx    Social History   Social History  . Marital Status: Widowed    Spouse Name: N/A  . Number of Children: N/A  . Years of Education: N/A   Occupational History  . Not on file.   Social History Main Topics  . Smoking status: Current Some Day Smoker -- 0.25 packs/day for 15 years    Types: Cigarettes  . Smokeless tobacco: Not on file  . Alcohol Use: Yes  . Drug Use: No  . Sexual Activity: Not Currently    Birth Control/ Protection: None   Other Topics Concern  . Not on file   Social History Narrative    Review of Systems: Constitutional: Negative for fever, chills, diaphoresis, activity change, appetite change and fatigue. HENT: Negative for ear pain, nosebleeds,  congestion, facial swelling, rhinorrhea, neck pain, neck stiffness and ear discharge.  Eyes: Negative for pain, discharge, redness, itching and visual disturbance. Respiratory: Negative for cough, choking, chest tightness, shortness of breath, wheezing and stridor.  Cardiovascular: Negative for chest pain, palpitations and leg swelling. Gastrointestinal: Negative for abdominal distention. Genitourinary: Negative for dysuria, urgency, frequency, hematuria, flank pain, decreased urine volume, difficulty urinating and dyspareunia.  Musculoskeletal: Negative for back pain, joint swelling, arthralgia and gait problem. Neurological: Negative for dizziness, tremors, seizures, syncope, facial asymmetry, speech difficulty, weakness, light-headedness, numbness and headaches.  Hematological: Negative for adenopathy. Does not bruise/bleed easily. Psychiatric/Behavioral: Negative for hallucinations, behavioral problems, confusion, dysphoric mood, decreased concentration and agitation.    Objective:   Filed Vitals:   11/29/15 1203  BP: 109/67  Pulse: 67  Temp: 98.2 F (36.8 C)  Resp: 18    Physical Exam: Constitutional: Patient appears well-developed and well-nourished. No distress. Eyes: Conjunctivae and EOM are normal. PERRLA, no scleral icterus. Neck: Normal ROM. Neck supple. No JVD. No tracheal deviation. No thyromegaly. CVS: RRR, S1/S2 +, no murmurs, no gallops, no carotid bruit.  Pulmonary: Effort and breath sounds normal, no stridor, rhonchi, wheezes, rales.  Abdominal: Soft. BS +, no distension, tenderness, rebound or guarding.  Musculoskeletal: Normal range of motion. No edema and no tenderness. Low back pain w/o sciatica. Lymphadenopathy: No lymphadenopathy noted. Neuro: Alert. Normal reflexes, muscle tone coordination. Skin: Skin is warm and dry. No rash noted. Not diaphoretic. No erythema. No pallor. Psychiatric: Normal mood and affect. Behavior, judgment, thought content  normal.  Lab Results  Component Value Date   WBC 5.9 01/11/2014   HGB 14.7 01/11/2014   HCT 41.9 01/11/2014   MCV 92.1 01/11/2014   PLT 253 01/11/2014   Lab Results  Component Value Date   CREATININE 1.20* 11/01/2015   BUN 12 11/01/2015   NA 143 11/01/2015   K 4.9 11/01/2015   CL 102 11/01/2015   CO2 31 11/01/2015    Lab Results  Component Value Date   HGBA1C 8.40 11/01/2015   Lipid Panel     Component Value Date/Time   CHOL 122 05/04/2014 0919   TRIG 90 05/04/2014 0919   HDL 39* 05/04/2014 0919   CHOLHDL 3.1 05/04/2014 0919   VLDL 18 05/04/2014 0919   LDLCALC 65 05/04/2014 0919       Assessment and plan:   Hooria was seen today for cough.  Diagnoses and all orders for this visit:  Type 2 diabetes mellitus without complication, without long-term current use of insulin (HCC) -     Glucose (CBG) Aim for 30 minutes of exercise most days. Rethink what you drink.  Water is great! Aim for 2-3  Carb Choices per meal (30-45 grams) +/- 1 either way   Aim for 0-15 Carbs per snack if hungry   Include protein in moderation with your meals and snacks   Consider reading food labels for Total Carbohydrate and Fat Grams of foods   Consider checking BG at alternate times per day   Continue taking medication as directed Be mindful about how much sugar you are adding to beverages and other foods.  Fruit Punch - find one with no sugar   Measure and decrease portions of carbohydrate foods   Make your plate and don't go back for seconds  Dyslipidemia   To address this please limit saturated fat to no more than 7% of your calories, limit cholesterol to 200 mg/day, increase fiber and exercise as tolerated. If needed we may add another cholesterol lowering medication to your regimen.   Tobacco use  Timberlyn was counseled on the dangers of tobacco use, and was advised to quit. Reviewed strategies to maximize success, including removing cigarettes and smoking materials from  environment, stress management and support of family/friends.  Essential hypertension  Continue current medicines  We have discussed target BP range and blood pressure goal - I have advised patient to check BP regularly and to call us back or report to clinic if the numbers are consistently higher than 140/90   - We discussed the importance of compliance with medical therapy and DASH diet recommended, consequences of uncontrolled hypertension discussed.   - continue current BP medications  Hip pain, left  -     acetaminophen-codeine (TYLENOL #3) 300-30 MG tablet; Take 1 tablet by mouth every 4 (four) hours as needed for moderate pain. -     Cancel: diclofenac sodium (VOLTAREN) 1 % GEL; Apply 2 g topically 4 (four) times daily. Patient to continue stretching and low back pain exercises. Patient encouraged to try water aerobics or water exercise.   Return in about 4 weeks (around 12/27/2015), or if symptoms worsen or fail to improve, for Hemoglobin A1C and Follow up, DM, Annual Physical.  The patient was given clear instructions to go to ER or return to medical center if symptoms don't improve, worsen or new problems develop. The patient verbalized understanding.   [place] [phone] 11/29/2015, 2:08 PM  Evaluation and management procedures were performed by the Advanced Practitioner under my supervision and collaboration. I have reviewed the Advanced Practitioner's note and chart, and I agree with the management and plan.   Angelica Chessman, MD, [place], [place], [place], [place] and [place], [place]   12/03/2015, 6:29 PM

## 2015-12-06 ENCOUNTER — Ambulatory Visit: Payer: Self-pay | Attending: Internal Medicine | Admitting: Internal Medicine

## 2015-12-06 ENCOUNTER — Encounter: Payer: Self-pay | Admitting: Internal Medicine

## 2015-12-06 VITALS — BP 96/62 | HR 63 | Temp 98.3°F | Resp 18 | Ht 66.0 in | Wt 168.6 lb

## 2015-12-06 DIAGNOSIS — I2510 Atherosclerotic heart disease of native coronary artery without angina pectoris: Secondary | ICD-10-CM | POA: Insufficient documentation

## 2015-12-06 DIAGNOSIS — I252 Old myocardial infarction: Secondary | ICD-10-CM | POA: Insufficient documentation

## 2015-12-06 DIAGNOSIS — E119 Type 2 diabetes mellitus without complications: Secondary | ICD-10-CM | POA: Insufficient documentation

## 2015-12-06 DIAGNOSIS — E785 Hyperlipidemia, unspecified: Secondary | ICD-10-CM | POA: Insufficient documentation

## 2015-12-06 DIAGNOSIS — Z888 Allergy status to other drugs, medicaments and biological substances status: Secondary | ICD-10-CM | POA: Insufficient documentation

## 2015-12-06 DIAGNOSIS — Z1272 Encounter for screening for malignant neoplasm of vagina: Secondary | ICD-10-CM | POA: Insufficient documentation

## 2015-12-06 DIAGNOSIS — Z79899 Other long term (current) drug therapy: Secondary | ICD-10-CM | POA: Insufficient documentation

## 2015-12-06 DIAGNOSIS — I10 Essential (primary) hypertension: Secondary | ICD-10-CM | POA: Insufficient documentation

## 2015-12-06 DIAGNOSIS — Z7984 Long term (current) use of oral hypoglycemic drugs: Secondary | ICD-10-CM | POA: Insufficient documentation

## 2015-12-06 DIAGNOSIS — Z01419 Encounter for gynecological examination (general) (routine) without abnormal findings: Secondary | ICD-10-CM | POA: Insufficient documentation

## 2015-12-06 DIAGNOSIS — Z7982 Long term (current) use of aspirin: Secondary | ICD-10-CM | POA: Insufficient documentation

## 2015-12-06 DIAGNOSIS — Z87891 Personal history of nicotine dependence: Secondary | ICD-10-CM | POA: Insufficient documentation

## 2015-12-06 DIAGNOSIS — M1990 Unspecified osteoarthritis, unspecified site: Secondary | ICD-10-CM | POA: Insufficient documentation

## 2015-12-06 DIAGNOSIS — Z124 Encounter for screening for malignant neoplasm of cervix: Secondary | ICD-10-CM | POA: Insufficient documentation

## 2015-12-06 NOTE — Patient Instructions (Signed)
Diabetes and Exercise Exercising regularly is important. It is not just about losing weight. It has many health benefits, such as:  Improving your overall fitness, flexibility, and endurance.  Increasing your bone density.  Helping with weight control.  Decreasing your body fat.  Increasing your muscle strength.  Reducing stress and tension.  Improving your overall health. People with diabetes who exercise gain additional benefits because exercise:  Reduces appetite.  Improves the body's use of blood sugar (glucose).  Helps lower or control blood glucose.  Decreases blood pressure.  Helps control blood lipids (such as cholesterol and triglycerides).  Improves the body's use of the hormone insulin by:  Increasing the body's insulin sensitivity.  Reducing the body's insulin needs.  Decreases the risk for heart disease because exercising:  Lowers cholesterol and triglycerides levels.  Increases the levels of good cholesterol (such as high-density lipoproteins [HDL]) in the body.  Lowers blood glucose levels. YOUR ACTIVITY PLAN  Choose an activity that you enjoy, and set realistic goals. To exercise safely, you should begin practicing any new physical activity slowly, and gradually increase the intensity of the exercise over time. Your health care provider or diabetes educator can help create an activity plan that works for you. General recommendations include:  Encouraging children to engage in at least 60 minutes of physical activity each day.  Stretching and performing strength training exercises, such as yoga or weight lifting, at least 2 times per week.  Performing a total of at least 150 minutes of moderate-intensity exercise each week, such as brisk walking or water aerobics.  Exercising at least 3 days per week, making sure you allow no more than 2 consecutive days to pass without exercising.  Avoiding long periods of inactivity (90 minutes or more). When you  have to spend an extended period of time sitting down, take frequent breaks to walk or stretch. RECOMMENDATIONS FOR EXERCISING WITH TYPE 1 OR TYPE 2 DIABETES   Check your blood glucose before exercising. If blood glucose levels are greater than 240 mg/dL, check for urine ketones. Do not exercise if ketones are present.  Avoid injecting insulin into areas of the body that are going to be exercised. For example, avoid injecting insulin into:  The arms when playing tennis.  The legs when jogging.  Keep a record of:  Food intake before and after you exercise.  Expected peak times of insulin action.  Blood glucose levels before and after you exercise.  The type and amount of exercise you have done.  Review your records with your health care provider. Your health care provider will help you to develop guidelines for adjusting food intake and insulin amounts before and after exercising.  If you take insulin or oral hypoglycemic agents, watch for signs and symptoms of hypoglycemia. They include:  Dizziness.  Shaking.  Sweating.  Chills.  Confusion.  Drink plenty of water while you exercise to prevent dehydration or heat stroke. Body water is lost during exercise and must be replaced.  Talk to your health care provider before starting an exercise program to make sure it is safe for you. Remember, almost any type of activity is better than none.   This information is not intended to replace advice given to you by your health care provider. Make sure you discuss any questions you have with your health care provider.   Document Released: 12/27/2003 Document Revised: 02/20/2015 Document Reviewed: 03/15/2013 Elsevier Interactive Patient Education 2016 [employer]. Smoking Cessation, Tips for Success If you are  ready to quit smoking, congratulations! You have chosen to help yourself be healthier. Cigarettes bring nicotine, tar, carbon monoxide, and other irritants into your body. Your  lungs, heart, and blood vessels will be able to work better without these poisons. There are many different ways to quit smoking. Nicotine gum, nicotine patches, a nicotine inhaler, or nicotine nasal spray can help with physical craving. Hypnosis, support groups, and medicines help break the habit of smoking. WHAT THINGS CAN I DO TO MAKE QUITTING EASIER?  Here are some tips to help you quit for good:  Pick a date when you will quit smoking completely. Tell all of your friends and family about your plan to quit on that date.  Do not try to slowly cut down on the number of cigarettes you are smoking. Pick a quit date and quit smoking completely starting on that day.  Throw away all cigarettes.   Clean and remove all ashtrays from your home, work, and car.  On a card, write down your reasons for quitting. Carry the card with you and read it when you get the urge to smoke.  Cleanse your body of nicotine. Drink enough water and fluids to keep your urine clear or pale yellow. Do this after quitting to flush the nicotine from your body.  Learn to predict your moods. Do not let a bad situation be your excuse to have a cigarette. Some situations in your life might tempt you into wanting a cigarette.  Never have "just one" cigarette. It leads to wanting another and another. Remind yourself of your decision to quit.  Change habits associated with smoking. If you smoked while driving or when feeling stressed, try other activities to replace smoking. Stand up when drinking your coffee. Brush your teeth after eating. Sit in a different chair when you read the paper. Avoid alcohol while trying to quit, and try to drink fewer caffeinated beverages. Alcohol and caffeine may urge you to smoke.  Avoid foods and drinks that can trigger a desire to smoke, such as sugary or spicy foods and alcohol.  Ask people who smoke not to smoke around you.  Have something planned to do right after eating or having a cup of  coffee. For example, plan to take a walk or exercise.  Try a relaxation exercise to calm you down and decrease your stress. Remember, you may be tense and nervous for the first 2 weeks after you quit, but this will pass.  Find new activities to keep your hands busy. Play with a pen, coin, or rubber band. Doodle or draw things on paper.  Brush your teeth right after eating. This will help cut down on the craving for the taste of tobacco after meals. You can also try mouthwash.   Use oral substitutes in place of cigarettes. Try using lemon drops, carrots, cinnamon sticks, or chewing gum. Keep them handy so they are available when you have the urge to smoke.  When you have the urge to smoke, try deep breathing.  Designate your home as a nonsmoking area.  If you are a heavy smoker, ask your health care provider about a prescription for nicotine chewing gum. It can ease your withdrawal from nicotine.  Reward yourself. Set aside the cigarette money you save and buy yourself something nice.  Look for support from others. Join a support group or smoking cessation program. Ask someone at home or at work to help you with your plan to quit smoking.  Always ask yourself, "  Do I need this cigarette or is this just a reflex?" Tell yourself, "Today, I choose not to smoke," or "I do not want to smoke." You are reminding yourself of your decision to quit.  Do not replace cigarette smoking with electronic cigarettes (commonly called e-cigarettes). The safety of e-cigarettes is unknown, and some may contain harmful chemicals.  If you relapse, do not give up! Plan ahead and think about what you will do the next time you get the urge to smoke. HOW WILL I FEEL WHEN I QUIT SMOKING? You may have symptoms of withdrawal because your body is used to nicotine (the addictive substance in cigarettes). You may crave cigarettes, be irritable, feel very hungry, cough often, get headaches, or have difficulty concentrating.  The withdrawal symptoms are only temporary. They are strongest when you first quit but will go away within 10-14 days. When withdrawal symptoms occur, stay in control. Think about your reasons for quitting. Remind yourself that these are signs that your body is healing and getting used to being without cigarettes. Remember that withdrawal symptoms are easier to treat than the major diseases that smoking can cause.  Even after the withdrawal is over, expect periodic urges to smoke. However, these cravings are generally short lived and will go away whether you smoke or not. Do not smoke! WHAT RESOURCES ARE AVAILABLE TO HELP ME QUIT SMOKING? Your health care provider can direct you to community resources or hospitals for support, which may include:  Group support.  Education.  Hypnosis.  Therapy.   This information is not intended to replace advice given to you by your health care provider. Make sure you discuss any questions you have with your health care provider.   Document Released: 07/04/2004 Document Revised: 10/27/2014 Document Reviewed: 03/24/2013 Elsevier Interactive Patient Education [employer].

## 2015-12-06 NOTE — Progress Notes (Deleted)
   Subjective:    Patient ID: Tammy Graham, female    DOB: Sep 04, 1951, 66 y.o.   MRN: [id number]  Gynecologic Exam    Tammy Graham is a 65 y.o. female here today to for a follow up visit for pap smear. Patient with history of coronary artery disease, hypertension, hyperlipidemia, diabetes mellitus, generalized anxiety and sleep apnea. Patient has stopped smoking and stopped caffeine, she has complaint of lack of energy and believes it is related to her stopping smoking and caffeine. Patient has no abdominal pain, no headache, no SOB or cough, and no new numbness or tingling.  Review of Systems     Objective:   Physical Exam        Assessment & Plan:

## 2015-12-06 NOTE — Progress Notes (Signed)
Patient is here for PAP ONLY  Patient denies pain at this time.  Patient declined the flu shot again today.

## 2015-12-06 NOTE — Progress Notes (Signed)
Tammy Graham, is a 65 y.o. female  [id number]  [id number]  DOB - 10-Sep-1951  CC:  Chief Complaint  Patient presents with  . Gynecologic Exam    PAP ONLY    HPI: Tammy Graham is a 65 y.o. female here today to for pap smear. Patient has history of CAD, HTN, Hyperlipidemia, DM type 2 and generalized anxiety and sleep apnea. Patient was seen by me on 11/29/15 and smoking cessation was discussed. Patient has stopped smoking and has stopped caffeine. Patient complaint of fatigue today and believes is related to smoking and caffeine stoppage. Patient was congratulated and encouraged to keep up with cessation.  Allergies  Allergen Reactions  . Benadryl [Diphenhydramine Hcl (Sleep)]   . Simvastatin    Past Medical History  Diagnosis Date  . CAD (coronary artery disease)   . HTN (hypertension)   . Hyperlipemia   . DM (diabetes mellitus) ([place])   . Chest pain   . Anxiety   . Myocardial infarction ([place])   . Sleep apnea   . Arthritis     shoulder and knees   Current Outpatient Prescriptions on File Prior to Visit  Medication Sig Dispense Refill  . acetaminophen-codeine (TYLENOL #3) 300-30 MG tablet Take 1 tablet by mouth every 4 (four) hours as needed for moderate pain. 60 tablet 0  . amLODipine (NORVASC) 10 MG tablet Take 1 tablet (10 mg total) by mouth daily. 90 tablet 3  . aspirin 81 MG tablet Take 1 tablet (81 mg total) by mouth daily. 90 tablet 3  . Calcium Carbonate-Vitamin D (CALTRATE 600+D) 600-400 MG-UNIT per tablet Take 1 tablet by mouth daily.    . carvedilol (COREG) 12.5 MG tablet Take 1 tablet (12.5 mg total) by mouth 2 (two) times daily with a meal. 180 tablet 3  . diclofenac sodium (VOLTAREN) 1 % GEL Apply 2 g topically 4 (four) times daily. 1 Tube 1  . glucose blood (WAVESENSE PRESTO) test strip Use as instructed 50 each 5  . LANCETS MICRO THIN 33G MISC Use as directed 100 each 5  . lisinopril-hydrochlorothiazide (PRINZIDE,ZESTORETIC) 20-25 MG tablet Take 1  tablet by mouth daily. 90 tablet 3  . metFORMIN (GLUCOPHAGE) 500 MG tablet Take 1 tablet (500 mg total) by mouth 2 (two) times daily with a meal. 180 tablet 3  . nitroGLYCERIN (NITROSTAT) 0.4 MG SL tablet Place 1 tablet (0.4 mg total) under the tongue every 5 (five) minutes as needed. 30 tablet 5  . prasugrel (EFFIENT) 10 MG TABS tablet Take 1 tablet (10 mg total) by mouth daily. 90 each 3  . rosuvastatin (CRESTOR) 20 MG tablet Take 1 tablet (20 mg total) by mouth daily. 90 tablet 3   No current facility-administered medications on file prior to visit.   Family History  Problem Relation Age of Onset  . Anesthesia problems Neg Hx   . Hypotension Neg Hx   . Malignant hyperthermia Neg Hx   . Pseudochol deficiency Neg Hx    Social History   Social History  . Marital Status: Widowed    Spouse Name: N/A  . Number of Children: N/A  . Years of Education: N/A   Occupational History  . Not on file.   Social History Main Topics  . Smoking status: Former Smoker -- 0.25 packs/day for 15 years    Types: Cigarettes    Quit date: 12/03/2015  . Smokeless tobacco: Not on file  . Alcohol Use: Yes  . Drug Use: No  . Sexual Activity:  Not Currently    Birth Control/ Protection: None   Other Topics Concern  . Not on file   Social History Narrative    Review of Systems: Constitutional: Negative for fever, chills, diaphoresis, activity change, appetite change and fatigue. HENT: Negative for ear pain, nosebleeds, congestion, facial swelling, rhinorrhea, neck pain, neck stiffness and ear discharge.  Eyes: Negative for pain, discharge, redness, itching and visual disturbance. Respiratory: Negative for cough, choking, chest tightness, shortness of breath, wheezing and stridor.  Cardiovascular: Negative for chest pain, palpitations and leg swelling. Gastrointestinal: Negative for abdominal distention. Genitourinary: Negative for dysuria, urgency, frequency, hematuria, flank pain, decreased urine  volume, difficulty urinating and dyspareunia.  Musculoskeletal: Negative for back pain, joint swelling, arthralgia and gait problem. Neurological: Negative for dizziness, tremors, seizures, syncope, facial asymmetry, speech difficulty, weakness, light-headedness, numbness and headaches.  Hematological: Negative for adenopathy. Does not bruise/bleed easily. Psychiatric/Behavioral: Negative for hallucinations, behavioral problems, confusion, dysphoric mood, decreased concentration and agitation.    Objective:   Filed Vitals:   12/06/15 0942  BP: 96/62  Pulse: 63  Temp: 98.3 F (36.8 C)  Resp: 18    Physical Exam: Constitutional: Patient appears well-developed and well-nourished. No distress. CVS: RRR, S1/S2 +, no murmurs, no gallops, no carotid bruit.  Pulmonary: Effort and breath sounds normal, no stridor, rhonchi, wheezes, rales.  Abdominal: Soft. BS +, no distension, tenderness, rebound or guarding.  GU: labia, clitoris, urethral orifice & introitus - all wnl, cervix pink, uterus is anterior, midline, smooth, not enlarged  Neuro: Alert & Oriented x 4 Psychiatric: Normal mood and affect. Behavior, judgment, thought content normal.  Lab Results  Component Value Date   WBC 5.9 01/11/2014   HGB 14.7 01/11/2014   HCT 41.9 01/11/2014   MCV 92.1 01/11/2014   PLT 253 01/11/2014   Lab Results  Component Value Date   CREATININE 1.20* 11/01/2015   BUN 12 11/01/2015   NA 143 11/01/2015   K 4.9 11/01/2015   CL 102 11/01/2015   CO2 31 11/01/2015    Lab Results  Component Value Date   HGBA1C 8.40 11/01/2015   Lipid Panel     Component Value Date/Time   CHOL 122 05/04/2014 0919   TRIG 90 05/04/2014 0919   HDL 39* 05/04/2014 0919   CHOLHDL 3.1 05/04/2014 0919   VLDL 18 05/04/2014 0919   LDLCALC 65 05/04/2014 0919       Assessment and plan:   Tammy Graham was seen today for gynecologic exam.  Diagnoses and all orders for this visit:  Type 2 diabetes mellitus without  complication, without long-term current use of insulin ([place])  Pap smear for cervical cancer screening -     Cytology - PAP [facility]  Awaiting cytology Patient counselled  Return in about 3 months (around 03/04/2016) for Hemoglobin A1C and Follow up, DM.  The patient was given clear instructions to go to ER or return to medical center if symptoms don't improve, worsen or new problems develop. The patient verbalized understanding. The patient was told to call to get lab results if they haven't heard anything in the next week.     Clois Dupes, [facility] [phone] 12/06/2015, 11:04 AM Evaluation and management procedures were performed by the Advanced Practitioner under my supervision and collaboration. I have reviewed the Advanced Practitioner's note and chart, and I agree with the management and plan.   Angelica Chessman, MD, [place], [place], [place], [place] and [facility] [place], [place]  12/07/2015, 9:45 AM

## 2015-12-07 LAB — CYTOLOGY - PAP

## 2015-12-10 ENCOUNTER — Other Ambulatory Visit: Payer: Self-pay | Admitting: Internal Medicine

## 2015-12-10 MED ORDER — METRONIDAZOLE 0.75 % VA GEL: 1.0000 | Freq: Two times a day (BID) | VAGINAL | Status: DC

## 2015-12-10 MED FILL — VANDAZOLE VAGINAL 0.75% GEL: 0.75 | 7 days supply | Qty: 70 | Fill #0

## 2015-12-11 ENCOUNTER — Telehealth: Payer: Self-pay | Admitting: *Deleted

## 2015-12-11 NOTE — Telephone Encounter (Signed)
-----   Message from Tresa Garter, MD sent at 12/10/2015  4:16 PM EST ----- Please inform patient that her Pap smear shows no evidence of malignancy. There is however a shift in the normal flora which favors bacterial vaginosis infection, not a sexually transmitted disease. We will prescribe a vaginal gel for 5 days. Ready for Pick up

## 2015-12-11 NOTE — Telephone Encounter (Signed)
Patient verified DOB Patient informed of Pap Smear showing no evidence of malignancy. Patient made aware of BV being present. Patient advised to pickup Flagyl prescription from [facility]. Patient expressed her understanding and had no further questions at this time.

## 2016-01-01 MED FILL — LISINOPRIL-HCTZ 20-25 MG TA: 20-25 | 90 days supply | Qty: 90 | Fill #2

## 2016-01-01 MED FILL — metFORMIN HCL 500 MG TABS: 500 | 90 days supply | Qty: 180 | Fill #2

## 2016-01-01 MED FILL — AMLODIPINE BESYLATE 10 MG T: 10 | 90 days supply | Qty: 90 | Fill #2

## 2016-01-01 MED FILL — CARVEDILOL 12.5 MG TABLET: 12.5 | 90 days supply | Qty: 180 | Fill #2

## 2016-01-03 MED FILL — CRESTOR 20 MG TABLET: 20 | 90 days supply | Qty: 90 | Fill #2

## 2016-01-04 MED FILL — EFFIENT 10 MG TABLET: 10 | 90 days supply | Qty: 90 | Fill #2

## 2016-01-16 ENCOUNTER — Telehealth: Payer: Self-pay | Admitting: *Deleted

## 2016-01-16 NOTE — Telephone Encounter (Signed)
Patient presented to the office with vaginal concerns. Patient complains of vaginal itching after finishing vaginal gel prescribed upon completing pap smear Patient states she began using Monistat after completing Vaginal gel for 1 week x2 daily. Patient began to rub herself raw and began to use vagisil after no relief was provided after using Monitsat Patient was scheduled for a FU with PCP on 01/24/16 at 11:30am.

## 2016-01-24 ENCOUNTER — Ambulatory Visit: Payer: Medicare Other | Attending: Internal Medicine | Admitting: Internal Medicine

## 2016-01-24 ENCOUNTER — Other Ambulatory Visit (HOSPITAL_COMMUNITY)
Admission: RE | Admit: 2016-01-24 | Discharge: 2016-01-24 | Disposition: A | Discharge status: Home / Self Care | Payer: Medicare Other | Source: Ambulatory Visit | Attending: Internal Medicine | Admitting: Internal Medicine

## 2016-01-24 ENCOUNTER — Encounter: Payer: Self-pay | Admitting: Internal Medicine

## 2016-01-24 VITALS — Ht 66.0 in | Wt 167.2 lb

## 2016-01-24 DIAGNOSIS — Z7982 Long term (current) use of aspirin: Secondary | ICD-10-CM | POA: Insufficient documentation

## 2016-01-24 DIAGNOSIS — I2510 Atherosclerotic heart disease of native coronary artery without angina pectoris: Secondary | ICD-10-CM | POA: Insufficient documentation

## 2016-01-24 DIAGNOSIS — I252 Old myocardial infarction: Secondary | ICD-10-CM | POA: Insufficient documentation

## 2016-01-24 DIAGNOSIS — Z720 Tobacco use: Secondary | ICD-10-CM | POA: Diagnosis not present

## 2016-01-24 DIAGNOSIS — M545 Low back pain, unspecified: Secondary | ICD-10-CM

## 2016-01-24 DIAGNOSIS — I10 Essential (primary) hypertension: Secondary | ICD-10-CM | POA: Diagnosis not present

## 2016-01-24 DIAGNOSIS — F411 Generalized anxiety disorder: Secondary | ICD-10-CM | POA: Insufficient documentation

## 2016-01-24 DIAGNOSIS — L298 Other pruritus: Secondary | ICD-10-CM

## 2016-01-24 DIAGNOSIS — Z79899 Other long term (current) drug therapy: Secondary | ICD-10-CM | POA: Diagnosis not present

## 2016-01-24 DIAGNOSIS — N898 Other specified noninflammatory disorders of vagina: Secondary | ICD-10-CM | POA: Insufficient documentation

## 2016-01-24 DIAGNOSIS — M1990 Unspecified osteoarthritis, unspecified site: Secondary | ICD-10-CM | POA: Diagnosis not present

## 2016-01-24 DIAGNOSIS — L292 Pruritus vulvae: Secondary | ICD-10-CM | POA: Diagnosis present

## 2016-01-24 DIAGNOSIS — G4730 Sleep apnea, unspecified: Secondary | ICD-10-CM | POA: Insufficient documentation

## 2016-01-24 DIAGNOSIS — G4733 Obstructive sleep apnea (adult) (pediatric): Secondary | ICD-10-CM | POA: Insufficient documentation

## 2016-01-24 DIAGNOSIS — E119 Type 2 diabetes mellitus without complications: Secondary | ICD-10-CM | POA: Diagnosis not present

## 2016-01-24 DIAGNOSIS — N760 Acute vaginitis: Secondary | ICD-10-CM | POA: Insufficient documentation

## 2016-01-24 DIAGNOSIS — Z888 Allergy status to other drugs, medicaments and biological substances status: Secondary | ICD-10-CM | POA: Diagnosis not present

## 2016-01-24 DIAGNOSIS — Z7984 Long term (current) use of oral hypoglycemic drugs: Secondary | ICD-10-CM | POA: Insufficient documentation

## 2016-01-24 DIAGNOSIS — E785 Hyperlipidemia, unspecified: Secondary | ICD-10-CM | POA: Insufficient documentation

## 2016-01-24 LAB — GLUCOSE, POCT (MANUAL RESULT ENTRY): POC Glucose: 168 mg/dL — AB (ref 70–99)

## 2016-01-24 MED ORDER — METRONIDAZOLE 500 MG PO TABS: 500.0000 mg | ORAL_TABLET | Freq: Two times a day (BID) | ORAL | Status: AC

## 2016-01-24 MED ORDER — ACETAMINOPHEN-CODEINE #3 300-30 MG PO TABS: 1.0000 | ORAL_TABLET | ORAL | Status: DC | PRN

## 2016-01-24 MED ORDER — NYSTATIN 100000 UNIT/GM EX CREA: 1.0000 "application " | TOPICAL_CREAM | Freq: Two times a day (BID) | CUTANEOUS | Status: DC

## 2016-01-24 MED ORDER — FLUCONAZOLE 200 MG PO TABS: 200.0000 mg | ORAL_TABLET | Freq: Once | ORAL | Status: DC

## 2016-01-24 NOTE — Patient Instructions (Signed)
Diabetes and Exercise Exercising regularly is important. It is not just about losing weight. It has many health benefits, such as:  Improving your overall fitness, flexibility, and endurance.  Increasing your bone density.  Helping with weight control.  Decreasing your body fat.  Increasing your muscle strength.  Reducing stress and tension.  Improving your overall health. People with diabetes who exercise gain additional benefits because exercise:  Reduces appetite.  Improves the body's use of blood sugar (glucose).  Helps lower or control blood glucose.  Decreases blood pressure.  Helps control blood lipids (such as cholesterol and triglycerides).  Improves the body's use of the hormone insulin by:  Increasing the body's insulin sensitivity.  Reducing the body's insulin needs.  Decreases the risk for heart disease because exercising:  Lowers cholesterol and triglycerides levels.  Increases the levels of good cholesterol (such as high-density lipoproteins [HDL]) in the body.  Lowers blood glucose levels. YOUR ACTIVITY PLAN  Choose an activity that you enjoy, and set realistic goals. To exercise safely, you should begin practicing any new physical activity slowly, and gradually increase the intensity of the exercise over time. Your health care provider or diabetes educator can help create an activity plan that works for you. General recommendations include:  Encouraging children to engage in at least 60 minutes of physical activity each day.  Stretching and performing strength training exercises, such as yoga or weight lifting, at least 2 times per week.  Performing a total of at least 150 minutes of moderate-intensity exercise each week, such as brisk walking or water aerobics.  Exercising at least 3 days per week, making sure you allow no more than 2 consecutive days to pass without exercising.  Avoiding long periods of inactivity (90 minutes or more). When you  have to spend an extended period of time sitting down, take frequent breaks to walk or stretch. RECOMMENDATIONS FOR EXERCISING WITH TYPE 1 OR TYPE 2 DIABETES   Check your blood glucose before exercising. If blood glucose levels are greater than 240 mg/dL, check for urine ketones. Do not exercise if ketones are present.  Avoid injecting insulin into areas of the body that are going to be exercised. For example, avoid injecting insulin into:  The arms when playing tennis.  The legs when jogging.  Keep a record of:  Food intake before and after you exercise.  Expected peak times of insulin action.  Blood glucose levels before and after you exercise.  The type and amount of exercise you have done.  Review your records with your health care provider. Your health care provider will help you to develop guidelines for adjusting food intake and insulin amounts before and after exercising.  If you take insulin or oral hypoglycemic agents, watch for signs and symptoms of hypoglycemia. They include:  Dizziness.  Shaking.  Sweating.  Chills.  Confusion.  Drink plenty of water while you exercise to prevent dehydration or heat stroke. Body water is lost during exercise and must be replaced.  Talk to your health care provider before starting an exercise program to make sure it is safe for you. Remember, almost any type of activity is better than none.   This information is not intended to replace advice given to you by your health care provider. Make sure you discuss any questions you have with your health care provider.   Document Released: 12/27/2003 Document Revised: 02/20/2015 Document Reviewed: 03/15/2013 Elsevier Interactive Patient Education 2016 [employer] Basic Carbohydrate Counting for Diabetes Mellitus Carbohydrate counting   is a method for keeping track of the amount of carbohydrates you eat. Eating carbohydrates naturally increases the level of sugar (glucose) in your  blood, so it is important for you to know the amount that is okay for you to have in every meal. Carbohydrate counting helps keep the level of glucose in your blood within normal limits. The amount of carbohydrates allowed is different for every person. A dietitian can help you calculate the amount that is right for you. Once you know the amount of carbohydrates you can have, you can count the carbohydrates in the foods you want to eat. Carbohydrates are found in the following foods:  Grains, such as breads and cereals.  Dried beans and soy products.  Starchy vegetables, such as potatoes, peas, and corn.  Fruit and fruit juices.  Milk and yogurt.  Sweets and snack foods, such as cake, cookies, candy, chips, soft drinks, and fruit drinks. CARBOHYDRATE COUNTING There are two ways to count the carbohydrates in your food. You can use either of the methods or a combination of both. Reading the "Nutrition Facts" on Packaged Food The "Nutrition Facts" is an area that is included on the labels of almost all packaged food and beverages in the [place]. It includes the serving size of that food or beverage and information about the nutrients in each serving of the food, including the grams (g) of carbohydrate per serving.  Decide the number of servings of this food or beverage that you will be able to eat or drink. Multiply that number of servings by the number of grams of carbohydrate that is listed on the label for that serving. The total will be the amount of carbohydrates you will be having when you eat or drink this food or beverage. Learning Standard Serving Sizes of Food When you eat food that is not packaged or does not include "Nutrition Facts" on the label, you need to measure the servings in order to count the amount of carbohydrates.A serving of most carbohydrate-rich foods contains about 15 g of carbohydrates. The following list includes serving sizes of carbohydrate-rich foods that  provide 15 g ofcarbohydrate per serving:   1 slice of bread (1 oz) or 1 six-inch tortilla.    of a hamburger bun or English muffin.  4-6 crackers.   cup unsweetened dry cereal.    cup hot cereal.   cup rice or pasta.    cup mashed potatoes or  of a large baked potato.  1 cup fresh fruit or one small piece of fruit.    cup canned or frozen fruit or fruit juice.  1 cup milk.   cup plain fat-free yogurt or yogurt sweetened with artificial sweeteners.   cup cooked dried beans or starchy vegetable, such as peas, corn, or potatoes.  Decide the number of standard-size servings that you will eat. Multiply that number of servings by 15 (the grams of carbohydrates in that serving). For example, if you eat 2 cups of strawberries, you will have eaten 2 servings and 30 g of carbohydrates (2 servings x 15 g = 30 g). For foods such as soups and casseroles, in which more than one food is mixed in, you will need to count the carbohydrates in each food that is included. EXAMPLE OF CARBOHYDRATE COUNTING Sample Dinner  3 oz chicken breast.   cup of brown rice.   cup of corn.  1 cup milk.   1 cup strawberries with sugar-free whipped topping.  Carbohydrate Calculation Step   1: Identify the foods that contain carbohydrates:   Rice.   Corn.   Milk.   Strawberries. Step 2:Calculate the number of servings eaten of each:   2 servings of rice.   1 serving of corn.   1 serving of milk.   1 serving of strawberries. Step 3: Multiply each of those number of servings by 15 g:   2 servings of rice x 15 g = 30 g.   1 serving of corn x 15 g = 15 g.   1 serving of milk x 15 g = 15 g.   1 serving of strawberries x 15 g = 15 g. Step 4: Add together all of the amounts to find the total grams of carbohydrates eaten: 30 g + 15 g + 15 g + 15 g = 75 g.   This information is not intended to replace advice given to you by your health care provider. Make sure you  discuss any questions you have with your health care provider.   Document Released: 10/06/2005 Document Revised: 10/27/2014 Document Reviewed: 09/02/2013 Elsevier Interactive Patient Education 2016 Elsevier Inc.  

## 2016-01-24 NOTE — Progress Notes (Signed)
Patient ID: Tammy Graham, female   DOB: 1951/10/11, 65 y.o.   MRN: [id number]   Tammy Graham, is a 65 y.o. female  [id number]  [id number]  DOB - 07-01-1951  Chief Complaint  Patient presents with  . Vaginal Itching        Subjective:   Tammy Graham is a 65 y.o. female with history of hypertension, coronary artery disease, hyperlipidemia, diabetes mellitus, generalized anxiety and obstructive sleep apnea here today for a follow up visit. She was recently seen for Pap smear, since the procedure patient has been having itching, she denies any major vaginal discharge, no foul odor but intense itching in and outside the vagina. This was worsened by recent shaving. She is not sexually active at the moment although she has a partner but needs to be treated before becoming sexually active. She has no other complaints today. Patient has No headache, No chest pain, No abdominal pain - No Nausea, No new weakness tingling or numbness, No Cough - SOB.  Problem  Vaginal Itching  Midline Low Back Pain Without Sciatica    ALLERGIES: Allergies  Allergen Reactions  . Benadryl [Diphenhydramine Hcl (Sleep)]   . Simvastatin     PAST MEDICAL HISTORY: Past Medical History  Diagnosis Date  . CAD (coronary artery disease)   . HTN (hypertension)   . Hyperlipemia   . DM (diabetes mellitus) ([place])   . Chest pain   . Anxiety   . Myocardial infarction ([place])   . Sleep apnea   . Arthritis     shoulder and knees    MEDICATIONS AT HOME: Prior to Admission medications   Medication Sig Start Date End Date Taking? Authorizing Provider  acetaminophen-codeine (TYLENOL #3) 300-30 MG tablet Take 1 tablet by mouth every 4 (four) hours as needed for moderate pain. 01/24/16  Yes Tresa Garter, MD  amLODipine (NORVASC) 10 MG tablet Take 1 tablet (10 mg total) by mouth daily. 11/01/15  Yes Willeen Niece, MD  aspirin 81 MG tablet Take 1 tablet (81 mg total) by mouth daily. 09/21/14  Yes Tresa Garter, MD  Calcium Carbonate-Vitamin D (CALTRATE 600+D) 600-400 MG-UNIT per tablet Take 1 tablet by mouth daily.   Yes Historical Provider, MD  carvedilol (COREG) 12.5 MG tablet Take 1 tablet (12.5 mg total) by mouth 2 (two) times daily with a meal. 11/01/15  Yes Willeen Niece, MD  diclofenac sodium (VOLTAREN) 1 % GEL Apply 2 g topically 4 (four) times daily. 10/02/15  Yes Tresa Garter, MD  glucose blood (WAVESENSE PRESTO) test strip Use as instructed 03/22/14  Yes Renella Cunas, MD  LANCETS MICRO THIN 33G MISC Use as directed 03/22/14  Yes Renella Cunas, MD  lisinopril-hydrochlorothiazide (PRINZIDE,ZESTORETIC) 20-25 MG tablet Take 1 tablet by mouth daily. 11/01/15  Yes Willeen Niece, MD  metFORMIN (GLUCOPHAGE) 500 MG tablet Take 1 tablet (500 mg total) by mouth 2 (two) times daily with a meal. 11/01/15  Yes Willeen Niece, MD  metroNIDAZOLE (METROGEL) 0.75 % vaginal gel Place 1 Applicatorful vaginally 2 (two) times daily. 12/10/15  Yes Tresa Garter, MD  nitroGLYCERIN (NITROSTAT) 0.4 MG SL tablet Place 1 tablet (0.4 mg total) under the tongue every 5 (five) minutes as needed. 10/02/15  Yes Tresa Garter, MD  prasugrel (EFFIENT) 10 MG TABS tablet Take 1 tablet (10 mg total) by mouth daily. 11/02/15  Yes Tresa Garter, MD  rosuvastatin (CRESTOR) 20 MG tablet Take 1 tablet (20 mg total)  by mouth daily. 11/01/15  Yes Willeen Niece, MD  fluconazole (DIFLUCAN) 200 MG tablet Take 1 tablet (200 mg total) by mouth once. Repeat in one week 01/24/16   Tresa Garter, MD  nystatin cream (MYCOSTATIN) Apply 1 application topically 2 (two) times daily. 01/24/16   Tresa Garter, MD     Objective:   Filed Vitals:   01/24/16 1203  Height: 5\' 6"  (1.676 m)  Weight: 167 lb 3.2 oz (75.841 kg)    Exam General appearance : Awake, alert, not in any distress. Speech Clear. Not toxic looking HEENT: Atraumatic and Normocephalic, pupils equally reactive to light and accomodation Neck: supple,  no JVD. No cervical lymphadenopathy.  Chest:Good air entry bilaterally, no added sounds  CVS: S1 S2 regular, no murmurs.  Abdomen: Bowel sounds present, Non tender and not distended with no gaurding, rigidity or rebound. Extremities: B/L Lower Ext shows no edema, both legs are warm to touch Neurology: Awake alert, and oriented X 3, CN II-XII intact, Non focal  Pelvic Exam: Cervix normal in appearance, external genitalia normal, no adnexal masses or tenderness, no cervical motion tenderness, rectovaginal septum normal, uterus normal size, shape, and consistency and vagina normal without discharge   Data Review Lab Results  Component Value Date   HGBA1C 8.40 11/01/2015   HGBA1C 7.50 02/08/2015   HGBA1C 6.8 09/21/2014     Assessment & Plan   1. Type 2 diabetes mellitus without complication, without long-term current use of insulin (HCC)  - POCT A1C - Glucose (CBG)  Aim for 30 minutes of exercise most days. Rethink what you drink. Water is great! Aim for 2-3 Carb Choices per meal (30-45 grams) +/- 1 either way  Aim for 0-15 Carbs per snack if hungry  Include protein in moderation with your meals and snacks  Consider reading food labels for Total Carbohydrate and Fat Grams of foods  Consider checking BG at alternate times per day  Continue taking medication as directed Be mindful about how much sugar you are adding to beverages and other foods. Fruit Punch - find one with no sugar  Measure and decrease portions of carbohydrate foods  Make your plate and don't go back for seconds   2. Tobacco use Tammy Graham was counseled on the dangers of tobacco use, and was advised to quit. Reviewed strategies to maximize success, including removing cigarettes and smoking materials from environment, stress management and support of family/friends.   3. Vaginal itching - [place], Flagyl for 7 days - fluconazole (DIFLUCAN) 200 MG tablet; Take 1 tablet (200 mg total) by mouth once. Repeat in one  week  Dispense: 1 tablet; Refill: 1 - nystatin cream (MYCOSTATIN); Apply 1 application topically 2 (two) times daily.  Dispense: 30 g; Refill: 3  4. Midline low back pain without sciatica  - acetaminophen-codeine (TYLENOL #3) 300-30 MG tablet; Take 1 tablet by mouth every 4 (four) hours as needed for moderate pain.  Dispense: 60 tablet; Refill: 0  Patient have been counseled extensively about nutrition and exercise  Return in about 3 months (around 04/24/2016) for Hemoglobin A1C and Follow up, DM, Follow up HTN, Follow up Pain and comorbidities.  The patient was given clear instructions to go to ER or return to medical center if symptoms don't improve, worsen or new problems develop. The patient verbalized understanding. The patient was told to call to get lab results if they haven't heard anything in the next week.   This note has been created with Dragon speech  Land. Any transcriptional errors are unintentional.    Angelica Chessman, MD, [place], [place], [place], [place] and [place], [place]   01/24/2016, 12:30 PM

## 2016-01-24 NOTE — Progress Notes (Signed)
Patient is here for vaginal concerns.  Patient complains of itching for a few weeks. Patient used OTC creams with no relief.

## 2016-01-25 ENCOUNTER — Telehealth: Payer: Self-pay | Admitting: Clinical

## 2016-01-25 LAB — CERVICOVAGINAL ANCILLARY ONLY: WET PREP (BD AFFIRM): POSITIVE — AB

## 2016-01-25 NOTE — Telephone Encounter (Signed)
F/u with Tammy Graham, says she is doing well. Tammy Graham aware that [facility] Roselyn Reef is leaving [employer] at end April, and that she may make appointment prior to that time as needed.

## 2016-01-31 ENCOUNTER — Telehealth: Payer: Self-pay | Admitting: *Deleted

## 2016-01-31 NOTE — Telephone Encounter (Signed)
MA unable to leave a message on either contacts.

## 2016-01-31 NOTE — Telephone Encounter (Signed)
-----   Message from Tresa Garter, MD sent at 01/29/2016  3:28 PM EDT ----- Please inform patient that her vaginal swab showed positive bacteria vaginosis, not a sexually transmitted disease. She is already being treated with Flagyl. Encourage patient to complete treatment and report if symptoms persist 2 weeks after.

## 2016-04-16 MED FILL — AMLODIPINE BESYLATE 10 MG T: 10 | 90 days supply | Qty: 90 | Fill #3

## 2016-04-16 MED FILL — LISINOPRIL-HCTZ 20-25 MG TA: 20-25 | 90 days supply | Qty: 90 | Fill #3

## 2016-04-16 MED FILL — CARVEDILOL 12.5 MG TABLET: 12.5 | 90 days supply | Qty: 180 | Fill #3

## 2016-04-16 MED FILL — metFORMIN HCL 500 MG TABS: 500 | 90 days supply | Qty: 180 | Fill #3

## 2016-04-16 MED FILL — EFFIENT 10 MG TABLET: 10 | 90 days supply | Qty: 90 | Fill #0

## 2016-04-24 MED FILL — ROSUVASTATIN CAL 20 MG TAB: 20 | 90 days supply | Qty: 90 | Fill #3

## 2016-05-01 ENCOUNTER — Other Ambulatory Visit: Payer: Self-pay | Admitting: Internal Medicine

## 2016-07-09 ENCOUNTER — Telehealth: Payer: Self-pay | Admitting: Internal Medicine

## 2016-07-09 NOTE — Telephone Encounter (Signed)
Could you or a student provide education regarding metformin?

## 2016-07-09 NOTE — Telephone Encounter (Signed)
Patient called the office to speak with nurse regarding the reaction that she has because of the medicine that she is taking. Medication is metformin. Please follow up.  Thank you.

## 2016-07-10 NOTE — Telephone Encounter (Signed)
Called pt to address her recent phone call regarding side effects of metformin.  Pt reports her vagina has been irritated since January and she attributes this to metformin. She reports when she doesn't take metformin, she feels better.   Pt describes vaginal symptoms of itching, some "spots of blood" on wash cloth. She reports she uses dove soap and water for daily cleansing.   She reports she is using a cream vaginally at the direction of her doctor. Pt states she had another topical medication for the same issue but has run out of it. Pt could not name either medications. Of note, she has voltaren gel, metronidazole and nystatin for topical application on her med list. She states that she uses voltaren gel for her knees only.   Pt reports family hx of cervical CA (mother and daughter).   Advised pt that these symptoms were likely not attributable to metformin use. Asked patient to make an appointment to see Dr. Doreene Burke.   Carlean Jews, Pharm.D. PGY1 Pharmacy Resident 9/21/20171:44 PM Pager [phone]

## 2016-07-10 NOTE — Telephone Encounter (Signed)
Yes - we will call her today.

## 2016-07-10 NOTE — Telephone Encounter (Signed)
Next week appointment

## 2016-07-17 ENCOUNTER — Ambulatory Visit: Payer: Medicare Other | Attending: Internal Medicine | Admitting: Internal Medicine

## 2016-07-17 ENCOUNTER — Encounter: Payer: Self-pay | Admitting: Internal Medicine

## 2016-07-17 VITALS — BP 103/65 | HR 71 | Temp 98.3°F | Ht 66.5 in | Wt 165.4 lb

## 2016-07-17 DIAGNOSIS — Z7982 Long term (current) use of aspirin: Secondary | ICD-10-CM | POA: Insufficient documentation

## 2016-07-17 DIAGNOSIS — N898 Other specified noninflammatory disorders of vagina: Secondary | ICD-10-CM

## 2016-07-17 DIAGNOSIS — I2510 Atherosclerotic heart disease of native coronary artery without angina pectoris: Secondary | ICD-10-CM | POA: Diagnosis not present

## 2016-07-17 DIAGNOSIS — I252 Old myocardial infarction: Secondary | ICD-10-CM | POA: Diagnosis not present

## 2016-07-17 DIAGNOSIS — E785 Hyperlipidemia, unspecified: Secondary | ICD-10-CM

## 2016-07-17 DIAGNOSIS — L298 Other pruritus: Secondary | ICD-10-CM | POA: Diagnosis not present

## 2016-07-17 DIAGNOSIS — F419 Anxiety disorder, unspecified: Secondary | ICD-10-CM | POA: Diagnosis not present

## 2016-07-17 DIAGNOSIS — I10 Essential (primary) hypertension: Secondary | ICD-10-CM | POA: Diagnosis not present

## 2016-07-17 DIAGNOSIS — E119 Type 2 diabetes mellitus without complications: Secondary | ICD-10-CM | POA: Diagnosis not present

## 2016-07-17 DIAGNOSIS — R300 Dysuria: Secondary | ICD-10-CM | POA: Diagnosis not present

## 2016-07-17 DIAGNOSIS — Z7984 Long term (current) use of oral hypoglycemic drugs: Secondary | ICD-10-CM | POA: Diagnosis not present

## 2016-07-17 DIAGNOSIS — Z888 Allergy status to other drugs, medicaments and biological substances status: Secondary | ICD-10-CM | POA: Insufficient documentation

## 2016-07-17 DIAGNOSIS — G4733 Obstructive sleep apnea (adult) (pediatric): Secondary | ICD-10-CM | POA: Insufficient documentation

## 2016-07-17 DIAGNOSIS — N838 Other noninflammatory disorders of ovary, fallopian tube and broad ligament: Secondary | ICD-10-CM | POA: Insufficient documentation

## 2016-07-17 LAB — GLUCOSE, POCT (MANUAL RESULT ENTRY): POC Glucose: 383 mg/dl — AB (ref 70–99)

## 2016-07-17 LAB — POCT URINALYSIS DIPSTICK
Bilirubin, UA: NEGATIVE
GLUCOSE UA: 1000
Ketones, UA: NEGATIVE
NITRITE UA: NEGATIVE
PH UA: 5
Protein, UA: NEGATIVE
Spec Grav, UA: 1.01
UROBILINOGEN UA: 0.2

## 2016-07-17 LAB — POCT GLYCOSYLATED HEMOGLOBIN (HGB A1C): HEMOGLOBIN A1C: 10

## 2016-07-17 MED ORDER — METFORMIN HCL 1000 MG PO TABS: 1000.0000 mg | ORAL_TABLET | Freq: Two times a day (BID) | ORAL | 3 refills | Status: DC

## 2016-07-17 MED ORDER — PRASUGREL HCL 10 MG PO TABS: 10.0000 mg | ORAL_TABLET | Freq: Every day | ORAL | 3 refills | Status: DC

## 2016-07-17 MED ORDER — CARVEDILOL 12.5 MG PO TABS: 12.5000 mg | ORAL_TABLET | Freq: Two times a day (BID) | ORAL | 3 refills | Status: DC

## 2016-07-17 MED ORDER — FLUCONAZOLE 200 MG PO TABS
200.0000 mg | ORAL_TABLET | Freq: Once | ORAL | 0 refills | Status: AC
Start: 2016-07-17 — End: 2016-07-17

## 2016-07-17 MED ORDER — ROSUVASTATIN CALCIUM 20 MG PO TABS: 20.0000 mg | ORAL_TABLET | Freq: Every day | ORAL | 3 refills | Status: DC

## 2016-07-17 MED ORDER — METRONIDAZOLE 0.75 % VA GEL: VAGINAL | 0 refills | Status: DC

## 2016-07-17 MED ORDER — METRONIDAZOLE 500 MG PO TABS: 500.0000 mg | ORAL_TABLET | Freq: Two times a day (BID) | ORAL | 0 refills | Status: DC

## 2016-07-17 MED ORDER — LISINOPRIL-HYDROCHLOROTHIAZIDE 20-25 MG PO TABS: 1.0000 | ORAL_TABLET | Freq: Every day | ORAL | 3 refills | Status: DC

## 2016-07-17 MED ORDER — AMLODIPINE BESYLATE 10 MG PO TABS: 10.0000 mg | ORAL_TABLET | Freq: Every day | ORAL | 3 refills | Status: DC

## 2016-07-17 MED FILL — LISINOPRIL-HCTZ 20-25 MG TA: 20-25 | 90 days supply | Qty: 90 | Fill #0

## 2016-07-17 MED FILL — ROSUVASTATIN CALCIUM 20 MG: 20 | 90 days supply | Qty: 90 | Fill #0 | Status: TO

## 2016-07-17 MED FILL — CARVEDILOL 12.5 MG TABLET: 12.5 | 90 days supply | Qty: 180 | Fill #0

## 2016-07-17 MED FILL — AMLODIPINE BESYLATE 10 MG T: 10 | 90 days supply | Qty: 90 | Fill #0

## 2016-07-17 NOTE — Patient Instructions (Addendum)
Blood Glucose Monitoring, Adult Monitoring your blood glucose (also know as blood sugar) helps you to manage your diabetes. It also helps you and your health care provider monitor your diabetes and determine how well your treatment plan is working. WHY SHOULD YOU MONITOR YOUR BLOOD GLUCOSE?  It can help you understand how food, exercise, and medicine affect your blood glucose.  It allows you to know what your blood glucose is at any given moment. You can quickly tell if you are having low blood glucose (hypoglycemia) or high blood glucose (hyperglycemia).  It can help you and your health care provider know how to adjust your medicines.  It can help you understand how to manage an illness or adjust medicine for exercise. WHEN SHOULD YOU TEST? Your health care provider will help you decide how often you should check your blood glucose. This may depend on the type of diabetes you have, your diabetes control, or the types of medicines you are taking. Be sure to write down all of your blood glucose readings so that this information can be reviewed with your health care provider. See below for examples of testing times that your health care provider may suggest. Type 1 Diabetes  Test at least 2 times per day if your diabetes is well controlled, if you are using an insulin pump, or if you perform multiple daily injections.  If your diabetes is not well controlled or if you are sick, you may need to test more often.  It is a good idea to also test:  Before every insulin injection.  Before and after exercise.  Between meals and 2 hours after a meal.  Occasionally between 2:00 a.m. and 3:00 a.m. Type 2 Diabetes  If you are taking insulin, test at least 2 times per day. However, it is best to test before every insulin injection.  If you take medicines by mouth (orally), test 2 times a day.  If you are on a controlled diet, test once a day.  If your diabetes is not well controlled or if you  are sick, you may need to monitor more often. HOW TO MONITOR YOUR BLOOD GLUCOSE Supplies Needed  Blood glucose meter.  Test strips for your meter. Each meter has its own strips. You must use the strips that go with your own meter.  A pricking needle (lancet).  A device that holds the lancet (lancing device).  A journal or log book to write down your results. Procedure  Wash your hands with soap and water. Alcohol is not preferred.  Prick the side of your finger (not the tip) with the lancet.  Gently milk the finger until a small drop of blood appears.  Follow the instructions that come with your meter for inserting the test strip, applying blood to the strip, and using your blood glucose meter. Other Areas to Get Blood for Testing Some meters allow you to use other areas of your body (other than your finger) to test your blood. These areas are called alternative sites. The most common alternative sites are:  The forearm.  The thigh.  The back area of the lower leg.  The palm of the hand. The blood flow in these areas is slower. Therefore, the blood glucose values you get may be delayed, and the numbers are different from what you would get from your fingers. Do not use alternative sites if you think you are having hypoglycemia. Your reading will not be accurate. Always use a finger if you are  having hypoglycemia. Also, if you cannot feel your lows (hypoglycemia unawareness), always use your fingers for your blood glucose checks. ADDITIONAL TIPS FOR GLUCOSE MONITORING  Do not reuse lancets.  Always carry your supplies with you.  All blood glucose meters have a 24-hour "hotline" number to call if you have questions or need help.  Adjust (calibrate) your blood glucose meter with a control solution after finishing a few boxes of strips. BLOOD GLUCOSE RECORD KEEPING It is a good idea to keep a daily record or log of your blood glucose readings. Most glucose meters, if not all,  keep your glucose records stored in the meter. Some meters come with the ability to download your records to your home computer. Keeping a record of your blood glucose readings is especially helpful if you are wanting to look for patterns. Make notes to go along with the blood glucose readings because you might forget what happened at that exact time. Keeping good records helps you and your health care provider to work together to achieve good diabetes management.    This information is not intended to replace advice given to you by your health care provider. Make sure you discuss any questions you have with your health care provider.   Document Released: 10/09/2003 Document Revised: 10/27/2014 Document Reviewed: 02/28/2013 Elsevier Interactive Patient Education 2016 [place] Carbohydrate Counting for Diabetes Mellitus Carbohydrate counting is a method for keeping track of the amount of carbohydrates you eat. Eating carbohydrates naturally increases the level of sugar (glucose) in your blood, so it is important for you to know the amount that is okay for you to have in every meal. Carbohydrate counting helps keep the level of glucose in your blood within normal limits. The amount of carbohydrates allowed is different for every person. A dietitian can help you calculate the amount that is right for you. Once you know the amount of carbohydrates you can have, you can count the carbohydrates in the foods you want to eat. Carbohydrates are found in the following foods:  Grains, such as breads and cereals.  Dried beans and soy products.  Starchy vegetables, such as potatoes, peas, and corn.  Fruit and fruit juices.  Milk and yogurt.  Sweets and snack foods, such as cake, cookies, candy, chips, soft drinks, and fruit drinks. CARBOHYDRATE COUNTING There are two ways to count the carbohydrates in your food. You can use either of the methods or a combination of both. Reading the "Nutrition  Facts" on [place] The "Nutrition Facts" is an area that is included on the labels of almost all packaged food and beverages in the [place]. It includes the serving size of that food or beverage and information about the nutrients in each serving of the food, including the grams (g) of carbohydrate per serving.  Decide the number of servings of this food or beverage that you will be able to eat or drink. Multiply that number of servings by the number of grams of carbohydrate that is listed on the label for that serving. The total will be the amount of carbohydrates you will be having when you eat or drink this food or beverage. Learning Standard Serving Sizes of Food When you eat food that is not packaged or does not include "Nutrition Facts" on the label, you need to measure the servings in order to count the amount of carbohydrates.A serving of most carbohydrate-rich foods contains about 15 g of carbohydrates. The following list includes serving sizes of carbohydrate-rich  foods that provide 15 g ofcarbohydrate per serving:   1 slice of bread (1 oz) or 1 six-inch tortilla.    of a hamburger bun or English muffin.  4-6 crackers.   cup unsweetened dry cereal.    cup hot cereal.   cup rice or pasta.    cup mashed potatoes or  of a large baked potato.  1 cup fresh fruit or one small piece of fruit.    cup canned or frozen fruit or fruit juice.  1 cup milk.   cup plain fat-free yogurt or yogurt sweetened with artificial sweeteners.   cup cooked dried beans or starchy vegetable, such as peas, corn, or potatoes.  Decide the number of standard-size servings that you will eat. Multiply that number of servings by 15 (the grams of carbohydrates in that serving). For example, if you eat 2 cups of strawberries, you will have eaten 2 servings and 30 g of carbohydrates (2 servings x 15 g = 30 g). For foods such as soups and casseroles, in which more than one food is mixed in,  you will need to count the carbohydrates in each food that is included. EXAMPLE OF CARBOHYDRATE COUNTING Sample Dinner  3 oz chicken breast.   cup of brown rice.   cup of corn.  1 cup milk.   1 cup strawberries with sugar-free whipped topping.  Carbohydrate Calculation Step 1: Identify the foods that contain carbohydrates:   Rice.   Corn.   Milk.   Strawberries. Step 2:Calculate the number of servings eaten of each:   2 servings of rice.   1 serving of corn.   1 serving of milk.   1 serving of strawberries. Step 3: Multiply each of those number of servings by 15 g:   2 servings of rice x 15 g = 30 g.   1 serving of corn x 15 g = 15 g.   1 serving of milk x 15 g = 15 g.   1 serving of strawberries x 15 g = 15 g. Step 4: Add together all of the amounts to find the total grams of carbohydrates eaten: 30 g + 15 g + 15 g + 15 g = 75 g.   This information is not intended to replace advice given to you by your health care provider. Make sure you discuss any questions you have with your health care provider.   Document Released: 10/06/2005 Document Revised: 10/27/2014 Document Reviewed: 09/02/2013 Elsevier Interactive Patient Education 2016 Elsevier [employer] Monilial Vaginitis Vaginitis in a soreness, swelling and redness (inflammation) of the vagina and vulva. Monilial vaginitis is not a sexually transmitted infection. CAUSES  Yeast vaginitis is caused by yeast (candida) that is normally found in your vagina. With a yeast infection, the candida has overgrown in number to a point that upsets the chemical balance. SYMPTOMS   White, thick vaginal discharge.  Swelling, itching, redness and irritation of the vagina and possibly the lips of the vagina (vulva).  Burning or painful urination.  Painful intercourse. DIAGNOSIS  Things that may contribute to monilial vaginitis are:  Postmenopausal and virginal states.  Pregnancy.  Infections.  Being  tired, sick or stressed, especially if you had monilial vaginitis in the past.  Diabetes. Good control will help lower the chance.  Birth control pills.  Tight fitting garments.  Using bubble bath, feminine sprays, douches or deodorant tampons.  Taking certain medications that kill germs (antibiotics).  Sporadic recurrence can occur if you become ill. TREATMENT  Your caregiver will give you medication.  There are several kinds of anti monilial vaginal creams and suppositories specific for monilial vaginitis. For recurrent yeast infections, use a suppository or cream in the vagina 2 times a week, or as directed.  Anti-monilial or steroid cream for the itching or irritation of the vulva may also be used. Get your caregiver's permission.  Painting the vagina with methylene blue solution may help if the monilial cream does not work.  Eating yogurt may help prevent monilial vaginitis. HOME CARE INSTRUCTIONS   Finish all medication as prescribed.  Do not have sex until treatment is completed or after your caregiver tells you it is okay.  Take warm sitz baths.  Do not douche.  Do not use tampons, especially scented ones.  Wear cotton underwear.  Avoid tight pants and panty hose.  Tell your sexual partner that you have a yeast infection. They should go to their caregiver if they have symptoms such as mild rash or itching.  Your sexual partner should be treated as well if your infection is difficult to eliminate.  Practice safer sex. Use condoms.  Some vaginal medications cause latex condoms to fail. Vaginal medications that harm condoms are:  Cleocin cream.  Butoconazole (Femstat).  Terconazole (Terazol) vaginal suppository.  Miconazole (Monistat) (may be purchased over the counter). SEEK MEDICAL CARE IF:   You have a temperature by mouth above 102 F (38.9 C).  The infection is getting worse after 2 days of treatment.  The infection is not getting better after  3 days of treatment.  You develop blisters in or around your vagina.  You develop vaginal bleeding, and it is not your menstrual period.  You have pain when you urinate.  You develop intestinal problems.  You have pain with sexual intercourse.   This information is not intended to replace advice given to you by your health care provider. Make sure you discuss any questions you have with your health care provider.   Document Released: 07/16/2005 Document Revised: 12/29/2011 Document Reviewed: 04/09/2015 Elsevier Interactive Patient Education [employer].

## 2016-07-17 NOTE — Progress Notes (Signed)
"  Bacterial infection in vaginal area"

## 2016-07-17 NOTE — Progress Notes (Signed)
Tammy Graham, is a 65 y.o. female  [id number]  [id number]  DOB - 07-Oct-1951  Chief Complaint  Patient presents with  . Diabetes  . vaginal irritation    "itchy"      Subjective:   Tammy Graham is a 65 y.o. female with history of hypertension, coronary artery disease, hyperlipidemia, diabetes mellitus, generalized anxiety and obstructive sleep apnea here today complaint of vaginal discharge and itching. She had similar complaint in April, was diagnosed with BV and flagyl prescribed but she did not take the medication. She has had on and off discharge since then. She is currently not sexually active but she has the desire to be once she gets treated. Patient has No headache, No chest pain, No abdominal pain - No Nausea, No new weakness tingling or numbness, No Cough - SOB. Patient has not been taking her metformin as prescribed, and she is not adherent with her diet or exercise. She is totally out of metformin for sometimes now. Needs refill today.  ALLERGIES: Allergies  Allergen Reactions  . Benadryl [Diphenhydramine Hcl (Sleep)]   . Simvastatin    PAST MEDICAL HISTORY: Past Medical History:  Diagnosis Date  . Anxiety   . Arthritis    shoulder and knees  . CAD (coronary artery disease)   . Chest pain   . DM (diabetes mellitus) ([place])   . HTN (hypertension)   . Hyperlipemia   . Myocardial infarction ([place])   . Sleep apnea    MEDICATIONS AT HOME: Prior to Admission medications   Medication Sig Start Date End Date Taking? Authorizing Provider  acetaminophen-codeine (TYLENOL #3) 300-30 MG tablet Take 1 tablet by mouth every 4 (four) hours as needed for moderate pain. 01/24/16  Yes Tresa Garter, MD  amLODipine (NORVASC) 10 MG tablet Take 1 tablet (10 mg total) by mouth daily. 11/01/15  Yes Willeen Niece, MD  aspirin 81 MG tablet Take 1 tablet (81 mg total) by mouth daily. 09/21/14  Yes Tresa Garter, MD  Calcium Carbonate-Vitamin D (CALTRATE 600+D) 600-400  MG-UNIT per tablet Take 1 tablet by mouth daily.   Yes Historical Provider, MD  carvedilol (COREG) 12.5 MG tablet Take 1 tablet (12.5 mg total) by mouth 2 (two) times daily with a meal. 11/01/15  Yes Willeen Niece, MD  lisinopril-hydrochlorothiazide (PRINZIDE,ZESTORETIC) 20-25 MG tablet Take 1 tablet by mouth daily. 11/01/15  Yes Willeen Niece, MD  metFORMIN (GLUCOPHAGE) 1000 MG tablet Take 1 tablet (1,000 mg total) by mouth 2 (two) times daily with a meal. 07/17/16  Yes Tresa Garter, MD  nystatin cream (MYCOSTATIN) Apply 1 application topically 2 (two) times daily. 01/24/16  Yes Tresa Garter, MD  prasugrel (EFFIENT) 10 MG TABS tablet Take 1 tablet (10 mg total) by mouth daily. 11/02/15  Yes Tresa Garter, MD  rosuvastatin (CRESTOR) 20 MG tablet Take 1 tablet (20 mg total) by mouth daily. 11/01/15  Yes Willeen Niece, MD  fluconazole (DIFLUCAN) 200 MG tablet Take 1 tablet (200 mg total) by mouth once. Repeat in one week 07/17/16 07/17/16  Tresa Garter, MD  glucose blood (WAVESENSE PRESTO) test strip Use as instructed Patient not taking: Reported on 07/17/2016 03/22/14   Renella Cunas, MD  LANCETS MICRO THIN 33G MISC Use as directed Patient not taking: Reported on 07/17/2016 03/22/14   Renella Cunas, MD  metroNIDAZOLE (FLAGYL) 500 MG tablet Take 1 tablet (500 mg total) by mouth 2 (two) times daily. 07/17/16   Jazlin Tapscott E  Doreene Burke, MD  metroNIDAZOLE (METROGEL) 0.75 % vaginal gel PLACE 1 APPLICATORFUL VAGINALLY TWICE DAILY 07/17/16   Tresa Garter, MD  nitroGLYCERIN (NITROSTAT) 0.4 MG SL tablet Place 1 tablet (0.4 mg total) under the tongue every 5 (five) minutes as needed. Patient not taking: Reported on 07/17/2016 10/02/15   Tresa Garter, MD   Objective:   Vitals:   07/17/16 1403  BP: 103/65  Pulse: 71  Temp: 98.3 F (36.8 C)  TempSrc: Oral  SpO2: 98%  Weight: 165 lb 6.4 oz (75 kg)  Height: 5' 6.5" (1.689 m)   Exam General appearance : Awake, alert, not in any  distress. Speech Clear. Not toxic looking HEENT: Atraumatic and Normocephalic, pupils equally reactive to light and accomodation Neck: Supple, no JVD. No cervical lymphadenopathy.  Chest: Good air entry bilaterally, no added sounds  CVS: S1 S2 regular, no murmurs.  Abdomen: Bowel sounds present, Non tender and not distended with no gaurding, rigidity or rebound. Extremities: B/L Lower Ext shows no edema, both legs are warm to touch Neurology: Awake alert, and oriented X 3, CN II-XII intact, Non focal  Data Review Lab Results  Component Value Date   HGBA1C 10.0 07/17/2016   HGBA1C 8.40 11/01/2015   HGBA1C 7.50 02/08/2015   Assessment & Plan   1. Type 2 diabetes mellitus without complication, without long-term current use of insulin (HCC)  - Glucose (CBG) - HgB A1c  Restart - metFORMIN (GLUCOPHAGE) 1000 MG tablet; Take 1 tablet (1,000 mg total) by mouth 2 (two) times daily with a meal.  Dispense: 180 tablet; Refill: 3 Aim for 30 minutes of exercise most days. Rethink what you drink. Water is great! Aim for 2-3 Carb Choices per meal (30-45 grams) +/- 1 either way  Aim for 0-15 Carbs per snack if hungry  Include protein in moderation with your meals and snacks  Consider reading food labels for Total Carbohydrate and Fat Grams of foods  Consider checking BG at alternate times per day  Continue taking medication as directed Be mindful about how much sugar you are adding to beverages and other foods. Fruit Punch - find one with no sugar  Measure and decrease portions of carbohydrate foods  Make your plate and don't go back for seconds  2. Vaginal itching  - fluconazole (DIFLUCAN) 200 MG tablet; Take 1 tablet (200 mg total) by mouth once. Repeat in one week  Dispense: 2 tablet; Refill: 0 - metroNIDAZOLE (METROGEL) 0.75 % vaginal gel; PLACE 1 APPLICATORFUL VAGINALLY TWICE DAILY  Dispense: 70 g; Refill: 0 - metroNIDAZOLE (FLAGYL) 500 MG tablet; Take 1 tablet (500 mg total) by mouth 2  (two) times daily.  Dispense: 14 tablet; Refill: 0  Patient have been counseled extensively about nutrition and exercise  Return in about 4 weeks (around 08/14/2016) for [facility] for BP/CBG and DM management.  The patient was given clear instructions to go to ER or return to medical center if symptoms don't improve, worsen or new problems develop. The patient verbalized understanding. The patient was told to call to get lab results if they haven't heard anything in the next week.   This note has been created with Surveyor, quantity. Any transcriptional errors are unintentional.    Angelica Chessman, MD, [place], Karilyn Cota, [place] and [place], [place]   07/17/2016, 2:59 PM

## 2016-07-24 ENCOUNTER — Telehealth: Payer: Self-pay | Admitting: Internal Medicine

## 2016-07-24 NOTE — Telephone Encounter (Signed)
Error

## 2016-09-11 ENCOUNTER — Emergency Department (HOSPITAL_COMMUNITY)
Admission: EM | Admit: 2016-09-11 | Discharge: 2016-09-12 | Disposition: A | Discharge status: Home / Self Care | Payer: Medicare Other | Attending: Emergency Medicine | Admitting: Emergency Medicine

## 2016-09-11 ENCOUNTER — Encounter (HOSPITAL_COMMUNITY): Payer: Self-pay

## 2016-09-11 DIAGNOSIS — I10 Essential (primary) hypertension: Secondary | ICD-10-CM | POA: Insufficient documentation

## 2016-09-11 DIAGNOSIS — Z79899 Other long term (current) drug therapy: Secondary | ICD-10-CM | POA: Diagnosis not present

## 2016-09-11 DIAGNOSIS — I252 Old myocardial infarction: Secondary | ICD-10-CM | POA: Insufficient documentation

## 2016-09-11 DIAGNOSIS — T7840XA Allergy, unspecified, initial encounter: Secondary | ICD-10-CM

## 2016-09-11 DIAGNOSIS — I2510 Atherosclerotic heart disease of native coronary artery without angina pectoris: Secondary | ICD-10-CM | POA: Diagnosis not present

## 2016-09-11 DIAGNOSIS — N390 Urinary tract infection, site not specified: Secondary | ICD-10-CM | POA: Diagnosis not present

## 2016-09-11 DIAGNOSIS — L500 Allergic urticaria: Secondary | ICD-10-CM | POA: Insufficient documentation

## 2016-09-11 DIAGNOSIS — Z7982 Long term (current) use of aspirin: Secondary | ICD-10-CM | POA: Insufficient documentation

## 2016-09-11 DIAGNOSIS — F17210 Nicotine dependence, cigarettes, uncomplicated: Secondary | ICD-10-CM | POA: Diagnosis not present

## 2016-09-11 DIAGNOSIS — E119 Type 2 diabetes mellitus without complications: Secondary | ICD-10-CM | POA: Diagnosis not present

## 2016-09-11 DIAGNOSIS — Z955 Presence of coronary angioplasty implant and graft: Secondary | ICD-10-CM | POA: Diagnosis not present

## 2016-09-11 DIAGNOSIS — Z794 Long term (current) use of insulin: Secondary | ICD-10-CM | POA: Insufficient documentation

## 2016-09-11 DIAGNOSIS — R319 Hematuria, unspecified: Secondary | ICD-10-CM

## 2016-09-11 DIAGNOSIS — L509 Urticaria, unspecified: Secondary | ICD-10-CM

## 2016-09-11 MED ORDER — SODIUM CHLORIDE 0.9 % IV BOLUS (SEPSIS)
1000.0000 mL | Freq: Once | INTRAVENOUS | Status: AC
  Administered 2016-09-11: 1000 mL via INTRAVENOUS

## 2016-09-11 NOTE — ED Provider Notes (Signed)
[facility] DEPT Provider Note   CSN: [id number] Arrival date & time: 09/11/16  2134  History   Chief Complaint Chief Complaint  Patient presents with  . Urticaria  . Angioedema   HPI Tammy Graham is a 65 y.o. female. HPI   Tammy Graham is a 65 y.o. female with h/o DM, HTN, cardiac stent who presents to the Emergency Department complaining of moderate pruritus and swelling to the lips that began at 4 pm this afternoon with associated SOB. Pt states her SOB has currently resolved. She also complains of a rash to BUE and back for months that worsened this evening. No new soaps, lotions, detergents, foods, animals, plants, changes in medications. Pt reports she has not been checking her CBGs recently as she ran out of strips. She is currently taking Lisinopril. She denies tongue swelling, CP.   Past Medical History:  Diagnosis Date  . Anxiety   . Arthritis    shoulder and knees  . CAD (coronary artery disease)   . Chest pain   . DM (diabetes mellitus) ([place])   . HTN (hypertension)   . Hyperlipemia   . Myocardial infarction   . Sleep apnea     Patient Active Problem List   Diagnosis Date Noted  . Vaginal itching 01/24/2016  . Midline low back pain without sciatica 01/24/2016  . Type 2 diabetes mellitus without complication, without long-term current use of insulin ([place]) 12/06/2015  . Pap smear for cervical cancer screening 12/06/2015  . Dyslipidemia 11/29/2015  . Cough 11/01/2015  . Hip pain 02/08/2015  . Essential hypertension 03/22/2014  . Mixed hyperlipidemia 03/22/2014  . Tobacco use 03/22/2014  . Type 2 diabetes mellitus ([place]) 03/22/2014  . Anxiety 03/22/2014  . CAD (coronary artery disease) 05/06/2011    Past Surgical History:  Procedure Laterality Date  . CARDIAC CATHETERIZATION    . COLONOSCOPY    . COLONOSCOPY  09/25/2011   Procedure: COLONOSCOPY;  Surgeon: Winfield Cunas., MD;  Location: [facility];  Service: Endoscopy;  Laterality: N/A;  .  CORONARY STENT PLACEMENT    . Left knee surgery      OB History    No data available       Home Medications    Prior to Admission medications   Medication Sig Start Date End Date Taking? Authorizing Provider  acetaminophen-codeine (TYLENOL #3) 300-30 MG tablet Take 1 tablet by mouth every 4 (four) hours as needed for moderate pain. 01/24/16  Yes Tresa Garter, MD  amLODipine (NORVASC) 10 MG tablet Take 1 tablet (10 mg total) by mouth daily. 07/17/16  Yes Tresa Garter, MD  aspirin 81 MG tablet Take 1 tablet (81 mg total) by mouth daily. 09/21/14  Yes Tresa Garter, MD  carvedilol (COREG) 12.5 MG tablet Take 1 tablet (12.5 mg total) by mouth 2 (two) times daily with a meal. 07/17/16  Yes Tresa Garter, MD  glucose blood (WAVESENSE PRESTO) test strip Use as instructed 03/22/14  Yes Renella Cunas, MD  LANCETS MICRO THIN 33G MISC Use as directed 03/22/14  Yes Renella Cunas, MD  metFORMIN (GLUCOPHAGE) 1000 MG tablet Take 1 tablet (1,000 mg total) by mouth 2 (two) times daily with a meal. 07/17/16  Yes Tresa Garter, MD  Multiple Vitamin (MULTIVITAMIN WITH MINERALS) TABS tablet Take 1 tablet by mouth daily.   Yes Historical Provider, MD  nitroGLYCERIN (NITROSTAT) 0.4 MG SL tablet Place 1 tablet (0.4 mg total) under the tongue every 5 (  five) minutes as needed. Patient taking differently: Place 0.4 mg under the tongue every 5 (five) minutes as needed for chest pain.  10/02/15  Yes Tresa Garter, MD  nystatin cream (MYCOSTATIN) Apply 1 application topically 2 (two) times daily. Patient taking differently: Apply 1 application topically 2 (two) times daily as needed for dry skin.  01/24/16  Yes Tresa Garter, MD  prasugrel (EFFIENT) 10 MG TABS tablet Take 1 tablet (10 mg total) by mouth daily. 07/17/16  Yes Tresa Garter, MD  rosuvastatin (CRESTOR) 20 MG tablet Take 1 tablet (20 mg total) by mouth daily. 07/17/16  Yes Tresa Garter, MD  famotidine (PEPCID) 20  MG tablet Take 1 tablet (20 mg total) by mouth 2 (two) times daily. 09/12/16   Daulton Harbaugh Carlota Raspberry, PA-C  hydrochlorothiazide (HYDRODIURIL) 25 MG tablet Take 1 tablet (25 mg total) by mouth daily. 09/12/16   Trong Gosling Carlota Raspberry, PA-C  hydrOXYzine (ATARAX/VISTARIL) 25 MG tablet Take 1 tablet (25 mg total) by mouth every 6 (six) hours. 09/12/16   Delos Haring, PA-C    Family History Family History  Problem Relation Age of Onset  . Anesthesia problems Neg Hx   . Hypotension Neg Hx   . Malignant hyperthermia Neg Hx   . Pseudochol deficiency Neg Hx     Social History Social History  Substance Use Topics  . Smoking status: Current Every Day Smoker    Packs/day: 0.25    Years: 15.00    Types: Cigarettes    Last attempt to quit: 12/03/2015  . Smokeless tobacco: Never Used     Comment: 4-5 daily  . Alcohol use 0.6 oz/week    1 Shots of liquor per week     Comment: 3 weeks ago     Allergies   Benadryl [diphenhydramine hcl (sleep)] and Simvastatin   Review of Systems Review of Systems  Review of Systems All other systems negative except as documented in the HPI. All pertinent positives and negatives as reviewed in the HPI.  Physical Exam Updated Vital Signs BP 114/70   Pulse 69   Temp 97.9 F (36.6 C)   Resp 18   Ht 5\' 6"  (1.676 m)   Wt 72.6 kg   SpO2 94%   BMI 25.82 kg/m   Physical Exam  Constitutional: She appears well-developed and well-nourished. No distress.  HENT:  Head: Normocephalic and atraumatic.  Eyes: Pupils are equal, round, and reactive to light.  Neck: Normal range of motion. Neck supple.  Cardiovascular: Normal rate and regular rhythm.   Pulmonary/Chest: Effort normal.  Abdominal: Soft.  Neurological: She is alert.  Skin: Skin is warm and dry.  No distress. Speaking in full sentences. Mild swelling of upper and lower lip. Tongue and uvula normal. Oropharynx clear. Diffuse urticarial rash to arms and trunk   Nursing note and vitals reviewed.    ED  Treatments / Results  Labs (all labs ordered are listed, but only abnormal results are displayed) Labs Reviewed  BASIC METABOLIC PANEL - Abnormal; Notable for the following:       Result Value   Glucose, Bld 229 (*)    Creatinine, Ser 1.11 (*)    GFR calc non Af Amer 51 (*)    GFR calc Af Amer 59 (*)    All other components within normal limits  URINALYSIS, ROUTINE W REFLEX MICROSCOPIC (NOT AT [facility]) - Abnormal; Notable for the following:    APPearance CLOUDY (*)    Glucose, UA >1000 (*)  Hgb urine dipstick MODERATE (*)    Leukocytes, UA MODERATE (*)    All other components within normal limits  URINE MICROSCOPIC-ADD ON - Abnormal; Notable for the following:    Squamous Epithelial / LPF 0-5 (*)    Bacteria, UA RARE (*)    All other components within normal limits  CBC WITH DIFFERENTIAL/PLATELET    EKG  EKG Interpretation None       Radiology No results found.  Procedures Procedures (including critical care time)  Medications Ordered in ED Medications  sodium chloride 0.9 % bolus 1,000 mL (0 mLs Intravenous Stopped 09/12/16 0106)  famotidine (PEPCID) IVPB 20 mg premix (0 mg Intravenous Stopped 09/12/16 0130)  hydrOXYzine (ATARAX/VISTARIL) tablet 25 mg (25 mg Oral Given 09/12/16 0106)     Initial Impression / Assessment and Plan / ED Course  I have reviewed the triage vital signs and the nursing notes.  Pertinent labs & imaging results that were available during my care of the patient were reviewed by me and considered in my medical decision making (see chart for details).  Clinical Course    2:51 AM Pt reevaluated. She reports her itching and lip swelling have improved, but her upper lip continues to feel mildly swollen. She denies CP, SOB, tongue swelling.  Patient did not get abx for UTI, called in by flow manager.  Atarax, Pepcid, HCTZ and dc Lisinopril.    Final Clinical Impressions(s) / ED Diagnoses   Final diagnoses:  Hives  Allergic reaction,  initial encounter  Urinary tract infection with hematuria, site unspecified    New Prescriptions Discharge Medication List as of 09/12/2016  3:13 AM    START taking these medications   Details  famotidine (PEPCID) 20 MG tablet Take 1 tablet (20 mg total) by mouth 2 (two) times daily., Starting Fri 09/12/2016, Print    hydrochlorothiazide (HYDRODIURIL) 25 MG tablet Take 1 tablet (25 mg total) by mouth daily., Starting Fri 09/12/2016, Print    hydrOXYzine (ATARAX/VISTARIL) 25 MG tablet Take 1 tablet (25 mg total) by mouth every 6 (six) hours., Starting Fri 09/12/2016, Print         Delos Haring, PA-C 09/12/16 [id number]    Ezequiel Essex, MD 09/12/16 2039

## 2016-09-11 NOTE — ED Triage Notes (Signed)
Pt also adds lip swelling that started today, mild lip swelling to R side of lip, pt is on a ACE since 2012

## 2016-09-11 NOTE — ED Triage Notes (Signed)
Pt states that she started itching and breaking out and hives on Monday, they went away and came back today, pt has some hives on back and under breast, denies SOB, pt also c/o of itching in vaginal region for over one year. Denies new medications or soaps. Pt unable to take benadryl cause it also causes hives.

## 2016-09-11 NOTE — ED Provider Notes (Signed)
Subjective:  The history is provided by the patient. No language interpreter was used.   Tammy Graham is a 65 y.o. female with h/o DM, HTN, cardiac stent who presents to the Emergency Department complaining of moderate pruritus and swelling to the lips that began at 4 pm this afternoon with associated SOB. Pt states her SOB has currently resolved. She also complains of a rash to BUE and back for months that worsened this evening. No new soaps, lotions, detergents, foods, animals, plants, changes in medications. Pt reports she has not been checking her CBGs recently as she ran out of strips. She is currently taking Lisinopril. She denies tongue swelling, CP.   Physical Examination: No distress. Speaking in full sentences. Mild swelling of upper and lower lip. Tongue and uvula normal. Oropharynx clear. Diffuse urticarial rash to arms and trunk   2:51 AM Pt reevaluated. She reports her itching and lip swelling have improved, but her upper lip continues to feel mildly swollen. She denies CP, SOB, tongue swelling.   Monitor, treat allergic reaction. Stop ACE.  By signing my name below, I, Hansel Feinstein, attest that this documentation has been prepared under the direction and in the presence of Ezequiel Essex, MD. Electronically Signed: Hansel Feinstein, Scribe. 09/11/16. 11:18 PM.   I personally performed the services described in this documentation, which was scribed in my presence. The recorded information has been reviewed and is accurate.    Ezequiel Essex, MD 09/12/16 2039

## 2016-09-12 LAB — CBC WITH DIFFERENTIAL/PLATELET
Basophils Absolute: 0 10*3/uL (ref 0.0–0.1)
Basophils Relative: 0 %
EOS PCT: 2 %
Eosinophils Absolute: 0.2 10*3/uL (ref 0.0–0.7)
HEMATOCRIT: 42.1 % (ref 36.0–46.0)
Hemoglobin: 14.7 g/dL (ref 12.0–15.0)
LYMPHS ABS: 2.9 10*3/uL (ref 0.7–4.0)
LYMPHS PCT: 35 %
MCH: 32.8 pg (ref 26.0–34.0)
MCHC: 34.9 g/dL (ref 30.0–36.0)
MCV: 94 fL (ref 78.0–100.0)
MONO ABS: 0.4 10*3/uL (ref 0.1–1.0)
Monocytes Relative: 4 %
NEUTROS ABS: 4.7 10*3/uL (ref 1.7–7.7)
Neutrophils Relative %: 59 %
PLATELETS: 214 10*3/uL (ref 150–400)
RBC: 4.48 MIL/uL (ref 3.87–5.11)
RDW: 13.3 % (ref 11.5–15.5)
WBC: 8.2 10*3/uL (ref 4.0–10.5)

## 2016-09-12 LAB — URINALYSIS, ROUTINE W REFLEX MICROSCOPIC
Bilirubin Urine: NEGATIVE
Ketones, ur: NEGATIVE mg/dL
Nitrite: NEGATIVE
PH: 5 (ref 5.0–8.0)
PROTEIN: NEGATIVE mg/dL
Specific Gravity, Urine: 1.026 (ref 1.005–1.030)

## 2016-09-12 LAB — BASIC METABOLIC PANEL
Anion gap: 9 (ref 5–15)
BUN: 14 mg/dL (ref 6–20)
CALCIUM: 10 mg/dL (ref 8.9–10.3)
CO2: 24 mmol/L (ref 22–32)
Chloride: 106 mmol/L (ref 101–111)
Creatinine, Ser: 1.11 mg/dL — ABNORMAL HIGH (ref 0.44–1.00)
GFR calc Af Amer: 59 mL/min — ABNORMAL LOW (ref >60)
GFR, EST NON AFRICAN AMERICAN: 51 mL/min — AB (ref >60)
GLUCOSE: 229 mg/dL — AB (ref 65–99)
POTASSIUM: 4.1 mmol/L (ref 3.5–5.1)
Sodium: 139 mmol/L (ref 135–145)

## 2016-09-12 LAB — URINE MICROSCOPIC-ADD ON

## 2016-09-12 MED ORDER — FAMOTIDINE 20 MG PO TABS: 20.0000 mg | ORAL_TABLET | Freq: Two times a day (BID) | ORAL | 0 refills | Status: DC

## 2016-09-12 MED ORDER — HYDROCHLOROTHIAZIDE 25 MG PO TABS: 25.0000 mg | ORAL_TABLET | Freq: Every day | ORAL | 0 refills | Status: DC

## 2016-09-12 MED ORDER — FAMOTIDINE IN NACL 20-0.9 MG/50ML-% IV SOLN
20.0000 mg | Freq: Once | INTRAVENOUS | Status: AC
  Administered 2016-09-12: 20 mg via INTRAVENOUS
  Filled 2016-09-12: qty 50

## 2016-09-12 MED ORDER — HYDROXYZINE HCL 25 MG PO TABS: 25.0000 mg | ORAL_TABLET | Freq: Four times a day (QID) | ORAL | 0 refills | Status: DC

## 2016-09-12 MED ORDER — HYDROXYZINE HCL 25 MG PO TABS
25.0000 mg | ORAL_TABLET | Freq: Once | ORAL | Status: AC
  Administered 2016-09-12: 25 mg via ORAL
  Filled 2016-09-12: qty 1

## 2016-11-12 ENCOUNTER — Encounter: Payer: Self-pay | Admitting: Internal Medicine

## 2016-11-12 ENCOUNTER — Ambulatory Visit: Payer: Medicare Other | Attending: Internal Medicine | Admitting: Internal Medicine

## 2016-11-12 VITALS — BP 131/83 | HR 76 | Temp 98.2°F | Resp 18 | Ht 66.5 in | Wt 165.6 lb

## 2016-11-12 DIAGNOSIS — Z888 Allergy status to other drugs, medicaments and biological substances status: Secondary | ICD-10-CM | POA: Diagnosis not present

## 2016-11-12 DIAGNOSIS — G4730 Sleep apnea, unspecified: Secondary | ICD-10-CM | POA: Insufficient documentation

## 2016-11-12 DIAGNOSIS — N390 Urinary tract infection, site not specified: Secondary | ICD-10-CM | POA: Insufficient documentation

## 2016-11-12 DIAGNOSIS — B373 Candidiasis of vulva and vagina: Secondary | ICD-10-CM | POA: Insufficient documentation

## 2016-11-12 DIAGNOSIS — Z720 Tobacco use: Secondary | ICD-10-CM

## 2016-11-12 DIAGNOSIS — Z951 Presence of aortocoronary bypass graft: Secondary | ICD-10-CM | POA: Insufficient documentation

## 2016-11-12 DIAGNOSIS — F419 Anxiety disorder, unspecified: Secondary | ICD-10-CM | POA: Diagnosis not present

## 2016-11-12 DIAGNOSIS — L298 Other pruritus: Secondary | ICD-10-CM

## 2016-11-12 DIAGNOSIS — I10 Essential (primary) hypertension: Secondary | ICD-10-CM | POA: Diagnosis not present

## 2016-11-12 DIAGNOSIS — E119 Type 2 diabetes mellitus without complications: Secondary | ICD-10-CM | POA: Diagnosis not present

## 2016-11-12 DIAGNOSIS — F17210 Nicotine dependence, cigarettes, uncomplicated: Secondary | ICD-10-CM | POA: Insufficient documentation

## 2016-11-12 DIAGNOSIS — I2510 Atherosclerotic heart disease of native coronary artery without angina pectoris: Secondary | ICD-10-CM | POA: Diagnosis not present

## 2016-11-12 DIAGNOSIS — I252 Old myocardial infarction: Secondary | ICD-10-CM | POA: Insufficient documentation

## 2016-11-12 DIAGNOSIS — Z955 Presence of coronary angioplasty implant and graft: Secondary | ICD-10-CM | POA: Diagnosis not present

## 2016-11-12 DIAGNOSIS — E785 Hyperlipidemia, unspecified: Secondary | ICD-10-CM

## 2016-11-12 DIAGNOSIS — N898 Other specified noninflammatory disorders of vagina: Secondary | ICD-10-CM

## 2016-11-12 LAB — POCT GLYCOSYLATED HEMOGLOBIN (HGB A1C): Hemoglobin A1C: 8.7

## 2016-11-12 LAB — GLUCOSE, POCT (MANUAL RESULT ENTRY): POC GLUCOSE: 230 mg/dL — AB (ref 70–99)

## 2016-11-12 MED ORDER — HYDROCHLOROTHIAZIDE 25 MG PO TABS: 25.0000 mg | ORAL_TABLET | Freq: Every day | ORAL | 3 refills | Status: DC

## 2016-11-12 MED ORDER — CLOTRIMAZOLE 2 % VA CREA: 1.0000 | TOPICAL_CREAM | Freq: Every day | VAGINAL | 0 refills | Status: DC

## 2016-11-12 MED ORDER — METFORMIN HCL 1000 MG PO TABS: 1000.0000 mg | ORAL_TABLET | Freq: Two times a day (BID) | ORAL | 3 refills | Status: DC

## 2016-11-12 MED ORDER — GLUCOSE BLOOD VI STRP
ORAL_STRIP | 12 refills | Status: DC
Start: 2016-11-12 — End: 2016-11-14

## 2016-11-12 MED ORDER — TRUEPLUS LANCETS 28G MISC
1.0000 | Freq: Three times a day (TID) | 12 refills | Status: DC
Start: 2016-11-12 — End: 2016-11-14

## 2016-11-12 MED ORDER — CARVEDILOL 12.5 MG PO TABS: 12.5000 mg | ORAL_TABLET | Freq: Two times a day (BID) | ORAL | 3 refills | Status: DC

## 2016-11-12 MED ORDER — AMLODIPINE BESYLATE 10 MG PO TABS: 10.0000 mg | ORAL_TABLET | Freq: Every day | ORAL | 3 refills | Status: DC

## 2016-11-12 MED ORDER — PRASUGREL HCL 10 MG PO TABS: 10.0000 mg | ORAL_TABLET | Freq: Every day | ORAL | 3 refills | Status: DC

## 2016-11-12 MED ORDER — ROSUVASTATIN CALCIUM 20 MG PO TABS: 20.0000 mg | ORAL_TABLET | Freq: Every day | ORAL | 3 refills | Status: DC

## 2016-11-12 MED ORDER — FLUCONAZOLE 200 MG PO TABS: 200.0000 mg | ORAL_TABLET | Freq: Once | ORAL | 1 refills | Status: AC

## 2016-11-12 MED ORDER — TRUE METRIX METER W/DEVICE KIT: 1.0000 | PACK | Freq: Three times a day (TID) | 0 refills | Status: DC

## 2016-11-12 MED FILL — FLUCONAZOLE 200 MG TABLET: 200 | 1 days supply | Qty: 1 | Fill #0

## 2016-11-12 MED FILL — metFORMIN HCL 1000 MG TABS: 1000 | 30 days supply | Qty: 60 | Fill #0

## 2016-11-12 MED FILL — CARVEDILOL 12.5 MG TABLET: 12.5 | 30 days supply | Qty: 60 | Fill #0

## 2016-11-12 MED FILL — AMLODIPINE BESYLATE 10 MG T: 10 | 30 days supply | Qty: 30 | Fill #0

## 2016-11-12 NOTE — Patient Instructions (Signed)
Dyslipidemia Dyslipidemia is an imbalance of waxy, fat-like substances (lipids) in the blood. The body needs lipids in small amounts. Dyslipidemia often involves a high level of cholesterol or triglycerides, which are types of lipids. Common forms of dyslipidemia include:  High levels of bad cholesterol (LDL cholesterol). LDL is the type of cholesterol that causes fatty deposits (plaques) to build up in the blood vessels that carry blood away from your heart (arteries).  Low levels of good cholesterol (HDL cholesterol). HDL cholesterol is the type of cholesterol that protects against heart disease. High levels of HDL remove the LDL buildup from arteries.  High levels of triglycerides. Triglycerides are a fatty substance in the blood that is linked to a buildup of plaques in the arteries. You can develop dyslipidemia because of the genes you are born with (primary dyslipidemia) or changes that occur during your life (secondary dyslipidemia), or as a side effect of certain medical treatments. What are the causes? Primary dyslipidemia is caused by changes (mutations) in genes that are passed down through families (inherited). These mutations cause several types of dyslipidemia. Mutations can result in disorders that make the body produce too much LDL cholesterol or triglycerides, or not enough HDL cholesterol. These disorders may lead to heart disease, arterial disease, or stroke at an early age. Causes of secondary dyslipidemia include certain lifestyle choices and diseases that lead to dyslipidemia, such as:  Eating a diet that is high in animal fat.  Not getting enough activity or exercise (having a sedentary lifestyle).  Having diabetes, kidney disease, liver disease, or thyroid disease.  Drinking large amounts of alcohol.  Using certain types of drugs. What increases the risk? You may be at greater risk for dyslipidemia if you are an older man or if you are a woman who has gone through  menopause. Other risk factors include:  Having a family history of dyslipidemia.  Taking certain medicines, including birth control pills, steroids, some diuretics, beta-blockers, and some medicines forHIV.  Smoking cigarettes.  Eating a high-fat diet.  Drinking large amounts of alcohol.  Having certain medical conditions such as diabetes, polycystic ovary syndrome (PCOS), pregnancy, kidney disease, liver disease, or hypothyroidism.  Not exercising regularly.  Being overweight or obese with too much belly fat. What are the signs or symptoms? Dyslipidemia does not usually cause any symptoms. Very high lipid levels can cause fatty bumps under the skin (xanthomas) or a white or gray ring around the black center (pupil) of the eye. Very high triglyceride levels can cause inflammation of the pancreas (pancreatitis). How is this diagnosed? Your health care provider may diagnose dyslipidemia based on a routine blood test (fasting blood test). Because most people do not have symptoms of the condition, this blood testing (lipid profile) is done on adults age 47 and older and is repeated every 5 years. This test checks:  Total cholesterol. This is a measure of the total amount of cholesterol in your blood, including LDL cholesterol, HDL cholesterol, and triglycerides. A healthy number is below 200.  LDL cholesterol. The target number for LDL cholesterol is different for each person, depending on individual risk factors. For most people, a number below 100 is healthy. Ask your health care provider what your LDL cholesterol number should be.  HDL cholesterol. An HDL level of 60 or higher is best because it helps to protect against heart disease. A number below 68 for men or below 69 for women increases the risk for heart disease.  Triglycerides. A healthy triglyceride number  is below 150. If your lipid profile is abnormal, your health care provider may do other blood tests to get more information  about your condition. How is this treated? Treatment depends on the type of dyslipidemia that you have and your other risk factors for heart disease and stroke. Your health care provider will have a target range for your lipid levels based on this information. For many people, treatment starts with lifestyle changes, such as diet and exercise. Your health care provider may recommend that you:  Get regular exercise.  Make changes to your diet.  Quit smoking if you smoke. If diet changes and exercise do not help you reach your goals, your health care provider may also prescribe medicine to lower lipids. The most commonly prescribed type of medicine lowers your LDL cholesterol (statin drug). If you have a high triglyceride level, your provider may prescribe another type of drug (fibrate) or an omega-3 fish oil supplement, or both. Follow these instructions at home:  Take over-the-counter and prescription medicines only as told by your health care provider. This includes supplements.  Get regular exercise. Start an aerobic exercise and strength training program as told by your health care provider. Ask your health care provider what activities are safe for you. Your health care provider may recommend:  30 minutes of aerobic activity 4-6 days a week. Brisk walking is an example of aerobic activity.  Strength training 2 days a week.  Eat a healthy diet as told by your health care provider. This can help you reach and maintain a healthy weight, lower your LDL cholesterol, and raise your HDL cholesterol. It may help to work with a diet and nutrition specialist (dietitian) to make a plan that is right for you. Your dietitian or health care provider may recommend:  Limiting your calories, if you are overweight.  Eating more fruits, vegetables, whole grains, fish, and lean meats.  Limiting saturated fat, trans fat, and cholesterol.  Follow instructions from your health care provider or dietitian  about eating or drinking restrictions.  Limit alcohol intake to no more than one drink per day for nonpregnant women and two drinks per day for men. One drink equals 12 oz of beer, 5 oz of wine, or 1 oz of hard liquor.  Do not use any products that contain nicotine or tobacco, such as cigarettes and e-cigarettes. If you need help quitting, ask your health care provider.  Keep all follow-up visits as told by your health care provider. This is important. Contact a health care provider if:  You are having trouble sticking to your exercise or diet plan.  You are struggling to quit smoking or control your use of alcohol. Summary  Dyslipidemia is an imbalance of waxy, fat-like substances (lipids) in the blood. The body needs lipids in small amounts. Dyslipidemia often involves a high level of cholesterol or triglycerides, which are types of lipids.  Treatment depends on the type of dyslipidemia that you have and your other risk factors for heart disease and stroke.  For many people, treatment starts with lifestyle changes, such as diet and exercise. Your health care provider may also prescribe medicine to lower lipids. This information is not intended to replace advice given to you by your health care provider. Make sure you discuss any questions you have with your health care provider. Document Released: 10/11/2013 Document Revised: 06/02/2016 Document Reviewed: 06/02/2016 Elsevier Interactive Patient Education  2017 [employer]. Diabetes Mellitus and Exercise Exercising regularly is important for your overall  health, especially when you have diabetes (diabetes mellitus). Exercising is not only about losing weight. It has many health benefits, such as increasing muscle strength and bone density and reducing body fat and stress. This leads to improved fitness, flexibility, and endurance, all of which result in better overall health. Exercise has additional benefits for people with diabetes,  including:  Reducing appetite.  Helping to lower and control blood glucose.  Lowering blood pressure.  Helping to control amounts of fatty substances (lipids) in the blood, such as cholesterol and triglycerides.  Helping the body to respond better to insulin (improving insulin sensitivity).  Reducing how much insulin the body needs.  Decreasing the risk for heart disease by:  Lowering cholesterol and triglyceride levels.  Increasing the levels of good cholesterol.  Lowering blood glucose levels. What is my activity plan? Your health care provider or certified diabetes educator can help you make a plan for the type and frequency of exercise (activity plan) that works for you. Make sure that you:  Do at least 150 minutes of moderate-intensity or vigorous-intensity exercise each week. This could be brisk walking, biking, or water aerobics.  Do stretching and strength exercises, such as yoga or weightlifting, at least 2 times a week.  Spread out your activity over at least 3 days of the week.  Get some form of physical activity every day.  Do not go more than 2 days in a row without some kind of physical activity.  Avoid being inactive for more than 90 minutes at a time. Take frequent breaks to walk or stretch.  Choose a type of exercise or activity that you enjoy, and set realistic goals.  Start slowly, and gradually increase the intensity of your exercise over time. What do I need to know about managing my diabetes?  Check your blood glucose before and after exercising.  If your blood glucose is higher than 240 mg/dL (13.3 mmol/L) before you exercise, check your urine for ketones. If you have ketones in your urine, do not exercise until your blood glucose returns to normal.  Know the symptoms of low blood glucose (hypoglycemia) and how to treat it. Your risk for hypoglycemia increases during and after exercise. Common symptoms of hypoglycemia can  include:  Hunger.  Anxiety.  Sweating and feeling clammy.  Confusion.  Dizziness or feeling light-headed.  Increased heart rate or palpitations.  Blurry vision.  Tingling or numbness around the mouth, lips, or tongue.  Tremors or shakes.  Irritability.  Keep a rapid-acting carbohydrate snack available before, during, and after exercise to help prevent or treat hypoglycemia.  Avoid injecting insulin into areas of the body that are going to be exercised. For example, avoid injecting insulin into:  The arms, when playing tennis.  The legs, when jogging.  Keep records of your exercise habits. Doing this can help you and your health care provider adjust your diabetes management plan as needed. Write down:  Food that you eat before and after you exercise.  Blood glucose levels before and after you exercise.  The type and amount of exercise you have done.  When your insulin is expected to peak, if you use insulin. Avoid exercising at times when your insulin is peaking.  When you start a new exercise or activity, work with your health care provider to make sure the activity is safe for you, and to adjust your insulin, medicines, or food intake as needed.  Drink plenty of water while you exercise to prevent dehydration or heat  stroke. Drink enough fluid to keep your urine clear or pale yellow. This information is not intended to replace advice given to you by your health care provider. Make sure you discuss any questions you have with your health care provider. Document Released: 12/27/2003 Document Revised: 04/25/2016 Document Reviewed: 03/17/2016 Elsevier Interactive Patient Education  2017 [employer]. Diabetes and Foot Care Diabetes may cause you to have problems because of poor blood supply (circulation) to your feet and legs. This may cause the skin on your feet to become thinner, break easier, and heal more slowly. Your skin may become dry, and the skin may peel and  crack. You may also have nerve damage in your legs and feet causing decreased feeling in them. You may not notice minor injuries to your feet that could lead to infections or more serious problems. Taking care of your feet is one of the most important things you can do for yourself. Follow these instructions at home:  Wear shoes at all times, even in the house. Do not go barefoot. Bare feet are easily injured.  Check your feet daily for blisters, cuts, and redness. If you cannot see the bottom of your feet, use a mirror or ask someone for help.  Wash your feet with warm water (do not use hot water) and mild soap. Then pat your feet and the areas between your toes until they are completely dry. Do not soak your feet as this can dry your skin.  Apply a moisturizing lotion or petroleum jelly (that does not contain alcohol and is unscented) to the skin on your feet and to dry, brittle toenails. Do not apply lotion between your toes.  Trim your toenails straight across. Do not dig under them or around the cuticle. File the edges of your nails with an emery board or nail file.  Do not cut corns or calluses or try to remove them with medicine.  Wear clean socks or stockings every day. Make sure they are not too tight. Do not wear knee-high stockings since they may decrease blood flow to your legs.  Wear shoes that fit properly and have enough cushioning. To break in new shoes, wear them for just a few hours a day. This prevents you from injuring your feet. Always look in your shoes before you put them on to be sure there are no objects inside.  Do not cross your legs. This may decrease the blood flow to your feet.  If you find a minor scrape, cut, or break in the skin on your feet, keep it and the skin around it clean and dry. These areas may be cleansed with mild soap and water. Do not cleanse the area with peroxide, alcohol, or iodine.  When you remove an adhesive bandage, be sure not to damage the  skin around it.  If you have a wound, look at it several times a day to make sure it is healing.  Do not use heating pads or hot water bottles. They may burn your skin. If you have lost feeling in your feet or legs, you may not know it is happening until it is too late.  Make sure your health care provider performs a complete foot exam at least annually or more often if you have foot problems. Report any cuts, sores, or bruises to your health care provider immediately. Contact a health care provider if:  You have an injury that is not healing.  You have cuts or breaks in the skin.  You have an ingrown nail.  You notice redness on your legs or feet.  You feel burning or tingling in your legs or feet.  You have pain or cramps in your legs and feet.  Your legs or feet are numb.  Your feet always feel cold. Get help right away if:  There is increasing redness, swelling, or pain in or around a wound.  There is a red line that goes up your leg.  Pus is coming from a wound.  You develop a fever or as directed by your health care provider.  You notice a bad smell coming from an ulcer or wound. This information is not intended to replace advice given to you by your health care provider. Make sure you discuss any questions you have with your health care provider. Document Released: 10/03/2000 Document Revised: 03/13/2016 Document Reviewed: 03/15/2013 Elsevier Interactive Patient Education  2017 [employer]. Blood Glucose Monitoring, Adult Monitoring your blood sugar (glucose) helps you manage your diabetes. It also helps you and your health care provider determine how well your diabetes management plan is working. Blood glucose monitoring involves checking your blood glucose as often as directed, and keeping a record (log) of your results over time. Why should I monitor my blood glucose? Checking your blood glucose regularly can:  Help you understand how food, exercise, illnesses,  and medicines affect your blood glucose.  Let you know what your blood glucose is at any time. You can quickly tell if you are having low blood glucose (hypoglycemia) or high blood glucose (hyperglycemia).  Help you and your health care provider adjust your medicines as needed. When should I check my blood glucose? Follow instructions from your health care provider about how often to check your blood glucose. This may depend on:  The type of diabetes you have.  How well-controlled your diabetes is.  Medicines you are taking. If you have type 1 diabetes:  Check your blood glucose at least 2 times a day.  Also check your blood glucose:  Before every insulin injection.  Before and after exercise.  Between meals.  2 hours after a meal.  Occasionally between 2:00 a.m. and 3:00 a.m., as directed.  Before potentially dangerous tasks, like driving or using heavy machinery.  At bedtime.  You may need to check your blood glucose more often, up to 6-10 times a day:  If you use an insulin pump.  If you need multiple daily injections (MDI).  If your diabetes is not well-controlled.  If you are ill.  If you have a history of severe hypoglycemia.  If you have a history of not knowing when your blood glucose is getting low (hypoglycemia unawareness). If you have type 2 diabetes:  If you take insulin or other diabetes medicines, check your blood glucose at least 2 times a day.  If you are on intensive insulin therapy, check your blood glucose at least 4 times a day. Occasionally, you may also need to check between 2:00 a.m. and 3:00 a.m., as directed.  Also check your blood glucose:  Before and after exercise.  Before potentially dangerous tasks, like driving or using heavy machinery.  You may need to check your blood glucose more often if:  Your medicine is being adjusted.  Your diabetes is not well-controlled.  You are ill. What is a blood glucose log?  A blood  glucose log is a record of your blood glucose readings. It helps you and your health care provider:  Look for  patterns in your blood glucose over time.  Adjust your diabetes management plan as needed.  Every time you check your blood glucose, write down your result and notes about things that may be affecting your blood glucose, such as your diet and exercise for the day.  Most glucose meters store a record of glucose readings in the meter. Some meters allow you to download your records to a computer. How do I check my blood glucose? Follow these steps to get accurate readings of your blood glucose: Supplies needed   Blood glucose meter.  Test strips for your meter. Each meter has its own strips. You must use the strips that come with your meter.  A needle to prick your finger (lancet). Do not use lancets more than once.  A device that holds the lancet (lancing device).  A journal or log book to write down your results. Procedure  Wash your hands with soap and water.  Prick the side of your finger (not the tip) with the lancet. Use a different finger each time.  Gently rub the finger until a small drop of blood appears.  Follow instructions that come with your meter for inserting the test strip, applying blood to the strip, and using your blood glucose meter.  Write down your result and any notes. Alternative testing sites  Some meters allow you to use areas of your body other than your finger (alternative sites) to test your blood.  If you think you may have hypoglycemia, or if you have hypoglycemia unawareness, do not use alternative sites. Use your finger instead.  Alternative sites may not be as accurate as the fingers, because blood flow is slower in these areas. This means that the result you get may be delayed, and it may be different from the result that you would get from your finger.  The most common alternative sites are:  Forearm.  Thigh.  Palm of the  hand. Additional tips  Always keep your supplies with you.  If you have questions or need help, all blood glucose meters have a 24-hour "hotline" number that you can call. You may also contact your health care provider.  After you use a few boxes of test strips, adjust (calibrate) your blood glucose meter by following instructions that came with your meter. This information is not intended to replace advice given to you by your health care provider. Make sure you discuss any questions you have with your health care provider. Document Released: 10/09/2003 Document Revised: 04/25/2016 Document Reviewed: 03/17/2016 Elsevier Interactive Patient Education  2017 [employer].

## 2016-11-12 NOTE — Progress Notes (Signed)
Patient is here for a HFU  Patient complains of lower back pain being present.  Patient states she was given the generic version of Crestor and began to have a reaction.  Patient denies any suicidal ideations at this time.
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
## 2016-11-14 ENCOUNTER — Other Ambulatory Visit: Payer: Self-pay | Admitting: *Deleted

## 2016-11-17 MED ORDER — GLUCOSE BLOOD VI STRP: ORAL_STRIP | 12 refills | Status: DC

## 2016-11-17 MED ORDER — ONETOUCH ULTRA SYSTEM W/DEVICE KIT: 1.0000 | PACK | Freq: Once | 0 refills | Status: AC

## 2016-11-17 MED ORDER — ONETOUCH DELICA LANCETS 33G MISC: 1.0000 | Freq: Three times a day (TID) | 12 refills | Status: DC

## 2016-11-17 MED FILL — ONE TOUCH ULTRA TEST STRIPS: 25 days supply | Qty: 100 | Fill #0

## 2016-11-17 MED FILL — ONE TOUCH DELICA 33G LANCET: 25 days supply | Qty: 100 | Fill #0

## 2016-11-17 MED FILL — ONE TOUCH ULTRAMINI METER: W/DEVICE | 1 days supply | Qty: 1 | Fill #0

## 2016-11-17 NOTE — Telephone Encounter (Signed)
One touch supplies have been sent to the pharmacy.

## 2016-11-20 ENCOUNTER — Other Ambulatory Visit: Payer: Self-pay | Admitting: *Deleted

## 2016-11-20 ENCOUNTER — Telehealth: Payer: Self-pay | Admitting: Internal Medicine

## 2016-11-20 NOTE — Telephone Encounter (Signed)
Patient is aware of Crestor being approved by insurance. Patient is going to pick up the prescription from the [facility].

## 2016-12-01 ENCOUNTER — Other Ambulatory Visit: Payer: Self-pay | Admitting: Internal Medicine

## 2016-12-01 DIAGNOSIS — E785 Hyperlipidemia, unspecified: Secondary | ICD-10-CM

## 2016-12-23 MED FILL — CRESTOR 20 MG TABLET: 20 | 30 days supply | Qty: 30 | Fill #0 | Status: TO

## 2016-12-23 MED FILL — EFFIENT 10 MG TABLET: 10 | 30 days supply | Qty: 30 | Fill #0

## 2016-12-23 MED FILL — AMLODIPINE BESYLATE 10 MG T: 10 | 30 days supply | Qty: 30 | Fill #1

## 2016-12-23 MED FILL — LISINOPRIL-HCTZ 20-25 MG TA: 20-25 | 90 days supply | Qty: 90 | Fill #1

## 2016-12-23 MED FILL — CARVEDILOL 12.5 MG TABLET: 12.5 | 90 days supply | Qty: 180 | Fill #1

## 2017-01-21 ENCOUNTER — Ambulatory Visit: Payer: Medicare Other | Admitting: Internal Medicine

## 2017-01-28 ENCOUNTER — Ambulatory Visit: Payer: Medicare Other | Attending: Internal Medicine | Admitting: Internal Medicine

## 2017-01-28 VITALS — BP 121/61 | HR 78 | Temp 98.1°F | Resp 18 | Ht 67.0 in

## 2017-01-28 DIAGNOSIS — Z7984 Long term (current) use of oral hypoglycemic drugs: Secondary | ICD-10-CM | POA: Insufficient documentation

## 2017-01-28 DIAGNOSIS — M19019 Primary osteoarthritis, unspecified shoulder: Secondary | ICD-10-CM | POA: Insufficient documentation

## 2017-01-28 DIAGNOSIS — Z888 Allergy status to other drugs, medicaments and biological substances status: Secondary | ICD-10-CM | POA: Diagnosis not present

## 2017-01-28 DIAGNOSIS — E119 Type 2 diabetes mellitus without complications: Secondary | ICD-10-CM | POA: Diagnosis not present

## 2017-01-28 DIAGNOSIS — G4733 Obstructive sleep apnea (adult) (pediatric): Secondary | ICD-10-CM | POA: Diagnosis not present

## 2017-01-28 DIAGNOSIS — R300 Dysuria: Secondary | ICD-10-CM

## 2017-01-28 DIAGNOSIS — N390 Urinary tract infection, site not specified: Secondary | ICD-10-CM | POA: Insufficient documentation

## 2017-01-28 DIAGNOSIS — I10 Essential (primary) hypertension: Secondary | ICD-10-CM | POA: Diagnosis not present

## 2017-01-28 DIAGNOSIS — I2510 Atherosclerotic heart disease of native coronary artery without angina pectoris: Secondary | ICD-10-CM | POA: Insufficient documentation

## 2017-01-28 DIAGNOSIS — I252 Old myocardial infarction: Secondary | ICD-10-CM | POA: Insufficient documentation

## 2017-01-28 DIAGNOSIS — M170 Bilateral primary osteoarthritis of knee: Secondary | ICD-10-CM | POA: Diagnosis not present

## 2017-01-28 DIAGNOSIS — E785 Hyperlipidemia, unspecified: Secondary | ICD-10-CM

## 2017-01-28 DIAGNOSIS — Z7982 Long term (current) use of aspirin: Secondary | ICD-10-CM | POA: Insufficient documentation

## 2017-01-28 LAB — POCT URINALYSIS DIPSTICK
Bilirubin, UA: NEGATIVE
Glucose, UA: >=1000
Ketones, UA: NEGATIVE
Protein, UA: 30
Spec Grav, UA: 1.015
Urobilinogen, UA: 1 U/dL
pH, UA: 5

## 2017-01-28 LAB — POCT GLYCOSYLATED HEMOGLOBIN (HGB A1C): HEMOGLOBIN A1C: 8.9

## 2017-01-28 LAB — GLUCOSE, POCT (MANUAL RESULT ENTRY): POC Glucose: 400 mg/dl — AB (ref 70–99)

## 2017-01-28 MED ORDER — GLIPIZIDE 10 MG PO TABS: 10.0000 mg | ORAL_TABLET | Freq: Two times a day (BID) | ORAL | 3 refills | Status: DC

## 2017-01-28 MED ORDER — CEPHALEXIN 500 MG PO CAPS: 500.0000 mg | ORAL_CAPSULE | Freq: Four times a day (QID) | ORAL | 0 refills | Status: DC

## 2017-01-28 MED ORDER — FLUCONAZOLE 150 MG PO TABS: 150.0000 mg | ORAL_TABLET | Freq: Once | ORAL | 0 refills | Status: AC

## 2017-01-28 MED FILL — CEPHALEXIN 500 MG CAPSULE: 500 | 3 days supply | Qty: 14 | Fill #0

## 2017-01-28 MED FILL — EFFIENT 10 MG TABLET: 10 | 30 days supply | Qty: 30 | Fill #1

## 2017-01-28 MED FILL — glipiZIDE 10 MG TABS: 10 | 30 days supply | Qty: 60 | Fill #0

## 2017-01-28 MED FILL — AMLODIPINE BESYLATE 10 MG T: 10 | 30 days supply | Qty: 30 | Fill #2

## 2017-01-28 MED FILL — FLUCONAZOLE 150 MG TABLET: 150 | 8 days supply | Qty: 2 | Fill #0

## 2017-01-28 NOTE — Patient Instructions (Signed)
Diabetes and Foot Care Diabetes may cause you to have problems because of poor blood supply (circulation) to your feet and legs. This may cause the skin on your feet to become thinner, break easier, and heal more slowly. Your skin may become dry, and the skin may peel and crack. You may also have nerve damage in your legs and feet causing decreased feeling in them. You may not notice minor injuries to your feet that could lead to infections or more serious problems. Taking care of your feet is one of the most important things you can do for yourself. Follow these instructions at home:  Wear shoes at all times, even in the house. Do not go barefoot. Bare feet are easily injured.  Check your feet daily for blisters, cuts, and redness. If you cannot see the bottom of your feet, use a mirror or ask someone for help.  Wash your feet with warm water (do not use hot water) and mild soap. Then pat your feet and the areas between your toes until they are completely dry. Do not soak your feet as this can dry your skin.  Apply a moisturizing lotion or petroleum jelly (that does not contain alcohol and is unscented) to the skin on your feet and to dry, brittle toenails. Do not apply lotion between your toes.  Trim your toenails straight across. Do not dig under them or around the cuticle. File the edges of your nails with an emery board or nail file.  Do not cut corns or calluses or try to remove them with medicine.  Wear clean socks or stockings every day. Make sure they are not too tight. Do not wear knee-high stockings since they may decrease blood flow to your legs.  Wear shoes that fit properly and have enough cushioning. To break in new shoes, wear them for just a few hours a day. This prevents you from injuring your feet. Always look in your shoes before you put them on to be sure there are no objects inside.  Do not cross your legs. This may decrease the blood flow to your feet.  If you find a  minor scrape, cut, or break in the skin on your feet, keep it and the skin around it clean and dry. These areas may be cleansed with mild soap and water. Do not cleanse the area with peroxide, alcohol, or iodine.  When you remove an adhesive bandage, be sure not to damage the skin around it.  If you have a wound, look at it several times a day to make sure it is healing.  Do not use heating pads or hot water bottles. They may burn your skin. If you have lost feeling in your feet or legs, you may not know it is happening until it is too late.  Make sure your health care provider performs a complete foot exam at least annually or more often if you have foot problems. Report any cuts, sores, or bruises to your health care provider immediately. Contact a health care provider if:  You have an injury that is not healing.  You have cuts or breaks in the skin.  You have an ingrown nail.  You notice redness on your legs or feet.  You feel burning or tingling in your legs or feet.  You have pain or cramps in your legs and feet.  Your legs or feet are numb.  Your feet always feel cold. Get help right away if:  There is increasing   redness, swelling, or pain in or around a wound.  There is a red line that goes up your leg.  Pus is coming from a wound.  You develop a fever or as directed by your health care provider.  You notice a bad smell coming from an ulcer or wound. This information is not intended to replace advice given to you by your health care provider. Make sure you discuss any questions you have with your health care provider. Document Released: 10/03/2000 Document Revised: 03/13/2016 Document Reviewed: 03/15/2013 Elsevier Interactive Patient Education  2017 [employer]. Diabetes Mellitus and Exercise Exercising regularly is important for your overall health, especially when you have diabetes (diabetes mellitus). Exercising is not only about losing weight. It has many health  benefits, such as increasing muscle strength and bone density and reducing body fat and stress. This leads to improved fitness, flexibility, and endurance, all of which result in better overall health. Exercise has additional benefits for people with diabetes, including:  Reducing appetite.  Helping to lower and control blood glucose.  Lowering blood pressure.  Helping to control amounts of fatty substances (lipids) in the blood, such as cholesterol and triglycerides.  Helping the body to respond better to insulin (improving insulin sensitivity).  Reducing how much insulin the body needs.  Decreasing the risk for heart disease by:  Lowering cholesterol and triglyceride levels.  Increasing the levels of good cholesterol.  Lowering blood glucose levels. What is my activity plan? Your health care provider or certified diabetes educator can help you make a plan for the type and frequency of exercise (activity plan) that works for you. Make sure that you:  Do at least 150 minutes of moderate-intensity or vigorous-intensity exercise each week. This could be brisk walking, biking, or water aerobics.  Do stretching and strength exercises, such as yoga or weightlifting, at least 2 times a week.  Spread out your activity over at least 3 days of the week.  Get some form of physical activity every day.  Do not go more than 2 days in a row without some kind of physical activity.  Avoid being inactive for more than 90 minutes at a time. Take frequent breaks to walk or stretch.  Choose a type of exercise or activity that you enjoy, and set realistic goals.  Start slowly, and gradually increase the intensity of your exercise over time. What do I need to know about managing my diabetes?  Check your blood glucose before and after exercising.  If your blood glucose is higher than 240 mg/dL (13.3 mmol/L) before you exercise, check your urine for ketones. If you have ketones in your urine, do  not exercise until your blood glucose returns to normal.  Know the symptoms of low blood glucose (hypoglycemia) and how to treat it. Your risk for hypoglycemia increases during and after exercise. Common symptoms of hypoglycemia can include:  Hunger.  Anxiety.  Sweating and feeling clammy.  Confusion.  Dizziness or feeling light-headed.  Increased heart rate or palpitations.  Blurry vision.  Tingling or numbness around the mouth, lips, or tongue.  Tremors or shakes.  Irritability.  Keep a rapid-acting carbohydrate snack available before, during, and after exercise to help prevent or treat hypoglycemia.  Avoid injecting insulin into areas of the body that are going to be exercised. For example, avoid injecting insulin into:  The arms, when playing tennis.  The legs, when jogging.  Keep records of your exercise habits. Doing this can help you and your health  care provider adjust your diabetes management plan as needed. Write down:  Food that you eat before and after you exercise.  Blood glucose levels before and after you exercise.  The type and amount of exercise you have done.  When your insulin is expected to peak, if you use insulin. Avoid exercising at times when your insulin is peaking.  When you start a new exercise or activity, work with your health care provider to make sure the activity is safe for you, and to adjust your insulin, medicines, or food intake as needed.  Drink plenty of water while you exercise to prevent dehydration or heat stroke. Drink enough fluid to keep your urine clear or pale yellow. This information is not intended to replace advice given to you by your health care provider. Make sure you discuss any questions you have with your health care provider. Document Released: 12/27/2003 Document Revised: 04/25/2016 Document Reviewed: 03/17/2016 Elsevier Interactive Patient Education  2017 [place]. Blood Glucose Monitoring,  Adult Monitoring your blood sugar (glucose) helps you manage your diabetes. It also helps you and your health care provider determine how well your diabetes management plan is working. Blood glucose monitoring involves checking your blood glucose as often as directed, and keeping a record (log) of your results over time. Why should I monitor my blood glucose? Checking your blood glucose regularly can:  Help you understand how food, exercise, illnesses, and medicines affect your blood glucose.  Let you know what your blood glucose is at any time. You can quickly tell if you are having low blood glucose (hypoglycemia) or high blood glucose (hyperglycemia).  Help you and your health care provider adjust your medicines as needed. When should I check my blood glucose? Follow instructions from your health care provider about how often to check your blood glucose. This may depend on:  The type of diabetes you have.  How well-controlled your diabetes is.  Medicines you are taking. If you have type 1 diabetes:   Check your blood glucose at least 2 times a day.  Also check your blood glucose:  Before every insulin injection.  Before and after exercise.  Between meals.  2 hours after a meal.  Occasionally between 2:00 a.m. and 3:00 a.m., as directed.  Before potentially dangerous tasks, like driving or using heavy machinery.  At bedtime.  You may need to check your blood glucose more often, up to 6-10 times a day:  If you use an insulin pump.  If you need multiple daily injections (MDI).  If your diabetes is not well-controlled.  If you are ill.  If you have a history of severe hypoglycemia.  If you have a history of not knowing when your blood glucose is getting low (hypoglycemia unawareness). If you have type 2 diabetes:   If you take insulin or other diabetes medicines, check your blood glucose at least 2 times a day.  If you are on intensive insulin therapy, check your  blood glucose at least 4 times a day. Occasionally, you may also need to check between 2:00 a.m. and 3:00 a.m., as directed.  Also check your blood glucose:  Before and after exercise.  Before potentially dangerous tasks, like driving or using heavy machinery.  You may need to check your blood glucose more often if:  Your medicine is being adjusted.  Your diabetes is not well-controlled.  You are ill. What is a blood glucose log?  A blood glucose log is a record of your blood  glucose readings. It helps you and your health care provider:  Look for patterns in your blood glucose over time.  Adjust your diabetes management plan as needed.  Every time you check your blood glucose, write down your result and notes about things that may be affecting your blood glucose, such as your diet and exercise for the day.  Most glucose meters store a record of glucose readings in the meter. Some meters allow you to download your records to a computer. How do I check my blood glucose? Follow these steps to get accurate readings of your blood glucose: Supplies needed    Blood glucose meter.  Test strips for your meter. Each meter has its own strips. You must use the strips that come with your meter.  A needle to prick your finger (lancet). Do not use lancets more than once.  A device that holds the lancet (lancing device).  A journal or log book to write down your results. Procedure   Wash your hands with soap and water.  Prick the side of your finger (not the tip) with the lancet. Use a different finger each time.  Gently rub the finger until a small drop of blood appears.  Follow instructions that come with your meter for inserting the test strip, applying blood to the strip, and using your blood glucose meter.  Write down your result and any notes. Alternative testing sites   Some meters allow you to use areas of your body other than your finger (alternative sites) to test your  blood.  If you think you may have hypoglycemia, or if you have hypoglycemia unawareness, do not use alternative sites. Use your finger instead.  Alternative sites may not be as accurate as the fingers, because blood flow is slower in these areas. This means that the result you get may be delayed, and it may be different from the result that you would get from your finger.  The most common alternative sites are:  Forearm.  Thigh.  Palm of the hand. Additional tips   Always keep your supplies with you.  If you have questions or need help, all blood glucose meters have a 24-hour "hotline" number that you can call. You may also contact your health care provider.  After you use a few boxes of test strips, adjust (calibrate) your blood glucose meter by following instructions that came with your meter. This information is not intended to replace advice given to you by your health care provider. Make sure you discuss any questions you have with your health care provider. Document Released: 10/09/2003 Document Revised: 04/25/2016 Document Reviewed: 03/17/2016 Elsevier Interactive Patient Education  2017 [employer].

## 2017-01-28 NOTE — Progress Notes (Signed)
Tammy Graham, is a 66 y.o. female  GYK:[id number]  BLT:[id number]  DOB - 12/28/1950  No chief complaint on file.      Subjective:   Tammy Graham is a 66 y.o. female with history of hypertension, coronary artery disease, hyperlipidemia, diabetes mellitus, generalized anxiety and obstructive sleep apnea here today for a follow up visit. She is complaining of dysuria and frequency with much discomfort. No fever, no hematuria, no abdominal pain, no n/v/d, no blood in stool. She claims adherence to her medications but not diet or exercise. She always want to do the "right thing" but somehow she finds herself eating unhealthy diet. She will continue to try. BP is controlled. She denies any other symptom today. She denies any suicidal ideation or thought. Patient has No headache, No chest pain, No new weakness tingling or numbness, No Cough - SOB.  Problem  Dysuria    ALLERGIES: Allergies  Allergen Reactions  . Benadryl [Diphenhydramine Hcl (Sleep)] Other (See Comments)    unknown  . Rosuvastatin Hives    Patient is allergic to generic rosuvastatin. She says she is able to take branded Crestor.   . Simvastatin Other (See Comments)    unknown    PAST MEDICAL HISTORY: Past Medical History:  Diagnosis Date  . Anxiety   . Arthritis    shoulder and knees  . CAD (coronary artery disease)   . Chest pain   . DM (diabetes mellitus) ([place])   . HTN (hypertension)   . Hyperlipemia   . Myocardial infarction   . Sleep apnea     MEDICATIONS AT HOME: Prior to Admission medications   Medication Sig Start Date End Date Taking? Authorizing Provider  acetaminophen-codeine (TYLENOL #3) 300-30 MG tablet Take 1 tablet by mouth every 4 (four) hours as needed for moderate pain. 01/24/16   Tresa Garter, MD  amLODipine (NORVASC) 10 MG tablet Take 1 tablet (10 mg total) by mouth daily. 11/12/16   Tresa Garter, MD  aspirin 81 MG tablet Take 1 tablet (81 mg total) by mouth daily. 09/21/14    Tresa Garter, MD  carvedilol (COREG) 12.5 MG tablet Take 1 tablet (12.5 mg total) by mouth 2 (two) times daily with a meal. 11/12/16   Tresa Garter, MD  clotrimazole (GYNE-LOTRIMIN 3) 2 % vaginal cream Place 1 Applicatorful vaginally at bedtime. 11/12/16    Essie Christine, MD  CRESTOR 20 MG tablet TAKE 1 TABLET BY MOUTH DAILY 12/01/16   Tresa Garter, MD  famotidine (PEPCID) 20 MG tablet Take 1 tablet (20 mg total) by mouth 2 (two) times daily. 09/12/16   Tiffany Carlota Raspberry, PA-C  glipiZIDE (GLUCOTROL) 10 MG tablet Take 1 tablet (10 mg total) by mouth 2 (two) times daily before a meal. 01/28/17   Tresa Garter, MD  glucose blood test strip Use as instructed 11/17/16   Tresa Garter, MD  hydrochlorothiazide (HYDRODIURIL) 25 MG tablet Take 1 tablet (25 mg total) by mouth daily. 11/12/16   Tresa Garter, MD  hydrOXYzine (ATARAX/VISTARIL) 25 MG tablet Take 1 tablet (25 mg total) by mouth every 6 (six) hours. 09/12/16   Tiffany Carlota Raspberry, PA-C  metFORMIN (GLUCOPHAGE) 1000 MG tablet Take 1 tablet (1,000 mg total) by mouth 2 (two) times daily with a meal. 11/12/16   Tresa Garter, MD  Multiple Vitamin (MULTIVITAMIN WITH MINERALS) TABS tablet Take 1 tablet by mouth daily.    Historical Provider, MD  nitroGLYCERIN (NITROSTAT) 0.4 MG SL tablet Place 1 tablet (  0.4 mg total) under the tongue every 5 (five) minutes as needed. Patient taking differently: Place 0.4 mg under the tongue every 5 (five) minutes as needed for chest pain.  10/02/15   Tresa Garter, MD  nystatin cream (MYCOSTATIN) Apply 1 application topically 2 (two) times daily. Patient taking differently: Apply 1 application topically 2 (two) times daily as needed for dry skin.  01/24/16   Tresa Garter, MD  ONETOUCH DELICA LANCETS 25K MISC 1 each by Does not apply route 3 (three) times daily. 11/17/16   Tresa Garter, MD  prasugrel (EFFIENT) 10 MG TABS tablet Take 1 tablet (10 mg total) by mouth daily.  11/12/16   Tresa Garter, MD    Objective:   Vitals:   01/28/17 1507  BP: 121/61  Pulse: 78  Resp: 18  Temp: 98.1 F (36.7 C)  TempSrc: Oral  SpO2: 96%  Height: 5' 7" (1.702 m)   Exam General appearance : Awake, alert, not in any distress. Speech Clear. Not toxic looking HEENT: Atraumatic and Normocephalic, pupils equally reactive to light and accomodation Neck: Supple, no JVD. No cervical lymphadenopathy.  Chest: Good air entry bilaterally, no added sounds  CVS: S1 S2 regular, no murmurs.  Abdomen: Bowel sounds present, Non tender and not distended with no gaurding, rigidity or rebound. Extremities: B/L Lower Ext shows no edema, both legs are warm to touch Neurology: Awake alert, and oriented X 3, CN II-XII intact, Non focal Skin: No Rash  Data Review Lab Results  Component Value Date   HGBA1C 8.7 11/12/2016   HGBA1C 10.0 07/17/2016   HGBA1C 8.40 11/01/2015    Assessment & Plan   1. Type 2 diabetes mellitus without complication, without long-term current use of insulin (HCC)  - POCT A1C - Microalbumin/Creatinine Ratio, Urine - Glucose (CBG) - glipiZIDE (GLUCOTROL) 10 MG tablet; Take 1 tablet (10 mg total) by mouth 2 (two) times daily before a meal.  Dispense: 60 tablet; Refill: 3 - CMP14+EGFR - Lipid panel  2. Essential hypertension  We have discussed target BP range and blood pressure goal. I have advised patient to check BP regularly and to call us back or report to clinic if the numbers are consistently higher than 140/90. We discussed the importance of compliance with medical therapy and DASH diet recommended, consequences of uncontrolled hypertension discussed.  - continue current BP medications  3. Dyslipidemia  To address this please limit saturated fat to no more than 7% of your calories, limit cholesterol to 200 mg/day, increase fiber and exercise as tolerated. If needed we may add another cholesterol lowering medication to your regimen.   4.  Dysuria. UA + for UTI  Keflex prescribed for 5 days  Patient have been counseled extensively about nutrition and exercise. Other issues discussed during this visit include: low cholesterol diet, weight control and daily exercise, foot care, annual eye examinations at Ophthalmology, importance of adherence with medications and regular follow-up. We also discussed long term complications of uncontrolled diabetes and hypertension.   Return in about 4 weeks (around 02/25/2017) for Follow up HTN, Diabetes.  The patient was given clear instructions to go to ER or return to medical center if symptoms don't improve, worsen or new problems develop. The patient verbalized understanding. The patient was told to call to get lab results if they haven't heard anything in the next week.   This note has been created with Surveyor, quantity. Any transcriptional errors are unintentional.  Angelica Chessman, MD, [place], Karilyn Cota, [place] and [place], [place]   01/28/2017, 4:04 PM

## 2017-01-28 NOTE — Progress Notes (Signed)
Patient is here for UTI symptoms and itching.

## 2017-01-29 LAB — LIPID PANEL
CHOL/HDL RATIO: 2.8 ratio (ref 0.0–4.4)
Cholesterol, Total: 102 mg/dL (ref 100–199)
HDL: 37 mg/dL — AB (ref >39)
LDL CALC: 17 mg/dL (ref 0–99)
Triglycerides: 239 mg/dL — ABNORMAL HIGH (ref 0–149)
VLDL Cholesterol Cal: 48 mg/dL — ABNORMAL HIGH (ref 5–40)

## 2017-01-29 LAB — CMP14+EGFR
A/G RATIO: 1.5 (ref 1.2–2.2)
ALK PHOS: 76 IU/L (ref 39–117)
ALT: 10 IU/L (ref 0–32)
AST: 11 IU/L (ref 0–40)
Albumin: 3.9 g/dL (ref 3.6–4.8)
BUN/Creatinine Ratio: 14 (ref 12–28)
BUN: 16 mg/dL (ref 8–27)
Bilirubin Total: 0.5 mg/dL (ref 0.0–1.2)
CALCIUM: 9.5 mg/dL (ref 8.7–10.3)
CO2: 27 mmol/L (ref 18–29)
CREATININE: 1.18 mg/dL — AB (ref 0.57–1.00)
Chloride: 105 mmol/L (ref 96–106)
GFR, EST AFRICAN AMERICAN: 56 mL/min/{1.73_m2} — AB (ref >59)
GFR, EST NON AFRICAN AMERICAN: 48 mL/min/{1.73_m2} — AB (ref >59)
GLOBULIN, TOTAL: 2.6 g/dL (ref 1.5–4.5)
Glucose: 261 mg/dL — ABNORMAL HIGH (ref 65–99)
POTASSIUM: 4.4 mmol/L (ref 3.5–5.2)
SODIUM: 143 mmol/L (ref 134–144)
Total Protein: 6.5 g/dL (ref 6.0–8.5)

## 2017-01-29 LAB — MICROALBUMIN / CREATININE URINE RATIO
CREATININE, UR: 100.9 mg/dL
MICROALB/CREAT RATIO: 84.6 mg/g{creat} — AB (ref 0.0–30.0)
MICROALBUM., U, RANDOM: 85.4 ug/mL

## 2017-02-06 ENCOUNTER — Telehealth: Payer: Self-pay | Admitting: *Deleted

## 2017-02-06 NOTE — Telephone Encounter (Signed)
-----   Message from Tresa Garter, MD sent at 02/04/2017 11:50 AM EDT ----- Kidney function is reduced but stable over the past year. Other results are largely unremarkable. Continue blood sugar control, adhere to medications, diet and exercise.

## 2017-02-06 NOTE — Telephone Encounter (Signed)
Patient verified DOB Patient is aware of labs being normal except for kidney function being reduced but stable over the past year. Patient advised to adhere to medications and controlling blood sugar. Patient received the number to Breast imaging to schedule for mammogram. Patient is also requesting an eye exam with opthalmology.

## 2017-02-07 ENCOUNTER — Other Ambulatory Visit: Payer: Self-pay | Admitting: Internal Medicine

## 2017-02-07 DIAGNOSIS — Z0100 Encounter for examination of eyes and vision without abnormal findings: Principal | ICD-10-CM

## 2017-02-07 DIAGNOSIS — E119 Type 2 diabetes mellitus without complications: Secondary | ICD-10-CM

## 2017-02-07 NOTE — Telephone Encounter (Signed)
Ordered

## 2017-02-09 ENCOUNTER — Encounter: Payer: Self-pay | Admitting: Internal Medicine

## 2017-02-18 DIAGNOSIS — H40033 Anatomical narrow angle, bilateral: Secondary | ICD-10-CM | POA: Diagnosis not present

## 2017-02-24 DIAGNOSIS — H40033 Anatomical narrow angle, bilateral: Secondary | ICD-10-CM | POA: Diagnosis not present

## 2017-02-26 MED FILL — glipiZIDE 10 MG TABS: 10 | 30 days supply | Qty: 60 | Fill #1

## 2017-02-26 MED FILL — AMLODIPINE BESYLATE 10 MG T: 10 | 30 days supply | Qty: 30 | Fill #3

## 2017-03-26 MED FILL — CARVEDILOL 12.5 MG TABLET: 12.5 | 90 days supply | Qty: 180 | Fill #2

## 2017-03-31 ENCOUNTER — Other Ambulatory Visit: Payer: Self-pay | Admitting: Pharmacist

## 2017-03-31 MED ORDER — ACCU-CHEK SOFT TOUCH LANCETS MISC: 12 refills | Status: DC

## 2017-03-31 MED ORDER — GLUCOSE BLOOD VI STRP: ORAL_STRIP | 12 refills | Status: DC

## 2017-03-31 MED ORDER — ACCU-CHEK AVIVA PLUS W/DEVICE KIT: PACK | 0 refills | Status: DC

## 2017-03-31 MED FILL — ACCU-CHEK SOFTCLIX LANCETS: 100 days supply | Qty: 100 | Fill #0

## 2017-03-31 MED FILL — ACCU-CHEK AVIVA PLUS TEST S: 50 days supply | Qty: 50 | Fill #0

## 2017-03-31 MED FILL — ACCU-CHEK AVIVA PLUS W/DEVI: W/DEVICE | 1 days supply | Qty: 1 | Fill #0

## 2017-04-02 ENCOUNTER — Encounter: Payer: Self-pay | Admitting: Pharmacist

## 2017-04-02 MED FILL — LISINOPRIL-HCTZ 20-25 MG TA: 20-25 | 90 days supply | Qty: 90 | Fill #2

## 2017-04-02 MED FILL — CRESTOR 20 MG TABLET: 20 | 30 days supply | Qty: 30 | Fill #1 | Status: TO

## 2017-04-02 MED FILL — glipiZIDE 10 MG TABS: 10 | 30 days supply | Qty: 60 | Fill #2

## 2017-04-02 MED FILL — AMLODIPINE BESYLATE 10 MG T: 10 | 30 days supply | Qty: 30 | Fill #4

## 2017-04-02 NOTE — Progress Notes (Signed)
Prior authorization completed for Crestor through [facility] (ID [id number]).  Pending approval

## 2017-04-09 ENCOUNTER — Other Ambulatory Visit: Payer: Self-pay | Admitting: Pharmacist

## 2017-04-09 MED ORDER — CLOPIDOGREL BISULFATE 75 MG PO TABS: 75.0000 mg | ORAL_TABLET | Freq: Every day | ORAL | 0 refills | Status: DC

## 2017-04-09 MED FILL — CLOPIDOGREL 75 MG TABLET: 75 | 90 days supply | Qty: 90 | Fill #0

## 2017-04-09 NOTE — Telephone Encounter (Signed)
Insurance doesn't cover Effient without failing clopidogrel. Patient on clopidogrel in the past but stopped due to cost. Discussed with Dr. Doreene Burke. She has insurance and the preferred is clopidogrel so will order clopidogrel to replace Effient.

## 2017-04-15 ENCOUNTER — Ambulatory Visit: Payer: Medicare HMO | Attending: Internal Medicine | Admitting: Internal Medicine

## 2017-04-15 ENCOUNTER — Ambulatory Visit: Payer: Medicare Other | Admitting: Internal Medicine

## 2017-04-15 ENCOUNTER — Encounter: Payer: Self-pay | Admitting: Internal Medicine

## 2017-04-15 VITALS — BP 107/62 | HR 70 | Temp 98.4°F | Resp 18 | Ht 66.0 in | Wt 168.0 lb

## 2017-04-15 DIAGNOSIS — G4730 Sleep apnea, unspecified: Secondary | ICD-10-CM | POA: Diagnosis not present

## 2017-04-15 DIAGNOSIS — Z1239 Encounter for other screening for malignant neoplasm of breast: Secondary | ICD-10-CM

## 2017-04-15 DIAGNOSIS — Z7982 Long term (current) use of aspirin: Secondary | ICD-10-CM | POA: Insufficient documentation

## 2017-04-15 DIAGNOSIS — M19019 Primary osteoarthritis, unspecified shoulder: Secondary | ICD-10-CM | POA: Diagnosis not present

## 2017-04-15 DIAGNOSIS — Z7902 Long term (current) use of antithrombotics/antiplatelets: Secondary | ICD-10-CM | POA: Insufficient documentation

## 2017-04-15 DIAGNOSIS — Z7984 Long term (current) use of oral hypoglycemic drugs: Secondary | ICD-10-CM | POA: Diagnosis not present

## 2017-04-15 DIAGNOSIS — M170 Bilateral primary osteoarthritis of knee: Secondary | ICD-10-CM | POA: Insufficient documentation

## 2017-04-15 DIAGNOSIS — E785 Hyperlipidemia, unspecified: Secondary | ICD-10-CM | POA: Insufficient documentation

## 2017-04-15 DIAGNOSIS — I2510 Atherosclerotic heart disease of native coronary artery without angina pectoris: Secondary | ICD-10-CM | POA: Diagnosis not present

## 2017-04-15 DIAGNOSIS — M545 Low back pain: Secondary | ICD-10-CM

## 2017-04-15 DIAGNOSIS — I252 Old myocardial infarction: Secondary | ICD-10-CM | POA: Insufficient documentation

## 2017-04-15 DIAGNOSIS — G8929 Other chronic pain: Secondary | ICD-10-CM

## 2017-04-15 DIAGNOSIS — I10 Essential (primary) hypertension: Secondary | ICD-10-CM | POA: Insufficient documentation

## 2017-04-15 DIAGNOSIS — E119 Type 2 diabetes mellitus without complications: Secondary | ICD-10-CM | POA: Insufficient documentation

## 2017-04-15 DIAGNOSIS — Z888 Allergy status to other drugs, medicaments and biological substances status: Secondary | ICD-10-CM | POA: Insufficient documentation

## 2017-04-15 DIAGNOSIS — Z1231 Encounter for screening mammogram for malignant neoplasm of breast: Secondary | ICD-10-CM

## 2017-04-15 DIAGNOSIS — Z720 Tobacco use: Secondary | ICD-10-CM

## 2017-04-15 LAB — POCT GLYCOSYLATED HEMOGLOBIN (HGB A1C): Hemoglobin A1C: 7.1

## 2017-04-15 LAB — GLUCOSE, POCT (MANUAL RESULT ENTRY): POC Glucose: 205 mg/dl — AB (ref 70–99)

## 2017-04-15 MED ORDER — ACETAMINOPHEN-CODEINE #3 300-30 MG PO TABS: 1.0000 | ORAL_TABLET | ORAL | 0 refills | Status: DC | PRN

## 2017-04-15 NOTE — Progress Notes (Signed)
Annebelle Bostic, is a 66 y.o. female  BRA:[id number]  SUP:[id number]  DOB - 1951/02/03  Chief Complaint  Patient presents with  . Medication Refill      Subjective:   Starlena Beil is a 66 y.o. female with history of hypertension, coronary artery disease, hyperlipidemia, diabetes mellitus, generalized anxiety and obstructive sleep apneahere today for a routine follow up visit. Patient claims she is doing really well, blood sugar is controlled as well as blood pressure. She claims adherence with her medication, working on lifestyle modifications but still smokes cigarette, she is hoping to quit soon. She has no complaint today but she needs refill of her pain medication for her ongoing low back pain from osteoarthritis. Patient has No headache, No chest pain, No abdominal pain - No Nausea, No new weakness tingling or numbness, No Cough - SOB. No urinary symptom, no blood in stool. Colonoscopy in 2012 was normal, next due in 2022. She is due for mammogram.  No problems updated.  ALLERGIES: Allergies  Allergen Reactions  . Benadryl [Diphenhydramine Hcl (Sleep)] Other (See Comments)    unknown  . Rosuvastatin Hives    Patient is allergic to generic rosuvastatin. She says she is able to take branded Crestor.   . Simvastatin Other (See Comments)    unknown   PAST MEDICAL HISTORY: Past Medical History:  Diagnosis Date  . Anxiety   . Arthritis    shoulder and knees  . CAD (coronary artery disease)   . Chest pain   . DM (diabetes mellitus) ([place])   . HTN (hypertension)   . Hyperlipemia   . Myocardial infarction ([place])   . Sleep apnea    MEDICATIONS AT HOME: Prior to Admission medications   Medication Sig Start Date End Date Taking? Authorizing Provider  acetaminophen-codeine (TYLENOL #3) 300-30 MG tablet Take 1 tablet by mouth every 4 (four) hours as needed for moderate pain. 04/15/17  Yes Eladio Dentremont E, MD  amLODipine (NORVASC) 10 MG tablet Take 1 tablet (10 mg total) by  mouth daily. 11/12/16  Yes Tresa Garter, MD  aspirin 81 MG tablet Take 1 tablet (81 mg total) by mouth daily. 09/21/14  Yes Tresa Garter, MD  Blood Glucose Monitoring Suppl (ACCU-CHEK AVIVA PLUS) w/Device KIT Use as directed for daily testing of blood sugar. E11.9 03/31/17  Yes Tresa Garter, MD  carvedilol (COREG) 12.5 MG tablet Take 1 tablet (12.5 mg total) by mouth 2 (two) times daily with a meal. 11/12/16  Yes Birdena Kingma E, MD  clopidogrel (PLAVIX) 75 MG tablet Take 1 tablet (75 mg total) by mouth daily. 04/09/17  Yes Tresa Garter, MD  clotrimazole (GYNE-LOTRIMIN 3) 2 % vaginal cream Place 1 Applicatorful vaginally at bedtime. 11/12/16  Yes Zophia Marrone E, MD  CRESTOR 20 MG tablet TAKE 1 TABLET BY MOUTH DAILY 12/01/16  Yes Alsace Dowd E, MD  famotidine (PEPCID) 20 MG tablet Take 1 tablet (20 mg total) by mouth 2 (two) times daily. 09/12/16  Yes Carlota Raspberry, Tiffany, PA-C  glipiZIDE (GLUCOTROL) 10 MG tablet Take 1 tablet (10 mg total) by mouth 2 (two) times daily before a meal. 01/28/17  Yes Dominga Mcduffie E, MD  glucose blood (ACCU-CHEK AVIVA PLUS) test strip Use as instructed for daily testing of blood sugar. E.11.9 03/31/17  Yes Treysen Sudbeck, Marlena Clipper, MD  hydrochlorothiazide (HYDRODIURIL) 25 MG tablet Take 1 tablet (25 mg total) by mouth daily. 11/12/16  Yes Tresa Garter, MD  hydrOXYzine (ATARAX/VISTARIL) 25 MG tablet Take 1  tablet (25 mg total) by mouth every 6 (six) hours. 09/12/16  Yes Carlota Raspberry, Tiffany, PA-C  Lancets (ACCU-CHEK SOFT TOUCH) lancets Use as instructed for daily testing of blood sugar. E11.9 03/31/17  Yes Tresa Garter, MD  metFORMIN (GLUCOPHAGE) 1000 MG tablet Take 1 tablet (1,000 mg total) by mouth 2 (two) times daily with a meal. 11/12/16  Yes Camron Monday E, MD  Multiple Vitamin (MULTIVITAMIN WITH MINERALS) TABS tablet Take 1 tablet by mouth daily.   Yes [provider]  nitroGLYCERIN (NITROSTAT) 0.4 MG SL  tablet Place 1 tablet (0.4 mg total) under the tongue every 5 (five) minutes as needed. Patient taking differently: Place 0.4 mg under the tongue every 5 (five) minutes as needed for chest pain.  10/02/15  Yes Tresa Garter, MD    Objective:   Vitals:   04/15/17 1411  BP: 107/62  Pulse: 70  Resp: 18  Temp: 98.4 F (36.9 C)  TempSrc: Oral  SpO2: 96%  Weight: 168 lb (76.2 kg)  Height: 5' 6"  (1.676 m)   Exam General appearance : Awake, alert, not in any distress. Speech Clear. Not toxic looking HEENT: Atraumatic and Normocephalic, pupils equally reactive to light and accomodation Neck: Supple, no JVD. No cervical lymphadenopathy.  Chest: Good air entry bilaterally, no added sounds  CVS: S1 S2 regular, no murmurs.  Abdomen: Bowel sounds present, Non tender and not distended with no gaurding, rigidity or rebound. Extremities: B/L Lower Ext shows no edema, both legs are warm to touch Neurology: Awake alert, and oriented X 3, CN II-XII intact, Non focal Skin: No Rash  Data Review Lab Results  Component Value Date   HGBA1C 7.1 04/15/2017   HGBA1C 8.9 01/28/2017   HGBA1C 8.7 11/12/2016    Assessment & Plan   1. Type 2 diabetes mellitus without complication, without long-term current use of insulin (HCC)  - POCT A1C - Glucose (CBG) - POCT UA - Microalbumin  2. Essential hypertension We have discussed target BP range and blood pressure goal. I have advised patient to check BP regularly and to call us back or report to clinic if the numbers are consistently higher than 140/90. We discussed the importance of compliance with medical therapy and DASH diet recommended, consequences of uncontrolled hypertension discussed.  - continue current BP medications  3. Dyslipidemia To address this please limit saturated fat to no more than 7% of your calories, limit cholesterol to 200 mg/day, increase fiber and exercise as tolerated. If needed we may add another cholesterol lowering  medication to your regimen.   4. Tobacco use Kailynn was counseled on the dangers of tobacco use, and was advised to quit. Reviewed strategies to maximize success, including removing cigarettes and smoking materials from environment, stress management and support of family/friends.  5. Chronic midline low back pain without sciatica - acetaminophen-codeine (TYLENOL #3) 300-30 MG tablet; Take 1 tablet by mouth every 4 (four) hours as needed for moderate pain.  Dispense: 60 tablet; Refill: 0  6. Breast cancer screening  - MM Digital Screening; Future  Patient have been counseled extensively about nutrition and exercise. Other issues discussed during this visit include: low cholesterol diet, weight control and daily exercise, foot care, annual eye examinations at Ophthalmology, importance of adherence with medications and regular follow-up. We also discussed long term complications of uncontrolled diabetes and hypertension.   Return in about 3 months (around 07/16/2017) for Hemoglobin A1C and Follow up, DM.  The patient was given clear instructions to go to  ER or return to medical center if symptoms don't improve, worsen or new problems develop. The patient verbalized understanding. The patient was told to call to get lab results if they haven't heard anything in the next week.   This note has been created with Surveyor, quantity. Any transcriptional errors are unintentional.    Angelica Chessman, MD, [place], Karilyn Cota, [place] and [place], [place]   04/15/2017, 2:59 PM

## 2017-04-15 NOTE — Patient Instructions (Signed)
Diabetes Mellitus and Food It is important for you to manage your blood sugar (glucose) level. Your blood glucose level can be greatly affected by what you eat. Eating healthier foods in the appropriate amounts throughout the day at about the same time each day will help you control your blood glucose level. It can also help slow or prevent worsening of your diabetes mellitus. Healthy eating may even help you improve the level of your blood pressure and reach or maintain a healthy weight. General recommendations for healthful eating and cooking habits include:  Eating meals and snacks regularly. Avoid going long periods of time without eating to lose weight.  Eating a diet that consists mainly of plant-based foods, such as fruits, vegetables, nuts, legumes, and whole grains.  Using low-heat cooking methods, such as baking, instead of high-heat cooking methods, such as deep frying.  Work with your dietitian to make sure you understand how to use the Nutrition Facts information on food labels. How can food affect me? Carbohydrates Carbohydrates affect your blood glucose level more than any other type of food. Your dietitian will help you determine how many carbohydrates to eat at each meal and teach you how to count carbohydrates. Counting carbohydrates is important to keep your blood glucose at a healthy level, especially if you are using insulin or taking certain medicines for diabetes mellitus. Alcohol Alcohol can cause sudden decreases in blood glucose (hypoglycemia), especially if you use insulin or take certain medicines for diabetes mellitus. Hypoglycemia can be a life-threatening condition. Symptoms of hypoglycemia (sleepiness, dizziness, and disorientation) are similar to symptoms of having too much alcohol. If your health care provider has given you approval to drink alcohol, do so in moderation and use the following guidelines:  Women should not have more than one drink per day, and men  should not have more than two drinks per day. One drink is equal to: ? 12 oz of beer. ? 5 oz of wine. ? 1 oz of hard liquor.  Do not drink on an empty stomach.  Keep yourself hydrated. Have water, diet soda, or unsweetened iced tea.  Regular soda, juice, and other mixers might contain a lot of carbohydrates and should be counted.  What foods are not recommended? As you make food choices, it is important to remember that all foods are not the same. Some foods have fewer nutrients per serving than other foods, even though they might have the same number of calories or carbohydrates. It is difficult to get your body what it needs when you eat foods with fewer nutrients. Examples of foods that you should avoid that are high in calories and carbohydrates but low in nutrients include:  Trans fats (most processed foods list trans fats on the Nutrition Facts label).  Regular soda.  Juice.  Candy.  Sweets, such as cake, pie, doughnuts, and cookies.  Fried foods.  What foods can I eat? Eat nutrient-rich foods, which will nourish your body and keep you healthy. The food you should eat also will depend on several factors, including:  The calories you need.  The medicines you take.  Your weight.  Your blood glucose level.  Your blood pressure level.  Your cholesterol level.  You should eat a variety of foods, including:  Protein. ? Lean cuts of meat. ? Proteins low in saturated fats, such as fish, egg whites, and beans. Avoid processed meats.  Fruits and vegetables. ? Fruits and vegetables that may help control blood glucose levels, such as apples,   mangoes, and yams.  Dairy products. ? Choose fat-free or low-fat dairy products, such as milk, yogurt, and cheese.  Grains, bread, pasta, and rice. ? Choose whole grain products, such as multigrain bread, whole oats, and brown rice. These foods may help control blood pressure.  Fats. ? Foods containing healthful fats, such as  nuts, avocado, olive oil, canola oil, and fish.  Does everyone with diabetes mellitus have the same meal plan? Because every person with diabetes mellitus is different, there is not one meal plan that works for everyone. It is very important that you meet with a dietitian who will help you create a meal plan that is just right for you. This information is not intended to replace advice given to you by your health care provider. Make sure you discuss any questions you have with your health care provider. Document Released: 07/03/2005 Document Revised: 03/13/2016 Document Reviewed: 09/02/2013 Elsevier Interactive Patient Education  2017 Elsevier Inc. Diabetes Mellitus and Exercise Exercising regularly is important for your overall health, especially when you have diabetes (diabetes mellitus). Exercising is not only about losing weight. It has many health benefits, such as increasing muscle strength and bone density and reducing body fat and stress. This leads to improved fitness, flexibility, and endurance, all of which result in better overall health. Exercise has additional benefits for people with diabetes, including:  Reducing appetite.  Helping to lower and control blood glucose.  Lowering blood pressure.  Helping to control amounts of fatty substances (lipids) in the blood, such as cholesterol and triglycerides.  Helping the body to respond better to insulin (improving insulin sensitivity).  Reducing how much insulin the body needs.  Decreasing the risk for heart disease by: ? Lowering cholesterol and triglyceride levels. ? Increasing the levels of good cholesterol. ? Lowering blood glucose levels.  What is my activity plan? Your health care provider or certified diabetes educator can help you make a plan for the type and frequency of exercise (activity plan) that works for you. Make sure that you:  Do at least 150 minutes of moderate-intensity or vigorous-intensity exercise each  week. This could be brisk walking, biking, or water aerobics. ? Do stretching and strength exercises, such as yoga or weightlifting, at least 2 times a week. ? Spread out your activity over at least 3 days of the week.  Get some form of physical activity every day. ? Do not go more than 2 days in a row without some kind of physical activity. ? Avoid being inactive for more than 90 minutes at a time. Take frequent breaks to walk or stretch.  Choose a type of exercise or activity that you enjoy, and set realistic goals.  Start slowly, and gradually increase the intensity of your exercise over time.  What do I need to know about managing my diabetes?  Check your blood glucose before and after exercising. ? If your blood glucose is higher than 240 mg/dL (13.3 mmol/L) before you exercise, check your urine for ketones. If you have ketones in your urine, do not exercise until your blood glucose returns to normal.  Know the symptoms of low blood glucose (hypoglycemia) and how to treat it. Your risk for hypoglycemia increases during and after exercise. Common symptoms of hypoglycemia can include: ? Hunger. ? Anxiety. ? Sweating and feeling clammy. ? Confusion. ? Dizziness or feeling light-headed. ? Increased heart rate or palpitations. ? Blurry vision. ? Tingling or numbness around the mouth, lips, or tongue. ? Tremors or shakes. ?   Irritability.  Keep a rapid-acting carbohydrate snack available before, during, and after exercise to help prevent or treat hypoglycemia.  Avoid injecting insulin into areas of the body that are going to be exercised. For example, avoid injecting insulin into: ? The arms, when playing tennis. ? The legs, when jogging.  Keep records of your exercise habits. Doing this can help you and your health care provider adjust your diabetes management plan as needed. Write down: ? Food that you eat before and after you exercise. ? Blood glucose levels before and after you  exercise. ? The type and amount of exercise you have done. ? When your insulin is expected to peak, if you use insulin. Avoid exercising at times when your insulin is peaking.  When you start a new exercise or activity, work with your health care provider to make sure the activity is safe for you, and to adjust your insulin, medicines, or food intake as needed.  Drink plenty of water while you exercise to prevent dehydration or heat stroke. Drink enough fluid to keep your urine clear or pale yellow. This information is not intended to replace advice given to you by your health care provider. Make sure you discuss any questions you have with your health care provider. Document Released: 12/27/2003 Document Revised: 04/25/2016 Document Reviewed: 03/17/2016 Elsevier Interactive Patient Education  2018 [employer] Inc. Blood Glucose Monitoring, Adult Monitoring your blood sugar (glucose) helps you manage your diabetes. It also helps you and your health care provider determine how well your diabetes management plan is working. Blood glucose monitoring involves checking your blood glucose as often as directed, and keeping a record (log) of your results over time. Why should I monitor my blood glucose? Checking your blood glucose regularly can:  Help you understand how food, exercise, illnesses, and medicines affect your blood glucose.  Let you know what your blood glucose is at any time. You can quickly tell if you are having low blood glucose (hypoglycemia) or high blood glucose (hyperglycemia).  Help you and your health care provider adjust your medicines as needed.  When should I check my blood glucose? Follow instructions from your health care provider about how often to check your blood glucose. This may depend on:  The type of diabetes you have.  How well-controlled your diabetes is.  Medicines you are taking.  If you have type 1 diabetes:  Check your blood glucose at least 2 times a  day.  Also check your blood glucose: ? Before every insulin injection. ? Before and after exercise. ? Between meals. ? 2 hours after a meal. ? Occasionally between 2:00 a.m. and 3:00 a.m., as directed. ? Before potentially dangerous tasks, like driving or using heavy machinery. ? At bedtime.  You may need to check your blood glucose more often, up to 6-10 times a day: ? If you use an insulin pump. ? If you need multiple daily injections (MDI). ? If your diabetes is not well-controlled. ? If you are ill. ? If you have a history of severe hypoglycemia. ? If you have a history of not knowing when your blood glucose is getting low (hypoglycemia unawareness). If you have type 2 diabetes:  If you take insulin or other diabetes medicines, check your blood glucose at least 2 times a day.  If you are on intensive insulin therapy, check your blood glucose at least 4 times a day. Occasionally, you may also need to check between 2:00 a.m. and 3:00 a.m., as directed.    Also check your blood glucose: ? Before and after exercise. ? Before potentially dangerous tasks, like driving or using heavy machinery.  You may need to check your blood glucose more often if: ? Your medicine is being adjusted. ? Your diabetes is not well-controlled. ? You are ill. What is a blood glucose log?  A blood glucose log is a record of your blood glucose readings. It helps you and your health care provider: ? Look for patterns in your blood glucose over time. ? Adjust your diabetes management plan as needed.  Every time you check your blood glucose, write down your result and notes about things that may be affecting your blood glucose, such as your diet and exercise for the day.  Most glucose meters store a record of glucose readings in the meter. Some meters allow you to download your records to a computer. How do I check my blood glucose? Follow these steps to get accurate readings of your blood  glucose: Supplies needed   Blood glucose meter.  Test strips for your meter. Each meter has its own strips. You must use the strips that come with your meter.  A needle to prick your finger (lancet). Do not use lancets more than once.  A device that holds the lancet (lancing device).  A journal or log book to write down your results. Procedure  Wash your hands with soap and water.  Prick the side of your finger (not the tip) with the lancet. Use a different finger each time.  Gently rub the finger until a small drop of blood appears.  Follow instructions that come with your meter for inserting the test strip, applying blood to the strip, and using your blood glucose meter.  Write down your result and any notes. Alternative testing sites  Some meters allow you to use areas of your body other than your finger (alternative sites) to test your blood.  If you think you may have hypoglycemia, or if you have hypoglycemia unawareness, do not use alternative sites. Use your finger instead.  Alternative sites may not be as accurate as the fingers, because blood flow is slower in these areas. This means that the result you get may be delayed, and it may be different from the result that you would get from your finger.  The most common alternative sites are: ? Forearm. ? Thigh. ? Palm of the hand. Additional tips  Always keep your supplies with you.  If you have questions or need help, all blood glucose meters have a 24-hour "hotline" number that you can call. You may also contact your health care provider.  After you use a few boxes of test strips, adjust (calibrate) your blood glucose meter by following instructions that came with your meter. This information is not intended to replace advice given to you by your health care provider. Make sure you discuss any questions you have with your health care provider. Document Released: 10/09/2003 Document Revised: 04/25/2016 Document  Reviewed: 03/17/2016 Elsevier Interactive Patient Education  2017 Elsevier Inc.  

## 2017-05-01 MED FILL — glipiZIDE 10 MG TABS: 10 | 30 days supply | Qty: 60 | Fill #3

## 2017-05-01 MED FILL — CRESTOR 20 MG TABLET: 20 | 30 days supply | Qty: 30 | Fill #2 | Status: TO

## 2017-05-18 MED FILL — ACCU-CHEK AVIVA PLUS TEST S: 50 days supply | Qty: 50 | Fill #1

## 2017-05-21 ENCOUNTER — Ambulatory Visit
Admission: RE | Admit: 2017-05-21 | Discharge: 2017-05-21 | Disposition: A | Discharge status: Home / Self Care | Payer: Medicare HMO | Source: Ambulatory Visit | Attending: Internal Medicine | Admitting: Internal Medicine

## 2017-05-21 DIAGNOSIS — Z1239 Encounter for other screening for malignant neoplasm of breast: Secondary | ICD-10-CM

## 2017-05-21 DIAGNOSIS — Z1231 Encounter for screening mammogram for malignant neoplasm of breast: Secondary | ICD-10-CM | POA: Diagnosis not present

## 2017-05-21 MED FILL — AMLODIPINE BESYLATE 10 MG T: 10 | 30 days supply | Qty: 30 | Fill #5

## 2017-05-29 ENCOUNTER — Telehealth: Payer: Self-pay

## 2017-05-29 NOTE — Telephone Encounter (Signed)
Contacted pt to go over mm results pt is aware of results and doesn't have any questions or conerns

## 2017-06-09 ENCOUNTER — Other Ambulatory Visit: Payer: Self-pay | Admitting: Internal Medicine

## 2017-06-09 DIAGNOSIS — E119 Type 2 diabetes mellitus without complications: Secondary | ICD-10-CM

## 2017-06-09 MED FILL — CRESTOR 20 MG TABLET: 20 | 30 days supply | Qty: 30 | Fill #3 | Status: TO

## 2017-06-09 MED FILL — glipiZIDE 10 MG TABS: 10 | 30 days supply | Qty: 60 | Fill #0

## 2017-06-12 ENCOUNTER — Telehealth: Payer: Self-pay | Admitting: Internal Medicine

## 2017-06-12 DIAGNOSIS — G8929 Other chronic pain: Secondary | ICD-10-CM

## 2017-06-12 DIAGNOSIS — M545 Low back pain: Principal | ICD-10-CM

## 2017-06-12 NOTE — Telephone Encounter (Signed)
Please advise on this. I am unsure how she received a script un sign from June and is just now attempting to fill it.

## 2017-06-12 NOTE — Telephone Encounter (Signed)
[facility] called , since the just got a prescription for Tylenol # 3 don on 04/15/17 and is not signed by the provider, they need auth to process the prescription, please call them back at [phone]

## 2017-06-12 NOTE — Telephone Encounter (Signed)
She needs to come pick up a new prescription

## 2017-06-17 MED ORDER — ACETAMINOPHEN-CODEINE #3 300-30 MG PO TABS: 1.0000 | ORAL_TABLET | ORAL | 0 refills | Status: DC | PRN

## 2017-06-17 NOTE — Telephone Encounter (Signed)
Pt will come in and pick up Rx tomorrow. Please leave at front desk.

## 2017-06-18 MED FILL — AMLODIPINE BESYLATE 10 MG T: 10 | 30 days supply | Qty: 30 | Fill #6

## 2017-07-02 MED FILL — ACCU-CHEK AVIVA PLUS TEST S: 50 days supply | Qty: 50 | Fill #2

## 2017-07-14 ENCOUNTER — Other Ambulatory Visit: Payer: Self-pay | Admitting: Internal Medicine

## 2017-07-14 MED FILL — glipiZIDE 10 MG TABS: 10 | 30 days supply | Qty: 60 | Fill #1

## 2017-07-14 MED FILL — LISINOPRIL-HCTZ 20-25 MG TA: 20-25 | 90 days supply | Qty: 90 | Fill #3

## 2017-07-14 MED FILL — AMLODIPINE BESYLATE 10 MG T: 10 | 30 days supply | Qty: 30 | Fill #7

## 2017-07-14 MED FILL — CRESTOR 20 MG TABLET: 20 | 90 days supply | Qty: 90 | Fill #4 | Status: TO

## 2017-07-14 MED FILL — CLOPIDOGREL 75 MG TABLET: 75 | 30 days supply | Qty: 30 | Fill #0

## 2017-08-21 MED FILL — CARVEDILOL 12.5 MG TABLET: 12.5 | 30 days supply | Qty: 60 | Fill #1

## 2017-08-21 MED FILL — AMLODIPINE BESYLATE 10 MG T: 10 | 30 days supply | Qty: 30 | Fill #8

## 2017-08-21 MED FILL — CLOPIDOGREL 75 MG TABLET: 75 | 30 days supply | Qty: 30 | Fill #1

## 2017-08-21 MED FILL — glipiZIDE 10 MG TABS: 10 | 30 days supply | Qty: 60 | Fill #2

## 2017-08-28 ENCOUNTER — Ambulatory Visit (HOSPITAL_COMMUNITY)
Admission: EM | Admit: 2017-08-28 | Discharge: 2017-08-28 | Disposition: A | Discharge status: Home / Self Care | Payer: Medicare HMO | Attending: Internal Medicine | Admitting: Internal Medicine

## 2017-08-28 ENCOUNTER — Other Ambulatory Visit: Payer: Self-pay

## 2017-08-28 ENCOUNTER — Encounter (HOSPITAL_COMMUNITY): Payer: Self-pay | Admitting: Emergency Medicine

## 2017-08-28 ENCOUNTER — Telehealth: Payer: Self-pay | Admitting: Internal Medicine

## 2017-08-28 DIAGNOSIS — J321 Chronic frontal sinusitis: Secondary | ICD-10-CM

## 2017-08-28 DIAGNOSIS — J0110 Acute frontal sinusitis, unspecified: Secondary | ICD-10-CM

## 2017-08-28 MED ORDER — DOXYCYCLINE HYCLATE 100 MG PO CAPS: 100.0000 mg | ORAL_CAPSULE | Freq: Two times a day (BID) | ORAL | 0 refills | Status: DC

## 2017-08-28 NOTE — ED Triage Notes (Signed)
Pt c/o sinus congestion. Pt given doxycycline 100mg  tablems and finished them, pt also has chlorphenirmine which said doesn't work. Pt requesting more doxycycline because it made her feel better.

## 2017-08-28 NOTE — Telephone Encounter (Signed)
Patient came into office requesting medication refill on acetaminophen-codeine (TYLENOL #3) 300-30 MG tablet, please f/up

## 2017-08-28 NOTE — ED Provider Notes (Signed)
[place]    CSN: [id number] Arrival date & time: 08/28/17  1157     History   Chief Complaint Chief Complaint  Patient presents with  . Nasal Congestion  . Medication Refill    HPI DEARRA MYHAND is a 66 y.o. female.   Reports to have nasal drainage, nasal congestion, frontal sinus pressure, and headache for 1 week with no improvement from OTC medication. Patient states her symptoms are getting worse.  Patient denies sore throat, sneezing, coughing, fever, CP, SOB or Abd pain. Patient have hx of sinus infection that normally goes away with Doxycycline (last taken last year).   HPI  Past Medical History:  Diagnosis Date  . Anxiety   . Arthritis    shoulder and knees  . CAD (coronary artery disease)   . Chest pain   . DM (diabetes mellitus) ([place])   . HTN (hypertension)   . Hyperlipemia   . Myocardial infarction ([place])   . Sleep apnea     Patient Active Problem List   Diagnosis Date Noted  . Dysuria 01/28/2017  . Vaginal itching 01/24/2016  . Midline low back pain without sciatica 01/24/2016  . Type 2 diabetes mellitus without complication, without long-term current use of insulin ([place]) 12/06/2015  . Pap smear for cervical cancer screening 12/06/2015  . Dyslipidemia 11/29/2015  . Cough 11/01/2015  . Hip pain 02/08/2015  . Essential hypertension 03/22/2014  . Mixed hyperlipidemia 03/22/2014  . Tobacco use 03/22/2014  . Type 2 diabetes mellitus ([place]) 03/22/2014  . Anxiety 03/22/2014  . CAD (coronary artery disease) 05/06/2011    Past Surgical History:  Procedure Laterality Date  . CARDIAC CATHETERIZATION    . COLONOSCOPY    . CORONARY STENT PLACEMENT    . Left knee surgery      OB History    No data available       Home Medications    Prior to Admission medications   Medication Sig Start Date End Date Taking? Authorizing Provider  acetaminophen-codeine (TYLENOL #3) 300-30 MG tablet Take 1 tablet by mouth every 4 (four) hours as needed  for moderate pain. 06/17/17  Yes Tresa Garter, MD  amLODipine (NORVASC) 10 MG tablet Take 1 tablet (10 mg total) by mouth daily. 11/12/16  Yes Tresa Garter, MD  aspirin 81 MG tablet Take 1 tablet (81 mg total) by mouth daily. 09/21/14  Yes Tresa Garter, MD  Blood Glucose Monitoring Suppl (ACCU-CHEK AVIVA PLUS) w/Device KIT Use as directed for daily testing of blood sugar. E11.9 03/31/17  Yes Tresa Garter, MD  carvedilol (COREG) 12.5 MG tablet Take 1 tablet (12.5 mg total) by mouth 2 (two) times daily with a meal. 11/12/16  Yes Jegede, Olugbemiga E, MD  clopidogrel (PLAVIX) 75 MG tablet TAKE 1 TABLET (75 MG TOTAL) BY MOUTH DAILY. 07/14/17  Yes Jegede, Olugbemiga E, MD  CRESTOR 20 MG tablet TAKE 1 TABLET BY MOUTH DAILY 12/01/16  Yes Jegede, Olugbemiga E, MD  famotidine (PEPCID) 20 MG tablet Take 1 tablet (20 mg total) by mouth 2 (two) times daily. 09/12/16  Yes Carlota Raspberry, Tiffany, PA-C  glipiZIDE (GLUCOTROL) 10 MG tablet TAKE 1 TABLET BY MOUTH 2 TIMES DAILY BEFORE A MEAL. 06/09/17  Yes Jegede, Olugbemiga E, MD  glucose blood (ACCU-CHEK AVIVA PLUS) test strip Use as instructed for daily testing of blood sugar. E.11.9 03/31/17  Yes Jegede, Marlena Clipper, MD  hydrochlorothiazide (HYDRODIURIL) 25 MG tablet Take 1 tablet (25 mg total) by mouth daily.  11/12/16  Yes Tresa Garter, MD  Lancets (ACCU-CHEK SOFT TOUCH) lancets Use as instructed for daily testing of blood sugar. E11.9 03/31/17  Yes Tresa Garter, MD  metFORMIN (GLUCOPHAGE) 1000 MG tablet Take 1 tablet (1,000 mg total) by mouth 2 (two) times daily with a meal. 11/12/16  Yes Jegede, Olugbemiga E, MD  nitroGLYCERIN (NITROSTAT) 0.4 MG SL tablet Place 1 tablet (0.4 mg total) under the tongue every 5 (five) minutes as needed. Patient taking differently: Place 0.4 mg under the tongue every 5 (five) minutes as needed for chest pain.  10/02/15  Yes Tresa Garter, MD  clotrimazole (GYNE-LOTRIMIN 3) 2 % vaginal cream Place  1 Applicatorful vaginally at bedtime. 11/12/16   Tresa Garter, MD  doxycycline (VIBRAMYCIN) 100 MG capsule Take 1 capsule (100 mg total) 2 (two) times daily for 7 days by mouth. 08/28/17 09/04/17  Barry Dienes, NP  hydrOXYzine (ATARAX/VISTARIL) 25 MG tablet Take 1 tablet (25 mg total) by mouth every 6 (six) hours. 09/12/16   Delos Haring, PA-C  Multiple Vitamin (MULTIVITAMIN WITH MINERALS) TABS tablet Take 1 tablet by mouth daily.    [provider]    Family History Family History  Problem Relation Age of Onset  . Anesthesia problems Neg Hx   . Hypotension Neg Hx   . Malignant hyperthermia Neg Hx   . Pseudochol deficiency Neg Hx     Social History Social History   Tobacco Use  . Smoking status: Current Every Day Smoker    Packs/day: 0.25    Years: 15.00    Pack years: 3.75    Types: Cigarettes  . Smokeless tobacco: Never Used  . Tobacco comment: 4-5 daily  Substance Use Topics  . Alcohol use: Yes    Alcohol/week: 0.6 oz    Types: 1 Shots of liquor per week    Comment: 3 weeks ago  . Drug use: No     Allergies   Benadryl [diphenhydramine hcl (sleep)]; Rosuvastatin; and Simvastatin   Review of Systems Review of Systems  Constitutional:       See HPI     Physical Exam Triage Vital Signs ED Triage Vitals [08/28/17 1238]  Enc Vitals Group     BP 106/66     Pulse Rate 76     Resp 18     Temp 98.1 F (36.7 C)     Temp src      SpO2 100 %     Weight      Height      Head Circumference      Peak Flow      Pain Score      Pain Loc      Pain Edu?      Excl. in [place]?    No data found.  Updated Vital Signs BP 106/66   Pulse 76   Temp 98.1 F (36.7 C)   Resp 18   SpO2 100%   Visual Acuity Right Eye Distance:   Left Eye Distance:   Bilateral Distance:    Right Eye Near:   Left Eye Near:    Bilateral Near:     Physical Exam  Constitutional: She is oriented to person, place, and time. She appears well-developed and well-nourished.  No distress.  HENT:  Head: Normocephalic and atraumatic.  Right Ear: External ear normal.  Left Ear: External ear normal.  Nose: Nose normal.  Mouth/Throat: Oropharynx is clear and moist. No oropharyngeal exudate.  No sinus pain on percuss  but reports a lot of pressure.   Eyes: Conjunctivae are normal. Pupils are equal, round, and reactive to light.  Neck: Normal range of motion. Neck supple.  Cardiovascular: Normal rate, regular rhythm and normal heart sounds.  Pulmonary/Chest: Effort normal and breath sounds normal.  Abdominal: Soft. Bowel sounds are normal. There is no tenderness.  Lymphadenopathy:    She has no cervical adenopathy.  Neurological: She is alert and oriented to person, place, and time.  Skin: Skin is warm and dry. She is not diaphoretic.  Nursing note and vitals reviewed.    UC Treatments / Results  Labs (all labs ordered are listed, but only abnormal results are displayed) Labs Reviewed - No data to display  EKG  EKG Interpretation None       Radiology No results found.  Procedures Procedures (including critical care time)  Medications Ordered in UC Medications - No data to display   Initial Impression / Assessment and Plan / UC Course  I have reviewed the triage vital signs and the nursing notes.  Pertinent labs & imaging results that were available during my care of the patient were reviewed by me and considered in my medical decision making (see chart for details).   Final Clinical Impressions(s) / UC Diagnoses   Final diagnoses:  Acute non-recurrent frontal sinusitis   Prescriptions given (see below). Reviewed directions for usage and side effects. Patient states understanding and will call with questions or problems. Patient instructed to call or follow up with his/her primary care doctor if failure to improve or change in symptoms. Discharge instruction given.   ED Discharge Orders        Ordered    doxycycline (VIBRAMYCIN) 100 MG  capsule  2 times daily     08/28/17 1253     Controlled Substance Prescriptions [place] Controlled Substance Registry consulted? Not Applicable   Barry Dienes, NP 08/28/17 1328

## 2017-09-07 ENCOUNTER — Other Ambulatory Visit: Payer: Self-pay | Admitting: Internal Medicine

## 2017-09-07 DIAGNOSIS — G8929 Other chronic pain: Secondary | ICD-10-CM

## 2017-09-07 DIAGNOSIS — M545 Low back pain: Principal | ICD-10-CM

## 2017-09-07 MED ORDER — ACETAMINOPHEN-CODEINE #3 300-30 MG PO TABS: 1.0000 | ORAL_TABLET | ORAL | 2 refills | Status: DC | PRN

## 2017-09-07 NOTE — Telephone Encounter (Signed)
Refilled electronically with refills

## 2017-09-22 MED FILL — glipiZIDE 10 MG TABS: 10 | 30 days supply | Qty: 60 | Fill #3

## 2017-09-22 MED FILL — CARVEDILOL 12.5 MG TABLET: 12.5 | 30 days supply | Qty: 60 | Fill #2

## 2017-09-22 MED FILL — CLOPIDOGREL 75 MG TABLET: 75 | 30 days supply | Qty: 30 | Fill #2

## 2017-09-22 MED FILL — ACCU-CHEK AVIVA PLUS TEST S: 50 days supply | Qty: 50 | Fill #3

## 2017-09-22 MED FILL — AMLODIPINE BESYLATE 10 MG T: 10 | 30 days supply | Qty: 30 | Fill #9

## 2017-09-23 ENCOUNTER — Ambulatory Visit: Payer: Medicare HMO | Attending: Internal Medicine | Admitting: Internal Medicine

## 2017-09-23 ENCOUNTER — Encounter: Payer: Self-pay | Admitting: Internal Medicine

## 2017-09-23 VITALS — BP 126/70 | HR 90 | Temp 98.6°F | Resp 18 | Ht 67.0 in | Wt 164.0 lb

## 2017-09-23 DIAGNOSIS — Z79899 Other long term (current) drug therapy: Secondary | ICD-10-CM | POA: Diagnosis not present

## 2017-09-23 DIAGNOSIS — Z7982 Long term (current) use of aspirin: Secondary | ICD-10-CM | POA: Insufficient documentation

## 2017-09-23 DIAGNOSIS — I252 Old myocardial infarction: Secondary | ICD-10-CM | POA: Diagnosis not present

## 2017-09-23 DIAGNOSIS — R300 Dysuria: Secondary | ICD-10-CM | POA: Diagnosis not present

## 2017-09-23 DIAGNOSIS — G4733 Obstructive sleep apnea (adult) (pediatric): Secondary | ICD-10-CM | POA: Diagnosis not present

## 2017-09-23 DIAGNOSIS — I2510 Atherosclerotic heart disease of native coronary artery without angina pectoris: Secondary | ICD-10-CM | POA: Insufficient documentation

## 2017-09-23 DIAGNOSIS — Z7984 Long term (current) use of oral hypoglycemic drugs: Secondary | ICD-10-CM | POA: Insufficient documentation

## 2017-09-23 DIAGNOSIS — M1990 Unspecified osteoarthritis, unspecified site: Secondary | ICD-10-CM | POA: Insufficient documentation

## 2017-09-23 DIAGNOSIS — E785 Hyperlipidemia, unspecified: Secondary | ICD-10-CM | POA: Insufficient documentation

## 2017-09-23 DIAGNOSIS — E119 Type 2 diabetes mellitus without complications: Secondary | ICD-10-CM | POA: Diagnosis not present

## 2017-09-23 DIAGNOSIS — Z720 Tobacco use: Secondary | ICD-10-CM

## 2017-09-23 DIAGNOSIS — F17210 Nicotine dependence, cigarettes, uncomplicated: Secondary | ICD-10-CM | POA: Insufficient documentation

## 2017-09-23 DIAGNOSIS — Z888 Allergy status to other drugs, medicaments and biological substances status: Secondary | ICD-10-CM | POA: Diagnosis not present

## 2017-09-23 DIAGNOSIS — I10 Essential (primary) hypertension: Secondary | ICD-10-CM | POA: Insufficient documentation

## 2017-09-23 DIAGNOSIS — Z7902 Long term (current) use of antithrombotics/antiplatelets: Secondary | ICD-10-CM | POA: Diagnosis not present

## 2017-09-23 LAB — POCT GLYCOSYLATED HEMOGLOBIN (HGB A1C): HEMOGLOBIN A1C: 6.6

## 2017-09-23 LAB — GLUCOSE, POCT (MANUAL RESULT ENTRY): POC Glucose: 180 mg/dl — AB (ref 70–99)

## 2017-09-23 MED ORDER — ROSUVASTATIN CALCIUM 20 MG PO TABS: 20.0000 mg | ORAL_TABLET | Freq: Every day | ORAL | 3 refills | Status: DC

## 2017-09-23 MED ORDER — ACCU-CHEK AVIVA PLUS W/DEVICE KIT: PACK | 0 refills | Status: DC

## 2017-09-23 MED ORDER — GLIPIZIDE 10 MG PO TABS: 10.0000 mg | ORAL_TABLET | Freq: Two times a day (BID) | ORAL | 3 refills | Status: DC

## 2017-09-23 MED ORDER — METFORMIN HCL 1000 MG PO TABS: 1000.0000 mg | ORAL_TABLET | Freq: Two times a day (BID) | ORAL | 3 refills | Status: DC

## 2017-09-23 MED ORDER — CARVEDILOL 12.5 MG PO TABS: 12.5000 mg | ORAL_TABLET | Freq: Two times a day (BID) | ORAL | 3 refills | Status: DC

## 2017-09-23 MED ORDER — PRASUGREL HCL 10 MG PO TABS: 10.0000 mg | ORAL_TABLET | Freq: Every day | ORAL | 3 refills | Status: DC

## 2017-09-23 MED ORDER — HYDROCHLOROTHIAZIDE 25 MG PO TABS: 25.0000 mg | ORAL_TABLET | Freq: Every day | ORAL | 3 refills | Status: DC

## 2017-09-23 MED ORDER — ACCU-CHEK SOFT TOUCH LANCETS MISC: 12 refills | Status: DC

## 2017-09-23 MED ORDER — AMLODIPINE BESYLATE 10 MG PO TABS: 10.0000 mg | ORAL_TABLET | Freq: Every day | ORAL | 3 refills | Status: DC

## 2017-09-23 NOTE — Patient Instructions (Signed)
Diabetes and Foot Care Diabetes may cause you to have problems because of poor blood supply (circulation) to your feet and legs. This may cause the skin on your feet to become thinner, break easier, and heal more slowly. Your skin may become dry, and the skin may peel and crack. You may also have nerve damage in your legs and feet causing decreased feeling in them. You may not notice minor injuries to your feet that could lead to infections or more serious problems. Taking care of your feet is one of the most important things you can do for yourself. Follow these instructions at home:  Wear shoes at all times, even in the house. Do not go barefoot. Bare feet are easily injured.  Check your feet daily for blisters, cuts, and redness. If you cannot see the bottom of your feet, use a mirror or ask someone for help.  Wash your feet with warm water (do not use hot water) and mild soap. Then pat your feet and the areas between your toes until they are completely dry. Do not soak your feet as this can dry your skin.  Apply a moisturizing lotion or petroleum jelly (that does not contain alcohol and is unscented) to the skin on your feet and to dry, brittle toenails. Do not apply lotion between your toes.  Trim your toenails straight across. Do not dig under them or around the cuticle. File the edges of your nails with an emery board or nail file.  Do not cut corns or calluses or try to remove them with medicine.  Wear clean socks or stockings every day. Make sure they are not too tight. Do not wear knee-high stockings since they may decrease blood flow to your legs.  Wear shoes that fit properly and have enough cushioning. To break in new shoes, wear them for just a few hours a day. This prevents you from injuring your feet. Always look in your shoes before you put them on to be sure there are no objects inside.  Do not cross your legs. This may decrease the blood flow to your feet.  If you find a  minor scrape, cut, or break in the skin on your feet, keep it and the skin around it clean and dry. These areas may be cleansed with mild soap and water. Do not cleanse the area with peroxide, alcohol, or iodine.  When you remove an adhesive bandage, be sure not to damage the skin around it.  If you have a wound, look at it several times a day to make sure it is healing.  Do not use heating pads or hot water bottles. They may burn your skin. If you have lost feeling in your feet or legs, you may not know it is happening until it is too late.  Make sure your health care provider performs a complete foot exam at least annually or more often if you have foot problems. Report any cuts, sores, or bruises to your health care provider immediately. Contact a health care provider if:  You have an injury that is not healing.  You have cuts or breaks in the skin.  You have an ingrown nail.  You notice redness on your legs or feet.  You feel burning or tingling in your legs or feet.  You have pain or cramps in your legs and feet.  Your legs or feet are numb.  Your feet always feel cold. Get help right away if:  There is increasing   redness, swelling, or pain in or around a wound.  There is a red line that goes up your leg.  Pus is coming from a wound.  You develop a fever or as directed by your health care provider.  You notice a bad smell coming from an ulcer or wound. This information is not intended to replace advice given to you by your health care provider. Make sure you discuss any questions you have with your health care provider. Document Released: 10/03/2000 Document Revised: 03/13/2016 Document Reviewed: 03/15/2013 Elsevier Interactive Patient Education  2017 [place]. Diabetes Mellitus and Food It is important for you to manage your blood sugar (glucose) level. Your blood glucose level can be greatly affected by what you eat. Eating healthier foods in the appropriate  amounts throughout the day at about the same time each day will help you control your blood glucose level. It can also help slow or prevent worsening of your diabetes mellitus. Healthy eating may even help you improve the level of your blood pressure and reach or maintain a healthy weight. General recommendations for healthful eating and cooking habits include:  Eating meals and snacks regularly. Avoid going long periods of time without eating to lose weight.  Eating a diet that consists mainly of plant-based foods, such as fruits, vegetables, nuts, legumes, and whole grains.  Using low-heat cooking methods, such as baking, instead of high-heat cooking methods, such as deep frying.  Work with your dietitian to make sure you understand how to use the Nutrition Facts information on food labels. How can food affect me? Carbohydrates Carbohydrates affect your blood glucose level more than any other type of food. Your dietitian will help you determine how many carbohydrates to eat at each meal and teach you how to count carbohydrates. Counting carbohydrates is important to keep your blood glucose at a healthy level, especially if you are using insulin or taking certain medicines for diabetes mellitus. Alcohol Alcohol can cause sudden decreases in blood glucose (hypoglycemia), especially if you use insulin or take certain medicines for diabetes mellitus. Hypoglycemia can be a life-threatening condition. Symptoms of hypoglycemia (sleepiness, dizziness, and disorientation) are similar to symptoms of having too much alcohol. If your health care provider has given you approval to drink alcohol, do so in moderation and use the following guidelines:  Women should not have more than one drink per day, and men should not have more than two drinks per day. One drink is equal to: ? 12 oz of beer. ? 5 oz of wine. ? 1 oz of hard liquor.  Do not drink on an empty stomach.  Keep yourself hydrated. Have water,  diet soda, or unsweetened iced tea.  Regular soda, juice, and other mixers might contain a lot of carbohydrates and should be counted.  What foods are not recommended? As you make food choices, it is important to remember that all foods are not the same. Some foods have fewer nutrients per serving than other foods, even though they might have the same number of calories or carbohydrates. It is difficult to get your body what it needs when you eat foods with fewer nutrients. Examples of foods that you should avoid that are high in calories and carbohydrates but low in nutrients include:  Trans fats (most processed foods list trans fats on the Nutrition Facts label).  Regular soda.  Juice.  Candy.  Sweets, such as cake, pie, doughnuts, and cookies.  Fried foods.  What foods can I eat? Eat nutrient-rich  foods, which will nourish your body and keep you healthy. The food you should eat also will depend on several factors, including:  The calories you need.  The medicines you take.  Your weight.  Your blood glucose level.  Your blood pressure level.  Your cholesterol level.  You should eat a variety of foods, including:  Protein. ? Lean cuts of meat. ? Proteins low in saturated fats, such as fish, egg whites, and beans. Avoid processed meats.  Fruits and vegetables. ? Fruits and vegetables that may help control blood glucose levels, such as apples, mangoes, and yams.  Dairy products. ? Choose fat-free or low-fat dairy products, such as milk, yogurt, and cheese.  Grains, bread, pasta, and rice. ? Choose whole grain products, such as multigrain bread, whole oats, and brown rice. These foods may help control blood pressure.  Fats. ? Foods containing healthful fats, such as nuts, avocado, olive oil, canola oil, and fish.  Does everyone with diabetes mellitus have the same meal plan? Because every person with diabetes mellitus is different, there is not one meal plan that  works for everyone. It is very important that you meet with a dietitian who will help you create a meal plan that is just right for you. This information is not intended to replace advice given to you by your health care provider. Make sure you discuss any questions you have with your health care provider. Document Released: 07/03/2005 Document Revised: 03/13/2016 Document Reviewed: 09/02/2013 Elsevier Interactive Patient Education  2017 [place]. Diabetes Mellitus and Exercise Exercising regularly is important for your overall health, especially when you have diabetes (diabetes mellitus). Exercising is not only about losing weight. It has many health benefits, such as increasing muscle strength and bone density and reducing body fat and stress. This leads to improved fitness, flexibility, and endurance, all of which result in better overall health. Exercise has additional benefits for people with diabetes, including:  Reducing appetite.  Helping to lower and control blood glucose.  Lowering blood pressure.  Helping to control amounts of fatty substances (lipids) in the blood, such as cholesterol and triglycerides.  Helping the body to respond better to insulin (improving insulin sensitivity).  Reducing how much insulin the body needs.  Decreasing the risk for heart disease by: ? Lowering cholesterol and triglyceride levels. ? Increasing the levels of good cholesterol. ? Lowering blood glucose levels.  What is my activity plan? Your health care provider or certified diabetes educator can help you make a plan for the type and frequency of exercise (activity plan) that works for you. Make sure that you:  Do at least 150 minutes of moderate-intensity or vigorous-intensity exercise each week. This could be brisk walking, biking, or water aerobics. ? Do stretching and strength exercises, such as yoga or weightlifting, at least 2 times a week. ? Spread out your activity over at least 3  days of the week.  Get some form of physical activity every day. ? Do not go more than 2 days in a row without some kind of physical activity. ? Avoid being inactive for more than 90 minutes at a time. Take frequent breaks to walk or stretch.  Choose a type of exercise or activity that you enjoy, and set realistic goals.  Start slowly, and gradually increase the intensity of your exercise over time.  What do I need to know about managing my diabetes?  Check your blood glucose before and after exercising. ? If your blood glucose is higher  than 240 mg/dL (13.3 mmol/L) before you exercise, check your urine for ketones. If you have ketones in your urine, do not exercise until your blood glucose returns to normal.  Know the symptoms of low blood glucose (hypoglycemia) and how to treat it. Your risk for hypoglycemia increases during and after exercise. Common symptoms of hypoglycemia can include: ? Hunger. ? Anxiety. ? Sweating and feeling clammy. ? Confusion. ? Dizziness or feeling light-headed. ? Increased heart rate or palpitations. ? Blurry vision. ? Tingling or numbness around the mouth, lips, or tongue. ? Tremors or shakes. ? Irritability.  Keep a rapid-acting carbohydrate snack available before, during, and after exercise to help prevent or treat hypoglycemia.  Avoid injecting insulin into areas of the body that are going to be exercised. For example, avoid injecting insulin into: ? The arms, when playing tennis. ? The legs, when jogging.  Keep records of your exercise habits. Doing this can help you and your health care provider adjust your diabetes management plan as needed. Write down: ? Food that you eat before and after you exercise. ? Blood glucose levels before and after you exercise. ? The type and amount of exercise you have done. ? When your insulin is expected to peak, if you use insulin. Avoid exercising at times when your insulin is peaking.  When you start a new  exercise or activity, work with your health care provider to make sure the activity is safe for you, and to adjust your insulin, medicines, or food intake as needed.  Drink plenty of water while you exercise to prevent dehydration or heat stroke. Drink enough fluid to keep your urine clear or pale yellow. This information is not intended to replace advice given to you by your health care provider. Make sure you discuss any questions you have with your health care provider. Document Released: 12/27/2003 Document Revised: 04/25/2016 Document Reviewed: 03/17/2016 Elsevier Interactive Patient Education  2018 [employer].

## 2017-09-23 NOTE — Progress Notes (Signed)
Tammy Graham, is a 66 y.o. female  KAJ:[id number]  MBT:[id number]  DOB - 1951/06/19  Chief Complaint  Patient presents with  . Medication Refill       Subjective:   Tammy Graham is a 66 y.o. female with history of hypertension, coronary artery disease, hyperlipidemia, diabetes mellitus, generalized anxiety and obstructive sleep apnea who presents here today for a follow up visit and medication refills. She has no new complaint today. She will like to restart her Effient, she does not "like" Plavix which she says gives her headache. She claims adherence with her medications, she is working so hard on her diet, average fasting BG has been around 110. She reports no hypoglycemic episode. She is doing well overall. She needs refills on all her medications and diabetic supplies. She continues to smoke cigarette but working on Quitting after the holidays. Patient has No headache, No chest pain, No abdominal pain - No Nausea, No new weakness tingling or numbness, No Cough - SOB.  No problems updated.  ALLERGIES: Allergies  Allergen Reactions  . Benadryl [Diphenhydramine Hcl (Sleep)] Other (See Comments)    unknown  . Rosuvastatin Hives    Patient is allergic to generic rosuvastatin. She says she is able to take branded Crestor.   . Simvastatin Other (See Comments)    unknown    PAST MEDICAL HISTORY: Past Medical History:  Diagnosis Date  . Anxiety   . Arthritis    shoulder and knees  . CAD (coronary artery disease)   . Chest pain   . DM (diabetes mellitus) ([place])   . HTN (hypertension)   . Hyperlipemia   . Myocardial infarction ([place])   . Sleep apnea     MEDICATIONS AT HOME: Prior to Admission medications   Medication Sig Start Date End Date Taking? Authorizing Provider  acetaminophen-codeine (TYLENOL #3) 300-30 MG tablet Take 1 tablet every 4 (four) hours as needed by mouth for moderate pain. 09/07/17  Yes Tresa Garter, MD  amLODipine (NORVASC) 10 MG tablet Take 1  tablet (10 mg total) by mouth daily. 09/23/17  Yes Tresa Garter, MD  aspirin 81 MG tablet Take 1 tablet (81 mg total) by mouth daily. 09/21/14  Yes Tresa Garter, MD  Blood Glucose Monitoring Suppl (ACCU-CHEK AVIVA PLUS) w/Device KIT Use as directed for daily testing of blood sugar. E11.9 09/23/17  Yes Tresa Garter, MD  carvedilol (COREG) 12.5 MG tablet Take 1 tablet (12.5 mg total) by mouth 2 (two) times daily with a meal. 09/23/17  Yes Elishah Ashmore E, MD  clopidogrel (PLAVIX) 75 MG tablet TAKE 1 TABLET (75 MG TOTAL) BY MOUTH DAILY. 07/14/17  Yes Tresa Garter, MD  clotrimazole (GYNE-LOTRIMIN 3) 2 % vaginal cream Place 1 Applicatorful vaginally at bedtime. 11/12/16  Yes Tresa Garter, MD  famotidine (PEPCID) 20 MG tablet Take 1 tablet (20 mg total) by mouth 2 (two) times daily. 09/12/16  Yes Carlota Raspberry, Tiffany, PA-C  glipiZIDE (GLUCOTROL) 10 MG tablet Take 1 tablet (10 mg total) by mouth 2 (two) times daily before a meal. 09/23/17  Yes Heinz Eckert E, MD  glucose blood (ACCU-CHEK AVIVA PLUS) test strip Use as instructed for daily testing of blood sugar. E.11.9 03/31/17  Yes Duff Pozzi, Marlena Clipper, MD  hydrochlorothiazide (HYDRODIURIL) 25 MG tablet Take 1 tablet (25 mg total) by mouth daily. 09/23/17  Yes Tresa Garter, MD  hydrOXYzine (ATARAX/VISTARIL) 25 MG tablet Take 1 tablet (25 mg total) by mouth every 6 (six) hours. 09/12/16  Yes Carlota Raspberry, Tiffany, PA-C  Lancets (ACCU-CHEK SOFT TOUCH) lancets Use as instructed for daily testing of blood sugar. E11.9 09/23/17  Yes Tresa Garter, MD  metFORMIN (GLUCOPHAGE) 1000 MG tablet Take 1 tablet (1,000 mg total) by mouth 2 (two) times daily with a meal. 09/23/17  Yes Aaryana Betke E, MD  Multiple Vitamin (MULTIVITAMIN WITH MINERALS) TABS tablet Take 1 tablet by mouth daily.   Yes [provider]  nitroGLYCERIN (NITROSTAT) 0.4 MG SL tablet Place 1 tablet (0.4 mg total) under the tongue every 5 (five)  minutes as needed. Patient taking differently: Place 0.4 mg under the tongue every 5 (five) minutes as needed for chest pain.  10/02/15  Yes Tresa Garter, MD  rosuvastatin (CRESTOR) 20 MG tablet Take 1 tablet (20 mg total) by mouth daily. 09/23/17  Yes Tresa Garter, MD  prasugrel (EFFIENT) 10 MG TABS tablet Take 1 tablet (10 mg total) by mouth daily. 09/23/17   Tresa Garter, MD    Objective:   Vitals:   09/23/17 1407  BP: 126/70  Pulse: 90  Resp: 18  Temp: 98.6 F (37 C)  TempSrc: Oral  SpO2: 97%  Weight: 164 lb (74.4 kg)  Height: 5' 7"  (1.702 m)   Exam General appearance : Awake, alert, not in any distress. Speech Clear. Not toxic looking HEENT: Atraumatic and Normocephalic, pupils equally reactive to light and accomodation Neck: Supple, no JVD. No cervical lymphadenopathy.  Chest: Good air entry bilaterally, no added sounds  CVS: S1 S2 regular, no murmurs.  Abdomen: Bowel sounds present, Non tender and not distended with no gaurding, rigidity or rebound. Extremities: B/L Lower Ext shows no edema, both legs are warm to touch Neurology: Awake alert, and oriented X 3, CN II-XII intact, Non focal Skin: No Rash  Data Review Lab Results  Component Value Date   HGBA1C 6.6 09/23/2017   HGBA1C 7.1 04/15/2017   HGBA1C 8.9 01/28/2017    Assessment & Plan   1. Type 2 diabetes mellitus without complication, without long-term current use of insulin (HCC)  - HgB A1c - Glucose (CBG) - glipiZIDE (GLUCOTROL) 10 MG tablet; Take 1 tablet (10 mg total) by mouth 2 (two) times daily before a meal.  Dispense: 60 tablet; Refill: 3 - metFORMIN (GLUCOPHAGE) 1000 MG tablet; Take 1 tablet (1,000 mg total) by mouth 2 (two) times daily with a meal.  Dispense: 180 tablet; Refill: 3 - CMP14+EGFR - Ambulatory referral to Podiatry  2. Essential hypertension  - amLODipine (NORVASC) 10 MG tablet; Take 1 tablet (10 mg total) by mouth daily.  Dispense: 90 tablet; Refill: 3 -  carvedilol (COREG) 12.5 MG tablet; Take 1 tablet (12.5 mg total) by mouth 2 (two) times daily with a meal.  Dispense: 180 tablet; Refill: 3 - hydrochlorothiazide (HYDRODIURIL) 25 MG tablet; Take 1 tablet (25 mg total) by mouth daily.  Dispense: 90 tablet; Refill: 3 - Urinalysis, Complete - VITAMIN D 25 Hydroxy (Vit-D Deficiency, Fractures)  3. Dyslipidemia  - rosuvastatin (CRESTOR) 20 MG tablet; Take 1 tablet (20 mg total) by mouth daily.  Dispense: 90 tablet; Refill: 3 - Lipid panel  4. Tobacco use  Tammy Graham was counseled on the dangers of tobacco use, and was advised to quit. Reviewed strategies to maximize success, including removing cigarettes and smoking materials from environment, stress management and support of family/friends.  Patient have been counseled extensively about nutrition and exercise. Other issues discussed during this visit include: low cholesterol diet, weight control and daily exercise, foot  care, annual eye examinations at Ophthalmology, importance of adherence with medications and regular follow-up. We also discussed long term complications of uncontrolled diabetes and hypertension.   Return in about 6 months (around 03/24/2018) for Hemoglobin A1C and Follow up, DM, Follow up HTN, Routine Follow Up.  The patient was given clear instructions to go to ER or return to medical center if symptoms don't improve, worsen or new problems develop. The patient verbalized understanding. The patient was told to call to get lab results if they haven't heard anything in the next week.   This note has been created with Surveyor, quantity. Any transcriptional errors are unintentional.    Angelica Chessman, MD, [place], Karilyn Cota, [place] and [place], [place]   09/23/2017, 3:01 PM

## 2017-09-24 ENCOUNTER — Other Ambulatory Visit: Payer: Self-pay | Admitting: Internal Medicine

## 2017-09-24 LAB — CMP14+EGFR
ALK PHOS: 68 IU/L (ref 39–117)
ALT: 15 IU/L (ref 0–32)
AST: 21 IU/L (ref 0–40)
Albumin/Globulin Ratio: 1.8 (ref 1.2–2.2)
Albumin: 4.4 g/dL (ref 3.6–4.8)
BUN/Creatinine Ratio: 10 — ABNORMAL LOW (ref 12–28)
BUN: 11 mg/dL (ref 8–27)
Bilirubin Total: 0.4 mg/dL (ref 0.0–1.2)
CALCIUM: 10.1 mg/dL (ref 8.7–10.3)
CO2: 29 mmol/L (ref 20–29)
CREATININE: 1.13 mg/dL — AB (ref 0.57–1.00)
Chloride: 104 mmol/L (ref 96–106)
GFR calc Af Amer: 59 mL/min/{1.73_m2} — ABNORMAL LOW (ref >59)
GFR, EST NON AFRICAN AMERICAN: 51 mL/min/{1.73_m2} — AB (ref >59)
GLOBULIN, TOTAL: 2.5 g/dL (ref 1.5–4.5)
GLUCOSE: 186 mg/dL — AB (ref 65–99)
Potassium: 4 mmol/L (ref 3.5–5.2)
Sodium: 145 mmol/L — ABNORMAL HIGH (ref 134–144)
Total Protein: 6.9 g/dL (ref 6.0–8.5)

## 2017-09-24 LAB — LIPID PANEL
CHOL/HDL RATIO: 2.1 ratio (ref 0.0–4.4)
CHOLESTEROL TOTAL: 91 mg/dL — AB (ref 100–199)
HDL: 44 mg/dL (ref >39)
LDL CALC: 13 mg/dL (ref 0–99)
TRIGLYCERIDES: 168 mg/dL — AB (ref 0–149)
VLDL CHOLESTEROL CAL: 34 mg/dL (ref 5–40)

## 2017-09-24 LAB — MICROSCOPIC EXAMINATION
CASTS: NONE SEEN /LPF
Epithelial Cells (non renal): >10 /hpf — AB (ref 0–10)
WBC, UA: >30 /hpf — AB (ref 0–?)

## 2017-09-24 LAB — URINALYSIS, COMPLETE
Bilirubin, UA: NEGATIVE
Ketones, UA: NEGATIVE
Nitrite, UA: POSITIVE — AB
PH UA: 5 (ref 5.0–7.5)
PROTEIN UA: NEGATIVE
Specific Gravity, UA: 1.022 (ref 1.005–1.030)
Urobilinogen, Ur: 1 mg/dL (ref 0.2–1.0)

## 2017-09-24 LAB — VITAMIN D 25 HYDROXY (VIT D DEFICIENCY, FRACTURES): VIT D 25 HYDROXY: 25.9 ng/mL — AB (ref 30.0–100.0)

## 2017-09-24 MED ORDER — CEPHALEXIN 500 MG PO CAPS: 500.0000 mg | ORAL_CAPSULE | Freq: Two times a day (BID) | ORAL | 0 refills | Status: AC

## 2017-09-30 ENCOUNTER — Telehealth: Payer: Self-pay | Admitting: *Deleted

## 2017-09-30 NOTE — Telephone Encounter (Signed)
-----   Message from Tresa Garter, MD sent at 09/24/2017 11:59 AM EST ----- Kidney function appear stable over the past year. Vitamin D is slightly low, please take OTC vitamin D supplement daily. Urine is positive for infection, antibiotics prescribed to the pharmacy, please take as prescribed.

## 2017-09-30 NOTE — Telephone Encounter (Signed)
Patient verified DOB Patient is aware of kidney function being stable over the past year. Patient advised to pick up OTC Vitamin d and take daily. Patient is also aware of prescription for urine infection being sent to pharmacy for pickup.

## 2017-10-28 ENCOUNTER — Telehealth: Payer: Self-pay | Admitting: Internal Medicine

## 2017-10-28 NOTE — Telephone Encounter (Signed)
Pt came in to request a medication refill on  -rosuvastatin (CRESTOR) 20 MG tablet -LISINOPRIL 20-25 MG -Lancets (ACCU-CHEK SOFT TOUCH) lancets  -glucose blood (ACCU-CHEK AVIVA PLUS) test strip  If approved please send to  [facility] [phone] - [place], [place] - [address] Please follow up  -CLOPIDOGREL 75 MG: makes her feel like she is having a stroke so she has discontinued use. Please give her a call back for other options

## 2017-10-28 NOTE — Telephone Encounter (Signed)
Patient is aware of discussing the clopidogrel with cardiologist.

## 2017-11-02 ENCOUNTER — Other Ambulatory Visit: Payer: Self-pay | Admitting: Internal Medicine

## 2017-11-02 DIAGNOSIS — I10 Essential (primary) hypertension: Secondary | ICD-10-CM

## 2017-11-02 MED FILL — ACCU-CHEK SOFTCLIX LANCETS: 100 days supply | Qty: 100 | Fill #1

## 2017-11-24 ENCOUNTER — Other Ambulatory Visit: Payer: Self-pay | Admitting: Internal Medicine

## 2017-12-01 ENCOUNTER — Other Ambulatory Visit: Payer: Self-pay | Admitting: Internal Medicine

## 2017-12-01 DIAGNOSIS — M545 Low back pain: Principal | ICD-10-CM

## 2017-12-01 DIAGNOSIS — G8929 Other chronic pain: Secondary | ICD-10-CM

## 2017-12-04 ENCOUNTER — Other Ambulatory Visit: Payer: Self-pay | Admitting: Internal Medicine

## 2017-12-04 DIAGNOSIS — I10 Essential (primary) hypertension: Secondary | ICD-10-CM

## 2017-12-04 MED FILL — ACCU-CHEK AVIVA PLUS TEST S: 25 days supply | Qty: 50 | Fill #4

## 2017-12-04 MED FILL — PRASUGREL 10 MG TABLET: 10 | 30 days supply | Qty: 30 | Fill #0

## 2017-12-21 ENCOUNTER — Ambulatory Visit (INDEPENDENT_AMBULATORY_CARE_PROVIDER_SITE_OTHER): Payer: Medicare HMO | Admitting: Cardiovascular Disease

## 2017-12-21 ENCOUNTER — Encounter: Payer: Self-pay | Admitting: Cardiovascular Disease

## 2017-12-21 VITALS — BP 116/70 | HR 64 | Ht 67.0 in | Wt 165.1 lb

## 2017-12-21 DIAGNOSIS — I2510 Atherosclerotic heart disease of native coronary artery without angina pectoris: Secondary | ICD-10-CM | POA: Diagnosis not present

## 2017-12-21 NOTE — Patient Instructions (Signed)
Medication Instructions:  Your physician has recommended you make the following change in your medication:  Stop Effient   Labwork: none  Testing/Procedures: none  Follow-Up: Your physician recommends that you schedule a follow-up appointment in: 12 months. Please call our office in about 9 months to schedule this appointment    Any Other Special Instructions Will Be Listed Below (If Applicable).     If you need a refill on your cardiac medications before your next appointment, please call your pharmacy.

## 2017-12-21 NOTE — Progress Notes (Signed)
Chief Complaint  Patient presents with  . New Patient (Initial Visit)    CAD   History of Present Illness: 67 yo female with history of CAD, DM, HTN, HLD, tobacco abuse, anxiety and sleep apnea who is here today as a new patient to establish cardiology care. She had been seen in our practice back in 2012. She had a NSTEMI in 2012 and I placed a drug eluting stent in the ostium of her LAD. Mild disease noted in the RCA at that time. LV systolic function was normal. She is a current everyday smoker. She has diabetes and HTN. She was lost to follow up in our office since 2012 but she did see Dr. Verl Blalock in the [facility] in 2015. She did not follow up there after 2015.   She tells me today that she has no chest pain, dyspnea, palpitations, lower extremity edema, orthopnea, PND, dizziness, near syncope or syncope.    Primary Care Physician: Tresa Garter, MD   Past Medical History:  Diagnosis Date  . Anxiety   . Arthritis    shoulder and knees  . CAD (coronary artery disease)   . DM (diabetes mellitus) ([place])   . HTN (hypertension)   . Hyperlipemia   . Myocardial infarction ([place])   . Sleep apnea     Past Surgical History:  Procedure Laterality Date  . CARDIAC CATHETERIZATION    . COLONOSCOPY    . COLONOSCOPY  09/25/2011   Procedure: COLONOSCOPY;  Surgeon: Winfield Cunas., MD;  Location: [facility];  Service: Endoscopy;  Laterality: N/A;  . CORONARY STENT PLACEMENT     Ostial LAD stent in 2012  . Left knee surgery      Current Outpatient Medications  Medication Sig Dispense Refill  . acetaminophen-codeine (TYLENOL #3) 300-30 MG tablet TAKE 1 TABLET BY MOUTH EVERY 4 HOURS AS NEEDED FOR MODERATE PAIN 60 tablet 0  . amLODipine (NORVASC) 10 MG tablet Take 1 tablet (10 mg total) by mouth daily. 90 tablet 3  . aspirin 81 MG tablet Take 1 tablet (81 mg total) by mouth daily. 90 tablet 3  . Blood Glucose Monitoring Suppl (ACCU-CHEK AVIVA PLUS)  w/Device KIT Use as directed for daily testing of blood sugar. E11.9 1 kit 0  . carvedilol (COREG) 12.5 MG tablet Take 1 tablet (12.5 mg total) by mouth 2 (two) times daily with a meal. 180 tablet 3  . clotrimazole (GYNE-LOTRIMIN 3) 2 % vaginal cream Place 1 Applicatorful vaginally at bedtime. 21 g 0  . famotidine (PEPCID) 20 MG tablet Take 1 tablet (20 mg total) by mouth 2 (two) times daily. 30 tablet 0  . glipiZIDE (GLUCOTROL) 10 MG tablet Take 1 tablet (10 mg total) by mouth 2 (two) times daily before a meal. 60 tablet 3  . glucose blood (ACCU-CHEK AVIVA PLUS) test strip Use as instructed for daily testing of blood sugar. E.11.9 100 each 12  . hydrochlorothiazide (HYDRODIURIL) 25 MG tablet Take 1 tablet (25 mg total) by mouth daily. 90 tablet 3  . hydrOXYzine (ATARAX/VISTARIL) 25 MG tablet Take 1 tablet (25 mg total) by mouth every 6 (six) hours. 12 tablet 0  . Lancets (ACCU-CHEK SOFT TOUCH) lancets Use as instructed for daily testing of blood sugar. E11.9 100 each 12  . metFORMIN (GLUCOPHAGE) 1000 MG tablet Take 1 tablet (1,000 mg total) by mouth 2 (two) times daily with a meal. 180 tablet 3  . Multiple Vitamin (MULTIVITAMIN WITH MINERALS) TABS tablet  Take 1 tablet by mouth daily.    . nitroGLYCERIN (NITROSTAT) 0.4 MG SL tablet Place 0.4 mg under the tongue every 5 (five) minutes as needed for chest pain.    . rosuvastatin (CRESTOR) 20 MG tablet Take 1 tablet (20 mg total) by mouth daily. 90 tablet 3   No current facility-administered medications for this visit.     Allergies  Allergen Reactions  . Benadryl [Diphenhydramine Hcl (Sleep)] Other (See Comments)    unknown  . Rosuvastatin Hives    Patient is allergic to generic rosuvastatin. She says she is able to take branded Crestor.   . Simvastatin Other (See Comments)    unknown    Social History   Socioeconomic History  . Marital status: Widowed    Spouse name: Not on file  . Number of children: Not on file  . Years of  education: Not on file  . Highest education level: Not on file  Social Needs  . Financial resource strain: Not on file  . Food insecurity - worry: Not on file  . Food insecurity - inability: Not on file  . Transportation needs - medical: Not on file  . Transportation needs - non-medical: Not on file  Occupational History  . Not on file  Tobacco Use  . Smoking status: Current Every Day Smoker    Packs/day: 0.25    Years: 15.00    Pack years: 3.75    Types: Cigarettes  . Smokeless tobacco: Never Used  . Tobacco comment: 4-5 daily  Substance and Sexual Activity  . Alcohol use: Yes    Alcohol/week: 0.6 oz    Types: 1 Shots of liquor per week    Comment: 3 weeks ago  . Drug use: No  . Sexual activity: Not Currently    Birth control/protection: None  Other Topics Concern  . Not on file  Social History Narrative  . Not on file    Family History  Problem Relation Age of Onset  . Anesthesia problems Neg Hx   . Hypotension Neg Hx   . Malignant hyperthermia Neg Hx   . Pseudochol deficiency Neg Hx     Review of Systems:  As stated in the HPI and otherwise negative.   BP 116/70 (BP Location: Left Arm, Patient Position: Sitting, Cuff Size: Large)   Pulse 64   Ht 5' 7"  (1.702 m)   Wt 165 lb 1.9 oz (74.9 kg)   SpO2 97%   BMI 25.86 kg/m   Physical Examination: General: Well developed, well nourished, NAD  HEENT: OP clear, mucus membranes moist  SKIN: warm, dry. No rashes. Neuro: No focal deficits  Musculoskeletal: Muscle strength 5/5 all ext  Psychiatric: Mood and affect normal  Neck: No JVD, no carotid bruits, no thyromegaly, no lymphadenopathy.  Lungs:Clear bilaterally, no wheezes, rhonci, crackles Cardiovascular: Regular rate and rhythm. No murmurs, gallops or rubs. Abdomen:Soft. Bowel sounds present. Non-tender.  Extremities: No lower extremity edema. Pulses are 2 + in the bilateral DP/PT.  EKG:  EKG is ordered today. The ekg ordered today demonstrates NSR, rate 64  bpm.   Recent Labs: 09/23/2017: ALT 15; BUN 11; Creatinine, Ser 1.13; Potassium 4.0; Sodium 145   Lipid Panel    Component Value Date/Time   CHOL 91 (L) 09/23/2017 1532   TRIG 168 (H) 09/23/2017 1532   HDL 44 09/23/2017 1532   CHOLHDL 2.1 09/23/2017 1532   CHOLHDL 3.1 05/04/2014 0919   VLDL 18 05/04/2014 0919   LDLCALC 13 09/23/2017 1532  Wt Readings from Last 3 Encounters:  12/21/17 165 lb 1.9 oz (74.9 kg)  09/23/17 164 lb (74.4 kg)  04/15/17 168 lb (76.2 kg)     Other studies Reviewed: Additional studies/ records that were reviewed today include:  Review of the above records demonstrates:    Assessment and Plan:   1. CAD without angina: She is doing well. No chest pain. Will continue ASA, statin and beta blocker. I will stop Effient. She is now 7 years post DES placement. LDL at goal. BP is controlled.   2. Tobacco abuse: She is asked to stop smoking.   3. HTN: BP is well controlled.   Current medicines are reviewed at length with the patient today.  The patient does not have concerns regarding medicines.  The following changes have been made:  no change  Labs/ tests ordered today include:   Orders Placed This Encounter  Procedures  . EKG 12-Lead     Disposition:   FU with me in 12 months   Signed, Lauree Chandler, MD 12/21/2017 10:27 AM    [facility] [place], [place], [place]  [address] Phone: [phone]; Fax: [phone]

## 2018-01-12 MED FILL — ACCU-CHEK AVIVA PLUS TEST S: 25 days supply | Qty: 50 | Fill #5

## 2018-02-08 ENCOUNTER — Other Ambulatory Visit: Payer: Self-pay | Admitting: Family Medicine

## 2018-02-08 ENCOUNTER — Other Ambulatory Visit: Payer: Self-pay | Admitting: Internal Medicine

## 2018-02-08 DIAGNOSIS — E119 Type 2 diabetes mellitus without complications: Secondary | ICD-10-CM

## 2018-03-03 MED FILL — ACCU-CHEK AVIVA PLUS TEST S: 25 days supply | Qty: 50 | Fill #6

## 2018-03-08 MED FILL — ACCU-CHEK SOFTCLIX LANCETS: 100 days supply | Qty: 100 | Fill #2

## 2018-03-09 ENCOUNTER — Other Ambulatory Visit: Payer: Self-pay | Admitting: Family Medicine

## 2018-03-09 DIAGNOSIS — E119 Type 2 diabetes mellitus without complications: Secondary | ICD-10-CM

## 2018-04-10 ENCOUNTER — Other Ambulatory Visit: Payer: Self-pay | Admitting: Family Medicine

## 2018-04-10 DIAGNOSIS — E119 Type 2 diabetes mellitus without complications: Secondary | ICD-10-CM

## 2018-04-13 ENCOUNTER — Telehealth: Payer: Self-pay | Admitting: Internal Medicine

## 2018-04-13 ENCOUNTER — Other Ambulatory Visit: Payer: Self-pay

## 2018-04-13 DIAGNOSIS — E119 Type 2 diabetes mellitus without complications: Secondary | ICD-10-CM

## 2018-04-13 MED ORDER — GLIPIZIDE 10 MG PO TABS
ORAL_TABLET | ORAL | 0 refills | Status: DC
Start: 2018-04-13 — End: 2018-05-11

## 2018-04-13 NOTE — Telephone Encounter (Signed)
Rx sent 

## 2018-04-13 NOTE — Telephone Encounter (Signed)
Patient called requesting a refill on glipiZIDE (GLUCOTROL) 10 MG tablet  Patient has an appt. Scheduled for 04/29/18. Patient uses [facility] in [place]

## 2018-04-29 ENCOUNTER — Other Ambulatory Visit: Payer: Self-pay

## 2018-04-29 ENCOUNTER — Ambulatory Visit: Payer: Medicare HMO

## 2018-04-29 MED ORDER — GLUCOSE BLOOD VI STRP: ORAL_STRIP | 12 refills | Status: DC

## 2018-04-30 MED FILL — ACCU-CHEK AVIVA PLUS TEST S: 90 days supply | Qty: 100 | Fill #0

## 2018-05-04 ENCOUNTER — Telehealth: Payer: Self-pay | Admitting: Internal Medicine

## 2018-05-04 NOTE — Telephone Encounter (Signed)
Pt came in to request a medication refill  -acetaminophen-codeine (TYLENOL #3) 300-30 MG tablet To [facility] [place], [place] - [address] AT [address] Please follow up

## 2018-05-09 ENCOUNTER — Other Ambulatory Visit: Payer: Self-pay | Admitting: Nurse Practitioner

## 2018-05-09 DIAGNOSIS — E119 Type 2 diabetes mellitus without complications: Secondary | ICD-10-CM

## 2018-05-09 DIAGNOSIS — M545 Low back pain: Principal | ICD-10-CM

## 2018-05-09 DIAGNOSIS — G8929 Other chronic pain: Secondary | ICD-10-CM

## 2018-05-09 MED ORDER — ACETAMINOPHEN-CODEINE #3 300-30 MG PO TABS: 1.0000 | ORAL_TABLET | Freq: Three times a day (TID) | ORAL | 0 refills | Status: DC | PRN

## 2018-05-11 ENCOUNTER — Encounter: Payer: Self-pay | Admitting: Nurse Practitioner

## 2018-05-11 ENCOUNTER — Ambulatory Visit: Payer: Medicare HMO | Attending: Nurse Practitioner | Admitting: Nurse Practitioner

## 2018-05-11 VITALS — BP 119/75 | HR 73 | Temp 98.4°F | Ht 67.0 in | Wt 166.8 lb

## 2018-05-11 DIAGNOSIS — Z9889 Other specified postprocedural states: Secondary | ICD-10-CM | POA: Diagnosis not present

## 2018-05-11 DIAGNOSIS — G8929 Other chronic pain: Secondary | ICD-10-CM

## 2018-05-11 DIAGNOSIS — E119 Type 2 diabetes mellitus without complications: Secondary | ICD-10-CM | POA: Insufficient documentation

## 2018-05-11 DIAGNOSIS — Z79899 Other long term (current) drug therapy: Secondary | ICD-10-CM | POA: Diagnosis not present

## 2018-05-11 DIAGNOSIS — F17200 Nicotine dependence, unspecified, uncomplicated: Secondary | ICD-10-CM

## 2018-05-11 DIAGNOSIS — E782 Mixed hyperlipidemia: Secondary | ICD-10-CM | POA: Insufficient documentation

## 2018-05-11 DIAGNOSIS — Z7984 Long term (current) use of oral hypoglycemic drugs: Secondary | ICD-10-CM | POA: Insufficient documentation

## 2018-05-11 DIAGNOSIS — I10 Essential (primary) hypertension: Secondary | ICD-10-CM | POA: Insufficient documentation

## 2018-05-11 DIAGNOSIS — M545 Low back pain: Secondary | ICD-10-CM

## 2018-05-11 DIAGNOSIS — Z888 Allergy status to other drugs, medicaments and biological substances status: Secondary | ICD-10-CM | POA: Insufficient documentation

## 2018-05-11 DIAGNOSIS — F17210 Nicotine dependence, cigarettes, uncomplicated: Secondary | ICD-10-CM | POA: Diagnosis not present

## 2018-05-11 DIAGNOSIS — Z760 Encounter for issue of repeat prescription: Secondary | ICD-10-CM | POA: Diagnosis not present

## 2018-05-11 DIAGNOSIS — M1712 Unilateral primary osteoarthritis, left knee: Secondary | ICD-10-CM | POA: Diagnosis not present

## 2018-05-11 DIAGNOSIS — E785 Hyperlipidemia, unspecified: Secondary | ICD-10-CM | POA: Insufficient documentation

## 2018-05-11 LAB — GLUCOSE, POCT (MANUAL RESULT ENTRY): POC GLUCOSE: 120 mg/dL — AB (ref 70–99)

## 2018-05-11 MED ORDER — CARVEDILOL 12.5 MG PO TABS: 12.5000 mg | ORAL_TABLET | Freq: Two times a day (BID) | ORAL | 3 refills | Status: DC

## 2018-05-11 MED ORDER — FREESTYLE PRECISION NEO SYSTEM W/DEVICE KIT: 1.0000 | PACK | Freq: Two times a day (BID) | 0 refills | Status: DC

## 2018-05-11 MED ORDER — GLUCOSE BLOOD VI STRP: ORAL_STRIP | 12 refills | Status: DC

## 2018-05-11 MED ORDER — ROSUVASTATIN CALCIUM 20 MG PO TABS: 20.0000 mg | ORAL_TABLET | Freq: Every day | ORAL | 3 refills | Status: DC

## 2018-05-11 MED ORDER — NICOTINE 14 MG/24HR TD PT24
14.0000 mg | MEDICATED_PATCH | Freq: Every day | TRANSDERMAL | 0 refills | Status: AC
Start: 2018-05-11 — End: 2018-06-22

## 2018-05-11 MED ORDER — METFORMIN HCL 1000 MG PO TABS: 1000.0000 mg | ORAL_TABLET | Freq: Two times a day (BID) | ORAL | 3 refills | Status: DC

## 2018-05-11 MED ORDER — NICOTINE 7 MG/24HR TD PT24: 7.0000 mg | MEDICATED_PATCH | Freq: Every day | TRANSDERMAL | 0 refills | Status: AC

## 2018-05-11 MED ORDER — AMLODIPINE BESYLATE 10 MG PO TABS: 10.0000 mg | ORAL_TABLET | Freq: Every day | ORAL | 3 refills | Status: DC

## 2018-05-11 MED ORDER — HYDROCHLOROTHIAZIDE 25 MG PO TABS: 25.0000 mg | ORAL_TABLET | Freq: Every day | ORAL | 3 refills | Status: DC

## 2018-05-11 MED ORDER — ACETAMINOPHEN-CODEINE #3 300-30 MG PO TABS: 1.0000 | ORAL_TABLET | Freq: Three times a day (TID) | ORAL | 0 refills | Status: DC | PRN

## 2018-05-11 MED ORDER — GLIPIZIDE 10 MG PO TABS: ORAL_TABLET | ORAL | 3 refills | Status: DC

## 2018-05-11 MED ORDER — ASPIRIN 81 MG PO TABS: 81.0000 mg | ORAL_TABLET | Freq: Every day | ORAL | 3 refills | Status: DC

## 2018-05-11 MED ORDER — FREESTYLE LANCETS MISC: 12 refills | Status: DC

## 2018-05-11 NOTE — Patient Instructions (Signed)

## 2018-05-11 NOTE — Progress Notes (Signed)
Assessment & Plan:  Tammy Graham was seen today for establish care and medication refill.  Diagnoses and all orders for this visit:  Type 2 diabetes mellitus without complication, without long-term current use of insulin (HCC) -     Glucose (CBG) -     glipiZIDE (GLUCOTROL) 10 MG tablet; TAKE 1 TABLET BY MOUTH TWICE DAILY BEFORE A MEAL -     metFORMIN (GLUCOPHAGE) 1000 MG tablet; Take 1 tablet (1,000 mg total) by mouth 2 (two) times daily with a meal. -     Hemoglobin A1c -     Microalbumin / creatinine urine ratio -     Ambulatory referral to Ophthalmology Continue blood sugar control as discussed in office today, low carbohydrate diet, and regular physical exercise as tolerated, 150 minutes per week (30 min each day, 5 days per week, or 50 min 3 days per week). Keep blood sugar logs with fasting goal of 90-130 mg/dl, post prandial (after you eat) less than 180.  For Hypoglycemia: BS <60 and Hyperglycemia BS >400; contact the clinic ASAP. Annual eye exams and foot exams are recommended.   Essential hypertension -     amLODipine (NORVASC) 10 MG tablet; Take 1 tablet (10 mg total) by mouth daily. -     aspirin 81 MG tablet; Take 1 tablet (81 mg total) by mouth daily. -     CBC -     CMP14+EGFR -     hydrochlorothiazide (HYDRODIURIL) 25 MG tablet; Take 1 tablet (25 mg total) by mouth daily. -     carvedilol (COREG) 12.5 MG tablet; Take 1 tablet (12.5 mg total) by mouth 2 (two) times daily with a meal. Continue all antihypertensives as prescribed.  Remember to bring in your blood pressure log with you for your follow up appointment.  DASH/Mediterranean Diets are healthier choices for HTN.   Mixed hyperlipidemia -     Lipid panel -     rosuvastatin (CRESTOR) 20 MG tablet; Take 1 tablet (20 mg total) by mouth daily. INSTRUCTIONS: Work on a low fat, heart healthy diet and participate in regular aerobic exercise program by working out at least 150 minutes per week; 5 days a week-30 minutes per  day. Avoid red meat, fried foods. junk foods, sodas, sugary drinks, unhealthy snacking, alcohol and smoking.  Drink at least 48oz of water per day and monitor your carbohydrate intake daily.    Localized osteoarthritis of left knee -     ToxASSURE Select 13 (MW), Urine  Chronic midline low back pain without sciatica -     acetaminophen-codeine (TYLENOL #3) 300-30 MG tablet; Take 1-2 tablets by mouth every 8 (eight) hours as needed for moderate pain. Work on losing weight to help reduce back pain. May alternate with heat and ice application for pain relief. May also alternate with acetaminophen and Ibuprofen as prescribed for back pain. Other alternatives include massage, acupuncture and water aerobics.  You must stay active and avoid a sedentary lifestyle.  Tobacco dependence -     nicotine (NICODERM CQ - DOSED IN MG/24 HOURS) 14 mg/24hr patch; Place 1 patch (14 mg total) onto the skin daily. -     nicotine (NICODERM CQ - DOSED IN MG/24 HR) 7 mg/24hr patch; Place 1 patch (7 mg total) onto the skin daily for 28 days. Finlayson continues to smoke 1/2 pack of cigarettes per day. 2. Shavonne was counseled on the dangers of tobacco use, and was advised to quit. We reviewed specific strategies to maximize  success, including removing cigarettes and smoking materials from environment, stress management and support of family/friends as well as pharmacological alternatives. 3. A total of 3 minutes was spent on counseling for smoking cessation and Barney is ready to quit and has chosen Nicotine patches to start today.  4. Samauri was offered Wellbutrin, Chantix, Nicotine patch, Nicotine gum or lozenges.  Due to out of pocket costs Dejane was also given smoking cessation support and advised to contact: the Smoking Cessation hotline: 1-800-QUIT-NOW.  Tennille was also informed of our Smoking cessation classes which are also available through [facility] by calling [phone] or visit our  website at [email or web address]  5. Will follow up at next scheduled office visit.     Patient has been counseled on age-appropriate routine health concerns for screening and prevention. These are reviewed and up-to-date. Referrals have been placed accordingly. Immunizations are up-to-date or declined.    Subjective:   Chief Complaint  Patient presents with  . Establish Care    pt. is here to establish care for diabetes, and hypertension.   . Medication Refill   HPI Tammy Graham 67 y.o. female presents to office today to establish care. She has a history of DM, HTN, HPL, Primary Knee OA, and Chronic back pain.     Hypertension She is not exercising and is adherent to low salt diet.  She does not have a blood pressure log  today. Cardiac symptoms none. Denies chest pain, shortness of breath, palpitations, lightheadedness, dizziness, headaches or BLE edema. Cardiovascular risk factors: advanced age (older than 46 for men, 58 for women), diabetes mellitus, dyslipidemia and hypertension. Use of agents associated with hypertension: none.  History of target organ damage: none. She endorses medication compliance taking amlodipine 48m and HCTZ 25 mg daily.  BP Readings from Last 3 Encounters:  05/11/18 119/75  12/21/17 116/70  09/23/17 126/70    Hyperlipidemia Patient presents for follow up to hyperlipidemia.  She is medication compliant taking crestor 20 mg daily. She  denies skin xanthelasma or statin intolerance including myalgias. LDL is not at goal.  Lab Results  Component Value Date   CHOL 191 05/11/2018   Lab Results  Component Value Date   HDL 45 05/11/2018   Lab Results  Component Value Date   LDLCALC 110 (H) 05/11/2018   Lab Results  Component Value Date   TRIG 181 (H) 05/11/2018   Lab Results  Component Value Date   CHOLHDL 4.2 05/11/2018    Diabetes Mellitus Type II Current symptoms/problems include hyperglycemia and have been unchanged.  Known diabetic  complications: cardiovascular disease Cardiovascular risk factors: advanced age (older than 523for men, 686for women), diabetes mellitus, dyslipidemia, hypertension and female gender Current diabetic medications include: Metformin 1000 mg BID, glipizide 10 mg daily Eye exam current (within one year): no Weight trend: stable Prior visit with dietician: no Current monitoring regimen: office lab tests - 4 times yearly Any episodes of hypoglycemia? no Is She on ACE inhibitor or angiotensin II receptor blocker?  No  Lab Results  Component Value Date   HGBA1C 6.6 (H) 05/11/2018   HGBA1C 6.6 09/23/2017   HGBA1C 7.1 04/15/2017   Review of Systems  Constitutional: Negative for fever, malaise/fatigue and weight loss.  HENT: Negative.  Negative for nosebleeds.   Eyes: Negative.  Negative for blurred vision, double vision and photophobia.  Respiratory: Negative.  Negative for cough and shortness of breath.   Cardiovascular: Negative.  Negative for chest pain, palpitations  and leg swelling.  Gastrointestinal: Negative.  Negative for heartburn, nausea and vomiting.  Musculoskeletal: Positive for back pain and joint pain (shoulder and knees; bilaterally). Negative for myalgias.  Neurological: Negative.  Negative for dizziness, focal weakness, seizures and headaches.  Psychiatric/Behavioral: Negative.  Negative for suicidal ideas.    Past Medical History:  Diagnosis Date  . Anxiety   . Arthritis    shoulder and knees  . CAD (coronary artery disease)   . DM (diabetes mellitus) ([place])   . HTN (hypertension)   . Hyperlipemia   . Myocardial infarction ([place])   . Sleep apnea     Past Surgical History:  Procedure Laterality Date  . CARDIAC CATHETERIZATION    . COLONOSCOPY    . COLONOSCOPY  09/25/2011   Procedure: COLONOSCOPY;  Surgeon: Winfield Cunas., MD;  Location: [facility];  Service: Endoscopy;  Laterality: N/A;  . CORONARY STENT PLACEMENT     Ostial LAD stent in 2012  . Left knee  surgery      Family History  Problem Relation Age of Onset  . Heart attack Mother   . Anesthesia problems Neg Hx   . Hypotension Neg Hx   . Malignant hyperthermia Neg Hx   . Pseudochol deficiency Neg Hx     Social History Reviewed with no changes to be made today.   Outpatient Medications Prior to Visit  Medication Sig Dispense Refill  . Multiple Vitamin (MULTIVITAMIN WITH MINERALS) TABS tablet Take 1 tablet by mouth daily.    Marland Kitchen acetaminophen-codeine (TYLENOL #3) 300-30 MG tablet Take 1-2 tablets by mouth every 8 (eight) hours as needed for moderate pain. (Patient taking differently: Take 1-2 tablets by mouth every 8 (eight) hours as needed for moderate pain. ) 60 tablet 0  . amLODipine (NORVASC) 10 MG tablet Take 1 tablet (10 mg total) by mouth daily. 90 tablet 3  . aspirin 81 MG tablet Take 1 tablet (81 mg total) by mouth daily. 90 tablet 3  . Blood Glucose Monitoring Suppl (ACCU-CHEK AVIVA PLUS) w/Device KIT Use as directed for daily testing of blood sugar. E11.9 1 kit 0  . carvedilol (COREG) 12.5 MG tablet Take 1 tablet (12.5 mg total) by mouth 2 (two) times daily with a meal. 180 tablet 3  . glipiZIDE (GLUCOTROL) 10 MG tablet TAKE 1 TABLET BY MOUTH TWICE DAILY BEFORE A MEAL 60 tablet 0  . glucose blood (ACCU-CHEK AVIVA PLUS) test strip Use as instructed for daily testing of blood sugar. E.11.9 100 each 12  . hydrochlorothiazide (HYDRODIURIL) 25 MG tablet Take 1 tablet (25 mg total) by mouth daily. 90 tablet 3  . Lancets (ACCU-CHEK SOFT TOUCH) lancets Use as instructed for daily testing of blood sugar. E11.9 100 each 12  . metFORMIN (GLUCOPHAGE) 1000 MG tablet Take 1 tablet (1,000 mg total) by mouth 2 (two) times daily with a meal. 180 tablet 3  . famotidine (PEPCID) 20 MG tablet Take 1 tablet (20 mg total) by mouth 2 (two) times daily. (Patient not taking: Reported on 05/11/2018) 30 tablet 0  . nitroGLYCERIN (NITROSTAT) 0.4 MG SL tablet Place 0.4 mg under the tongue every 5 (five)  minutes as needed for chest pain.    . clotrimazole (GYNE-LOTRIMIN 3) 2 % vaginal cream Place 1 Applicatorful vaginally at bedtime. (Patient not taking: Reported on 05/11/2018) 21 g 0  . hydrOXYzine (ATARAX/VISTARIL) 25 MG tablet Take 1 tablet (25 mg total) by mouth every 6 (six) hours. (Patient not taking: Reported on 05/11/2018) 12 tablet  0  . rosuvastatin (CRESTOR) 20 MG tablet Take 1 tablet (20 mg total) by mouth daily. (Patient not taking: Reported on 05/11/2018) 90 tablet 3   No facility-administered medications prior to visit.     Allergies  Allergen Reactions  . Benadryl [Diphenhydramine Hcl (Sleep)] Other (See Comments)    unknown  . Rosuvastatin Hives    Patient is allergic to generic rosuvastatin. She says she is able to take branded Crestor.   . Simvastatin Other (See Comments)    unknown       Objective:    BP 119/75 (BP Location: Left Arm, Patient Position: Sitting, Cuff Size: Normal)   Pulse 73   Temp 98.4 F (36.9 C) (Oral)   Ht 5' 7"  (1.702 m)   Wt 166 lb 12.8 oz (75.7 kg)   SpO2 97%   BMI 26.12 kg/m  Wt Readings from Last 3 Encounters:  05/11/18 166 lb 12.8 oz (75.7 kg)  12/21/17 165 lb 1.9 oz (74.9 kg)  09/23/17 164 lb (74.4 kg)    Physical Exam  Constitutional: She is oriented to person, place, and time. She appears well-developed and well-nourished. She is cooperative.  HENT:  Head: Normocephalic and atraumatic.  Eyes: EOM are normal.  Neck: Normal range of motion.  Cardiovascular: Normal rate, regular rhythm and normal heart sounds. Exam reveals no gallop and no friction rub.  No murmur heard. Pulmonary/Chest: Effort normal and breath sounds normal. No tachypnea. No respiratory distress. She has no decreased breath sounds. She has no wheezes. She has no rhonchi. She has no rales. She exhibits no tenderness.  Abdominal: Bowel sounds are normal.  Musculoskeletal: Normal range of motion. She exhibits no edema.       Left knee: She exhibits normal range  of motion and no swelling.       Lumbar back: She exhibits no swelling and no edema.  Neurological: She is alert and oriented to person, place, and time. Coordination normal.  Skin: Skin is warm and dry.  Psychiatric: She has a normal mood and affect. Her behavior is normal. Judgment and thought content normal.  Nursing note and vitals reviewed.        Patient has been counseled extensively about nutrition and exercise as well as the importance of adherence with medications and regular follow-up. The patient was given clear instructions to go to ER or return to medical center if symptoms don't improve, worsen or new problems develop. The patient verbalized understanding.   Follow-up: Return in about 3 months (around 08/11/2018) for DM/HTN/HPL.   Gildardo Pounds, FNP-BC [facility] [place], [place]   05/12/2018, 10:39 PM

## 2018-05-11 NOTE — Progress Notes (Signed)
120

## 2018-05-12 ENCOUNTER — Encounter: Payer: Self-pay | Admitting: Nurse Practitioner

## 2018-05-12 ENCOUNTER — Telehealth: Payer: Self-pay

## 2018-05-12 LAB — LIPID PANEL
CHOLESTEROL TOTAL: 191 mg/dL (ref 100–199)
Chol/HDL Ratio: 4.2 ratio (ref 0.0–4.4)
HDL: 45 mg/dL (ref >39)
LDL CALC: 110 mg/dL — AB (ref 0–99)
Triglycerides: 181 mg/dL — ABNORMAL HIGH (ref 0–149)
VLDL Cholesterol Cal: 36 mg/dL (ref 5–40)

## 2018-05-12 LAB — CMP14+EGFR
A/G RATIO: 1.6 (ref 1.2–2.2)
ALBUMIN: 4.6 g/dL (ref 3.6–4.8)
ALK PHOS: 76 IU/L (ref 39–117)
ALT: 14 IU/L (ref 0–32)
AST: 17 IU/L (ref 0–40)
BILIRUBIN TOTAL: 0.5 mg/dL (ref 0.0–1.2)
BUN/Creatinine Ratio: 13 (ref 12–28)
BUN: 15 mg/dL (ref 8–27)
CHLORIDE: 100 mmol/L (ref 96–106)
CO2: 25 mmol/L (ref 20–29)
Calcium: 11.1 mg/dL — ABNORMAL HIGH (ref 8.7–10.3)
Creatinine, Ser: 1.15 mg/dL — ABNORMAL HIGH (ref 0.57–1.00)
GFR calc Af Amer: 57 mL/min/{1.73_m2} — ABNORMAL LOW (ref >59)
GFR calc non Af Amer: 49 mL/min/{1.73_m2} — ABNORMAL LOW (ref >59)
GLOBULIN, TOTAL: 2.8 g/dL (ref 1.5–4.5)
Glucose: 79 mg/dL (ref 65–99)
POTASSIUM: 4.2 mmol/L (ref 3.5–5.2)
SODIUM: 141 mmol/L (ref 134–144)
Total Protein: 7.4 g/dL (ref 6.0–8.5)

## 2018-05-12 LAB — CBC
HEMATOCRIT: 45.1 % (ref 34.0–46.6)
HEMOGLOBIN: 15.4 g/dL (ref 11.1–15.9)
MCH: 32 pg (ref 26.6–33.0)
MCHC: 34.1 g/dL (ref 31.5–35.7)
MCV: 94 fL (ref 79–97)
Platelets: 272 10*3/uL (ref 150–450)
RBC: 4.81 x10E6/uL (ref 3.77–5.28)
RDW: 13.4 % (ref 12.3–15.4)
WBC: 7 10*3/uL (ref 3.4–10.8)

## 2018-05-12 LAB — MICROALBUMIN / CREATININE URINE RATIO
Creatinine, Urine: 94.5 mg/dL
Microalb/Creat Ratio: 25.9 mg/g creat (ref 0.0–30.0)
Microalbumin, Urine: 24.5 ug/mL

## 2018-05-12 LAB — HEMOGLOBIN A1C
ESTIMATED AVERAGE GLUCOSE: 143 mg/dL
HEMOGLOBIN A1C: 6.6 % — AB (ref 4.8–5.6)

## 2018-05-12 MED ORDER — FREESTYLE LANCETS MISC: 12 refills | Status: DC

## 2018-05-12 MED ORDER — FREESTYLE PRECISION NEO SYSTEM W/DEVICE KIT: 1.0000 | PACK | Freq: Once | 0 refills | Status: AC

## 2018-05-12 MED ORDER — GLUCOSE BLOOD VI STRP: ORAL_STRIP | 12 refills | Status: DC

## 2018-05-12 NOTE — Telephone Encounter (Signed)
Pharmacy needed clarification on testing frequency, resent rx's to specify. Spoke w/ zelda to confirm qd testing for non-insulin dependent medicare patients is acceptable. Also included dx code E11.9 per pt chart for pharmacy billing purposes

## 2018-05-14 ENCOUNTER — Telehealth: Payer: Self-pay

## 2018-05-14 LAB — TOXASSURE SELECT 13 (MW), URINE

## 2018-05-14 NOTE — Telephone Encounter (Signed)
CMA spoke to patient to inform on lab results.  Patient verified DOB. Patient understood.  

## 2018-05-14 NOTE — Telephone Encounter (Signed)
-----   Message from Gildardo Pounds, NP sent at 05/12/2018 10:59 PM EDT ----- A1c is 6.6 and stable. Continue your current medications. Cholesterol levels are elevated. Continue your crestor as instructed.

## 2018-08-03 ENCOUNTER — Telehealth: Payer: Self-pay | Admitting: Internal Medicine

## 2018-08-03 ENCOUNTER — Other Ambulatory Visit: Payer: Self-pay | Admitting: Nurse Practitioner

## 2018-08-03 MED ORDER — FREESTYLE LIBRE SENSOR SYSTEM MISC: 1.0000 | Freq: Every day | 0 refills | Status: AC

## 2018-08-03 MED ORDER — FREESTYLE LIBRE 14 DAY READER DEVI: 1.0000 | Freq: Every day | 0 refills | Status: AC

## 2018-08-03 MED FILL — ACCU-CHEK AVIVA PLUS TEST S: 90 days supply | Qty: 100 | Fill #1

## 2018-08-03 NOTE — Telephone Encounter (Signed)
CMA spoke to patient and inform her PCP sent her meter or device patient had dropped off.  Patient understood and is aware of the pharmacy it was sent to.

## 2018-09-06 ENCOUNTER — Other Ambulatory Visit: Payer: Self-pay | Admitting: Nurse Practitioner

## 2018-09-06 DIAGNOSIS — E119 Type 2 diabetes mellitus without complications: Secondary | ICD-10-CM

## 2018-09-07 ENCOUNTER — Other Ambulatory Visit: Payer: Self-pay | Admitting: Nurse Practitioner

## 2018-09-07 DIAGNOSIS — E119 Type 2 diabetes mellitus without complications: Secondary | ICD-10-CM

## 2018-10-10 ENCOUNTER — Other Ambulatory Visit: Payer: Self-pay | Admitting: Nurse Practitioner

## 2018-10-10 DIAGNOSIS — E119 Type 2 diabetes mellitus without complications: Secondary | ICD-10-CM

## 2018-10-25 MED FILL — ACCU-CHEK AVIVA PLUS TEST S: 90 days supply | Qty: 100 | Fill #2

## 2018-11-23 ENCOUNTER — Encounter: Payer: Self-pay | Admitting: Nurse Practitioner

## 2018-11-23 ENCOUNTER — Ambulatory Visit: Payer: 59 | Attending: Nurse Practitioner | Admitting: Nurse Practitioner

## 2018-11-23 VITALS — BP 119/75 | HR 82 | Temp 98.7°F | Ht 67.0 in | Wt 163.6 lb

## 2018-11-23 DIAGNOSIS — E782 Mixed hyperlipidemia: Secondary | ICD-10-CM | POA: Diagnosis not present

## 2018-11-23 DIAGNOSIS — M25562 Pain in left knee: Secondary | ICD-10-CM

## 2018-11-23 DIAGNOSIS — G8929 Other chronic pain: Secondary | ICD-10-CM | POA: Diagnosis not present

## 2018-11-23 DIAGNOSIS — I10 Essential (primary) hypertension: Secondary | ICD-10-CM | POA: Diagnosis not present

## 2018-11-23 DIAGNOSIS — Z1231 Encounter for screening mammogram for malignant neoplasm of breast: Secondary | ICD-10-CM | POA: Diagnosis not present

## 2018-11-23 DIAGNOSIS — E119 Type 2 diabetes mellitus without complications: Secondary | ICD-10-CM

## 2018-11-23 LAB — POCT GLYCOSYLATED HEMOGLOBIN (HGB A1C): Hemoglobin A1C: 6.4 % — AB (ref 4.0–5.6)

## 2018-11-23 LAB — GLUCOSE, POCT (MANUAL RESULT ENTRY): POC Glucose: 113 mg/dl — AB (ref 70–99)

## 2018-11-23 MED ORDER — CRESTOR 20 MG PO TABS: 20.0000 mg | ORAL_TABLET | Freq: Every day | ORAL | 3 refills | Status: DC

## 2018-11-23 NOTE — Progress Notes (Signed)
Assessment & Plan:  Tammy Graham was seen today for follow-up.  Diagnoses and all orders for this visit:  Type 2 diabetes mellitus without complication, without long-term current use of insulin (HCC) -     Glucose (CBG) -     HgB A1c -     CMP14+EGFR Controlled Continue medications as prescribed.  Continue blood sugar control as discussed in office today, low carbohydrate diet, and regular physical exercise as tolerated, 150 minutes per week (30 min each day, 5 days per week, or 50 min 3 days per week). Keep blood sugar logs with fasting goal of 90-130 mg/dl, post prandial (after you eat) less than 180.  For Hypoglycemia: BS <60 and Hyperglycemia BS >400; contact the clinic ASAP. Annual eye exams and foot exams are recommended.  Essential hypertension -     CMP14+EGFR Continue all antihypertensives as prescribed.  Remember to bring in your blood pressure log with you for your follow up appointment.  DASH/Mediterranean Diets are healthier choices for HTN.    Mixed hyperlipidemia -     CRESTOR 20 MG tablet; Take 1 tablet (20 mg total) by mouth daily. INSTRUCTIONS: Work on a low fat, heart healthy diet and participate in regular aerobic exercise program by working out at least 150 minutes per week; 5 days a week-30 minutes per day. Avoid red meat, fried foods. junk foods, sodas, sugary drinks, unhealthy snacking, alcohol and smoking.  Drink at least 48oz of water per day and monitor your carbohydrate intake daily.    Chronic pain of left knee -     DG Knee Complete 4 Views Left; Future Work on losing weight to help reduce joint pain. May alternate with heat and ice application for pain relief. May also alternate with acetaminophen and Ibuprofen as prescribed pain relief. Other alternatives include massage, acupuncture and water aerobics.  You must stay active and avoid a sedentary lifestyle.  Breast cancer screening by mammogram -     MM 3D SCREEN BREAST BILATERAL; Future    Patient has  been counseled on age-appropriate routine health concerns for screening and prevention. These are reviewed and up-to-date. Referrals have bee0n placed accordingly. Immunizations are up-to-date or declined.    Subjective:   Chief Complaint  Patient presents with  . Follow-up    Paitent is here for Diabetes follow-up.    HPI Tammy Graham 68 y.o. female presents to office today for DM, HTN, and HPL. She endorses numbness and tingling on the left side (left arm and left leg).  Type 2 Diabetes Mellitus Disease course has been improving with A1c down from 6.6 to 6.4. There are no hypoglycemic symptoms.  Current diabetic treatment includes glipizide 31m BID, metformin 1000 mg BID. Patient is compliant with treatment all of the time but does not monitor blood glucose at home as instructed.  Weight is  stable. Patient follows a generally healthy diet. Meal planning includes avoidance of concentrated sweets. Patient has not seen a dietician. Patient is not compliant with exercise.   An ACE inhibitor/angiotensin II receptor blocker is being taken. Microalbumin normal.  Patient does not see a podiatrist. Eye exam is not current.  Lab Results  Component Value Date   HGBA1C 6.4 (A) 11/23/2018    CHRONIC HYPERTENSION Disease Monitoring  Blood pressure range BP Readings from Last 3 Encounters:  11/23/18 119/75  05/11/18 119/75  12/21/17 116/70  Blood pressure is well controlled.  Chest pain: no   Dyspnea: no   Claudication: no  Medication compliance:  yes, taking carvedilol 12.5  BID, HCTZ 25 mg daily and amlodipine 10 mg daily  Medication Side Effects  Lightheadedness: no   Urinary frequency: no   Edema: no   Impotence: no  Preventitive Healthcare:  Exercise: no   Diet Pattern: diet: general  Salt Restriction:  no   Hyperlipidemia Patient presents for follow up to hyperlipidemia.  She is medication compliant taking crestor 20 mg daily. She reports a rash when taking simvastatin and  rosuvastatin.  She is not diet compliant and denies statin intolerance including myalgias. LDL not at goal.  Lab Results  Component Value Date   CHOL 191 05/11/2018   Lab Results  Component Value Date   HDL 45 05/11/2018   Lab Results  Component Value Date   LDLCALC 110 (H) 05/11/2018   Lab Results  Component Value Date   TRIG 181 (H) 05/11/2018   Lab Results  Component Value Date   CHOLHDL 4.2 05/11/2018    Chronic Knee Pain She has OA of the left hip and knee pain. Knee pain is aggravated by walking and weight bearing. Other symptoms include swelling and crepitus. Relieving symptoms; Sparing use of tylenol #3. Pain is described as intense aching.   Review of Systems  Constitutional: Negative for fever, malaise/fatigue and weight loss.  HENT: Negative.  Negative for nosebleeds.   Eyes: Negative.  Negative for blurred vision, double vision and photophobia.  Respiratory: Negative.  Negative for cough and shortness of breath.   Cardiovascular: Negative.  Negative for chest pain, palpitations and leg swelling.  Gastrointestinal: Negative.  Negative for heartburn, nausea and vomiting.  Musculoskeletal: Positive for joint pain. Negative for myalgias.  Neurological: Negative.  Negative for dizziness, focal weakness, seizures and headaches.  Psychiatric/Behavioral: Negative for suicidal ideas. The patient is nervous/anxious.     Past Medical History:  Diagnosis Date  . Anxiety   . Arthritis    shoulder and knees  . CAD (coronary artery disease)   . DM (diabetes mellitus) ([place])   . HTN (hypertension)   . Hyperlipemia   . Myocardial infarction ([place])   . Sleep apnea     Past Surgical History:  Procedure Laterality Date  . CARDIAC CATHETERIZATION    . COLONOSCOPY    . COLONOSCOPY  09/25/2011   Procedure: COLONOSCOPY;  Surgeon: Winfield Cunas., MD;  Location: [facility];  Service: Endoscopy;  Laterality: N/A;  . CORONARY STENT PLACEMENT     Ostial LAD stent in 2012    . Left knee surgery      Family History  Problem Relation Age of Onset  . Heart attack Mother   . Anesthesia problems Neg Hx   . Hypotension Neg Hx   . Malignant hyperthermia Neg Hx   . Pseudochol deficiency Neg Hx     Social History Reviewed with no changes to be made today.   Outpatient Medications Prior to Visit  Medication Sig Dispense Refill  . acetaminophen-codeine (TYLENOL #3) 300-30 MG tablet Take 1-2 tablets by mouth every 8 (eight) hours as needed for moderate pain. 60 tablet 0  . amLODipine (NORVASC) 10 MG tablet Take 1 tablet (10 mg total) by mouth daily. 90 tablet 3  . aspirin 81 MG tablet Take 1 tablet (81 mg total) by mouth daily. 90 tablet 3  . carvedilol (COREG) 12.5 MG tablet Take 1 tablet (12.5 mg total) by mouth 2 (two) times daily with a meal. 180 tablet 3  . glipiZIDE (GLUCOTROL) 10 MG tablet TAKE 1 TABLET  BY MOUTH TWICE DAILY BEFORE A MEAL. MUST MAKE APPT FOR FURTHER REFILLS 60 tablet 0  . hydrochlorothiazide (HYDRODIURIL) 25 MG tablet Take 1 tablet (25 mg total) by mouth daily. 90 tablet 3  . metFORMIN (GLUCOPHAGE) 1000 MG tablet Take 1 tablet (1,000 mg total) by mouth 2 (two) times daily with a meal. 180 tablet 3  . Multiple Vitamin (MULTIVITAMIN WITH MINERALS) TABS tablet Take 1 tablet by mouth daily.    . nitroGLYCERIN (NITROSTAT) 0.4 MG SL tablet Place 0.4 mg under the tongue every 5 (five) minutes as needed for chest pain.    Marland Kitchen glucose blood (FREESTYLE LITE) test strip Use as instructed once daily (E11.9) 100 each 12  . Lancets (FREESTYLE) lancets Use as instructed once daily (E11.9) 100 each 12  . rosuvastatin (CRESTOR) 20 MG tablet Take 1 tablet (20 mg total) by mouth daily. 90 tablet 3  . famotidine (PEPCID) 20 MG tablet Take 1 tablet (20 mg total) by mouth 2 (two) times daily. (Patient not taking: Reported on 05/11/2018) 30 tablet 0   No facility-administered medications prior to visit.     Allergies  Allergen Reactions  . Benadryl [Diphenhydramine  Hcl (Sleep)] Other (See Comments)    unknown  . Rosuvastatin Hives    Patient is allergic to generic rosuvastatin. She says she is able to take branded Crestor.   . Simvastatin Other (See Comments)    unknown       Objective:    BP 119/75 (BP Location: Left Arm, Patient Position: Sitting, Cuff Size: Normal)   Pulse 82   Temp 98.7 F (37.1 C) (Oral)   Ht 5' 7" (1.702 m)   Wt 163 lb 9.6 oz (74.2 kg)   SpO2 97%   BMI 25.62 kg/m  Wt Readings from Last 3 Encounters:  11/23/18 163 lb 9.6 oz (74.2 kg)  05/11/18 166 lb 12.8 oz (75.7 kg)  12/21/17 165 lb 1.9 oz (74.9 kg)    Physical Exam Vitals signs and nursing note reviewed.  Constitutional:      Appearance: She is well-developed.  HENT:     Head: Normocephalic and atraumatic.  Neck:     Musculoskeletal: Normal range of motion.  Cardiovascular:     Rate and Rhythm: Normal rate and regular rhythm.     Heart sounds: Normal heart sounds. No murmur. No friction rub. No gallop.   Pulmonary:     Effort: Pulmonary effort is normal. No tachypnea or respiratory distress.     Breath sounds: Normal breath sounds. No decreased breath sounds, wheezing, rhonchi or rales.  Chest:     Chest wall: No tenderness.  Abdominal:     General: Bowel sounds are normal.     Palpations: Abdomen is soft.  Musculoskeletal:     Left knee: She exhibits decreased range of motion and swelling. She exhibits normal patellar mobility. Tenderness found. Medial joint line tenderness noted.  Skin:    General: Skin is warm and dry.  Neurological:     Mental Status: She is alert and oriented to person, place, and time.     Coordination: Coordination normal.  Psychiatric:        Attention and Perception: Attention normal.        Mood and Affect: Mood normal.        Speech: Speech normal.        Behavior: Behavior normal. Behavior is cooperative.        Thought Content: Thought content normal.  Cognition and Memory: Cognition normal.        Judgment:  Judgment normal.        Patient has been counseled extensively about nutrition and exercise as well as the importance of adherence with medications and regular follow-up. The patient was given clear instructions to go to ER or return to medical center if symptoms don't improve, worsen or new problems develop. The patient verbalized understanding.   Follow-up: Return for needs physical .   Gildardo Pounds, FNP-BC [facility] [place], [place]   11/24/2018, 12:55 PM

## 2018-11-24 ENCOUNTER — Encounter: Payer: Self-pay | Admitting: Nurse Practitioner

## 2018-11-24 LAB — CMP14+EGFR
ALT: 17 IU/L (ref 0–32)
AST: 18 IU/L (ref 0–40)
Albumin/Globulin Ratio: 2.1 (ref 1.2–2.2)
Albumin: 4.6 g/dL (ref 3.8–4.8)
Alkaline Phosphatase: 80 IU/L (ref 39–117)
BUN/Creatinine Ratio: 12 (ref 12–28)
BUN: 13 mg/dL (ref 8–27)
Bilirubin Total: 0.4 mg/dL (ref 0.0–1.2)
CALCIUM: 10.4 mg/dL — AB (ref 8.7–10.3)
CO2: 25 mmol/L (ref 20–29)
CREATININE: 1.1 mg/dL — AB (ref 0.57–1.00)
Chloride: 102 mmol/L (ref 96–106)
GFR calc Af Amer: 60 mL/min/{1.73_m2} (ref >59)
GFR, EST NON AFRICAN AMERICAN: 52 mL/min/{1.73_m2} — AB (ref >59)
GLUCOSE: 83 mg/dL (ref 65–99)
Globulin, Total: 2.2 g/dL (ref 1.5–4.5)
POTASSIUM: 3.8 mmol/L (ref 3.5–5.2)
Sodium: 143 mmol/L (ref 134–144)
Total Protein: 6.8 g/dL (ref 6.0–8.5)

## 2018-11-25 ENCOUNTER — Telehealth: Payer: Self-pay

## 2018-11-25 NOTE — Telephone Encounter (Signed)
-----   Message from Gildardo Pounds, NP sent at 11/24/2018  2:26 PM EST ----- Kidney function is stable and improved. Will continue to monitor.

## 2018-11-25 NOTE — Telephone Encounter (Signed)
CMA spoke to patient to inform on results.   Pt. Verified DOB.  Pt.understood.

## 2018-12-13 ENCOUNTER — Other Ambulatory Visit: Payer: Self-pay | Admitting: Nurse Practitioner

## 2018-12-13 DIAGNOSIS — E119 Type 2 diabetes mellitus without complications: Secondary | ICD-10-CM

## 2018-12-21 ENCOUNTER — Telehealth: Payer: Self-pay | Admitting: Nurse Practitioner

## 2018-12-21 ENCOUNTER — Ambulatory Visit (HOSPITAL_COMMUNITY)
Admission: RE | Admit: 2018-12-21 | Discharge: 2018-12-21 | Disposition: A | Discharge status: Home / Self Care | Payer: Medicare HMO | Source: Ambulatory Visit | Attending: Nurse Practitioner | Admitting: Nurse Practitioner

## 2018-12-21 ENCOUNTER — Ambulatory Visit: Payer: 59

## 2018-12-21 ENCOUNTER — Ambulatory Visit
Admission: RE | Admit: 2018-12-21 | Discharge: 2018-12-21 | Disposition: A | Discharge status: Home / Self Care | Payer: Medicare HMO | Source: Ambulatory Visit | Attending: Nurse Practitioner | Admitting: Nurse Practitioner

## 2018-12-21 DIAGNOSIS — M25562 Pain in left knee: Secondary | ICD-10-CM | POA: Diagnosis not present

## 2018-12-21 DIAGNOSIS — G8929 Other chronic pain: Secondary | ICD-10-CM | POA: Diagnosis not present

## 2018-12-21 DIAGNOSIS — Z1231 Encounter for screening mammogram for malignant neoplasm of breast: Secondary | ICD-10-CM

## 2018-12-21 DIAGNOSIS — M1712 Unilateral primary osteoarthritis, left knee: Secondary | ICD-10-CM | POA: Diagnosis not present

## 2018-12-21 NOTE — Telephone Encounter (Signed)
1) Medication(s) Requested (by name): acetaminophen 2) Pharmacy of Choice:  [facility] [address] [place]

## 2018-12-22 ENCOUNTER — Other Ambulatory Visit: Payer: Self-pay | Admitting: Nurse Practitioner

## 2018-12-22 DIAGNOSIS — G8929 Other chronic pain: Secondary | ICD-10-CM

## 2018-12-22 DIAGNOSIS — M545 Low back pain: Principal | ICD-10-CM

## 2018-12-22 MED ORDER — ACETAMINOPHEN-CODEINE #3 300-30 MG PO TABS: 1.0000 | ORAL_TABLET | Freq: Three times a day (TID) | ORAL | 0 refills | Status: DC | PRN

## 2018-12-22 NOTE — Telephone Encounter (Signed)
Pt last seen: 11/23/18 Next appt: n/a Last RX written on: 05/11/18 Date of original fill: 05/11/18 Date of refill(s): 10/25/18  No other controlled substances were filled during this time, please refill if appropriate.

## 2018-12-23 ENCOUNTER — Telehealth: Payer: Self-pay

## 2018-12-23 NOTE — Telephone Encounter (Signed)
-----   Message from Gildardo Pounds, NP sent at 12/22/2018 10:01 AM EST ----- Knee xray shows arthritis. This can best be treated with weight loss, antiinflammatories, water aerobics and walking. Wearing a soft knee brace or sleeve when walking may also help relieve pain.

## 2018-12-23 NOTE — Telephone Encounter (Signed)
CMA spoke to patient to inform on results and PCP advising.  Pt. Understood. Pt. Verified DOB.  Pt. Stated she had already tried the options and stated it is not helping her.   Pt asked are there any alternative options.

## 2018-12-26 ENCOUNTER — Other Ambulatory Visit: Payer: Self-pay | Admitting: Nurse Practitioner

## 2018-12-26 DIAGNOSIS — M25562 Pain in left knee: Principal | ICD-10-CM

## 2018-12-26 DIAGNOSIS — G8929 Other chronic pain: Secondary | ICD-10-CM

## 2018-12-26 NOTE — Telephone Encounter (Signed)
Will refer to ortho

## 2018-12-28 NOTE — Telephone Encounter (Signed)
CMA spoke to patient to inform referral has been placed.  Pt. Verified DOB. Pt. Understood.

## 2019-01-04 ENCOUNTER — Other Ambulatory Visit: Payer: Self-pay

## 2019-01-04 ENCOUNTER — Encounter (INDEPENDENT_AMBULATORY_CARE_PROVIDER_SITE_OTHER): Payer: Self-pay | Admitting: Physician Assistant

## 2019-01-04 ENCOUNTER — Ambulatory Visit (INDEPENDENT_AMBULATORY_CARE_PROVIDER_SITE_OTHER): Payer: Medicare HMO | Admitting: Physician Assistant

## 2019-01-04 DIAGNOSIS — M1712 Unilateral primary osteoarthritis, left knee: Secondary | ICD-10-CM
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
## 2019-01-20 ENCOUNTER — Telehealth: Payer: Self-pay | Admitting: Nurse Practitioner

## 2019-01-20 NOTE — Telephone Encounter (Signed)
New Message   Pt states that her accu check meter broke and wants to know what she should do about checking her sugar

## 2019-01-21 ENCOUNTER — Other Ambulatory Visit: Payer: Self-pay | Admitting: Pharmacist

## 2019-01-21 DIAGNOSIS — E119 Type 2 diabetes mellitus without complications: Secondary | ICD-10-CM

## 2019-01-21 MED ORDER — GLUCOSE BLOOD VI STRP: ORAL_STRIP | 11 refills | Status: DC

## 2019-01-21 MED ORDER — ACCU-CHEK GUIDE ME W/DEVICE KIT: 1.0000 | PACK | Freq: Every day | 0 refills | Status: DC

## 2019-01-21 NOTE — Telephone Encounter (Signed)
New meter and test strip sent for patient. CMA notified Patient.

## 2019-01-27 ENCOUNTER — Telehealth: Payer: Self-pay | Admitting: Nurse Practitioner

## 2019-01-27 DIAGNOSIS — E119 Type 2 diabetes mellitus without complications: Secondary | ICD-10-CM

## 2019-01-27 NOTE — Telephone Encounter (Signed)
1) Medication(s) Requested (by name): lancets 2) Pharmacy of Choice: [facility], [place] - [address] AT [place] 3) Special Requests:   Approved medications will be sent to the pharmacy, we will reach out if there is an issue.  Requests made after 3pm may not be addressed until the following business day!  If a patient is unsure of the name of the medication(s) please note and ask patient to call back when they are able to provide all info, do not send to responsible party until all information is available!

## 2019-01-28 MED ORDER — ACCU-CHEK FASTCLIX LANCETS MISC: 12 refills | Status: DC

## 2019-03-05 ENCOUNTER — Other Ambulatory Visit: Payer: Self-pay | Admitting: Nurse Practitioner

## 2019-03-05 DIAGNOSIS — E119 Type 2 diabetes mellitus without complications: Secondary | ICD-10-CM

## 2019-03-09 ENCOUNTER — Other Ambulatory Visit: Payer: Self-pay | Admitting: Nurse Practitioner

## 2019-03-09 DIAGNOSIS — G8929 Other chronic pain: Secondary | ICD-10-CM

## 2019-05-24 ENCOUNTER — Other Ambulatory Visit: Payer: Self-pay | Admitting: Nurse Practitioner

## 2019-05-24 DIAGNOSIS — I10 Essential (primary) hypertension: Secondary | ICD-10-CM

## 2019-05-28 ENCOUNTER — Other Ambulatory Visit: Payer: Self-pay | Admitting: Nurse Practitioner

## 2019-05-28 DIAGNOSIS — I10 Essential (primary) hypertension: Secondary | ICD-10-CM

## 2019-05-31 ENCOUNTER — Telehealth: Payer: Self-pay | Admitting: Nurse Practitioner

## 2019-05-31 DIAGNOSIS — I10 Essential (primary) hypertension: Secondary | ICD-10-CM

## 2019-05-31 NOTE — Telephone Encounter (Signed)
New Message   1) Medication(s) Requested (by name): amLODipine (NORVASC) 10 MG tablet  2) Pharmacy of Choice: [facility]  3) Special Requests:   Approved medications will be sent to the pharmacy, we will reach out if there is an issue.  Requests made after 3pm may not be addressed until the following business day!  If a patient is unsure of the name of the medication(s) please note and ask patient to call back when they are able to provide all info, do not send to responsible party until all information is available!

## 2019-06-01 ENCOUNTER — Other Ambulatory Visit: Payer: Self-pay | Admitting: Nurse Practitioner

## 2019-06-01 DIAGNOSIS — I10 Essential (primary) hypertension: Secondary | ICD-10-CM

## 2019-06-01 MED ORDER — AMLODIPINE BESYLATE 10 MG PO TABS: 10.0000 mg | ORAL_TABLET | Freq: Every day | ORAL | 0 refills | Status: DC

## 2019-06-01 NOTE — Telephone Encounter (Signed)
Pt made appt 07/12/19 please call in heart med to hold over until appt. Call in to [facility]

## 2019-06-01 NOTE — Telephone Encounter (Signed)
CMA sent medication to [facility].  Pt. Is aware of medication.

## 2019-06-04 ENCOUNTER — Other Ambulatory Visit: Payer: Self-pay | Admitting: Nurse Practitioner

## 2019-06-04 DIAGNOSIS — E119 Type 2 diabetes mellitus without complications: Secondary | ICD-10-CM

## 2019-06-06 ENCOUNTER — Other Ambulatory Visit: Payer: Self-pay | Admitting: Family Medicine

## 2019-06-06 DIAGNOSIS — E119 Type 2 diabetes mellitus without complications: Secondary | ICD-10-CM

## 2019-07-01 ENCOUNTER — Encounter: Payer: Self-pay | Admitting: Nurse Practitioner

## 2019-07-01 ENCOUNTER — Ambulatory Visit: Payer: Medicare HMO | Attending: Nurse Practitioner | Admitting: Nurse Practitioner

## 2019-07-01 ENCOUNTER — Other Ambulatory Visit: Payer: Self-pay | Admitting: Nurse Practitioner

## 2019-07-01 ENCOUNTER — Other Ambulatory Visit: Payer: Self-pay

## 2019-07-01 VITALS — Ht 67.0 in | Wt 163.0 lb

## 2019-07-01 DIAGNOSIS — I10 Essential (primary) hypertension: Secondary | ICD-10-CM

## 2019-07-01 DIAGNOSIS — Z79899 Other long term (current) drug therapy: Secondary | ICD-10-CM | POA: Diagnosis not present

## 2019-07-01 DIAGNOSIS — E119 Type 2 diabetes mellitus without complications: Secondary | ICD-10-CM

## 2019-07-01 DIAGNOSIS — E782 Mixed hyperlipidemia: Secondary | ICD-10-CM

## 2019-07-01 DIAGNOSIS — R109 Unspecified abdominal pain: Secondary | ICD-10-CM | POA: Insufficient documentation

## 2019-07-01 LAB — POCT GLYCOSYLATED HEMOGLOBIN (HGB A1C): Hemoglobin A1C: 6.7 % — AB (ref 4.0–5.6)

## 2019-07-01 LAB — GLUCOSE, POCT (MANUAL RESULT ENTRY): POC Glucose: 110 mg/dl — AB (ref 70–99)

## 2019-07-01 MED ORDER — GLIPIZIDE 10 MG PO TABS: 10.0000 mg | ORAL_TABLET | Freq: Every day | ORAL | 1 refills | Status: DC

## 2019-07-01 MED ORDER — METFORMIN HCL 1000 MG PO TABS: 1000.0000 mg | ORAL_TABLET | Freq: Two times a day (BID) | ORAL | 3 refills | Status: DC

## 2019-07-01 MED ORDER — CARVEDILOL 12.5 MG PO TABS: 12.5000 mg | ORAL_TABLET | Freq: Two times a day (BID) | ORAL | 3 refills | Status: DC

## 2019-07-01 MED ORDER — ACCU-CHEK FASTCLIX LANCETS MISC: 12 refills | Status: DC

## 2019-07-01 MED ORDER — AMLODIPINE BESYLATE 10 MG PO TABS: 10.0000 mg | ORAL_TABLET | Freq: Every day | ORAL | 1 refills | Status: DC

## 2019-07-01 MED ORDER — ACCU-CHEK GUIDE VI STRP: ORAL_STRIP | 11 refills | Status: DC

## 2019-07-01 MED ORDER — CRESTOR 20 MG PO TABS: 20.0000 mg | ORAL_TABLET | Freq: Every day | ORAL | 3 refills | Status: DC

## 2019-07-01 MED ORDER — HYDROCHLOROTHIAZIDE 25 MG PO TABS: 25.0000 mg | ORAL_TABLET | Freq: Every day | ORAL | 1 refills | Status: DC

## 2019-07-01 NOTE — Progress Notes (Signed)
Assessment & Plan:  Tammy Graham was seen today for medication refill.  Diagnoses and all orders for this visit:  Type 2 diabetes mellitus without complication, without long-term current use of insulin (HCC) -     Glucose (CBG) -     HgB A1c -     metFORMIN (GLUCOPHAGE) 1000 MG tablet; Take 1 tablet (1,000 mg total) by mouth 2 (two) times daily with a meal. -     glipiZIDE (GLUCOTROL) 10 MG tablet; Take 1 tablet (10 mg total) by mouth daily before breakfast. -     glucose blood (ACCU-CHEK GUIDE) test strip; Use as instructed -     Ambulatory referral to Podiatry -     Microalbumin/Creatinine Ratio, Urine -     Accu-Chek FastClix Lancets MISC; Use as directed once daily -     Ambulatory referral to Ophthalmology Continue blood sugar control as discussed in office today, low carbohydrate diet, and regular physical exercise as tolerated, 150 minutes per week (30 min each day, 5 days per week, or 50 min 3 days per week). Keep blood sugar logs with fasting goal of 90-130 mg/dl, post prandial (after you eat) less than 180.  For Hypoglycemia: BS <60 and Hyperglycemia BS >400; contact the clinic ASAP. Annual eye exams and foot exams are recommended.   Essential hypertension -     hydrochlorothiazide (HYDRODIURIL) 25 MG tablet; Take 1 tablet (25 mg total) by mouth daily. -     carvedilol (COREG) 12.5 MG tablet; Take 1 tablet (12.5 mg total) by mouth 2 (two) times daily with a meal. -     amLODipine (NORVASC) 10 MG tablet; Take 1 tablet (10 mg total) by mouth daily. -     CBC -     Basic Metabolic Panel Continue all antihypertensives as prescribed.  Remember to bring in your blood pressure log with you for your follow up appointment.  DASH/Mediterranean Diets are healthier choices for HTN.    Mixed hyperlipidemia -     CRESTOR 20 MG tablet; Take 1 tablet (20 mg total) by mouth daily. -     Lipid panel INSTRUCTIONS: Work on a low fat, heart healthy diet and participate in regular aerobic  exercise program by working out at least 150 minutes per week; 5 days a week-30 minutes per day. Avoid red meat, fried foods. junk foods, sodas, sugary drinks, unhealthy snacking, alcohol and smoking.  Drink at least 48oz of water per day and monitor your carbohydrate intake daily.    Controlled substance agreement signed -     Drug Screen 12+Alcohol+CRT, Ur Taking tylenol # 3 sparingly for chronic left knee pain.  She has OA of the left hip and knee pain. Knee pain is aggravated by walking and weight bearing. Other symptoms include swelling and crepitus. Relieving symptoms; Pain is described as intense aching.   Patient has been counseled on age-appropriate routine health concerns for screening and prevention. These are reviewed and up-to-date. Referrals have been placed accordingly. Immunizations are up-to-date or declined.    Subjective:   Chief Complaint  Patient presents with  . Medication Refill   HPI Tammy Graham 68 y.o. female presents to office today for follow up.  has a past medical history of Anxiety, Arthritis, CAD (coronary artery disease), DM (diabetes mellitus) ([place]), HTN (hypertension), Hyperlipemia, Myocardial infarction [facility]), and Sleep apnea.    DM TYPE 2 Well controlled.  Currently taking glipizide and metformin 1000 mg twice daily as prescribed.  She denies any  hypo-or hyperglycemic symptoms. Urine micro pending.  She is compliant with her medications however not compliant with diet, exercise or frequent monitoring of her blood glucose levels at home.  She is overdue for eye exam and referral will be placed today. Lab Results  Component Value Date   HGBA1C 6.7 (A) 07/01/2019    CHRONIC HYPERTENSION  BP Readings from Last 3 Encounters:  11/23/18 119/75  05/11/18 119/75  12/21/17 116/70  Chronic and well controlled.  Currently taking amlodipine 10 mg daily, Coreg 12.5 mg twice daily, hydrochlorothiazide 25 mg daily as prescribed.  She does not monitor at home  as she does not own a BP monitor.   Chest pain: no   Dyspnea: no   Claudication: no  Medication compliance: yes  Medication Side Effects  Lightheadedness: no   Urinary frequency: no   Edema: no   Impotence: no  Preventitive Healthcare:  Exercise: no   Diet Pattern: diet: general  Salt Restriction:  no   Hyperlipidemia Patient presents for follow up to hyperlipidemia.  She is medication compliant taking Crestor 20 mg daily as prescribed. She is not consistently diet compliant.  She denies any statin intolerance.  LDL not at goal of less than 70. Lab Results  Component Value Date   CHOL 191 05/11/2018   Lab Results  Component Value Date   HDL 45 05/11/2018   Lab Results  Component Value Date   LDLCALC 110 (H) 05/11/2018   Lab Results  Component Value Date   TRIG 181 (H) 05/11/2018   Lab Results  Component Value Date   CHOLHDL 4.2 05/11/2018  Review of Systems  Constitutional: Negative for fever, malaise/fatigue and weight loss.  HENT: Negative.  Negative for nosebleeds.   Eyes: Negative.  Negative for blurred vision, double vision and photophobia.  Respiratory: Negative.  Negative for cough and shortness of breath.   Cardiovascular: Negative.  Negative for chest pain, palpitations and leg swelling.  Gastrointestinal: Negative.  Negative for heartburn, nausea and vomiting.  Musculoskeletal: Positive for joint pain (chronic left knee pain). Negative for myalgias.  Neurological: Negative.  Negative for dizziness, focal weakness, seizures and headaches.  Psychiatric/Behavioral: Negative.  Negative for suicidal ideas.    Past Medical History:  Diagnosis Date  . Anxiety   . Arthritis    shoulder and knees  . CAD (coronary artery disease)   . DM (diabetes mellitus) ([place])   . HTN (hypertension)   . Hyperlipemia   . Myocardial infarction ([place])   . Sleep apnea     Past Surgical History:  Procedure Laterality Date  . CARDIAC CATHETERIZATION    . COLONOSCOPY    .  COLONOSCOPY  09/25/2011   Procedure: COLONOSCOPY;  Surgeon: Winfield Cunas., MD;  Location: [facility];  Service: Endoscopy;  Laterality: N/A;  . CORONARY STENT PLACEMENT     Ostial LAD stent in 2012  . Left knee surgery      Family History  Problem Relation Age of Onset  . Heart attack Mother   . Anesthesia problems Neg Hx   . Hypotension Neg Hx   . Malignant hyperthermia Neg Hx   . Pseudochol deficiency Neg Hx     Social History Reviewed with no changes to be made today.   Outpatient Medications Prior to Visit  Medication Sig Dispense Refill  . acetaminophen-codeine (TYLENOL #3) 300-30 MG tablet TAKE 1 TO 2 TABLETS BY MOUTH EVERY 8 HOURS AS NEEDED FOR MODERATE PAIN 60 tablet 0  . aspirin 81  MG tablet Take 1 tablet (81 mg total) by mouth daily. 90 tablet 3  . Blood Glucose Monitoring Suppl (ACCU-CHEK GUIDE ME) w/Device KIT 1 kit by Does not apply route daily. Use to check blood sugar daily. 1 kit 0  . Multiple Vitamin (MULTIVITAMIN WITH MINERALS) TABS tablet Take 1 tablet by mouth daily.    . Accu-Chek FastClix Lancets MISC Use as directed once daily 102 each 12  . amLODipine (NORVASC) 10 MG tablet Take 1 tablet (10 mg total) by mouth daily. 30 tablet 0  . carvedilol (COREG) 12.5 MG tablet Take 1 tablet (12.5 mg total) by mouth 2 (two) times daily with a meal. 180 tablet 3  . CRESTOR 20 MG tablet Take 1 tablet (20 mg total) by mouth daily. 90 tablet 3  . glipiZIDE (GLUCOTROL) 10 MG tablet TAKE 1 TABLET BY MOUTH TWICE DAILY BEFORE A MEAL 60 tablet 0  . glucose blood (ACCU-CHEK GUIDE) test strip Use as instructed 100 each 11  . hydrochlorothiazide (HYDRODIURIL) 25 MG tablet Take 1 tablet (25 mg total) by mouth daily. 90 tablet 3  . metFORMIN (GLUCOPHAGE) 1000 MG tablet Take 1 tablet (1,000 mg total) by mouth 2 (two) times daily with a meal. 180 tablet 3  . famotidine (PEPCID) 20 MG tablet Take 1 tablet (20 mg total) by mouth 2 (two) times daily. (Patient not taking: Reported on  05/11/2018) 30 tablet 0  . nitroGLYCERIN (NITROSTAT) 0.4 MG SL tablet Place 0.4 mg under the tongue every 5 (five) minutes as needed for chest pain.     No facility-administered medications prior to visit.     Allergies  Allergen Reactions  . Benadryl [Diphenhydramine Hcl (Sleep)] Other (See Comments)    unknown  . Rosuvastatin Hives    Patient is allergic to generic rosuvastatin. She says she is able to take branded Crestor.   . Simvastatin Other (See Comments)    unknown       Objective:    Ht 5' 7"  (1.702 m)   Wt 163 lb (73.9 kg)   BMI 25.53 kg/m  Wt Readings from Last 3 Encounters:  07/01/19 163 lb (73.9 kg)  11/23/18 163 lb 9.6 oz (74.2 kg)  05/11/18 166 lb 12.8 oz (75.7 kg)    Physical Exam       Patient has been counseled extensively about nutrition and exercise as well as the importance of adherence with medications and regular follow-up. The patient was given clear instructions to go to ER or return to medical center if symptoms don't improve, worsen or new problems develop. The patient verbalized understanding.   Follow-up: Return in about 3 months (around 09/30/2019) for DM HTN HPL.   Gildardo Pounds, FNP-BC [facility] and [place], [place]   07/01/2019, 6:18 PM

## 2019-07-03 ENCOUNTER — Other Ambulatory Visit: Payer: Self-pay | Admitting: Nurse Practitioner

## 2019-07-03 DIAGNOSIS — G8929 Other chronic pain: Secondary | ICD-10-CM

## 2019-07-03 LAB — CBC
Hematocrit: 46.5 % (ref 34.0–46.6)
Hemoglobin: 15.3 g/dL (ref 11.1–15.9)
MCH: 32.3 pg (ref 26.6–33.0)
MCHC: 32.9 g/dL (ref 31.5–35.7)
MCV: 98 fL — ABNORMAL HIGH (ref 79–97)
Platelets: 257 10*3/uL (ref 150–450)
RBC: 4.73 x10E6/uL (ref 3.77–5.28)
RDW: 12.6 % (ref 11.7–15.4)
WBC: 7.4 10*3/uL (ref 3.4–10.8)

## 2019-07-03 LAB — LIPID PANEL
Chol/HDL Ratio: 3.9 ratio (ref 0.0–4.4)
Cholesterol, Total: 184 mg/dL (ref 100–199)
HDL: 47 mg/dL (ref >39)
LDL Chol Calc (NIH): 102 mg/dL — ABNORMAL HIGH (ref 0–99)
Triglycerides: 206 mg/dL — ABNORMAL HIGH (ref 0–149)
VLDL Cholesterol Cal: 35 mg/dL (ref 5–40)

## 2019-07-03 LAB — BASIC METABOLIC PANEL
BUN/Creatinine Ratio: 12 (ref 12–28)
BUN: 12 mg/dL (ref 8–27)
CO2: 27 mmol/L (ref 20–29)
Calcium: 10.5 mg/dL — ABNORMAL HIGH (ref 8.7–10.3)
Chloride: 101 mmol/L (ref 96–106)
Creatinine, Ser: 1 mg/dL (ref 0.57–1.00)
GFR calc Af Amer: 67 mL/min/{1.73_m2} (ref >59)
GFR calc non Af Amer: 58 mL/min/{1.73_m2} — ABNORMAL LOW (ref >59)
Glucose: 90 mg/dL (ref 65–99)
Potassium: 4 mmol/L (ref 3.5–5.2)
Sodium: 142 mmol/L (ref 134–144)

## 2019-07-03 LAB — MICROALBUMIN / CREATININE URINE RATIO
Creatinine, Urine: 220 mg/dL
Microalb/Creat Ratio: 13 mg/g creat (ref 0–29)
Microalbumin, Urine: 28.3 ug/mL

## 2019-07-05 ENCOUNTER — Ambulatory Visit (INDEPENDENT_AMBULATORY_CARE_PROVIDER_SITE_OTHER): Payer: Medicare HMO | Admitting: Podiatry

## 2019-07-05 ENCOUNTER — Other Ambulatory Visit: Payer: Self-pay

## 2019-07-05 ENCOUNTER — Encounter: Payer: Self-pay | Admitting: Podiatry

## 2019-07-05 DIAGNOSIS — E119 Type 2 diabetes mellitus without complications: Secondary | ICD-10-CM

## 2019-07-05 DIAGNOSIS — M79675 Pain in left toe(s): Secondary | ICD-10-CM

## 2019-07-05 DIAGNOSIS — B351 Tinea unguium: Secondary | ICD-10-CM

## 2019-07-05 DIAGNOSIS — M79674 Pain in right toe(s): Secondary | ICD-10-CM | POA: Diagnosis not present

## 2019-07-05 LAB — DRUG SCREEN 12+ALCOHOL+CRT, UR
Amphetamines, Urine: NEGATIVE ng/mL
BENZODIAZ UR QL: NEGATIVE ng/mL
Barbiturate: NEGATIVE ng/mL
Cannabinoids: NEGATIVE ng/mL
Cocaine (Metabolite): NEGATIVE ng/mL
Creatinine, Urine: 205.9 mg/dL (ref 20.0–300.0)
Ethanol, Urine: NEGATIVE %
Meperidine: NEGATIVE ng/mL
Methadone: NEGATIVE ng/mL
OPIATE SCREEN URINE: POSITIVE ng/mL — AB
Oxycodone/Oxymorphone, Urine: NEGATIVE ng/mL
Phencyclidine: NEGATIVE ng/mL
Propoxyphene: NEGATIVE ng/mL
Tramadol: NEGATIVE ng/mL

## 2019-07-05 NOTE — Progress Notes (Signed)
This patient presents to the office with chief complaint of long thick nails and diabetic feet.  This patient  says there  is  no pain and discomfort in her  feet.  This patient says there are long thick painful nails especially her right big toenail. These nails are painful walking and wearing shoes.  Patient has no history of infection or drainage from both feet.  Patient is unable to  self treat his own nails . This patient presents  to the office today for treatment of the  long nails and a foot evaluation due to history of  diabetes.  General Appearance  Alert, conversant and in no acute stress.  Vascular  Dorsalis pedis and posterior tibial  pulses are palpable  bilaterally.  Capillary return is within normal limits  bilaterally. Temperature is within normal limits  bilaterally.  Neurologic  Senn-Weinstein monofilament wire test within normal limits  bilaterally. Muscle power within normal limits bilaterally.  Nails Thick disfigured discolored nails with subungual debris  from hallux to fifth toes bilaterally. No evidence of bacterial infection or drainage bilaterally. Pincer toenails  B/L  Orthopedic  No limitations of motion of motion feet .  No crepitus or effusions noted.  No bony pathology or digital deformities noted.  Skin  normotropic skin with no porokeratosis noted bilaterally.  No signs of infections or ulcers noted.     Onychomycosis  Diabetes with no foot complications  IE  Debride nails x 10.  A diabetic foot exam was performed and there is no evidence of any vascular or neurologic pathology.   RTC 3 months.   Gardiner Barefoot DPM

## 2019-07-12 ENCOUNTER — Ambulatory Visit: Payer: Medicare HMO | Admitting: Nurse Practitioner

## 2019-09-06 DIAGNOSIS — E119 Type 2 diabetes mellitus without complications: Secondary | ICD-10-CM | POA: Diagnosis not present

## 2019-09-06 DIAGNOSIS — H40033 Anatomical narrow angle, bilateral: Secondary | ICD-10-CM | POA: Diagnosis not present

## 2019-09-06 DIAGNOSIS — H2513 Age-related nuclear cataract, bilateral: Secondary | ICD-10-CM | POA: Diagnosis not present

## 2019-09-06 LAB — HM DIABETES EYE EXAM

## 2019-10-04 ENCOUNTER — Ambulatory Visit: Payer: Medicare HMO | Admitting: Podiatry

## 2019-11-03 ENCOUNTER — Other Ambulatory Visit: Payer: Self-pay | Admitting: Nurse Practitioner

## 2019-11-03 DIAGNOSIS — G8929 Other chronic pain: Secondary | ICD-10-CM

## 2019-11-17 ENCOUNTER — Telehealth: Payer: Self-pay

## 2019-11-17 NOTE — Telephone Encounter (Signed)
PA for Tylenol #3 denied thru ins 11/15/19

## 2019-12-04 ENCOUNTER — Other Ambulatory Visit: Payer: Self-pay | Admitting: Nurse Practitioner

## 2019-12-04 DIAGNOSIS — E119 Type 2 diabetes mellitus without complications: Secondary | ICD-10-CM

## 2019-12-16 ENCOUNTER — Telehealth: Payer: Self-pay | Admitting: Nurse Practitioner

## 2019-12-16 NOTE — Telephone Encounter (Signed)
Attempt to reach patient to inform she will need to call the [facility] in [place] to get it transfer to the pharmacy here in [place].  No answer and LVM.

## 2019-12-16 NOTE — Telephone Encounter (Signed)
Pt called stated she has been out of her medicine for 2 weeks and would like it sent to the [facility] [address] [facility] in [place]. Stated sugar count has been high  glipiZIDE (Tab) GLUCOTROL 10 MG

## 2019-12-22 NOTE — Telephone Encounter (Signed)
error 

## 2019-12-24 ENCOUNTER — Other Ambulatory Visit: Payer: Self-pay | Admitting: Nurse Practitioner

## 2019-12-24 DIAGNOSIS — I10 Essential (primary) hypertension: Secondary | ICD-10-CM

## 2020-01-02 ENCOUNTER — Other Ambulatory Visit: Payer: Self-pay | Admitting: Family Medicine

## 2020-01-02 ENCOUNTER — Telehealth: Payer: Self-pay | Admitting: Nurse Practitioner

## 2020-01-02 DIAGNOSIS — I10 Essential (primary) hypertension: Secondary | ICD-10-CM

## 2020-01-02 MED ORDER — AMLODIPINE BESYLATE 10 MG PO TABS: 10.0000 mg | ORAL_TABLET | Freq: Every day | ORAL | 0 refills | Status: DC

## 2020-01-02 NOTE — Telephone Encounter (Signed)
Rx sent 

## 2020-01-02 NOTE — Telephone Encounter (Signed)
1) Medication(s) Requested (by name): amLODipine (NORVASC) 10 MG tablet   2) [facility] [place] - [address] AT [address]  [place], [place]   3) Special Requests: Patient states that it's very important for her to have her medication.    Approved medications will be sent to the pharmacy, we will reach out if there is an issue.  Requests made after 3pm may not be addressed until the following business day!  If a patient is unsure of the name of the medication(s) please note and ask patient to call back when they are able to provide all info, do not send to responsible party until all information is available!

## 2020-01-03 ENCOUNTER — Ambulatory Visit: Payer: Medicare Other | Attending: Family Medicine | Admitting: Family Medicine

## 2020-01-03 ENCOUNTER — Telehealth: Payer: Self-pay | Admitting: Family Medicine

## 2020-01-03 ENCOUNTER — Other Ambulatory Visit: Payer: Self-pay

## 2020-01-03 ENCOUNTER — Encounter: Payer: Self-pay | Admitting: Family Medicine

## 2020-01-03 VITALS — BP 124/69 | HR 73 | Ht 67.0 in | Wt 164.2 lb

## 2020-01-03 DIAGNOSIS — E119 Type 2 diabetes mellitus without complications: Secondary | ICD-10-CM

## 2020-01-03 DIAGNOSIS — E782 Mixed hyperlipidemia: Secondary | ICD-10-CM | POA: Diagnosis not present

## 2020-01-03 DIAGNOSIS — F17210 Nicotine dependence, cigarettes, uncomplicated: Secondary | ICD-10-CM | POA: Diagnosis not present

## 2020-01-03 DIAGNOSIS — Z720 Tobacco use: Secondary | ICD-10-CM

## 2020-01-03 DIAGNOSIS — I10 Essential (primary) hypertension: Secondary | ICD-10-CM

## 2020-01-03 LAB — GLUCOSE, POCT (MANUAL RESULT ENTRY): POC Glucose: 114 mg/dl — AB (ref 70–99)

## 2020-01-03 LAB — POCT GLYCOSYLATED HEMOGLOBIN (HGB A1C): HbA1c, POC (controlled diabetic range): 6.8 % (ref 0.0–7.0)

## 2020-01-03 MED ORDER — HYDROCHLOROTHIAZIDE 25 MG PO TABS: 25.0000 mg | ORAL_TABLET | Freq: Every day | ORAL | 1 refills | Status: DC

## 2020-01-03 MED ORDER — ACCU-CHEK GUIDE VI STRP: ORAL_STRIP | 11 refills | Status: DC

## 2020-01-03 MED ORDER — ACCU-CHEK AVIVA PLUS VI STRP: ORAL_STRIP | 12 refills | Status: DC

## 2020-01-03 MED ORDER — CRESTOR 20 MG PO TABS: 20.0000 mg | ORAL_TABLET | Freq: Every day | ORAL | 1 refills | Status: DC

## 2020-01-03 MED ORDER — AMLODIPINE BESYLATE 10 MG PO TABS: 10.0000 mg | ORAL_TABLET | Freq: Every day | ORAL | 1 refills | Status: DC

## 2020-01-03 MED ORDER — CARVEDILOL 12.5 MG PO TABS: 12.5000 mg | ORAL_TABLET | Freq: Two times a day (BID) | ORAL | 1 refills | Status: DC

## 2020-01-03 MED ORDER — GLIPIZIDE 10 MG PO TABS: 10.0000 mg | ORAL_TABLET | Freq: Every day | ORAL | 1 refills | Status: DC

## 2020-01-03 MED ORDER — BUPROPION HCL ER (XL) 150 MG PO TB24: 150.0000 mg | ORAL_TABLET | Freq: Every day | ORAL | 1 refills | Status: DC

## 2020-01-03 NOTE — Telephone Encounter (Signed)
done

## 2020-01-03 NOTE — Telephone Encounter (Signed)
Patients pharmacy called and requested for the patient get a script for Accuchek Aviva strips due to the meter the patient has. Please follow up at your earliest convenience.

## 2020-01-03 NOTE — Progress Notes (Signed)
Needs refills on medications. 

## 2020-01-03 NOTE — Patient Instructions (Signed)

## 2020-01-03 NOTE — Progress Notes (Signed)
Subjective:  Patient ID: Tammy Graham, female    DOB: Apr 29, 1951  Age: 69 y.o. MRN: [id number]  CC: Diabetes   HPI Tammy Graham is a 69 year old female with a history of coronary artery disease (status post DES), type 2 diabetes mellitus (A1c 6.8), hypertension, tobacco abuse who presents today for chronic disease management. She is doing well on her antihypertensive and diabetic medications but has been out for about 3 weeks prior to today but recently received refills. Denies presence of hypoglycemia, numbness in extremities, visual concerns. She denies presence of chest pain, dyspnea or pedal edema. She has no additional concerns today. Currently actively smoking.  Past Medical History:  Diagnosis Date  . Anxiety   . Arthritis    shoulder and knees  . CAD (coronary artery disease)   . DM (diabetes mellitus) ([place])   . HTN (hypertension)   . Hyperlipemia   . Myocardial infarction ([place])   . Sleep apnea     Past Surgical History:  Procedure Laterality Date  . CARDIAC CATHETERIZATION    . COLONOSCOPY    . COLONOSCOPY  09/25/2011   Procedure: COLONOSCOPY;  Surgeon: Winfield Cunas., MD;  Location: [facility];  Service: Endoscopy;  Laterality: N/A;  . CORONARY STENT PLACEMENT     Ostial LAD stent in 2012  . Left knee surgery      Family History  Problem Relation Age of Onset  . Heart attack Mother   . Anesthesia problems Neg Hx   . Hypotension Neg Hx   . Malignant hyperthermia Neg Hx   . Pseudochol deficiency Neg Hx     Allergies  Allergen Reactions  . Benadryl [Diphenhydramine Hcl (Sleep)] Other (See Comments)    unknown  . Rosuvastatin Hives    Patient is allergic to generic rosuvastatin. She says she is able to take branded Crestor.   . Simvastatin Other (See Comments)    unknown    Outpatient Medications Prior to Visit  Medication Sig Dispense Refill  . Accu-Chek FastClix Lancets MISC Use as directed once daily 100 each 12  . amLODipine  (NORVASC) 10 MG tablet Take 1 tablet (10 mg total) by mouth daily. Must keep upcoming office visit for refills. 30 tablet 0  . aspirin 81 MG tablet Take 1 tablet (81 mg total) by mouth daily. 90 tablet 3  . Blood Glucose Monitoring Suppl (ACCU-CHEK GUIDE ME) w/Device KIT 1 kit by Does not apply route daily. Use to check blood sugar daily. 1 kit 0  . carvedilol (COREG) 12.5 MG tablet Take 1 tablet (12.5 mg total) by mouth 2 (two) times daily with a meal. 180 tablet 3  . glipiZIDE (GLUCOTROL) 10 MG tablet Take 1 tablet (10 mg total) by mouth daily before breakfast. Must have office visit for refills 30 tablet 0  . hydrochlorothiazide (HYDRODIURIL) 25 MG tablet Take 1 tablet (25 mg total) by mouth daily. 90 tablet 1  . metFORMIN (GLUCOPHAGE) 1000 MG tablet Take 1 tablet (1,000 mg total) by mouth 2 (two) times daily with a meal. 180 tablet 3  . Multiple Vitamin (MULTIVITAMIN WITH MINERALS) TABS tablet Take 1 tablet by mouth daily.    . nitroGLYCERIN (NITROSTAT) 0.4 MG SL tablet Place 0.4 mg under the tongue every 5 (five) minutes as needed for chest pain.    . famotidine (PEPCID) 20 MG tablet Take 1 tablet (20 mg total) by mouth 2 (two) times daily. 30 tablet 0  . glucose blood (ACCU-CHEK GUIDE) test strip Use  as instructed 100 each 11  . CRESTOR 20 MG tablet Take 1 tablet (20 mg total) by mouth daily. 90 tablet 3   No facility-administered medications prior to visit.     ROS Review of Systems  Constitutional: Negative for activity change, appetite change and fatigue.  HENT: Negative for congestion, sinus pressure and sore throat.   Eyes: Negative for visual disturbance.  Respiratory: Negative for cough, chest tightness, shortness of breath and wheezing.   Cardiovascular: Negative for chest pain and palpitations.  Gastrointestinal: Negative for abdominal distention, abdominal pain and constipation.  Endocrine: Negative for polydipsia.  Genitourinary: Negative for dysuria and frequency.    Musculoskeletal: Negative for arthralgias and back pain.  Skin: Negative for rash.  Neurological: Negative for tremors, light-headedness and numbness.  Hematological: Does not bruise/bleed easily.  Psychiatric/Behavioral: Negative for agitation and behavioral problems.    Objective:  BP 124/69   Pulse 73   Ht 5' 7"  (1.702 m)   Wt 164 lb 3.2 oz (74.5 kg)   SpO2 99%   BMI 25.72 kg/m   BP/Weight 01/03/2020 07/05/2019 2/87/8676  Systolic BP 720 947 -  Diastolic BP 69 92 -  Wt. (Lbs) 164.2 - 163  BMI 25.72 - 25.53      Physical Exam Constitutional:      Appearance: She is well-developed.  Neck:     Vascular: No JVD.  Cardiovascular:     Rate and Rhythm: Normal rate.     Heart sounds: Normal heart sounds. No murmur.  Pulmonary:     Effort: Pulmonary effort is normal.     Breath sounds: Normal breath sounds. No wheezing or rales.  Chest:     Chest wall: No tenderness.  Abdominal:     General: Bowel sounds are normal. There is no distension.     Palpations: Abdomen is soft. There is no mass.     Tenderness: There is no abdominal tenderness.  Musculoskeletal:        General: Normal range of motion.     Right lower leg: No edema.     Left lower leg: No edema.  Skin:    Comments: Corn in sole of right foot  Neurological:     Mental Status: She is alert and oriented to person, place, and time.  Psychiatric:        Mood and Affect: Mood normal.     CMP Latest Ref Rng & Units 07/01/2019 11/23/2018 05/11/2018  Glucose 65 - 99 mg/dL 90 83 79  BUN 8 - 27 mg/dL 12 13 15   Creatinine 0.57 - 1.00 mg/dL 1.00 1.10(H) 1.15(H)  Sodium 134 - 144 mmol/L 142 143 141  Potassium 3.5 - 5.2 mmol/L 4.0 3.8 4.2  Chloride 96 - 106 mmol/L 101 102 100  CO2 20 - 29 mmol/L 27 25 25   Calcium 8.7 - 10.3 mg/dL 10.5(H) 10.4(H) 11.1(H)  Total Protein 6.0 - 8.5 g/dL - 6.8 7.4  Total Bilirubin 0.0 - 1.2 mg/dL - 0.4 0.5  Alkaline Phos 39 - 117 IU/L - 80 76  AST 0 - 40 IU/L - 18 17  ALT 0 - 32 IU/L -  17 14    Lipid Panel     Component Value Date/Time   CHOL 184 07/01/2019 1550   TRIG 206 (H) 07/01/2019 1550   HDL 47 07/01/2019 1550   CHOLHDL 3.9 07/01/2019 1550   CHOLHDL 3.1 05/04/2014 0919   VLDL 18 05/04/2014 0919   LDLCALC 102 (H) 07/01/2019 1550    CBC  Component Value Date/Time   WBC 7.4 07/01/2019 1550   WBC 8.2 09/11/2016 2316   RBC 4.73 07/01/2019 1550   RBC 4.48 09/11/2016 2316   HGB 15.3 07/01/2019 1550   HCT 46.5 07/01/2019 1550   PLT 257 07/01/2019 1550   MCV 98 (H) 07/01/2019 1550   MCH 32.3 07/01/2019 1550   MCH 32.8 09/11/2016 2316   MCHC 32.9 07/01/2019 1550   MCHC 34.9 09/11/2016 2316   RDW 12.6 07/01/2019 1550   LYMPHSABS 2.9 09/11/2016 2316   MONOABS 0.4 09/11/2016 2316   EOSABS 0.2 09/11/2016 2316   BASOSABS 0.0 09/11/2016 2316    Lab Results  Component Value Date   HGBA1C 6.8 01/03/2020    Assessment & Plan:  1. Type 2 diabetes mellitus without complication, without long-term current use of insulin (HCC) Controlled with A1c of 6.3 No regimen change today - glucose blood (ACCU-CHEK GUIDE) test strip; Use as instructed  Dispense: 100 each; Refill: 11 - POCT glucose (manual entry) - POCT glycosylated hemoglobin (Hb A1C)  2. Essential hypertension Controlled Continue current regimen Counseled on blood pressure goal of less than 130/80, low-sodium, DASH diet, medication compliance, 150 minutes of moderate intensity exercise per week. Discussed medication compliance, adverse effects.   3. Mixed hyperlipidemia LDL of 102 which is slightly above goal of less than 100 Continue current dose of statin, low-cholesterol diet No regimen change today  4.  Tobacco use Spent 3 minutes counseling on cessation Placed on Wellbutrin  Return in about 1 month (around 02/03/2020) for medicare wellness.  Meds ordered this encounter  Medications  . glucose blood (ACCU-CHEK GUIDE) test strip    Sig: Use as instructed    Dispense:  100 each     Refill:  11    Follow-up: Return in about 1 month (around 02/03/2020) for medicare wellness.       Charlott Rakes, MD, FAAFP. [facility] and [place], [place]   01/03/2020, 11:47 AM

## 2020-02-14 ENCOUNTER — Telehealth: Payer: Self-pay

## 2020-02-14 NOTE — Telephone Encounter (Signed)
Was the Crestor sent DAW 1 by accident?  Pt's ins prefers generic, can we change to DAW 0?

## 2020-02-14 NOTE — Telephone Encounter (Signed)
I will initiate a prior auth and see what they say-just wanted to make sure before I did, thanks!

## 2020-02-14 NOTE — Telephone Encounter (Signed)
Patient has allergy to generic rosuvastatin but not to brand Crestor. Are we able to start a PA for brand Crestor?

## 2020-02-15 ENCOUNTER — Telehealth: Payer: Self-pay

## 2020-02-15 NOTE — Telephone Encounter (Signed)
Pt's brand Crestor prior auth was denied on pt's ins.  She has to have had a history of trial and documented failure of Atorvastatin.  If appropriate can you change therapy to Atorvastatin?

## 2020-02-15 NOTE — Telephone Encounter (Signed)
Can we try an appeal? I spoke to [facility] about this -  LDL is 102 which is above goal of less than 70 and she does have CAD so it does not make Clinical sense to go back to atorvastatin. Thanks.

## 2020-02-16 ENCOUNTER — Other Ambulatory Visit: Payer: Self-pay | Admitting: Family Medicine

## 2020-02-16 DIAGNOSIS — Z1231 Encounter for screening mammogram for malignant neoplasm of breast: Secondary | ICD-10-CM

## 2020-02-21 ENCOUNTER — Ambulatory Visit: Payer: Medicare Other | Attending: Family Medicine | Admitting: Family Medicine

## 2020-02-21 ENCOUNTER — Other Ambulatory Visit: Payer: Self-pay

## 2020-02-21 ENCOUNTER — Encounter: Payer: Self-pay | Admitting: Family Medicine

## 2020-02-21 VITALS — BP 122/67 | HR 77 | Ht 67.0 in | Wt 162.0 lb

## 2020-02-21 DIAGNOSIS — Z720 Tobacco use: Secondary | ICD-10-CM | POA: Diagnosis not present

## 2020-02-21 DIAGNOSIS — E1142 Type 2 diabetes mellitus with diabetic polyneuropathy: Secondary | ICD-10-CM | POA: Diagnosis not present

## 2020-02-21 DIAGNOSIS — Z0000 Encounter for general adult medical examination without abnormal findings: Secondary | ICD-10-CM

## 2020-02-21 LAB — GLUCOSE, POCT (MANUAL RESULT ENTRY): POC Glucose: 132 mg/dl — AB (ref 70–99)

## 2020-02-21 MED ORDER — ACETAMINOPHEN-CODEINE #3 300-30 MG PO TABS: 1.0000 | ORAL_TABLET | Freq: Two times a day (BID) | ORAL | 0 refills | Status: DC | PRN

## 2020-02-21 MED ORDER — GABAPENTIN 300 MG PO CAPS: 300.0000 mg | ORAL_CAPSULE | Freq: Every day | ORAL | 3 refills | Status: DC

## 2020-02-21 NOTE — Patient Instructions (Signed)
  Tammy Graham , Thank you for taking time to come for your Medicare Wellness Visit. I appreciate your ongoing commitment to your health goals. Please review the following plan we discussed and let me know if I can assist you in the future.   These are the goals we discussed: Goals   None     This is a list of the screening recommended for you and due dates:  Health Maintenance  Topic Date Due  . Complete foot exam   Never done  . COVID-19 Vaccine (1) Never done  . Tetanus Vaccine  06/30/2020*  .  Hepatitis C: One time screening is recommended by Center for Disease Control  (CDC) for  adults born from [birth year] through 1965.   06/30/2020*  . Pneumonia vaccines (1 of 2 - PCV13) 06/30/2020*  . Flu Shot  05/20/2020  . Urine Protein Check  06/30/2020  . Hemoglobin A1C  07/05/2020  . Eye exam for diabetics  09/05/2020  . Mammogram  12/20/2020  . Colon Cancer Screening  09/24/2021  . DEXA scan (bone density measurement)  Completed  *Topic was postponed. The date shown is not the original due date.

## 2020-02-21 NOTE — Progress Notes (Signed)
Subjective:   Tammy Graham is a 69 y.o. female who presents for Medicare Annual (Subsequent) preventive examination. She requests a refill for Tylenol 3 which she takes intermittently for knee pain.  Review of Systems:  General: negative for fever, weight loss, appetite change Eyes: no visual symptoms. ENT: no ear symptoms, no sinus tenderness, no nasal congestion or sore throat. Neck: no pain  Respiratory: no wheezing, shortness of breath, cough Cardiovascular: no chest pain, no dyspnea on exertion, no pedal edema, no orthopnea. Gastrointestinal: no abdominal pain, no diarrhea, no constipation Genito-Urinary: no urinary frequency, no dysuria, no polyuria. Hematologic: no bruising Endocrine: no cold or heat intolerance Neurological: + for numbness in feet Musculoskeletal: see HPI Skin: no pruritus, no rash. Psychological: no depression, no anxiety,          Objective:     Vitals: BP 122/67   Pulse 77   Ht 5' 7"  (1.702 m)   Wt 162 lb (73.5 kg)   SpO2 97%   BMI 25.37 kg/m   Body mass index is 25.37 kg/m.  Advanced Directives 02/21/2020 04/15/2017 11/12/2016 09/11/2016 07/17/2016 01/24/2016 12/06/2015  Does Patient Have a Medical Advance Directive? No No No No No No No  Would patient like information on creating a medical advance directive? Yes (ED - Information included in AVS) - - - No - patient declined information No - patient declined information No - patient declined information  Pre-existing out of facility DNR order (yellow form or pink MOST form) - - - - - - -    Tobacco Social History   Tobacco Use  Smoking Status Current Every Day Smoker  . Packs/day: 0.25  . Years: 15.00  . Pack years: 3.75  . Types: Cigarettes  Smokeless Tobacco Never Used  Tobacco Comment   4-5 daily     Ready to quit: Not Answered Counseling given: Not Answered Comment: 4-5 daily   Clinical Intake:  Pre-visit preparation completed: Yes  Pain : 0-10 Pain Score: 10-Worst pain  ever Pain Location: Knee Pain Orientation: Left     Diabetes: Yes CBG done?: Yes CBG resulted in Enter/ Edit results?: Yes Did pt. bring in CBG monitor from home?: No     Interpreter Needed?: No     Past Medical History:  Diagnosis Date  . Anxiety   . Arthritis    shoulder and knees  . CAD (coronary artery disease)   . DM (diabetes mellitus) ([place])   . HTN (hypertension)   . Hyperlipemia   . Myocardial infarction ([place])   . Sleep apnea    Past Surgical History:  Procedure Laterality Date  . CARDIAC CATHETERIZATION    . COLONOSCOPY    . COLONOSCOPY  09/25/2011   Procedure: COLONOSCOPY;  Surgeon: Winfield Cunas., MD;  Location: [facility];  Service: Endoscopy;  Laterality: N/A;  . CORONARY STENT PLACEMENT     Ostial LAD stent in 2012  . Left knee surgery     Family History  Problem Relation Age of Onset  . Heart attack Mother   . Anesthesia problems Neg Hx   . Hypotension Neg Hx   . Malignant hyperthermia Neg Hx   . Pseudochol deficiency Neg Hx    Social History   Socioeconomic History  . Marital status: Widowed    Spouse name: Not on file  . Number of children: Not on file  . Years of education: Not on file  . Highest education level: Not on file  Occupational History  .  Not on file  Tobacco Use  . Smoking status: Current Every Day Smoker    Packs/day: 0.25    Years: 15.00    Pack years: 3.75    Types: Cigarettes  . Smokeless tobacco: Never Used  . Tobacco comment: 4-5 daily  Substance and Sexual Activity  . Alcohol use: Yes    Alcohol/week: 1.0 standard drinks    Types: 1 Shots of liquor per week    Comment: occasionally   . Drug use: No  . Sexual activity: Not Currently    Birth control/protection: None  Other Topics Concern  . Not on file  Social History Narrative  . Not on file   Social Determinants of Health   Financial Resource Strain:   . Difficulty of Paying Living Expenses:   Food Insecurity:   . Worried About Ship broker in the Last Year:   . Arboriculturist in the Last Year:   Transportation Needs:   . Film/video editor (Medical):   Marland Kitchen Lack of Transportation (Non-Medical):   Physical Activity:   . Days of Exercise per Week:   . Minutes of Exercise per Session:   Stress:   . Feeling of Stress :   Social Connections:   . Frequency of Communication with Friends and Family:   . Frequency of Social Gatherings with Friends and Family:   . Attends Religious Services:   . Active Member of Clubs or Organizations:   . Attends Archivist Meetings:   Marland Kitchen Marital Status:     Outpatient Encounter Medications as of 02/21/2020  Medication Sig  . Accu-Chek FastClix Lancets MISC Use as directed once daily  . aspirin 81 MG tablet Take 1 tablet (81 mg total) by mouth daily.  . Blood Glucose Monitoring Suppl (ACCU-CHEK GUIDE ME) w/Device KIT 1 kit by Does not apply route daily. Use to check blood sugar daily.  . carvedilol (COREG) 12.5 MG tablet Take 1 tablet (12.5 mg total) by mouth 2 (two) times daily with a meal.  . glipiZIDE (GLUCOTROL) 10 MG tablet Take 1 tablet (10 mg total) by mouth daily before breakfast. Must have office visit for refills  . glucose blood (ACCU-CHEK AVIVA PLUS) test strip Use as instructed  . glucose blood (ACCU-CHEK GUIDE) test strip Use as instructed  . hydrochlorothiazide (HYDRODIURIL) 25 MG tablet Take 1 tablet (25 mg total) by mouth daily.  . metFORMIN (GLUCOPHAGE) 1000 MG tablet Take 1 tablet (1,000 mg total) by mouth 2 (two) times daily with a meal.  . Multiple Vitamin (MULTIVITAMIN WITH MINERALS) TABS tablet Take 1 tablet by mouth daily.  . nitroGLYCERIN (NITROSTAT) 0.4 MG SL tablet Place 0.4 mg under the tongue every 5 (five) minutes as needed for chest pain.  Marland Kitchen amLODipine (NORVASC) 10 MG tablet Take 1 tablet (10 mg total) by mouth daily. Must keep upcoming office visit for refills.  Marland Kitchen buPROPion (WELLBUTRIN XL) 150 MG 24 hr tablet Take 1 tablet (150 mg total) by mouth  daily. For smoking cessation (Patient not taking: Reported on 02/21/2020)  . CRESTOR 20 MG tablet Take 1 tablet (20 mg total) by mouth daily. (Patient not taking: Reported on 02/21/2020)   No facility-administered encounter medications on file as of 02/21/2020.    Activities of Daily Living In your present state of health, do you have any difficulty performing the following activities: 02/21/2020  Hearing? Y  Comment L eear sometimes  Vision? N  Difficulty concentrating or making decisions? Y  Walking  or climbing stairs? Y  Dressing or bathing? N  Doing errands, shopping? N  Some recent data might be hidden    Patient Care Team: Charlott Rakes, MD as PCP - General (Family Medicine)    Assessment:   This is a routine wellness examination for Tammy Graham.  Exercise Activities and Dietary recommendations    Goals   None     Fall Risk Fall Risk  02/21/2020 01/03/2020 07/01/2019 05/11/2018 04/15/2017  Falls in the past year? 0 0 0 Yes No  Number falls in past yr: - - 0 1 -  Injury with Fall? - - 0 Yes -  Comment - - - Pt. stated she fell a month ago, wrist sore, bruise on her forehead. Fell at work.  -   Is the patient's home free of loose throw rugs in walkways, pet beds, electrical cords, etc?   yes      Grab bars in the bathroom? no      Handrails on the stairs?   n/a      Adequate lighting?   yes  Timed Get Up and Go performed: yes  Depression Screen PHQ 2/9 Scores 02/21/2020 01/03/2020 11/23/2018 05/11/2018  PHQ - 2 Score 0 0 0 0  PHQ- 9 Score 0 1 0 1     Cognitive Function         There is no immunization history on file for this patient.  Qualifies for Shingles Vaccine? yes  Screening Tests Health Maintenance  Topic Date Due  . FOOT EXAM  Never done  . COVID-19 Vaccine (1) Never done  . TETANUS/TDAP  06/30/2020 (Originally 12/29/1969)  . Hepatitis C Screening  06/30/2020 (Originally 01-02-1951)  . PNA vac Low Risk Adult (1 of 2 - PCV13) 06/30/2020 (Originally 12/30/2015)    . INFLUENZA VACCINE  05/20/2020  . URINE MICROALBUMIN  06/30/2020  . HEMOGLOBIN A1C  07/05/2020  . OPHTHALMOLOGY EXAM  09/05/2020  . MAMMOGRAM  12/20/2020  . COLONOSCOPY  09/24/2021  . DEXA SCAN  Completed    Cancer Screenings: Lung: Low Dose CT Chest recommended if Age 65-80 years, 30 pack-year currently smoking OR have quit w/in 15years. Patient does qualify. Breast:  Up to date on Mammogram? No   Up to date of Bone Density/Dexa? Yes Colorectal: yes  Additional Screenings:  Hepatitis C Screening:      Constitutional: normal appearing,  Eyes: PERRLA HEENT: Head is atraumatic, normal sinuses, normal oropharynx, normal appearing tonsils and palate, tympanic membrane is normal bilaterally. Neck: normal range of motion, no thyromegaly, no JVD Cardiovascular: normal rate and rhythm, normal heart sounds, no murmurs, rub or gallop, no pedal edema Respiratory: Normal breath sounds, clear to auscultation bilaterally, no wheezes, no rales, no rhonchi Abdomen: soft, not tender to palpation, normal bowel sounds, no enlarged organs Musculoskeletal: Full ROM, no tenderness in joints Skin: warm and dry, no lesions. Neurological: alert, oriented x3, cranial nerves I-XII grossly intact , normal motor strength, normal sensation. Psychological: normal mood.   Plan:    1. Encounter for Medicare annual wellness exam Counseled on 150 minutes of exercise per week, healthy eating (including decreased daily intake of saturated fats, cholesterol, added sugars, sodium), routine healthcare maintenance.   2. Type 2 diabetes mellitus with diabetic polyneuropathy, without long-term current use of insulin (HCC) Commenced on gabapentin for diabetic neuropathy - POCT glucose (manual entry) - CMP14+EGFR  3. Tobacco use Spent 3 minutes counseling on smoking cessation and she is not ready to quit but is cutting back. -  CT CHEST LUNG CA SCREEN LOW DOSE W/O CM; Future   I have personally reviewed and  noted the following in the patient's chart:   . Medical and social history . Use of alcohol, tobacco or illicit drugs  . Current medications and supplements . Functional ability and status . Nutritional status . Physical activity . Advanced directives . List of other physicians . Hospitalizations, surgeries, and ER visits in previous 12 months . Vitals . Screenings to include cognitive, depression, and falls . Referrals and appointments  In addition, I have reviewed and discussed with patient certain preventive protocols, quality metrics, and best practice recommendations. A written personalized care plan for preventive services as well as general preventive health recommendations were provided to patient.     Charlott Rakes, MD  02/21/2020

## 2020-02-22 ENCOUNTER — Other Ambulatory Visit: Payer: Self-pay | Admitting: Family Medicine

## 2020-02-22 LAB — CMP14+EGFR
ALT: 15 IU/L (ref 0–32)
AST: 18 IU/L (ref 0–40)
Albumin/Globulin Ratio: 2 (ref 1.2–2.2)
Albumin: 4.5 g/dL (ref 3.8–4.8)
Alkaline Phosphatase: 89 IU/L (ref 39–117)
BUN/Creatinine Ratio: 12 (ref 12–28)
BUN: 14 mg/dL (ref 8–27)
Bilirubin Total: 0.7 mg/dL (ref 0.0–1.2)
CO2: 22 mmol/L (ref 20–29)
Calcium: 10.2 mg/dL (ref 8.7–10.3)
Chloride: 105 mmol/L (ref 96–106)
Creatinine, Ser: 1.17 mg/dL — ABNORMAL HIGH (ref 0.57–1.00)
GFR calc Af Amer: 55 mL/min/{1.73_m2} — ABNORMAL LOW (ref >59)
GFR calc non Af Amer: 48 mL/min/{1.73_m2} — ABNORMAL LOW (ref >59)
Globulin, Total: 2.2 g/dL (ref 1.5–4.5)
Glucose: 91 mg/dL (ref 65–99)
Potassium: 4.2 mmol/L (ref 3.5–5.2)
Sodium: 141 mmol/L (ref 134–144)
Total Protein: 6.7 g/dL (ref 6.0–8.5)

## 2020-02-22 MED ORDER — MISC. DEVICES MISC: 0 refills | Status: AC

## 2020-02-23 ENCOUNTER — Ambulatory Visit: Payer: Medicare Other

## 2020-02-27 ENCOUNTER — Ambulatory Visit (HOSPITAL_BASED_OUTPATIENT_CLINIC_OR_DEPARTMENT_OTHER): Payer: Medicare Other

## 2020-04-25 ENCOUNTER — Other Ambulatory Visit: Payer: Self-pay | Admitting: Nurse Practitioner

## 2020-04-25 ENCOUNTER — Other Ambulatory Visit: Payer: Self-pay | Admitting: Family Medicine

## 2020-04-25 DIAGNOSIS — E119 Type 2 diabetes mellitus without complications: Secondary | ICD-10-CM

## 2020-04-25 NOTE — Telephone Encounter (Signed)
Requested Prescriptions  Pending Prescriptions Disp Refills   Accu-Chek FastClix Lancets MISC [Pharmacy Med Name: ACCU-CHEK FASTCLIX LANCETS 102'S] 102 each     Sig: USE ONCE DAILY AS DIRECTED     Endocrinology: Diabetes - Testing Supplies Passed - 04/25/2020  4:04 AM      Passed - Valid encounter within last 12 months    Recent Outpatient Visits          2 months ago Type 2 diabetes mellitus with diabetic polyneuropathy, without long-term current use of insulin ([place])   [place], Charlane Ferretti, MD   3 months ago Tobacco use   [place], [place], MD   9 months ago Type 2 diabetes mellitus without complication, without long-term current use of insulin [facility])   [place], [place] W, NP   1 year ago Type 2 diabetes mellitus without complication, without long-term current use of insulin [facility])   [place], [place] W, NP   1 year ago Type 2 diabetes mellitus without complication, without long-term current use of insulin [facility])   [place], Vernia Buff, NP

## 2020-04-25 NOTE — Telephone Encounter (Signed)
Requested medication (s) are due for refill today: yes  Requested medication (s) are on the active medication list:yes  Last refill: 02/21/2020  #60  0 refills  Future visit scheduled no  Notes to clinic: not delegated  Requested Prescriptions  Pending Prescriptions Disp Refills   acetaminophen-codeine (TYLENOL #3) 300-30 MG tablet [Pharmacy Med Name: ACETAMINOPHEN/COD #3 (300/30MG ) TAB] 60 tablet     Sig: TAKE 1 TABLET BY MOUTH EVERY 12 HOURS AS NEEDED FOR MODERATE PAIN      Not Delegated - Analgesics:  Opioid Agonist Combinations Failed - 04/25/2020 11:25 AM      Failed - This refill cannot be delegated      Failed - Urine Drug Screen completed in last 360 days.      Passed - Valid encounter within last 6 months    Recent Outpatient Visits           2 months ago Type 2 diabetes mellitus with diabetic polyneuropathy, without long-term current use of insulin ([place])   [place], Charlane Ferretti, MD   3 months ago Tobacco use   [place], [place], MD   9 months ago Type 2 diabetes mellitus without complication, without long-term current use of insulin [facility])   [place], [place] W, NP   1 year ago Type 2 diabetes mellitus without complication, without long-term current use of insulin [facility])   [place], [place] W, NP   1 year ago Type 2 diabetes mellitus without complication, without long-term current use of insulin [facility])   [place], Vernia Buff, NP

## 2020-04-25 NOTE — Telephone Encounter (Signed)
Refill requested on Tylenol #3

## 2020-06-02 ENCOUNTER — Other Ambulatory Visit: Payer: Self-pay | Admitting: Nurse Practitioner

## 2020-06-02 DIAGNOSIS — E119 Type 2 diabetes mellitus without complications: Secondary | ICD-10-CM

## 2020-06-22 ENCOUNTER — Other Ambulatory Visit: Payer: Self-pay | Admitting: Nurse Practitioner

## 2020-06-22 ENCOUNTER — Other Ambulatory Visit: Payer: Self-pay | Admitting: Family Medicine

## 2020-06-22 DIAGNOSIS — E119 Type 2 diabetes mellitus without complications: Secondary | ICD-10-CM

## 2020-06-22 DIAGNOSIS — I10 Essential (primary) hypertension: Secondary | ICD-10-CM

## 2020-07-10 ENCOUNTER — Other Ambulatory Visit: Payer: Self-pay | Admitting: Family Medicine

## 2020-07-10 DIAGNOSIS — I10 Essential (primary) hypertension: Secondary | ICD-10-CM

## 2020-07-10 DIAGNOSIS — E119 Type 2 diabetes mellitus without complications: Secondary | ICD-10-CM

## 2020-07-10 NOTE — Telephone Encounter (Signed)
Requested Prescriptions  Pending Prescriptions Disp Refills  . amLODipine (NORVASC) 10 MG tablet [Pharmacy Med Name: AMLODIPINE BESYLATE 10MG  TABLETS] 90 tablet 1    Sig: TAKE 1 TABLET(10 MG) BY MOUTH DAILY     Cardiovascular:  Calcium Channel Blockers Failed - 07/10/2020  3:50 AM      Failed - Valid encounter within last 6 months    Recent Outpatient Visits          4 months ago Type 2 diabetes mellitus with diabetic polyneuropathy, without long-term current use of insulin ([place])   [place], [place], MD   6 months ago Tobacco use   [place], Charlane Ferretti, MD   1 year ago Type 2 diabetes mellitus without complication, without long-term current use of insulin ([place])   [place], Vernia Buff, NP   1 year ago Type 2 diabetes mellitus without complication, without long-term current use of insulin ([place])   [place], [place] W, NP   2 years ago Type 2 diabetes mellitus without complication, without long-term current use of insulin ([place])   [place], [place] W, NP             Passed - Last BP in normal range    BP Readings from Last 1 Encounters:  02/21/20 122/67         . glipiZIDE (GLUCOTROL) 10 MG tablet [Pharmacy Med Name: GLIPIZIDE 10MG  TABLETS] 90 tablet 1    Sig: TAKE 1 TABLET(10 MG) BY MOUTH DAILY BEFORE BREAKFAST     Endocrinology:  Diabetes - Sulfonylureas Failed - 07/10/2020  3:50 AM      Failed - HBA1C is between 0 and 7.9 and within 180 days    HbA1c, POC (controlled diabetic range)  Date Value Ref Range Status  01/03/2020 6.8 0.0 - 7.0 % Final         Failed - Valid encounter within last 6 months    Recent Outpatient Visits          4 months ago Type 2 diabetes mellitus with diabetic polyneuropathy, without long-term current use of insulin ([place])   [place], Charlane Ferretti, MD   6 months ago Tobacco use   [place], [place], MD   1 year ago Type 2 diabetes mellitus without complication, without long-term current use of insulin [facility])   [place], [place] W, NP   1 year ago Type 2 diabetes mellitus without complication, without long-term current use of insulin [facility])   [place], [place] W, NP   2 years ago Type 2 diabetes mellitus without complication, without long-term current use of insulin [facility])   [place] Gildardo Pounds, NP             Patient had valid encounter on 02/21/2020.

## 2020-07-12 DIAGNOSIS — I739 Peripheral vascular disease, unspecified: Secondary | ICD-10-CM | POA: Diagnosis not present

## 2020-07-12 DIAGNOSIS — E1351 Other specified diabetes mellitus with diabetic peripheral angiopathy without gangrene: Secondary | ICD-10-CM | POA: Diagnosis not present

## 2020-07-12 DIAGNOSIS — B351 Tinea unguium: Secondary | ICD-10-CM | POA: Diagnosis not present

## 2020-07-12 DIAGNOSIS — M792 Neuralgia and neuritis, unspecified: Secondary | ICD-10-CM | POA: Diagnosis not present

## 2020-07-12 DIAGNOSIS — M7731 Calcaneal spur, right foot: Secondary | ICD-10-CM | POA: Diagnosis not present

## 2020-07-20 ENCOUNTER — Other Ambulatory Visit: Payer: Self-pay | Admitting: Family Medicine

## 2020-07-20 NOTE — Telephone Encounter (Signed)
Requested medication (s) are due for refill today: yes  Requested medication (s) are on the active medication list: yes  Last refill:  06/02/20  Future visit scheduled: no valid encounter in last 6 months  Notes to clinic: not delegated    Requested Prescriptions  Pending Prescriptions Disp Refills   acetaminophen-codeine (TYLENOL #3) 300-30 MG tablet [Pharmacy Med Name: ACETAMINOPHEN/COD #3 (300/30MG ) TAB] 60 tablet     Sig: TAKE 1 TABLET BY MOUTH EVERY 12 HOURS AS NEEDED FOR MODERATE PAIN      Not Delegated - Analgesics:  Opioid Agonist Combinations Failed - 07/20/2020  8:50 AM      Failed - This refill cannot be delegated      Failed - Urine Drug Screen completed in last 360 days.      Failed - Valid encounter within last 6 months    Recent Outpatient Visits           5 months ago Type 2 diabetes mellitus with diabetic polyneuropathy, without long-term current use of insulin ([place])   [place], Charlane Ferretti, MD   6 months ago Tobacco use   [place], Charlane Ferretti, MD   1 year ago Type 2 diabetes mellitus without complication, without long-term current use of insulin [facility])   [place], [place] W, NP   1 year ago Type 2 diabetes mellitus without complication, without long-term current use of insulin [facility])   [name], [place] W, NP   2 years ago Type 2 diabetes mellitus without complication, without long-term current use of insulin [facility])   [place], Vernia Buff, NP

## 2020-08-02 DIAGNOSIS — I70201 Unspecified atherosclerosis of native arteries of extremities, right leg: Secondary | ICD-10-CM | POA: Diagnosis not present

## 2020-08-02 DIAGNOSIS — M792 Neuralgia and neuritis, unspecified: Secondary | ICD-10-CM | POA: Diagnosis not present

## 2020-08-02 DIAGNOSIS — E1351 Other specified diabetes mellitus with diabetic peripheral angiopathy without gangrene: Secondary | ICD-10-CM | POA: Diagnosis not present

## 2020-08-02 DIAGNOSIS — I739 Peripheral vascular disease, unspecified: Secondary | ICD-10-CM | POA: Diagnosis not present

## 2020-08-02 DIAGNOSIS — M7731 Calcaneal spur, right foot: Secondary | ICD-10-CM | POA: Diagnosis not present

## 2020-08-02 DIAGNOSIS — I70212 Atherosclerosis of native arteries of extremities with intermittent claudication, left leg: Secondary | ICD-10-CM | POA: Diagnosis not present

## 2020-08-06 ENCOUNTER — Telehealth: Payer: Self-pay | Admitting: Internal Medicine

## 2020-08-06 MED ORDER — ATORVASTATIN CALCIUM 10 MG PO TABS: 10.0000 mg | ORAL_TABLET | Freq: Every day | ORAL | 3 refills | Status: DC

## 2020-08-06 NOTE — Telephone Encounter (Signed)
-----   Message from Tresa Endo, [place] sent at 08/06/2020  1:35 PM EDT ----- Hello Dr. Wynetta Emery,   A pharmacist colleague reached concerning this patient and her statin use. She is followed by Margarita Rana.   Briefly, she is counting against our statin in diabetes metric. She has intolerances to simvastatin and rosuvastatin but can tolerate Crestor. However, PA has been denies for brand Crestor. My colleague reached out to her and the patient is willing to try generic atorvastatin but the rx was never sent.   Can we send in generic atorvastatin for her?   Thanks for all you do,   [employer]

## 2020-08-24 ENCOUNTER — Other Ambulatory Visit: Payer: Self-pay | Admitting: Family Medicine

## 2020-08-24 NOTE — Telephone Encounter (Signed)
Requested medication (s) are due for refill today: yes   Requested medication (s) are on the active medication list: yes   Last refill:  07/23/20 #60 0 refills   Future visit scheduled: no   Notes to clinic:  not delegated per protocol; last OV 6 months ago      Requested Prescriptions  Pending Prescriptions Disp Refills   acetaminophen-codeine (TYLENOL #3) 300-30 MG tablet 60 tablet 0      Not Delegated - Analgesics:  Opioid Agonist Combinations Failed - 08/24/2020  4:03 PM      Failed - This refill cannot be delegated      Failed - Urine Drug Screen completed in last 360 days      Failed - Valid encounter within last 6 months    Recent Outpatient Visits           6 months ago Type 2 diabetes mellitus with diabetic polyneuropathy, without long-term current use of insulin ([place])   [place], Charlane Ferretti, MD   7 months ago Tobacco use   [place], [place], MD   1 year ago Type 2 diabetes mellitus without complication, without long-term current use of insulin [facility])   [place], [place] W, NP   1 year ago Type 2 diabetes mellitus without complication, without long-term current use of insulin [facility])   [place], [place] W, NP   2 years ago Type 2 diabetes mellitus without complication, without long-term current use of insulin [facility])   [place], Vernia Buff, NP

## 2020-08-24 NOTE — Telephone Encounter (Signed)
Medication Refill - Medication: acetaminophen codeine  Has the patient contacted their pharmacy? Yes.   Pt states that she has been out of this medication for 3 weeks. Please advise. (Agent: If no, request that the patient contact the pharmacy for the refill.) (Agent: If yes, when and what did the pharmacy advise?)  Preferred Pharmacy (with phone number or street name):  [facility] [place], [address]  [address] [address] [place] [address]  Phone: [phone] Fax: [phone]  Hours: Not open 24 hours     Agent: Please be advised that RX refills may take up to 3 business days. We ask that you follow-up with your pharmacy.

## 2020-08-28 MED ORDER — ACETAMINOPHEN-CODEINE #3 300-30 MG PO TABS: ORAL_TABLET | ORAL | 0 refills | Status: DC

## 2020-09-05 DIAGNOSIS — M7731 Calcaneal spur, right foot: Secondary | ICD-10-CM | POA: Diagnosis not present

## 2020-09-05 DIAGNOSIS — M792 Neuralgia and neuritis, unspecified: Secondary | ICD-10-CM | POA: Diagnosis not present

## 2020-09-05 DIAGNOSIS — E1351 Other specified diabetes mellitus with diabetic peripheral angiopathy without gangrene: Secondary | ICD-10-CM | POA: Diagnosis not present

## 2020-09-05 DIAGNOSIS — L602 Onychogryphosis: Secondary | ICD-10-CM | POA: Diagnosis not present

## 2020-09-06 ENCOUNTER — Other Ambulatory Visit: Payer: Self-pay | Admitting: Family Medicine

## 2020-09-06 NOTE — Telephone Encounter (Signed)
Medication Refill - Medication: acetaminophen- codeine  Has the patient contacted their pharmacy? Yes.   PT states that when she went to pharmacy last week they only gave her 14 pills. Please advise. (Agent: If no, request that the patient contact the pharmacy for the refill.) (Agent: If yes, when and what did the pharmacy advise?)  Preferred Pharmacy (with phone number or street name):  [facility] DR[facility] STORE [place], [address] AT [address]  [place] [address] [place] [address]  Phone: [phone] Fax: [phone]  Hours: Not open 24 hours     Agent: Please be advised that RX refills may take up to 3 business days. We ask that you follow-up with your pharmacy.

## 2020-09-06 NOTE — Telephone Encounter (Signed)
Call to pharmacy- unless patient has PA from insurance- she can only get 1 week supply on narcotic( they do have remainder of Rx on file)- patient notified of pharmacy response- she is going to try to use her other insurance- she will let office know if she needs PA.

## 2020-09-08 ENCOUNTER — Other Ambulatory Visit: Payer: Self-pay | Admitting: Nurse Practitioner

## 2020-09-08 DIAGNOSIS — E119 Type 2 diabetes mellitus without complications: Secondary | ICD-10-CM

## 2020-09-09 MED ORDER — ACETAMINOPHEN-CODEINE #3 300-30 MG PO TABS: ORAL_TABLET | ORAL | 0 refills | Status: DC

## 2020-09-11 DIAGNOSIS — B351 Tinea unguium: Secondary | ICD-10-CM | POA: Diagnosis not present

## 2020-10-01 DIAGNOSIS — D2371 Other benign neoplasm of skin of right lower limb, including hip: Secondary | ICD-10-CM | POA: Diagnosis not present

## 2020-10-01 DIAGNOSIS — M792 Neuralgia and neuritis, unspecified: Secondary | ICD-10-CM | POA: Diagnosis not present

## 2020-10-01 DIAGNOSIS — M21961 Unspecified acquired deformity of right lower leg: Secondary | ICD-10-CM | POA: Diagnosis not present

## 2020-10-01 DIAGNOSIS — I739 Peripheral vascular disease, unspecified: Secondary | ICD-10-CM | POA: Diagnosis not present

## 2020-10-08 DIAGNOSIS — I70201 Unspecified atherosclerosis of native arteries of extremities, right leg: Secondary | ICD-10-CM | POA: Diagnosis not present

## 2020-10-15 ENCOUNTER — Other Ambulatory Visit: Payer: Self-pay | Admitting: Family Medicine

## 2020-10-15 DIAGNOSIS — I10 Essential (primary) hypertension: Secondary | ICD-10-CM

## 2020-10-15 DIAGNOSIS — E119 Type 2 diabetes mellitus without complications: Secondary | ICD-10-CM

## 2020-10-15 NOTE — Telephone Encounter (Signed)
Requested medication (s) are due for refill today: yes  Requested medication (s) are on the active medication list: yes  Last refill:  amlodipine: 07/10/20               glipizide: 07/10/20  Future visit scheduled: no  Notes to clinic:  Called pt but unable to LM. Pt is overdue lab work and needs appt   Requested Prescriptions  Pending Prescriptions Disp Refills   amLODipine (NORVASC) 10 MG tablet [Pharmacy Med Name: AMLODIPINE BESYLATE 10MG  TABLETS] 90 tablet 0    Sig: TAKE 1 TABLET(10 MG) BY MOUTH DAILY      Cardiovascular:  Calcium Channel Blockers Failed - 10/15/2020  3:51 AM      Failed - Valid encounter within last 6 months    Recent Outpatient Visits           7 months ago Type 2 diabetes mellitus with diabetic polyneuropathy, without long-term current use of insulin (HCC)   [facility] [place], [place], MD   9 months ago Tobacco use   [facility] [place], [place], MD   1 year ago Type 2 diabetes mellitus without complication, without long-term current use of insulin (HCC)   [place] [facility] [place], [place] W, NP   1 year ago Type 2 diabetes mellitus without complication, without long-term current use of insulin (HCC)   [place] [facility] [place], [place] W, NP   2 years ago Type 2 diabetes mellitus without complication, without long-term current use of insulin (HCC)   [place] [facility] [place], [place] W, NP                Passed - Last BP in normal range    BP Readings from Last 1 Encounters:  02/21/20 122/67            glipiZIDE (GLUCOTROL) 10 MG tablet [Pharmacy Med Name: GLIPIZIDE 10MG  TABLETS] 90 tablet 0    Sig: TAKE 1 TABLET(10 MG) BY MOUTH DAILY BEFORE BREAKFAST      Endocrinology:  Diabetes - Sulfonylureas Failed - 10/15/2020  3:51 AM      Failed - HBA1C is between 0 and 7.9 and within 180 days    HbA1c, POC (controlled  diabetic range)  Date Value Ref Range Status  01/03/2020 6.8 0.0 - 7.0 % Final          Failed - Valid encounter within last 6 months    Recent Outpatient Visits           7 months ago Type 2 diabetes mellitus with diabetic polyneuropathy, without long-term current use of insulin (HCC)   [facility] [place], 01/05/2020, MD   9 months ago Tobacco use   [place] [place], [place], MD   1 year ago Type 2 diabetes mellitus without complication, without long-term current use of insulin [facility])   [place] [facility] [place], [facility] W, NP   1 year ago Type 2 diabetes mellitus without complication, without long-term current use of insulin [facility])   [place] [facility] [place], [facility] W, NP   2 years ago Type 2 diabetes mellitus without complication, without long-term current use of insulin [facility])   [place] [facility] [place], [facility], NP

## 2020-10-16 ENCOUNTER — Other Ambulatory Visit: Payer: Self-pay | Admitting: Family Medicine

## 2020-10-16 DIAGNOSIS — E119 Type 2 diabetes mellitus without complications: Secondary | ICD-10-CM

## 2020-10-16 DIAGNOSIS — I10 Essential (primary) hypertension: Secondary | ICD-10-CM

## 2020-10-22 ENCOUNTER — Other Ambulatory Visit: Payer: Self-pay | Admitting: Family Medicine

## 2020-10-22 DIAGNOSIS — E119 Type 2 diabetes mellitus without complications: Secondary | ICD-10-CM

## 2020-10-29 DIAGNOSIS — I70223 Atherosclerosis of native arteries of extremities with rest pain, bilateral legs: Secondary | ICD-10-CM | POA: Diagnosis not present

## 2020-11-17 ENCOUNTER — Other Ambulatory Visit: Payer: Self-pay | Admitting: Family Medicine

## 2020-11-17 DIAGNOSIS — I10 Essential (primary) hypertension: Secondary | ICD-10-CM

## 2020-11-17 NOTE — Telephone Encounter (Signed)
Requested medication (s) are due for refill today: yes  Requested medication (s) are on the active medication list: yes  Last refill:  10/16/20 #30 (courtesy Refill)  Future visit scheduled: no- sent pt message on MyChart to call office to schedule appt and that has already been given 30 day courtesy RF  Notes to clinic:  needs appt   Requested Prescriptions  Pending Prescriptions Disp Refills   amLODipine (NORVASC) 10 MG tablet [Pharmacy Med Name: AMLODIPINE BESYLATE 10MG  TABLETS] 30 tablet 0    Sig: TAKE 1 TABLET(10 MG) BY MOUTH DAILY      Cardiovascular:  Calcium Channel Blockers Failed - 11/17/2020  3:50 AM      Failed - Valid encounter within last 6 months    Recent Outpatient Visits           9 months ago Type 2 diabetes mellitus with diabetic polyneuropathy, without long-term current use of insulin ([place])   [place], Charlane Ferretti, MD   10 months ago Tobacco use   [place], [place], MD   1 year ago Type 2 diabetes mellitus without complication, without long-term current use of insulin [facility])   [place], [place] W, NP   1 year ago Type 2 diabetes mellitus without complication, without long-term current use of insulin [facility])   [place], [place] W, NP   2 years ago Type 2 diabetes mellitus without complication, without long-term current use of insulin [facility])   [place], [place] W, NP                Passed - Last BP in normal range    BP Readings from Last 1 Encounters:  02/21/20 122/67

## 2020-11-18 DIAGNOSIS — I70235 Atherosclerosis of native arteries of right leg with ulceration of other part of foot: Secondary | ICD-10-CM | POA: Diagnosis not present

## 2020-11-18 DIAGNOSIS — L97519 Non-pressure chronic ulcer of other part of right foot with unspecified severity: Secondary | ICD-10-CM | POA: Diagnosis not present

## 2020-11-19 DIAGNOSIS — I739 Peripheral vascular disease, unspecified: Secondary | ICD-10-CM | POA: Diagnosis not present

## 2020-11-23 ENCOUNTER — Other Ambulatory Visit: Payer: Self-pay | Admitting: Family Medicine

## 2020-11-23 DIAGNOSIS — I10 Essential (primary) hypertension: Secondary | ICD-10-CM

## 2020-11-23 DIAGNOSIS — E119 Type 2 diabetes mellitus without complications: Secondary | ICD-10-CM

## 2020-11-23 MED ORDER — AMLODIPINE BESYLATE 10 MG PO TABS: 10.0000 mg | ORAL_TABLET | Freq: Every day | ORAL | 0 refills | Status: DC

## 2020-11-23 MED ORDER — GLIPIZIDE 10 MG PO TABS: ORAL_TABLET | ORAL | 0 refills | Status: DC

## 2020-11-23 NOTE — Telephone Encounter (Signed)
Copied from [place] [phone]. Topic: Quick Communication - Rx Refill/Question >> Nov 23, 2020 10:12 AM Leward Quan A wrote: Medication: amLODipine (NORVASC) 10 MG tablet, acetaminophen-codeine (TYLENOL #3) 300-30 MG tablet, glipiZIDE (GLUCOTROL) 10 MG tablet  Patient has an appointment scheduled on 01/24/21  Has the patient contacted their pharmacy? Yes.   (Agent: If no, request that the patient contact the pharmacy for the refill.) (Agent: If yes, when and what did the pharmacy advise?)  Preferred Pharmacy (with phone number or street name): [facility] [place], [place] AT [address]  Phone:  [phone] Fax:  [phone]     Agent: Please be advised that RX refills may take up to 3 business days. We ask that you follow-up with your pharmacy.

## 2020-11-23 NOTE — Telephone Encounter (Signed)
Requested medication (s) are due for refill today: no  Requested medication (s) are on the active medication list: yes  Last refill:  09/09/2020  Future visit scheduled: yes  Notes to clinic:  this refill cannot be delegated   Requested Prescriptions  Pending Prescriptions Disp Refills   acetaminophen-codeine (TYLENOL #3) 300-30 MG tablet 60 tablet 0    Sig: TAKE 1 TABLET BY MOUTH EVERY 12 HOURS AS NEEDED FOR MODERATE PAIN      Not Delegated - Analgesics:  Opioid Agonist Combinations Failed - 11/23/2020 10:21 AM      Failed - This refill cannot be delegated      Failed - Urine Drug Screen completed in last 360 days      Failed - Valid encounter within last 6 months    Recent Outpatient Visits           9 months ago Type 2 diabetes mellitus with diabetic polyneuropathy, without long-term current use of insulin ([place])   [place], [place], MD   10 months ago Tobacco use   [place], Charlane Ferretti, MD   1 year ago Type 2 diabetes mellitus without complication, without long-term current use of insulin ([place])   [place], [place] W, NP   2 years ago Type 2 diabetes mellitus without complication, without long-term current use of insulin ([place])   [place], [place] W, NP   2 years ago Type 2 diabetes mellitus without complication, without long-term current use of insulin ([place])   [place], Vernia Buff, NP       Future Appointments             In 2 months Charlott Rakes, MD [place]              Signed Prescriptions Disp Refills   glipiZIDE (GLUCOTROL) 10 MG tablet 30 tablet 0    Sig: TAKE 1 TABLET(10 MG) BY MOUTH DAILY BEFORE BREAKFAST      Endocrinology:  Diabetes - Sulfonylureas Failed - 11/23/2020 10:21 AM      Failed - HBA1C is between 0 and 7.9 and within  180 days    HbA1c, POC (controlled diabetic range)  Date Value Ref Range Status  01/03/2020 6.8 0.0 - 7.0 % Final          Failed - Valid encounter within last 6 months    Recent Outpatient Visits           9 months ago Type 2 diabetes mellitus with diabetic polyneuropathy, without long-term current use of insulin ([place])   [place], Charlane Ferretti, MD   10 months ago Tobacco use   [place], [place], MD   1 year ago Type 2 diabetes mellitus without complication, without long-term current use of insulin [facility])   [place], [place] W, NP   2 years ago Type 2 diabetes mellitus without complication, without long-term current use of insulin [facility])   [place], [place] W, NP   2 years ago Type 2 diabetes mellitus without complication, without long-term current use of insulin [facility])   [place], Vernia Buff, NP       Future Appointments  In 2 months Charlott Rakes, MD [place]               amLODipine (NORVASC) 10 MG tablet 30 tablet 0    Sig: Take 1 tablet (10 mg total) by mouth daily. Must have office visit for refills      Cardiovascular:  Calcium Channel Blockers Failed - 11/23/2020 10:21 AM      Failed - Valid encounter within last 6 months    Recent Outpatient Visits           9 months ago Type 2 diabetes mellitus with diabetic polyneuropathy, without long-term current use of insulin ([place])   [place], Charlane Ferretti, MD   10 months ago Tobacco use   [place], Charlane Ferretti, MD   1 year ago Type 2 diabetes mellitus without complication, without long-term current use of insulin [facility])   [place], [place] W, NP   2 years ago Type 2 diabetes mellitus  without complication, without long-term current use of insulin [facility])   [place], [place] W, NP   2 years ago Type 2 diabetes mellitus without complication, without long-term current use of insulin [facility])   [place], Vernia Buff, NP       Future Appointments             In 2 months Charlott Rakes, MD [place] - Last BP in normal range    BP Readings from Last 1 Encounters:  02/21/20 122/67

## 2020-11-24 ENCOUNTER — Other Ambulatory Visit: Payer: Self-pay | Admitting: Family Medicine

## 2020-11-24 NOTE — Telephone Encounter (Signed)
Requested medication (s) are due for refill today: yes  Requested medication (s) are on the active medication list: yes  Last refill:  09/09/20 #60 0 refills   Future visit scheduled: yes in 2 months  Notes to clinic:  not delegated per protocol     Requested Prescriptions  Pending Prescriptions Disp Refills   acetaminophen-codeine (TYLENOL #3) 300-30 MG tablet [Pharmacy Med Name: ACETAMINOPHEN/COD #3 (300/30MG ) TAB] 60 tablet     Sig: TAKE 1 TABLET BY MOUTH EVERY 12 HOURS AS NEEDED FOR MODERATE PAIN      Not Delegated - Analgesics:  Opioid Agonist Combinations Failed - 11/24/2020 12:11 PM      Failed - This refill cannot be delegated      Failed - Urine Drug Screen completed in last 360 days      Failed - Valid encounter within last 6 months    Recent Outpatient Visits           9 months ago Type 2 diabetes mellitus with diabetic polyneuropathy, without long-term current use of insulin ([place])   [place], Charlane Ferretti, MD   10 months ago Tobacco use   [place], [place], MD   1 year ago Type 2 diabetes mellitus without complication, without long-term current use of insulin ([place])   [place], [place] W, NP   2 years ago Type 2 diabetes mellitus without complication, without long-term current use of insulin [facility])   [place], [place] W, NP   2 years ago Type 2 diabetes mellitus without complication, without long-term current use of insulin [facility])   [place], Vernia Buff, NP       Future Appointments             In 2 months Charlott Rakes, MD [place]

## 2020-12-03 ENCOUNTER — Telehealth: Payer: Self-pay | Admitting: Cardiovascular Disease

## 2020-12-03 NOTE — Telephone Encounter (Signed)
Spoke with patient. She tells me she saw her foot doctor in Dec or the beginning of Jan and c/o tingling in her leg.  Her podiatrist had an US done and they told her she had a blood clot in that leg.    I asked her if she has been treated for a blood clot.  She said she has not and that 2 sundays ago at a clinic in [place], [place], Dr. Pollie Friar tried to do a procedure in her groin but couldn't finish it.  She is requesting that we get records from her foot doctor and Dr. Pollie Friar for Dr. Angelena Form to review.  She is seeing the foot doctor again on 12/05/20.  I will send a message to med rec and chart prep to see if we can obtain records for her visit with Dr. Angelena Form on 12/14/20.

## 2020-12-03 NOTE — Telephone Encounter (Signed)
Pt called in and stated that she went to [facility] foot dr and they told her she has a blood clot in her leg. They sent for her for a ultra the 1st part of jan.  She is wanting to see DR Angelena Form and him only about the blood clot.    Best number [phone] Store [place] call and ask for [employer]

## 2020-12-04 ENCOUNTER — Other Ambulatory Visit: Payer: Self-pay | Admitting: Family Medicine

## 2020-12-04 DIAGNOSIS — I10 Essential (primary) hypertension: Secondary | ICD-10-CM

## 2020-12-05 DIAGNOSIS — M792 Neuralgia and neuritis, unspecified: Secondary | ICD-10-CM | POA: Diagnosis not present

## 2020-12-05 DIAGNOSIS — M21961 Unspecified acquired deformity of right lower leg: Secondary | ICD-10-CM | POA: Diagnosis not present

## 2020-12-05 DIAGNOSIS — S9031XA Contusion of right foot, initial encounter: Secondary | ICD-10-CM | POA: Diagnosis not present

## 2020-12-05 DIAGNOSIS — D2371 Other benign neoplasm of skin of right lower limb, including hip: Secondary | ICD-10-CM | POA: Diagnosis not present

## 2020-12-14 ENCOUNTER — Other Ambulatory Visit: Payer: Self-pay

## 2020-12-14 ENCOUNTER — Encounter: Payer: Self-pay | Admitting: Cardiovascular Disease

## 2020-12-14 ENCOUNTER — Ambulatory Visit (INDEPENDENT_AMBULATORY_CARE_PROVIDER_SITE_OTHER): Payer: BC Managed Care – PPO | Admitting: Cardiovascular Disease

## 2020-12-14 VITALS — BP 138/76 | HR 61 | Ht 67.0 in | Wt 164.0 lb

## 2020-12-14 DIAGNOSIS — I739 Peripheral vascular disease, unspecified: Secondary | ICD-10-CM | POA: Diagnosis not present

## 2020-12-14 DIAGNOSIS — I10 Essential (primary) hypertension: Secondary | ICD-10-CM

## 2020-12-14 DIAGNOSIS — I2510 Atherosclerotic heart disease of native coronary artery without angina pectoris: Secondary | ICD-10-CM

## 2020-12-14 NOTE — Patient Instructions (Signed)
Medication Instructions:   No changes today *If you need a refill on your cardiac medications before your next appointment, please call your pharmacy*   Lab Work: none If you have labs (blood work) drawn today and your tests are completely normal, you will receive your results only by: Marland Kitchen MyChart Message (if you have MyChart) OR . A paper copy in the mail If you have any lab test that is abnormal or we need to change your treatment, we will call you to review the results.   Testing/Procedures: none  Follow-Up: At [facility], you and your health needs are our priority.  As part of our continuing mission to provide you with exceptional heart care, we have created designated Provider Care Teams.  These Care Teams include your primary Cardiologist (physician) and Advanced Practice Providers (APPs -  Physician Assistants and Nurse Practitioners) who all work together to provide you with the care you need, when you need it.  Your next appointment:   12 month(s)  The format for your next appointment:   In Person  Provider:   You may see Lauree Chandler, MD or one of the following Advanced Practice Providers on your designated Care Team:    Melina Copa, PA-C  Ermalinda Barrios, PA-C    Other Instructions You have been referred to [facility]  They will call you to schedule the consultation.

## 2020-12-14 NOTE — Progress Notes (Signed)
Chief Complaint  Patient presents with  . Follow-up    PAD   History of Present Illness: 70 yo female with history of CAD, DM, HTN, HLD, tobacco abuse, anxiety and sleep apnea who is here today for cardiac follow up. She had a NSTEMI in 2012 and I placed a drug eluting stent in the ostium of her LAD. Mild disease noted in the RCA at that time. LV systolic function was normal. She is a current everyday smoker. She has diabetes and HTN. She was lost to follow up in our office since 2012 but she did see Dr. Verl Blalock in the [facility] in 2015. I saw her back in the office in 2019 and she was doing well.  She had a tingling in her right foot around Christmas and was seen in podiatry. She had poor right pedal pulses and was seen by Dr. Oletha Blend in [place], [place] (IR/Vascular medicine). Non-invasive studies with severe disease in the right lower extremity. Dr. Carlton Adam performed LE angiography and she was found to have total occlusion of the right superficial femoral artery 11/18/20. He could not cross the lesion from an antegrade approach and went to pedal access. He was unable to cross the occlusion. It looks like angioplasty was performed to facilitate advancing the wire. No stent was placed.   She is here today for follow up. The patient denies any chest pain, dyspnea, palpitations, lower extremity edema, orthopnea, PND, dizziness, near syncope or syncope. She continues to have tingling in her right foot but no rest pain in her leg or foot, no ulcers or wounds. She is requesting to see Dr. Donzetta Matters in [facility] for evaluation of her PAD.   Primary Care Physician: Charlott Rakes, MD   Past Medical History:  Diagnosis Date  . Anxiety   . Arthritis    shoulder and knees  . CAD (coronary artery disease)   . DM (diabetes mellitus) ([place])   . HTN (hypertension)   . Hyperlipemia   . Myocardial infarction ([place])   . Sleep apnea     Past Surgical History:  Procedure Laterality Date  .  CARDIAC CATHETERIZATION    . COLONOSCOPY    . COLONOSCOPY  09/25/2011   Procedure: COLONOSCOPY;  Surgeon: Winfield Cunas., MD;  [facility];  Service: Endoscopy;  Laterality: N/A;  . CORONARY STENT PLACEMENT     Ostial LAD stent in 2012  . Left knee surgery      Current Outpatient Medications  Medication Sig Dispense Refill  . Accu-Chek FastClix Lancets MISC USE ONCE DAILY AS DIRECTED 102 each 3  . ACCU-CHEK GUIDE test strip USE AS INSTRUCTED 100 strip 1  . acetaminophen-codeine (TYLENOL #3) 300-30 MG tablet TAKE 1 TABLET BY MOUTH EVERY 12 HOURS AS NEEDED FOR MODERATE PAIN 60 tablet 1  . amLODipine (NORVASC) 10 MG tablet Take 1 tablet (10 mg total) by mouth daily. Must have office visit for refills 30 tablet 0  . aspirin 81 MG tablet Take 1 tablet (81 mg total) by mouth daily. 90 tablet 3  . atorvastatin (LIPITOR) 10 MG tablet Take 1 tablet (10 mg total) by mouth daily. 30 tablet 3  . Blood Glucose Monitoring Suppl (ACCU-CHEK GUIDE ME) w/Device KIT 1 kit by Does not apply route daily. Use to check blood sugar daily. 1 kit 0  . carvedilol (COREG) 12.5 MG tablet TAKE 1 TABLET(12.5 MG) BY MOUTH TWICE DAILY WITH A MEAL 180 tablet 1  . glipiZIDE (  GLUCOTROL) 10 MG tablet TAKE 1 TABLET(10 MG) BY MOUTH DAILY BEFORE BREAKFAST 30 tablet 0  . glucose blood (ACCU-CHEK AVIVA PLUS) test strip Use as instructed 100 each 12  . hydrochlorothiazide (HYDRODIURIL) 25 MG tablet TAKE 1 TABLET(25 MG) BY MOUTH DAILY 90 tablet 0  . metFORMIN (GLUCOPHAGE) 1000 MG tablet TAKE 1 TABLET(1000 MG) BY MOUTH TWICE DAILY WITH A MEAL 180 tablet 1  . Misc. Devices MISC Shower bench, Above the toilet seat.  Diagnosis- Type 2 Diabetes Mellitus 1 each 0  . Multiple Vitamin (MULTIVITAMIN WITH MINERALS) TABS tablet Take 1 tablet by mouth daily.    . nitroGLYCERIN (NITROSTAT) 0.4 MG SL tablet Place 0.4 mg under the tongue every 5 (five) minutes as needed for chest pain.     No current facility-administered  medications for this visit.    Allergies  Allergen Reactions  . Benadryl [Diphenhydramine Hcl (Sleep)] Other (See Comments)    unknown  . Rosuvastatin Hives    Patient is allergic to generic rosuvastatin. She says she is able to take branded Crestor.   . Simvastatin Other (See Comments)    unknown    Social History   Socioeconomic History  . Marital status: Widowed    Spouse name: Not on file  . Number of children: Not on file  . Years of education: Not on file  . Highest education level: Not on file  Occupational History  . Not on file  Tobacco Use  . Smoking status: Current Every Day Smoker    Packs/day: 0.25    Years: 15.00    Pack years: 3.75    Types: Cigarettes  . Smokeless tobacco: Never Used  . Tobacco comment: 4-5 daily  Vaping Use  . Vaping Use: Never used  Substance and Sexual Activity  . Alcohol use: Yes    Alcohol/week: 1.0 standard drink    Types: 1 Shots of liquor per week    Comment: occasionally   . Drug use: No  . Sexual activity: Not Currently    Birth control/protection: None  Other Topics Concern  . Not on file  Social History Narrative  . Not on file   Social Determinants of Health   Financial Resource Strain: Not on file  Food Insecurity: Not on file  Transportation Needs: Not on file  Physical Activity: Not on file  Stress: Not on file  Social Connections: Not on file  Intimate Partner Violence: Not on file    Family History  Problem Relation Age of Onset  . Heart attack Mother   . Anesthesia problems Neg Hx   . Hypotension Neg Hx   . Malignant hyperthermia Neg Hx   . Pseudochol deficiency Neg Hx     Review of Systems:  As stated in the HPI and otherwise negative.   BP 138/76   Pulse 61   Ht _0  (1.702 m)   Wt 164 lb (74.4 kg)   SpO2 97%   BMI 25.69 kg/m   Physical Examination: General: Well developed, well nourished, NAD  HEENT: OP clear, mucus membranes moist  SKIN: warm, dry. No rashes. Neuro: No focal  deficits  Musculoskeletal: Muscle strength 5/5 all ext  Psychiatric: Mood and affect normal  Neck: No JVD, no carotid bruits, no thyromegaly, no lymphadenopathy.  Lungs:Clear bilaterally, no wheezes, rhonci, crackles Cardiovascular: Regular rate and rhythm. No murmurs, gallops or rubs. Abdomen:Soft. Bowel sounds present. Non-tender.  Extremities: No lower extremity edema. Pulses are 2 + in the bilateral DP/PT.  EKG:  EKG is  ordered today. The ekg ordered today demonstrates Sinus  Recent Labs: 02/21/2020: ALT 15; BUN 14; Creatinine, Ser 1.17; Potassium 4.2; Sodium 141   Lipid Panel    Component Value Date/Time   CHOL 184 07/01/2019 1550   TRIG 206 (H) 07/01/2019 1550   HDL 47 07/01/2019 1550   CHOLHDL 3.9 07/01/2019 1550   CHOLHDL 3.1 05/04/2014 0919   VLDL 18 05/04/2014 0919   LDLCALC 102 (H) 07/01/2019 1550     Wt Readings from Last 3 Encounters:  12/14/20 164 lb (74.4 kg)  02/21/20 162 lb (73.5 kg)  01/03/20 164 lb 3.2 oz (74.5 kg)    Assessment and Plan:   1. CAD without angina: No chest pain. Continue ASA. statin and beta blocker.  If she needs to have fem/pop bypass, will need to have a stress test but will wait to arrange that  2. Tobacco abuse: Smoking cessation is advised.   3. HTN: BP is controlled. No changes  4. PAD: She has occlusion of the right SFA and had an unsuccessful attempt at PTA/stenting of the SFA occlusion per records in [place], [place]. She has tingling in her right foot but no wounds, claudication or rest pain. She is requesting to see Dr. Donzetta Matters in [facility] who was recommended by her podiatrist. I will refer her to see Dr. Donzetta Matters to review options for another attempt at percutaneous therapy vs surgery vs medical management of her PAD.   Current medicines are reviewed at length with the patient today.  The patient does not have concerns regarding medicines.  The following changes have been made:  no change  Labs/ tests ordered today include:   Orders Placed  This Encounter  Procedures  . Ambulatory referral to Vascular Surgery  . EKG 12-Lead     Disposition:   F/U with me in 12 months   Signed, Lauree Chandler, MD 12/14/2020 11:29 AM    [facility] [place], [place], [place]  [address] Phone: [phone]; Fax: [phone]

## 2020-12-27 ENCOUNTER — Other Ambulatory Visit: Payer: Self-pay | Admitting: Family Medicine

## 2020-12-27 DIAGNOSIS — E119 Type 2 diabetes mellitus without complications: Secondary | ICD-10-CM

## 2020-12-27 DIAGNOSIS — I10 Essential (primary) hypertension: Secondary | ICD-10-CM

## 2021-01-02 DIAGNOSIS — S9031XD Contusion of right foot, subsequent encounter: Secondary | ICD-10-CM | POA: Diagnosis not present

## 2021-01-02 DIAGNOSIS — M21961 Unspecified acquired deformity of right lower leg: Secondary | ICD-10-CM | POA: Diagnosis not present

## 2021-01-02 DIAGNOSIS — D2371 Other benign neoplasm of skin of right lower limb, including hip: Secondary | ICD-10-CM | POA: Diagnosis not present

## 2021-01-02 DIAGNOSIS — M792 Neuralgia and neuritis, unspecified: Secondary | ICD-10-CM | POA: Diagnosis not present

## 2021-01-08 ENCOUNTER — Other Ambulatory Visit: Payer: Self-pay

## 2021-01-08 DIAGNOSIS — I739 Peripheral vascular disease, unspecified: Secondary | ICD-10-CM

## 2021-01-11 ENCOUNTER — Ambulatory Visit (HOSPITAL_COMMUNITY)
Admission: RE | Admit: 2021-01-11 | Discharge: 2021-01-11 | Disposition: A | Discharge status: Home / Self Care | Payer: BC Managed Care – PPO | Source: Ambulatory Visit | Attending: Vascular Surgery | Admitting: Vascular Surgery

## 2021-01-11 ENCOUNTER — Other Ambulatory Visit: Payer: Self-pay

## 2021-01-11 ENCOUNTER — Ambulatory Visit (INDEPENDENT_AMBULATORY_CARE_PROVIDER_SITE_OTHER): Payer: BC Managed Care – PPO | Admitting: Vascular Surgery

## 2021-01-11 ENCOUNTER — Encounter: Payer: Self-pay | Admitting: Vascular Surgery

## 2021-01-11 VITALS — BP 130/66 | HR 71 | Temp 97.2°F | Resp 20 | Ht 67.0 in | Wt 161.0 lb

## 2021-01-11 DIAGNOSIS — I739 Peripheral vascular disease, unspecified: Secondary | ICD-10-CM | POA: Insufficient documentation
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
## 2021-01-16 ENCOUNTER — Other Ambulatory Visit: Payer: Self-pay | Admitting: Family Medicine

## 2021-01-16 DIAGNOSIS — D2371 Other benign neoplasm of skin of right lower limb, including hip: Secondary | ICD-10-CM | POA: Diagnosis not present

## 2021-01-16 DIAGNOSIS — M21961 Unspecified acquired deformity of right lower leg: Secondary | ICD-10-CM | POA: Diagnosis not present

## 2021-01-16 DIAGNOSIS — S9031XD Contusion of right foot, subsequent encounter: Secondary | ICD-10-CM | POA: Diagnosis not present

## 2021-01-16 DIAGNOSIS — I10 Essential (primary) hypertension: Secondary | ICD-10-CM

## 2021-01-16 DIAGNOSIS — M792 Neuralgia and neuritis, unspecified: Secondary | ICD-10-CM | POA: Diagnosis not present

## 2021-01-16 NOTE — Telephone Encounter (Signed)
   Notes to clinic: this script has expired  Review for continued use and refill    Requested Prescriptions  Pending Prescriptions Disp Refills   carvedilol (COREG) 12.5 MG tablet [Pharmacy Med Name: CARVEDILOL 12.5MG  TABLETS] 180 tablet 1    Sig: TAKE 1 TABLET(12.5 MG) BY MOUTH TWICE DAILY WITH A MEAL      Cardiovascular:  Beta Blockers Failed - 01/16/2021 11:21 AM      Failed - Valid encounter within last 6 months    Recent Outpatient Visits           11 months ago Type 2 diabetes mellitus with diabetic polyneuropathy, without long-term current use of insulin ([place])   [place], Charlane Ferretti, MD   1 year ago Tobacco use   [place], [place], MD   1 year ago Type 2 diabetes mellitus without complication, without long-term current use of insulin [facility])   [place], [place] W, NP   2 years ago Type 2 diabetes mellitus without complication, without long-term current use of insulin [facility])   [place], [place] W, NP   2 years ago Type 2 diabetes mellitus without complication, without long-term current use of insulin ([place])   [place], Vernia Buff, NP       Future Appointments             In 1 week Charlott Rakes, MD [place] BP in normal range    BP Readings from Last 1 Encounters:  01/11/21 130/66          Passed - Last Heart Rate in normal range    Pulse Readings from Last 1 Encounters:  01/11/21 71

## 2021-01-16 NOTE — Telephone Encounter (Signed)
Requested Prescriptions  Pending Prescriptions Disp Refills  . ACCU-CHEK AVIVA PLUS test strip [Pharmacy Med Name: [place] 100S] 100 strip 0    Sig: USE AS INSTRUCTED     Endocrinology: Diabetes - Testing Supplies Failed - 01/16/2021  3:50 AM      Failed - Valid encounter within last 12 months    Recent Outpatient Visits          11 months ago Type 2 diabetes mellitus with diabetic polyneuropathy, without long-term current use of insulin ([place])   [place], Charlane Ferretti, MD   1 year ago Tobacco use   [place], [place], MD   1 year ago Type 2 diabetes mellitus without complication, without long-term current use of insulin ([place])   [place], [place] W, NP   2 years ago Type 2 diabetes mellitus without complication, without long-term current use of insulin [facility])   [place], [place] W, NP   2 years ago Type 2 diabetes mellitus without complication, without long-term current use of insulin [facility])   [place], Vernia Buff, NP      Future Appointments            In 1 week Charlott Rakes, MD [place]

## 2021-01-17 ENCOUNTER — Other Ambulatory Visit: Payer: Self-pay | Admitting: Family Medicine

## 2021-01-17 DIAGNOSIS — I10 Essential (primary) hypertension: Secondary | ICD-10-CM

## 2021-01-17 NOTE — Telephone Encounter (Signed)
Requested Prescriptions  Pending Prescriptions Disp Refills  . carvedilol (COREG) 12.5 MG tablet [Pharmacy Med Name: CARVEDILOL 12.5MG  TABLETS] 180 tablet 0    Sig: TAKE 1 TABLET(12.5 MG) BY MOUTH TWICE DAILY WITH A MEAL     Cardiovascular:  Beta Blockers Failed - 01/17/2021  3:50 AM      Failed - Valid encounter within last 6 months    Recent Outpatient Visits          11 months ago Type 2 diabetes mellitus with diabetic polyneuropathy, without long-term current use of insulin ([place])   [place], Charlane Ferretti, MD   1 year ago Tobacco use   [place], [place], MD   1 year ago Type 2 diabetes mellitus without complication, without long-term current use of insulin [facility])   [place], [place] W, NP   2 years ago Type 2 diabetes mellitus without complication, without long-term current use of insulin [facility])   [place], [place] W, NP   2 years ago Type 2 diabetes mellitus without complication, without long-term current use of insulin ([place])   [place], Vernia Buff, NP      Future Appointments            In 1 week Charlott Rakes, MD [place] BP in normal range    BP Readings from Last 1 Encounters:  01/11/21 130/66         Passed - Last Heart Rate in normal range    Pulse Readings from Last 1 Encounters:  01/11/21 71

## 2021-01-24 ENCOUNTER — Other Ambulatory Visit: Payer: Self-pay

## 2021-01-24 ENCOUNTER — Ambulatory Visit: Payer: BC Managed Care – PPO | Attending: Family Medicine | Admitting: Family Medicine

## 2021-01-24 VITALS — BP 128/73 | HR 82 | Ht 66.5 in | Wt 161.6 lb

## 2021-01-24 DIAGNOSIS — M1712 Unilateral primary osteoarthritis, left knee: Secondary | ICD-10-CM

## 2021-01-24 DIAGNOSIS — I10 Essential (primary) hypertension: Secondary | ICD-10-CM

## 2021-01-24 DIAGNOSIS — I739 Peripheral vascular disease, unspecified: Secondary | ICD-10-CM | POA: Diagnosis not present

## 2021-01-24 DIAGNOSIS — E1142 Type 2 diabetes mellitus with diabetic polyneuropathy: Secondary | ICD-10-CM | POA: Diagnosis not present

## 2021-01-24 DIAGNOSIS — E1159 Type 2 diabetes mellitus with other circulatory complications: Secondary | ICD-10-CM | POA: Diagnosis not present

## 2021-01-24 DIAGNOSIS — Z1159 Encounter for screening for other viral diseases: Secondary | ICD-10-CM

## 2021-01-24 LAB — POCT GLYCOSYLATED HEMOGLOBIN (HGB A1C): Hemoglobin A1C: 6.2 % — AB (ref 4.0–5.6)

## 2021-01-24 LAB — GLUCOSE, POCT (MANUAL RESULT ENTRY): POC Glucose: 138 mg/dl — AB (ref 70–99)

## 2021-01-24 MED ORDER — ACETAMINOPHEN-CODEINE #3 300-30 MG PO TABS: 1.0000 | ORAL_TABLET | Freq: Two times a day (BID) | ORAL | 1 refills | Status: DC | PRN

## 2021-01-24 MED ORDER — ATORVASTATIN CALCIUM 10 MG PO TABS: 10.0000 mg | ORAL_TABLET | Freq: Every day | ORAL | 1 refills | Status: DC

## 2021-01-24 MED ORDER — CARVEDILOL 12.5 MG PO TABS: ORAL_TABLET | ORAL | 1 refills | Status: DC

## 2021-01-24 MED ORDER — HYDROCHLOROTHIAZIDE 25 MG PO TABS: 25.0000 mg | ORAL_TABLET | Freq: Every day | ORAL | 1 refills | Status: DC

## 2021-01-24 MED ORDER — GLIPIZIDE 10 MG PO TABS: 10.0000 mg | ORAL_TABLET | Freq: Every day | ORAL | 1 refills | Status: DC

## 2021-01-24 MED ORDER — AMLODIPINE BESYLATE 10 MG PO TABS
ORAL_TABLET | ORAL | 1 refills | Status: DC
Start: 2021-01-24 — End: 2021-01-27

## 2021-01-24 MED ORDER — GLUCOSE BLOOD VI STRP: ORAL_STRIP | 12 refills | Status: DC

## 2021-01-24 MED ORDER — METFORMIN HCL 1000 MG PO TABS: 1000.0000 mg | ORAL_TABLET | Freq: Two times a day (BID) | ORAL | 1 refills | Status: DC

## 2021-01-24 NOTE — Progress Notes (Signed)
Subjective:  Patient ID: Tammy Graham, female    DOB: [birth year]  Age: 70 y.o. MRN: [id number]  CC: Diabetes   HPI Tammy Graham is a 70 year old female with a history of coronary artery disease (status post DES), type 2 diabetes mellitus (A1c 6.2), hypertension, PAD, L knee OA, tobacco abuse who presents today for chronic disease management. She was recently diagnosed with PAD after vascular US with ABI revealed moderate RLE arterial disease. Previously seen at an outside hospital and diagnosed with total R SFA occlusion with failed intervention. She will be undergoing an angiogram at the end of the month.   Her Diabetes is controlled with no episodes of hypoglycemia, visual concerns but she has RLE tingling. She has had a visit to the Podiatrist for management of a R foot callus. For her left knee Osteoarthritis she is on Tylenol #3 as her pain causes her to limp and she was told she would require surgery which she is not ready for. She works at home [employer] and is constantly on her feet.  Past Medical History:  Diagnosis Date  . Anxiety   . Arthritis    shoulder and knees  . CAD (coronary artery disease)   . DM (diabetes mellitus) ([place])   . HTN (hypertension)   . Hyperlipemia   . Myocardial infarction ([place])   . Sleep apnea     Past Surgical History:  Procedure Laterality Date  . CARDIAC CATHETERIZATION    . COLONOSCOPY    . COLONOSCOPY  09/25/2011   Procedure: COLONOSCOPY;  Surgeon: Winfield Cunas., MD;  Location: [facility];  Service: Endoscopy;  Laterality: N/A;  . CORONARY STENT PLACEMENT     Ostial LAD stent in 2012  . Left knee surgery      Family History  Problem Relation Age of Onset  . Heart attack Mother   . Anesthesia problems Neg Hx   . Hypotension Neg Hx   . Malignant hyperthermia Neg Hx   . Pseudochol deficiency Neg Hx     Allergies  Allergen Reactions  . Benadryl [Diphenhydramine Hcl (Sleep)] Other (See Comments)    unknown  . Rosuvastatin  Hives    Patient is allergic to generic rosuvastatin. She says she is able to take branded Crestor.   . Simvastatin Other (See Comments)    unknown    Outpatient Medications Prior to Visit  Medication Sig Dispense Refill  . ACCU-CHEK AVIVA PLUS test strip USE AS INSTRUCTED 100 strip 0  . Accu-Chek FastClix Lancets MISC USE ONCE DAILY AS DIRECTED 102 each 3  . ACCU-CHEK GUIDE test strip USE AS INSTRUCTED 100 strip 1  . aspirin 81 MG tablet Take 1 tablet (81 mg total) by mouth daily. 90 tablet 3  . Blood Glucose Monitoring Suppl (ACCU-CHEK GUIDE ME) w/Device KIT 1 kit by Does not apply route daily. Use to check blood sugar daily. 1 kit 0  . Misc. Devices MISC Shower bench, Above the toilet seat.  Diagnosis- Type 2 Diabetes Mellitus 1 each 0  . Multiple Vitamin (MULTIVITAMIN WITH MINERALS) TABS tablet Take 1 tablet by mouth daily.    . nitroGLYCERIN (NITROSTAT) 0.4 MG SL tablet Place 0.4 mg under the tongue every 5 (five) minutes as needed for chest pain.    [place] acetaminophen-codeine (TYLENOL #3) 300-30 MG tablet TAKE 1 TABLET BY MOUTH EVERY 12 HOURS AS NEEDED FOR MODERATE PAIN 60 tablet 1  . amLODipine (NORVASC) 10 MG tablet TAKE 1 TABLET(10 MG) BY MOUTH DAILY(MUST  HAVE OFFICE VISIT FOR REFILLS) 30 tablet 0  . atorvastatin (LIPITOR) 10 MG tablet Take 1 tablet (10 mg total) by mouth daily. 30 tablet 3  . carvedilol (COREG) 12.5 MG tablet TAKE 1 TABLET(12.5 MG) BY MOUTH TWICE DAILY WITH A MEAL 180 tablet 0  . glipiZIDE (GLUCOTROL) 10 MG tablet TAKE 1 TABLET(10 MG) BY MOUTH DAILY BEFORE AND BREAKFAST 30 tablet 0  . hydrochlorothiazide (HYDRODIURIL) 25 MG tablet TAKE 1 TABLET(25 MG) BY MOUTH DAILY 90 tablet 0  . metFORMIN (GLUCOPHAGE) 1000 MG tablet TAKE 1 TABLET(1000 MG) BY MOUTH TWICE DAILY WITH A MEAL 180 tablet 1   No facility-administered medications prior to visit.     ROS Review of Systems  Constitutional: Negative for activity change, appetite change and fatigue.  HENT: Negative for  congestion, sinus pressure and sore throat.   Eyes: Negative for visual disturbance.  Respiratory: Negative for cough, chest tightness, shortness of breath and wheezing.   Cardiovascular: Negative for chest pain and palpitations.  Gastrointestinal: Negative for abdominal distention, abdominal pain and constipation.  Endocrine: Negative for polydipsia.  Genitourinary: Negative for dysuria and frequency.  Musculoskeletal:       See HPI  Skin: Negative for rash.  Neurological: Positive for numbness. Negative for tremors and light-headedness.  Hematological: Does not bruise/bleed easily.  Psychiatric/Behavioral: Negative for agitation and behavioral problems.    Objective:  BP 128/73   Pulse 82   Ht 5' 6.5" (1.689 m)   Wt 161 lb 9.6 oz (73.3 kg)   SpO2 98%   BMI 25.69 kg/m   BP/Weight 01/24/2021 01/11/2021 0/30/0923  Systolic BP 300 762 263  Diastolic BP 73 66 76  Wt. (Lbs) 161.6 161 164  BMI 25.69 25.22 25.69      Physical Exam Constitutional:      Appearance: She is well-developed.  Neck:     Vascular: No JVD.  Cardiovascular:     Rate and Rhythm: Normal rate.     Pulses:          Dorsalis pedis pulses are 0 on the right side and 2+ on the left side.     Heart sounds: Normal heart sounds. No murmur heard.   Pulmonary:     Effort: Pulmonary effort is normal.     Breath sounds: Normal breath sounds. No wheezing or rales.  Chest:     Chest wall: No tenderness.  Abdominal:     General: Bowel sounds are normal. There is no distension.     Palpations: Abdomen is soft. There is no mass.     Tenderness: There is no abdominal tenderness.  Musculoskeletal:        General: Normal range of motion.     Right lower leg: No edema.     Left lower leg: No edema.  Neurological:     Mental Status: She is alert and oriented to person, place, and time.  Psychiatric:        Mood and Affect: Mood normal.    Diabetic Foot Exam - Simple   Simple Foot Form Diabetic Foot exam was  performed with the following findings: Yes 01/24/2021 10:36 AM  Visual Inspection See comments: Yes Sensation Testing Intact to touch and monofilament testing bilaterally: Yes Pulse Check See comments: Yes Comments Hyperpigmented callus with central depression on lateral aspect of right foot.  Left foot is normal. Absent dorsalis pedis and posterior tibialis on the right foot but normal in the left.      CMP Latest Ref Rng &  Units 02/21/2020 07/01/2019 11/23/2018  Glucose 65 - 99 mg/dL 91 90 83  BUN 8 - 27 mg/dL _0 Creatinine 0.57 - 1.00 mg/dL 1.17(H) 1.00 1.10(H)  Sodium 134 - 144 mmol/L 141 142 143  Potassium 3.5 - 5.2 mmol/L 4.2 4.0 3.8  Chloride 96 - 106 mmol/L 105 101 102  CO2 20 - 29 mmol/L _1 Calcium 8.7 - 10.3 mg/dL 10.2 10.5(H) 10.4(H)  Total Protein 6.0 - 8.5 g/dL 6.7 - 6.8  Total Bilirubin 0.0 - 1.2 mg/dL 0.7 - 0.4  Alkaline Phos 39 - 117 IU/L 89 - 80  AST 0 - 40 IU/L 18 - 18  ALT 0 - 32 IU/L 15 - 17    Lipid Panel     Component Value Date/Time   CHOL 184 07/01/2019 1550   TRIG 206 (H) 07/01/2019 1550   HDL 47 07/01/2019 1550   CHOLHDL 3.9 07/01/2019 1550   CHOLHDL 3.1 05/04/2014 0919   VLDL 18 05/04/2014 0919   LDLCALC 102 (H) 07/01/2019 1550    CBC    Component Value Date/Time   WBC 7.4 07/01/2019 1550   WBC 8.2 09/11/2016 2316   RBC 4.73 07/01/2019 1550   RBC 4.48 09/11/2016 2316   HGB 15.3 07/01/2019 1550   HCT 46.5 07/01/2019 1550   PLT 257 07/01/2019 1550   MCV 98 (H) 07/01/2019 1550   MCH 32.3 07/01/2019 1550   MCH 32.8 09/11/2016 2316   MCHC 32.9 07/01/2019 1550   MCHC 34.9 09/11/2016 2316   RDW 12.6 07/01/2019 1550   LYMPHSABS 2.9 09/11/2016 2316   MONOABS 0.4 09/11/2016 2316   EOSABS 0.2 09/11/2016 2316   BASOSABS 0.0 09/11/2016 2316    Lab Results  Component Value Date   HGBA1C 6.2 (A) 01/24/2021    Assessment & Plan:  1. Type 2 diabetes mellitus with diabetic polyneuropathy, without long-term current use of insulin  (HCC) Controlled with A1c of 6.2 Counseled on Diabetic diet, my plate method, 924 minutes of moderate intensity exercise/week Blood sugar logs with fasting goals of 80-120 mg/dl, random of less than 180 and in the event of sugars less than 60 mg/dl or greater than 400 mg/dl encouraged to notify the clinic. Advised on the need for annual eye exams, annual foot exams, Pneumonia vaccine. - POCT glycosylated hemoglobin (Hb A1C) - POCT glucose (manual entry) - Microalbumin / creatinine urine ratio; Future - CMP14+EGFR; Future - Lipid panel; Future  2. Essential hypertension Controlled Counseled on blood pressure goal of less than 130/80, low-sodium, DASH diet, medication compliance, 150 minutes of moderate intensity exercise per week. Discussed medication compliance, adverse effects. - amLODipine (NORVASC) 10 MG tablet; TAKE 1 TABLET(10 MG) BY MOUTH DAILY(MUST HAVE OFFICE VISIT FOR REFILLS)  Dispense: 90 tablet; Refill: 1 - carvedilol (COREG) 12.5 MG tablet; TAKE 1 TABLET(12.5 MG) BY MOUTH TWICE DAILY WITH A MEAL  Dispense: 180 tablet; Refill: 1 - hydrochlorothiazide (HYDRODIURIL) 25 MG tablet; Take 1 tablet (25 mg total) by mouth daily.  Dispense: 90 tablet; Refill: 1  3. Type 2 diabetes mellitus with other circulatory complication, without long-term current use of insulin ([place]) See #1 above - glipiZIDE (GLUCOTROL) 10 MG tablet; Take 1 tablet (10 mg total) by mouth daily before breakfast.  Dispense: 90 tablet; Refill: 1 - metFORMIN (GLUCOPHAGE) 1000 MG tablet; Take 1 tablet (1,000 mg total) by mouth 2 (two) times daily with a meal.  Dispense: 180 tablet; Refill: 1  4. Need for hepatitis C screening test -  HCV RNA quant rflx ultra or genotyp([facility]); Future  5. Peripheral arterial disease ([place]) With R LE tngling Upcoming appointment for Angiogram  6. Osteoarthritis of left knee, unspecified osteoarthritis type Uses Tylenol #3 for pain    Meds ordered this encounter   Medications  . glucose blood test strip    Sig: Use as instructed    Dispense:  100 each    Refill:  12    One touch ultra mini  . amLODipine (NORVASC) 10 MG tablet    Sig: TAKE 1 TABLET(10 MG) BY MOUTH DAILY(MUST HAVE OFFICE VISIT FOR REFILLS)    Dispense:  90 tablet    Refill:  1  . atorvastatin (LIPITOR) 10 MG tablet    Sig: Take 1 tablet (10 mg total) by mouth daily.    Dispense:  90 tablet    Refill:  1  . carvedilol (COREG) 12.5 MG tablet    Sig: TAKE 1 TABLET(12.5 MG) BY MOUTH TWICE DAILY WITH A MEAL    Dispense:  180 tablet    Refill:  1  . glipiZIDE (GLUCOTROL) 10 MG tablet    Sig: Take 1 tablet (10 mg total) by mouth daily before breakfast.    Dispense:  90 tablet    Refill:  1  . hydrochlorothiazide (HYDRODIURIL) 25 MG tablet    Sig: Take 1 tablet (25 mg total) by mouth daily.    Dispense:  90 tablet    Refill:  1  . metFORMIN (GLUCOPHAGE) 1000 MG tablet    Sig: Take 1 tablet (1,000 mg total) by mouth 2 (two) times daily with a meal.    Dispense:  180 tablet    Refill:  1    **Patient requests 90 days supply**  . acetaminophen-codeine (TYLENOL #3) 300-30 MG tablet    Sig: Take 1 tablet by mouth every 12 (twelve) hours as needed for moderate pain.    Dispense:  60 tablet    Refill:  1    Follow-up: Return in about 6 months (around 07/26/2021) for Medical conditions.       Charlott Rakes, MD, FAAFP. [facility] [place], [place]   01/24/2021, 5:53 PM

## 2021-01-24 NOTE — Progress Notes (Signed)
Medication refill.... ACCU-CHECK Meter not working

## 2021-01-24 NOTE — Patient Instructions (Signed)
Chronic Knee Pain, Adult Knee pain that lasts longer than 3 months is called chronic knee pain. You may have pain in one or both knees. Symptoms of chronic knee pain may also include swelling and stiffness. The most common cause is age-related wear and tear (osteoarthritis) of your knee joint. Many conditions can cause chronic knee pain. Treatment depends on the cause. The main treatments are physical therapy and weight loss. It may also be treated with medicines, injections, a knee sleeve or brace, and by using crutches. Rest, ice, pressure (compression), and elevation, also known as RICE therapy, may also be recommended. Follow these instructions at home: If you have a knee sleeve or brace:  Wear the knee sleeve or brace as told by your doctor. Take it off only as told by your doctor.  Loosen it if your toes: ? Tingle. ? Become numb. ? Turn cold and blue.  Keep it clean.  If the sleeve or brace is not waterproof: ? Do not let it get wet. ? Ask your doctor if you may take it off when you take a bath or shower. If not, cover it with a watertight covering.   Managing pain, stiffness, and swelling  If told, put heat on your knee. Do this as often as told by your doctor. Use the heat source that your doctor recommends, such as a moist heat pack or a heating pad. ? If you have a removable knee sleeve or brace, take it off as told by your doctor. ? Place a towel between your skin and the heat source. ? Leave the heat on for 20-30 minutes. ? Take off the heat if your skin turns bright red. This is very important. If you cannot feel pain, heat, or cold, you have a greater risk of getting burned.  If told, put ice on your knee. To do this: ? If you have a removable knee sleeve or brace, take it off as told by your doctor. ? Put ice in a plastic bag. ? Place a towel between your skin and the bag. ? Leave the ice on for 20 minutes, 2-3 times a day. ? Take off the ice if your skin turns bright  red. This is very important. If you cannot feel pain, heat, or cold, you have a greater risk of damage to the area.  Move your toes often.  Raise the injured area above the level of your heart while you are sitting or lying down.      Activity  Avoid activities where both feet leave the ground at the same time (high-impact activities). Examples are running, jumping rope, and doing jumping jacks.  Follow the exercise plan that your doctor makes for you. Your doctor may suggest that you: ? Avoid activities that make knee pain worse. You may need to change the exercises that you do, the sports that you participate in, or your job duties. ? Wear shoes with cushioned soles. ? Avoid sports that require running and sudden changes in direction. ? Do exercises or physical therapy. This is planned to match your needs and your abilities. ? Do exercises that increase your balance and strength, such as tai chi and yoga.  Do not use your injured knee to support your body weight until your doctor says that you can. Use crutches as told by your doctor.  Return to your normal activities when your doctor says that it is safe. General instructions  Take over-the-counter and prescription medicines only as told by your   doctor.  If you are overweight, work with your doctor and a food expert (dietitian) to set goals to lose weight. Being overweight can make your knee hurt more.  Do not smoke or use any products that contain nicotine or tobacco. If you need help quitting, ask your doctor.  Keep all follow-up visits. Contact a doctor if:  You have knee pain that is not getting better or gets worse.  You are not able to do your exercises due to knee pain. Get help right away if:  Your knee swells and the swelling gets worse.  You cannot move your knee.  You have very bad knee pain. Summary  Knee pain that lasts more than 3 months is called chronic knee pain.  The main treatments for chronic knee  pain are physical therapy and weight loss. You may also need to take medicines, wear a knee sleeve or brace, use crutches, and put ice or heat on your knee.  Lose weight if you are overweight. Work with your doctor and a food expert (dietitian) to help you set goals to lose weight. Being overweight can make your knee hurt more.  Follow the exercise plan that your doctor makes for you. This information is not intended to replace advice given to you by your health care provider. Make sure you discuss any questions you have with your health care provider. Document Revised: 03/21/2020 Document Reviewed: 03/21/2020 Elsevier Patient Education  2021 Elsevier Inc.  

## 2021-01-27 ENCOUNTER — Other Ambulatory Visit: Payer: Self-pay | Admitting: Family Medicine

## 2021-01-27 DIAGNOSIS — I10 Essential (primary) hypertension: Secondary | ICD-10-CM

## 2021-01-27 DIAGNOSIS — E1159 Type 2 diabetes mellitus with other circulatory complications: Secondary | ICD-10-CM

## 2021-01-27 NOTE — Telephone Encounter (Signed)
Requested Prescriptions  Pending Prescriptions Disp Refills  . glipiZIDE (GLUCOTROL) 10 MG tablet [Pharmacy Med Name: GLIPIZIDE 10MG  TABLETS] 30 tablet 0    Sig: TAKE 1 TABLET(10 MG) BY MOUTH DAILY BEFORE BREAKFAST     Endocrinology:  Diabetes - Sulfonylureas Passed - 01/27/2021  3:50 AM      Passed - HBA1C is between 0 and 7.9 and within 180 days    Hemoglobin A1C  Date Value Ref Range Status  01/24/2021 6.2 (A) 4.0 - 5.6 % Final   HbA1c, POC (controlled diabetic range)  Date Value Ref Range Status  01/03/2020 6.8 0.0 - 7.0 % Final         Passed - Valid encounter within last 6 months    Recent Outpatient Visits          3 days ago Type 2 diabetes mellitus with diabetic polyneuropathy, without long-term current use of insulin ([place])   [place], [place], MD   11 months ago Type 2 diabetes mellitus with diabetic polyneuropathy, without long-term current use of insulin ([place])   [place], Charlane Ferretti, MD   1 year ago Tobacco use   [place], [place], MD   1 year ago Type 2 diabetes mellitus without complication, without long-term current use of insulin [id number])   [place], [place] W, NP   2 years ago Type 2 diabetes mellitus without complication, without long-term current use of insulin [facility])   [place], Vernia Buff, NP      Future Appointments            In 4 months Charlott Rakes, MD [place]           . amLODipine (NORVASC) 10 MG tablet [Pharmacy Med Name: AMLODIPINE BESYLATE 10MG  TABLETS] 30 tablet 0    Sig: TAKE 1 TABLET(10 MG) BY MOUTH DAILY     Cardiovascular:  Calcium Channel Blockers Passed - 01/27/2021  3:50 AM      Passed - Last BP in normal range    BP Readings from Last 1 Encounters:  01/24/21 128/73         Passed - Valid encounter within  last 6 months    Recent Outpatient Visits          3 days ago Type 2 diabetes mellitus with diabetic polyneuropathy, without long-term current use of insulin ([place])   [place], [place], MD   11 months ago Type 2 diabetes mellitus with diabetic polyneuropathy, without long-term current use of insulin ([place])   [place], Charlane Ferretti, MD   1 year ago Tobacco use   [place], [place], MD   1 year ago Type 2 diabetes mellitus without complication, without long-term current use of insulin [facility])   [place], [place] W, NP   2 years ago Type 2 diabetes mellitus without complication, without long-term current use of insulin [facility])   [place], Vernia Buff, NP      Future Appointments            In 4 months Charlott Rakes, MD [place] term fill /change of pharmacy

## 2021-02-08 ENCOUNTER — Other Ambulatory Visit (HOSPITAL_COMMUNITY)
Admission: RE | Admit: 2021-02-08 | Discharge: 2021-02-08 | Disposition: A | Discharge status: Home / Self Care | Payer: BC Managed Care – PPO | Source: Ambulatory Visit | Attending: Vascular Surgery | Admitting: Vascular Surgery

## 2021-02-08 DIAGNOSIS — Z01812 Encounter for preprocedural laboratory examination: Secondary | ICD-10-CM | POA: Insufficient documentation

## 2021-02-08 DIAGNOSIS — Z20822 Contact with and (suspected) exposure to covid-19: Secondary | ICD-10-CM | POA: Insufficient documentation

## 2021-02-09 LAB — SARS CORONAVIRUS 2 (TAT 6-24 HRS): SARS Coronavirus 2: NEGATIVE

## 2021-02-11 ENCOUNTER — Ambulatory Visit (HOSPITAL_COMMUNITY)
Admission: RE | Admit: 2021-02-11 | Discharge: 2021-02-11 | Disposition: A | Discharge status: Home / Self Care | Payer: BC Managed Care – PPO | Attending: Vascular Surgery | Admitting: Vascular Surgery

## 2021-02-11 ENCOUNTER — Other Ambulatory Visit: Payer: Self-pay

## 2021-02-11 ENCOUNTER — Telehealth: Payer: Self-pay | Admitting: Cardiovascular Disease

## 2021-02-11 ENCOUNTER — Encounter (HOSPITAL_COMMUNITY)
Admission: RE | Disposition: A | Discharge status: Home / Self Care | Payer: Self-pay | Source: Home / Self Care | Attending: Vascular Surgery

## 2021-02-11 ENCOUNTER — Ambulatory Visit (HOSPITAL_BASED_OUTPATIENT_CLINIC_OR_DEPARTMENT_OTHER): Payer: BC Managed Care – PPO

## 2021-02-11 ENCOUNTER — Encounter (HOSPITAL_COMMUNITY): Payer: Self-pay | Admitting: Vascular Surgery

## 2021-02-11 DIAGNOSIS — Z7982 Long term (current) use of aspirin: Secondary | ICD-10-CM | POA: Insufficient documentation

## 2021-02-11 DIAGNOSIS — Z79899 Other long term (current) drug therapy: Secondary | ICD-10-CM | POA: Insufficient documentation

## 2021-02-11 DIAGNOSIS — E785 Hyperlipidemia, unspecified: Secondary | ICD-10-CM | POA: Insufficient documentation

## 2021-02-11 DIAGNOSIS — Z01810 Encounter for preprocedural cardiovascular examination: Secondary | ICD-10-CM | POA: Diagnosis not present

## 2021-02-11 DIAGNOSIS — Z7984 Long term (current) use of oral hypoglycemic drugs: Secondary | ICD-10-CM | POA: Insufficient documentation

## 2021-02-11 DIAGNOSIS — I70211 Atherosclerosis of native arteries of extremities with intermittent claudication, right leg: Secondary | ICD-10-CM | POA: Insufficient documentation

## 2021-02-11 DIAGNOSIS — F17210 Nicotine dependence, cigarettes, uncomplicated: Secondary | ICD-10-CM | POA: Insufficient documentation

## 2021-02-11 DIAGNOSIS — I2510 Atherosclerotic heart disease of native coronary artery without angina pectoris: Secondary | ICD-10-CM | POA: Diagnosis not present

## 2021-02-11 DIAGNOSIS — I10 Essential (primary) hypertension: Secondary | ICD-10-CM | POA: Diagnosis not present

## 2021-02-11 DIAGNOSIS — E1151 Type 2 diabetes mellitus with diabetic peripheral angiopathy without gangrene: Secondary | ICD-10-CM | POA: Diagnosis not present

## 2021-02-11 DIAGNOSIS — Z888 Allergy status to other drugs, medicaments and biological substances status: Secondary | ICD-10-CM | POA: Insufficient documentation

## 2021-02-11 DIAGNOSIS — Z955 Presence of coronary angioplasty implant and graft: Secondary | ICD-10-CM | POA: Diagnosis not present

## 2021-02-11 HISTORY — PX: ABDOMINAL AORTOGRAM W/LOWER EXTREMITY: CATH118223

## 2021-02-11 LAB — POCT I-STAT, CHEM 8
BUN: 18 mg/dL (ref 8–23)
Calcium, Ion: 1.25 mmol/L (ref 1.15–1.40)
Chloride: 104 mmol/L (ref 98–111)
Creatinine, Ser: 1 mg/dL (ref 0.44–1.00)
Glucose, Bld: 140 mg/dL — ABNORMAL HIGH (ref 70–99)
HCT: 40 % (ref 36.0–46.0)
Hemoglobin: 13.6 g/dL (ref 12.0–15.0)
Potassium: 4 mmol/L (ref 3.5–5.1)
Sodium: 142 mmol/L (ref 135–145)
TCO2: 31 mmol/L (ref 22–32)

## 2021-02-11 LAB — GLUCOSE, CAPILLARY: Glucose-Capillary: 111 mg/dL — ABNORMAL HIGH (ref 70–99)

## 2021-02-11 SURGERY — ABDOMINAL AORTOGRAM W/LOWER EXTREMITY
Anesthesia: LOCAL | Laterality: Bilateral

## 2021-02-11 MED ORDER — FENTANYL CITRATE (PF) 100 MCG/2ML IJ SOLN
INTRAMUSCULAR | Status: AC
  Filled 2021-02-11: qty 2

## 2021-02-11 MED ORDER — ONDANSETRON HCL 4 MG/2ML IJ SOLN: 4.0000 mg | Freq: Four times a day (QID) | INTRAMUSCULAR | Status: DC | PRN

## 2021-02-11 MED ORDER — ACETAMINOPHEN 325 MG PO TABS: 650.0000 mg | ORAL_TABLET | ORAL | Status: DC | PRN

## 2021-02-11 MED ORDER — IODIXANOL 320 MG/ML IV SOLN
INTRAVENOUS | Status: DC | PRN
  Administered 2021-02-11: 97 mL via INTRA_ARTERIAL

## 2021-02-11 MED ORDER — HEPARIN (PORCINE) IN NACL 1000-0.9 UT/500ML-% IV SOLN
INTRAVENOUS | Status: AC
  Filled 2021-02-11: qty 500

## 2021-02-11 MED ORDER — SODIUM CHLORIDE 0.9% FLUSH: 3.0000 mL | INTRAVENOUS | Status: DC | PRN

## 2021-02-11 MED ORDER — FENTANYL CITRATE (PF) 100 MCG/2ML IJ SOLN
INTRAMUSCULAR | Status: DC | PRN
  Administered 2021-02-11: 50 ug via INTRAVENOUS

## 2021-02-11 MED ORDER — HEPARIN (PORCINE) IN NACL 1000-0.9 UT/500ML-% IV SOLN
INTRAVENOUS | Status: DC | PRN
  Administered 2021-02-11 (×2): 500 mL

## 2021-02-11 MED ORDER — LABETALOL HCL 5 MG/ML IV SOLN: 10.0000 mg | INTRAVENOUS | Status: DC | PRN

## 2021-02-11 MED ORDER — LIDOCAINE HCL (PF) 1 % IJ SOLN
INTRAMUSCULAR | Status: DC | PRN
  Administered 2021-02-11: 15 mL

## 2021-02-11 MED ORDER — MIDAZOLAM HCL 2 MG/2ML IJ SOLN
INTRAMUSCULAR | Status: AC
  Filled 2021-02-11: qty 2

## 2021-02-11 MED ORDER — SODIUM CHLORIDE 0.9 % IV SOLN: INTRAVENOUS | Status: DC

## 2021-02-11 MED ORDER — SODIUM CHLORIDE 0.9 % IV SOLN: 250.0000 mL | INTRAVENOUS | Status: DC | PRN

## 2021-02-11 MED ORDER — MIDAZOLAM HCL 2 MG/2ML IJ SOLN
INTRAMUSCULAR | Status: DC | PRN
  Administered 2021-02-11: 1 mg via INTRAVENOUS

## 2021-02-11 MED ORDER — SODIUM CHLORIDE 0.9% FLUSH: 3.0000 mL | Freq: Two times a day (BID) | INTRAVENOUS | Status: DC

## 2021-02-11 MED ORDER — HYDRALAZINE HCL 20 MG/ML IJ SOLN: 5.0000 mg | INTRAMUSCULAR | Status: DC | PRN

## 2021-02-11 MED ORDER — LIDOCAINE HCL (PF) 1 % IJ SOLN
INTRAMUSCULAR | Status: AC
  Filled 2021-02-11: qty 30

## 2021-02-11 SURGICAL SUPPLY — 10 items
CATH OMNI FLUSH 5F 65CM (CATHETERS) ×2 IMPLANT
CLOSURE MYNX CONTROL 5F (Vascular Products) ×2 IMPLANT
KIT MICROPUNCTURE NIT STIFF (SHEATH) ×2 IMPLANT
KIT PV (KITS) ×2 IMPLANT
SHEATH PINNACLE 5F 10CM (SHEATH) ×2 IMPLANT
SHEATH PROBE COVER 6X72 (BAG) ×2 IMPLANT
SYR MEDRAD MARK V 150ML (SYRINGE) ×2 IMPLANT
TRANSDUCER W/STOPCOCK (MISCELLANEOUS) ×2 IMPLANT
TRAY PV CATH (CUSTOM PROCEDURE TRAY) ×2 IMPLANT
WIRE BENTSON .035X145CM (WIRE) ×2 IMPLANT

## 2021-02-11 NOTE — Telephone Encounter (Signed)
   [facility] Pre-operative Risk Assessment    Request for surgical clearance:  1. What type of surgery is being performed? Right columon semioral to above knee popliteal artery bypass  2. When is this surgery scheduled? May 9  3. What type of clearance is required (medical clearance vs. Pharmacy clearance to hold med vs. Both)? TBD  4. Are there any medications that need to be held prior to surgery and how long? TBD  5. Practice name and name of physician performing surgery? Dr. Servando Snare  6. What is your office phone number [phone]    7.   What is your office fax number [phone]  8.   Anesthesia type (None, local, MAC, general) ? TBD   Tammy Graham 02/11/2021, 2:13 PM  _________________________________________________________________   (provider comments below)

## 2021-02-11 NOTE — Op Note (Signed)
    Patient name: Tammy Graham MRN: [id number] DOB: Apr 21, 1951 Sex: female  02/11/2021 Pre-operative Diagnosis: Right lower extremity claudication Post-operative diagnosis:  Same Surgeon:  Erlene Quan C. Donzetta Matters, MD Procedure Performed: 1.  Ultrasound-guided cannulation left common femoral artery 2.  Aortogram with bilateral extremity runoff 3.  Moderate sedation with fentanyl and Versed for 25 minutes 4.  mynx device closure left common femoral artery   Indications: 70 year old female with life limiting claudication right lower extremity.  She is undergone attempted endovascular invention with [facility] vascular.  She is now indicated for angiography with possible intervention.  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  There is a small left common iliac artery aneurysm.  Bilateral renal arteries are patent.  She has tortuosity of bilateral external iliac arteries.  The left side she has patent SFA proximally 30% stenosis of the distal SFA three-vessel runoff to the foot.  The right side which is the side of interest has a very diminutive proximal SFA which frankly occludes in the upper thigh reconstitutes above the knee popliteal artery with dominant runoff via the posterior tibial artery on the right.  There appears to also be patency of her anterior tibial peroneal arteries proximally.  Patient will be scheduled for right common femoral to above-knee popliteal artery bypass.  Vein mapping will be performed prior to discharge today.   Procedure:  The patient was identified in the holding area and taken to room 8.  The patient was then placed supine on the table and prepped and draped in the usual sterile fashion.  A time out was called.  Ultrasound was used to evaluate the left common femoral artery.  This artery was noted to be patent and compressible.  There is no spasm percent lidocaine cannulated with direct ultrasound visualization with micropuncture needle followed by wire sheath.  Images saved  the permanent record.  Bentson wires placed followed by 5 [place] sheath.  Omni catheter was placed to the level of L1 aortogram was performed followed by lower extremity runoff.  With the above findings we elected for bypass surgery.  We remove the catheter over a wire.  Minx device was deployed.  She tolerated procedure well without any complication.   Contrast: 97cc  Donesha Wallander C. Donzetta Matters, MD [facility] [place] Office: [phone] Pager: [phone]
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
## 2021-02-11 NOTE — Discharge Instructions (Signed)
DRINK PLENTY OF FLUIDS OVER THE NEXT 2-3 DAYS. Femoral Site Care  This sheet gives you information about how to care for yourself after your procedure. Your health care provider may also give you more specific instructions. If you have problems or questions, contact your health care provider. What can I expect after the procedure? After the procedure, it is common to have:  Bruising that usually fades within 1-2 weeks.  Tenderness at the site. Follow these instructions at home: Wound care  Follow instructions from your health care provider about how to take care of your insertion site. Make sure you: ? Wash your hands with soap and water before you change your bandage (dressing). If soap and water are not available, use hand sanitizer. ? Change your dressing as told by your health care provider. ? Leave stitches (sutures), skin glue, or adhesive strips in place. These skin closures may need to stay in place for 2 weeks or longer. If adhesive strip edges start to loosen and curl up, you may trim the loose edges. Do not remove adhesive strips completely unless your health care provider tells you to do that.  Do not take baths, swim, or use a hot tub until your health care provider approves.  You may shower 24-48 hours after the procedure or as told by your health care provider. ? Gently wash the site with plain soap and water. ? Pat the area dry with a clean towel. ? Do not rub the site. This may cause bleeding.  Do not apply powder or lotion to the site. Keep the site clean and dry.  Check your femoral site every day for signs of infection. Check for: ? Redness, swelling, or pain. ? Fluid or blood. ? Warmth. ? Pus or a bad smell. Activity  For the first 2-3 days after your procedure, or as long as directed: ? Avoid climbing stairs as much as possible. ? Do not squat.  Do not lift anything that is heavier than 10 lb (4.5 kg), or the limit that you are told, until your health  care provider says that it is safe.  Rest as directed. ? Avoid sitting for a long time without moving. Get up to take short walks every 1-2 hours.  Do not drive for 24 hours if you were given a medicine to help you relax (sedative). General instructions  Take over-the-counter and prescription medicines only as told by your health care provider.  Keep all follow-up visits as told by your health care provider. This is important. Contact a health care provider if you have:  A fever or chills.  You have redness, swelling, or pain around your insertion site. Get help right away if:  The catheter insertion area swells very fast.  You pass out.  You suddenly start to sweat or your skin gets clammy.  The catheter insertion area is bleeding, and the bleeding does not stop when you hold steady pressure on the area.  The area near or just beyond the catheter insertion site becomes pale, cool, tingly, or numb. These symptoms may represent a serious problem that is an emergency. Do not wait to see if the symptoms will go away. Get medical help right away. Call your local emergency services (911 in the [place] Do not drive yourself to the hospital. Summary  After the procedure, it is common to have bruising that usually fades within 1-2 weeks.  Check your femoral site every day for signs of infection.  Do not lift anything   that is heavier than 10 lb (4.5 kg), or the limit that you are told, until your health care provider says that it is safe. This information is not intended to replace advice given to you by your health care provider. Make sure you discuss any questions you have with your health care provider. Document Revised: 06/08/2020 Document Reviewed: 06/08/2020 Elsevier Patient Education  2021 Elsevier Inc.  

## 2021-02-11 NOTE — Progress Notes (Signed)
Right lower extremity vein mapping completed. Refer to "CV Proc" under chart review to view preliminary results.  02/11/2021 10:07 AM Kelby Aline., MHA, RVT, RDCS, RDMS

## 2021-02-11 NOTE — Progress Notes (Signed)
Pt refused to wait for [name] to wheel her out. Stated Im not waiting. She walked out even after being told to wait.

## 2021-02-12 NOTE — Telephone Encounter (Signed)
Based on Dr. Camillia Herter last note, "If she needs to have fem/pop bypass, will need to have a stress test but will wait to arrange that"  Patient is pending arterial bypass surgery in the lower extremity.   I have called and spoke with Tammy Graham who is agreeable to proceed with nuclear stress test.  Will send this to the callback pool to arrange nuclear stress test prior to the upcoming bypass surgery on 02/25/2021.

## 2021-02-13 ENCOUNTER — Telehealth (HOSPITAL_COMMUNITY): Payer: Self-pay

## 2021-02-13 ENCOUNTER — Other Ambulatory Visit: Payer: Self-pay | Admitting: *Deleted

## 2021-02-13 DIAGNOSIS — R079 Chest pain, unspecified: Secondary | ICD-10-CM

## 2021-02-13 NOTE — Telephone Encounter (Signed)
Will fax to requesting surgeons office via the epic fax function to make them aware and remove from preop pool.

## 2021-02-13 NOTE — Telephone Encounter (Signed)
Thanks!    Tammy Graham.

## 2021-02-13 NOTE — Telephone Encounter (Signed)
Nuclear stress test schedule for 02/19/2021 at 10:45 AM.

## 2021-02-14 ENCOUNTER — Other Ambulatory Visit: Payer: Self-pay

## 2021-02-19 ENCOUNTER — Other Ambulatory Visit: Payer: Self-pay

## 2021-02-19 ENCOUNTER — Encounter (HOSPITAL_COMMUNITY): Payer: BC Managed Care – PPO | Attending: Internal Medicine

## 2021-02-19 VITALS — Ht 66.5 in | Wt 167.0 lb

## 2021-02-19 DIAGNOSIS — R110 Nausea: Secondary | ICD-10-CM | POA: Diagnosis not present

## 2021-02-19 DIAGNOSIS — R079 Chest pain, unspecified: Secondary | ICD-10-CM | POA: Diagnosis not present

## 2021-02-19 MED ORDER — TECHNETIUM TC 99M TETROFOSMIN IV KIT
8.2000 | PACK | Freq: Once | INTRAVENOUS | Status: AC | PRN
  Administered 2021-02-19: 8.2 via INTRAVENOUS
  Filled 2021-02-19: qty 9

## 2021-02-20 ENCOUNTER — Ambulatory Visit (HOSPITAL_COMMUNITY): Payer: BC Managed Care – PPO | Attending: Internal Medicine

## 2021-02-20 LAB — MYOCARDIAL PERFUSION IMAGING
LV dias vol: 67 mL (ref 46–106)
LV sys vol: 19 mL
Peak HR: 99 {beats}/min
Rest HR: 71 {beats}/min
SDS: 4
SRS: 1
SSS: 5
TID: 1.05

## 2021-02-20 MED ORDER — AMINOPHYLLINE 25 MG/ML IV SOLN
75.0000 mg | Freq: Once | INTRAVENOUS | Status: AC
  Administered 2021-02-20: 75 mg via INTRAVENOUS

## 2021-02-20 MED ORDER — TECHNETIUM TC 99M TETROFOSMIN IV KIT
32.9000 | PACK | Freq: Once | INTRAVENOUS | Status: AC | PRN
  Administered 2021-02-20: 32.9 via INTRAVENOUS
  Filled 2021-02-20: qty 33

## 2021-02-20 MED ORDER — REGADENOSON 0.4 MG/5ML IV SOLN
0.4000 mg | Freq: Once | INTRAVENOUS | Status: AC
  Administered 2021-02-20: 0.4 mg via INTRAVENOUS

## 2021-02-21 NOTE — Telephone Encounter (Signed)
    [name] DOB:  1951-04-19  MRN:  [id number]   Primary Cardiologist: Lauree Chandler, MD  Chart reviewed as part of pre-operative protocol coverage. Patient underwent NST 02/20/21 which showed no evidence of ischemia or infarction. Patient contacted with these results and denies any change in symptoms since her last phone call. Given past medical history and time since last visit, based on ACC/AHA guidelines, Tammy Graham would be at acceptable risk for the planned procedure without further cardiovascular testing.   The patient was advised that if she develops new symptoms prior to surgery to contact our office to arrange for a follow-up visit, and she verbalized understanding.  I will route this recommendation to the requesting party via Epic fax function and remove from pre-op pool.  Please call with questions.  Abigail Butts, PA-C 02/21/2021, 10:23 AM

## 2021-02-25 ENCOUNTER — Other Ambulatory Visit (HOSPITAL_COMMUNITY)
Admission: RE | Admit: 2021-02-25 | Discharge: 2021-02-25 | Disposition: A | Discharge status: Home / Self Care | Payer: BC Managed Care – PPO | Source: Ambulatory Visit | Attending: Vascular Surgery | Admitting: Vascular Surgery

## 2021-02-25 DIAGNOSIS — I2510 Atherosclerotic heart disease of native coronary artery without angina pectoris: Secondary | ICD-10-CM | POA: Diagnosis not present

## 2021-02-25 DIAGNOSIS — Z20822 Contact with and (suspected) exposure to covid-19: Secondary | ICD-10-CM | POA: Diagnosis not present

## 2021-02-25 DIAGNOSIS — Z7984 Long term (current) use of oral hypoglycemic drugs: Secondary | ICD-10-CM | POA: Diagnosis not present

## 2021-02-25 DIAGNOSIS — E1151 Type 2 diabetes mellitus with diabetic peripheral angiopathy without gangrene: Secondary | ICD-10-CM | POA: Diagnosis not present

## 2021-02-25 DIAGNOSIS — Z79899 Other long term (current) drug therapy: Secondary | ICD-10-CM | POA: Diagnosis not present

## 2021-02-25 DIAGNOSIS — E119 Type 2 diabetes mellitus without complications: Secondary | ICD-10-CM | POA: Diagnosis not present

## 2021-02-25 DIAGNOSIS — Z955 Presence of coronary angioplasty implant and graft: Secondary | ICD-10-CM | POA: Diagnosis not present

## 2021-02-25 DIAGNOSIS — I252 Old myocardial infarction: Secondary | ICD-10-CM | POA: Diagnosis not present

## 2021-02-25 DIAGNOSIS — M19019 Primary osteoarthritis, unspecified shoulder: Secondary | ICD-10-CM | POA: Diagnosis not present

## 2021-02-25 DIAGNOSIS — Z7982 Long term (current) use of aspirin: Secondary | ICD-10-CM | POA: Diagnosis not present

## 2021-02-25 DIAGNOSIS — F17210 Nicotine dependence, cigarettes, uncomplicated: Secondary | ICD-10-CM | POA: Diagnosis not present

## 2021-02-25 DIAGNOSIS — Z01812 Encounter for preprocedural laboratory examination: Secondary | ICD-10-CM | POA: Insufficient documentation

## 2021-02-25 DIAGNOSIS — I10 Essential (primary) hypertension: Secondary | ICD-10-CM | POA: Diagnosis not present

## 2021-02-25 DIAGNOSIS — Z9889 Other specified postprocedural states: Secondary | ICD-10-CM | POA: Diagnosis not present

## 2021-02-25 DIAGNOSIS — I739 Peripheral vascular disease, unspecified: Secondary | ICD-10-CM | POA: Diagnosis not present

## 2021-02-25 DIAGNOSIS — M170 Bilateral primary osteoarthritis of knee: Secondary | ICD-10-CM | POA: Diagnosis not present

## 2021-02-25 DIAGNOSIS — R0902 Hypoxemia: Secondary | ICD-10-CM | POA: Diagnosis not present

## 2021-02-25 DIAGNOSIS — I70211 Atherosclerosis of native arteries of extremities with intermittent claudication, right leg: Secondary | ICD-10-CM | POA: Diagnosis not present

## 2021-02-25 DIAGNOSIS — Z888 Allergy status to other drugs, medicaments and biological substances status: Secondary | ICD-10-CM | POA: Diagnosis not present

## 2021-02-25 DIAGNOSIS — Z8673 Personal history of transient ischemic attack (TIA), and cerebral infarction without residual deficits: Secondary | ICD-10-CM | POA: Diagnosis not present

## 2021-02-25 DIAGNOSIS — G8918 Other acute postprocedural pain: Secondary | ICD-10-CM | POA: Diagnosis not present

## 2021-02-25 DIAGNOSIS — I70221 Atherosclerosis of native arteries of extremities with rest pain, right leg: Secondary | ICD-10-CM | POA: Diagnosis not present

## 2021-02-25 LAB — SARS CORONAVIRUS 2 (TAT 6-24 HRS): SARS Coronavirus 2: NEGATIVE

## 2021-02-26 ENCOUNTER — Encounter (HOSPITAL_COMMUNITY): Payer: Self-pay | Admitting: Vascular Surgery

## 2021-02-26 NOTE — Progress Notes (Signed)
Spoke with pt for pre-op call. Pt has hx of CAD with stent in 2012. Pt's cardiologist is Dr. Angelena Form. Cardiac clearance note in Epic dated 02/21/21. Pt denies any recent chest pain or shortness of breath. Pt is a type 2 diabetic. Last A1C was 6.2 on 01/24/21. Pt states her fasting blood sugar is usually between 110-120. She states today's blood sugar was 144. Instructed pt not to take her Metformin or her Glipizide morning of surgery. Instructed her to check her blood sugar when she gets up in the AM and every 2 hours until she leaves for the hospital. If blood sugar is 70 or below, treat with 1/2 cup of clear juice (apple or cranberry) and recheck blood sugar 15 minutes after drinking juice. If blood sugar continues to be 70 or below, call the Short Stay department and ask to speak to a nurse. Pt voiced understanding.   Covid test done 02/25/21 and it's negative. Pt states she's been in quarantine since the test was done and understands that she stays in quarantine until she comes to the hospital tomorrow.

## 2021-02-27 ENCOUNTER — Other Ambulatory Visit: Payer: Self-pay

## 2021-02-27 ENCOUNTER — Inpatient Hospital Stay (HOSPITAL_COMMUNITY): Payer: BC Managed Care – PPO | Admitting: Anesthesiology

## 2021-02-27 ENCOUNTER — Encounter (HOSPITAL_COMMUNITY)
Admission: RE | Disposition: A | Discharge status: Home / Self Care | Payer: Self-pay | Source: Home / Self Care | Attending: Vascular Surgery

## 2021-02-27 ENCOUNTER — Encounter (HOSPITAL_COMMUNITY): Payer: Self-pay | Admitting: Vascular Surgery

## 2021-02-27 ENCOUNTER — Inpatient Hospital Stay (HOSPITAL_COMMUNITY)
Admission: RE | Admit: 2021-02-27 | Discharge: 2021-03-06 | DRG: 254 | Disposition: A | Discharge status: Home Health | Payer: BC Managed Care – PPO | Attending: Vascular Surgery | Admitting: Vascular Surgery

## 2021-02-27 DIAGNOSIS — Z7982 Long term (current) use of aspirin: Secondary | ICD-10-CM | POA: Diagnosis not present

## 2021-02-27 DIAGNOSIS — M170 Bilateral primary osteoarthritis of knee: Secondary | ICD-10-CM | POA: Diagnosis present

## 2021-02-27 DIAGNOSIS — R0902 Hypoxemia: Secondary | ICD-10-CM | POA: Diagnosis not present

## 2021-02-27 DIAGNOSIS — E119 Type 2 diabetes mellitus without complications: Secondary | ICD-10-CM | POA: Diagnosis not present

## 2021-02-27 DIAGNOSIS — Z955 Presence of coronary angioplasty implant and graft: Secondary | ICD-10-CM

## 2021-02-27 DIAGNOSIS — M19019 Primary osteoarthritis, unspecified shoulder: Secondary | ICD-10-CM | POA: Diagnosis present

## 2021-02-27 DIAGNOSIS — G8918 Other acute postprocedural pain: Secondary | ICD-10-CM | POA: Diagnosis not present

## 2021-02-27 DIAGNOSIS — Z20822 Contact with and (suspected) exposure to covid-19: Secondary | ICD-10-CM | POA: Diagnosis present

## 2021-02-27 DIAGNOSIS — I10 Essential (primary) hypertension: Secondary | ICD-10-CM | POA: Diagnosis present

## 2021-02-27 DIAGNOSIS — I70221 Atherosclerosis of native arteries of extremities with rest pain, right leg: Secondary | ICD-10-CM | POA: Diagnosis not present

## 2021-02-27 DIAGNOSIS — I2510 Atherosclerotic heart disease of native coronary artery without angina pectoris: Secondary | ICD-10-CM | POA: Diagnosis present

## 2021-02-27 DIAGNOSIS — Z9889 Other specified postprocedural states: Secondary | ICD-10-CM | POA: Diagnosis not present

## 2021-02-27 DIAGNOSIS — E1151 Type 2 diabetes mellitus with diabetic peripheral angiopathy without gangrene: Secondary | ICD-10-CM | POA: Diagnosis present

## 2021-02-27 DIAGNOSIS — I252 Old myocardial infarction: Secondary | ICD-10-CM

## 2021-02-27 DIAGNOSIS — Z7984 Long term (current) use of oral hypoglycemic drugs: Secondary | ICD-10-CM

## 2021-02-27 DIAGNOSIS — I70211 Atherosclerosis of native arteries of extremities with intermittent claudication, right leg: Secondary | ICD-10-CM | POA: Diagnosis present

## 2021-02-27 DIAGNOSIS — Z888 Allergy status to other drugs, medicaments and biological substances status: Secondary | ICD-10-CM | POA: Diagnosis not present

## 2021-02-27 DIAGNOSIS — F17210 Nicotine dependence, cigarettes, uncomplicated: Secondary | ICD-10-CM | POA: Diagnosis present

## 2021-02-27 DIAGNOSIS — Z8673 Personal history of transient ischemic attack (TIA), and cerebral infarction without residual deficits: Secondary | ICD-10-CM | POA: Diagnosis not present

## 2021-02-27 DIAGNOSIS — Z79899 Other long term (current) drug therapy: Secondary | ICD-10-CM | POA: Diagnosis not present

## 2021-02-27 DIAGNOSIS — I739 Peripheral vascular disease, unspecified: Secondary | ICD-10-CM | POA: Diagnosis present

## 2021-02-27 HISTORY — DX: Peripheral vascular disease, unspecified: I73.9

## 2021-02-27 HISTORY — DX: Pneumonia, unspecified organism: J18.9

## 2021-02-27 HISTORY — PX: FEMORAL-POPLITEAL BYPASS GRAFT: SHX937

## 2021-02-27 HISTORY — DX: Cerebral infarction, unspecified: I63.9

## 2021-02-27 HISTORY — DX: Other complications of anesthesia, initial encounter: T88.59XA

## 2021-02-27 LAB — TYPE AND SCREEN
ABO/RH(D): O POS
Antibody Screen: NEGATIVE

## 2021-02-27 LAB — URINALYSIS, ROUTINE W REFLEX MICROSCOPIC
Bilirubin Urine: NEGATIVE
Glucose, UA: NEGATIVE mg/dL
Ketones, ur: NEGATIVE mg/dL
Nitrite: POSITIVE — AB
Protein, ur: NEGATIVE mg/dL
Specific Gravity, Urine: 1.019 (ref 1.005–1.030)
pH: 5 (ref 5.0–8.0)

## 2021-02-27 LAB — COMPREHENSIVE METABOLIC PANEL
ALT: 20 U/L (ref 0–44)
AST: 21 U/L (ref 15–41)
Albumin: 3.8 g/dL (ref 3.5–5.0)
Alkaline Phosphatase: 75 U/L (ref 38–126)
Anion gap: 6 (ref 5–15)
BUN: 9 mg/dL (ref 8–23)
CO2: 29 mmol/L (ref 22–32)
Calcium: 10.1 mg/dL (ref 8.9–10.3)
Chloride: 104 mmol/L (ref 98–111)
Creatinine, Ser: 0.91 mg/dL (ref 0.44–1.00)
GFR, Estimated: >60 mL/min (ref >60)
Glucose, Bld: 150 mg/dL — ABNORMAL HIGH (ref 70–99)
Potassium: 4.1 mmol/L (ref 3.5–5.1)
Sodium: 139 mmol/L (ref 135–145)
Total Bilirubin: 0.9 mg/dL (ref 0.3–1.2)
Total Protein: 7 g/dL (ref 6.5–8.1)

## 2021-02-27 LAB — CBC
HCT: 43.7 % (ref 36.0–46.0)
HCT: 37.7 % (ref 36.0–46.0)
Hemoglobin: 15 g/dL (ref 12.0–15.0)
Hemoglobin: 12.7 g/dL (ref 12.0–15.0)
MCH: 32.8 pg (ref 26.0–34.0)
MCH: 32.2 pg (ref 26.0–34.0)
MCHC: 34.3 g/dL (ref 30.0–36.0)
MCHC: 33.7 g/dL (ref 30.0–36.0)
MCV: 95.4 fL (ref 80.0–100.0)
MCV: 95.7 fL (ref 80.0–100.0)
Platelets: 235 10*3/uL (ref 150–400)
Platelets: 222 10*3/uL (ref 150–400)
RBC: 4.58 MIL/uL (ref 3.87–5.11)
RBC: 3.94 MIL/uL (ref 3.87–5.11)
RDW: 12.7 % (ref 11.5–15.5)
RDW: 12.5 % (ref 11.5–15.5)
WBC: 7 10*3/uL (ref 4.0–10.5)
WBC: 15.6 10*3/uL — ABNORMAL HIGH (ref 4.0–10.5)
nRBC: 0 % (ref 0.0–0.2)
nRBC: 0 % (ref 0.0–0.2)

## 2021-02-27 LAB — PROTIME-INR
INR: 1 (ref 0.8–1.2)
Prothrombin Time: 13.2 seconds (ref 11.4–15.2)

## 2021-02-27 LAB — CREATININE, SERUM
Creatinine, Ser: 0.95 mg/dL (ref 0.44–1.00)
GFR, Estimated: >60 mL/min (ref >60)

## 2021-02-27 LAB — GLUCOSE, CAPILLARY
Glucose-Capillary: 146 mg/dL — ABNORMAL HIGH (ref 70–99)
Glucose-Capillary: 150 mg/dL — ABNORMAL HIGH (ref 70–99)
Glucose-Capillary: 239 mg/dL — ABNORMAL HIGH (ref 70–99)

## 2021-02-27 LAB — APTT: aPTT: 27 seconds (ref 24–36)

## 2021-02-27 LAB — ABO/RH: ABO/RH(D): O POS

## 2021-02-27 SURGERY — BYPASS GRAFT FEMORAL-POPLITEAL ARTERY
Anesthesia: General | Laterality: Right

## 2021-02-27 MED ORDER — ONDANSETRON HCL 4 MG/2ML IJ SOLN
INTRAMUSCULAR | Status: AC
  Filled 2021-02-27: qty 2

## 2021-02-27 MED ORDER — GLIPIZIDE 10 MG PO TABS
10.0000 mg | ORAL_TABLET | Freq: Every day | ORAL | Status: DC
  Administered 2021-02-28 – 2021-03-04 (×5): 10 mg via ORAL
  Filled 2021-02-27 (×5): qty 1

## 2021-02-27 MED ORDER — ACETAMINOPHEN 650 MG RE SUPP
325.0000 mg | RECTAL | Status: DC | PRN
Start: 2021-02-27 — End: 2021-03-06

## 2021-02-27 MED ORDER — ROCURONIUM BROMIDE 10 MG/ML (PF) SYRINGE
PREFILLED_SYRINGE | INTRAVENOUS | Status: DC | PRN
  Administered 2021-02-27: 60 mg via INTRAVENOUS

## 2021-02-27 MED ORDER — CEFAZOLIN SODIUM-DEXTROSE 2-4 GM/100ML-% IV SOLN
2.0000 g | INTRAVENOUS | Status: AC
  Administered 2021-02-27: 2 g via INTRAVENOUS

## 2021-02-27 MED ORDER — PHENYLEPHRINE 40 MCG/ML (10ML) SYRINGE FOR IV PUSH (FOR BLOOD PRESSURE SUPPORT)
PREFILLED_SYRINGE | INTRAVENOUS | Status: DC | PRN
  Administered 2021-02-27 (×2): 80 ug via INTRAVENOUS

## 2021-02-27 MED ORDER — CHLORHEXIDINE GLUCONATE CLOTH 2 % EX PADS: 6.0000 | MEDICATED_PAD | Freq: Once | CUTANEOUS | Status: DC

## 2021-02-27 MED ORDER — ONDANSETRON HCL 4 MG/2ML IJ SOLN
4.0000 mg | Freq: Once | INTRAMUSCULAR | Status: DC | PRN
Start: 2021-02-27 — End: 2021-02-27

## 2021-02-27 MED ORDER — HYDRALAZINE HCL 20 MG/ML IJ SOLN: 5.0000 mg | INTRAMUSCULAR | Status: DC | PRN

## 2021-02-27 MED ORDER — LIDOCAINE 2% (20 MG/ML) 5 ML SYRINGE
INTRAMUSCULAR | Status: DC | PRN
  Administered 2021-02-27: 60 mg via INTRAVENOUS

## 2021-02-27 MED ORDER — PROPOFOL 10 MG/ML IV BOLUS
INTRAVENOUS | Status: DC | PRN
  Administered 2021-02-27: 20 mg via INTRAVENOUS
  Administered 2021-02-27: 100 mg via INTRAVENOUS

## 2021-02-27 MED ORDER — METOPROLOL TARTRATE 5 MG/5ML IV SOLN: 2.0000 mg | INTRAVENOUS | Status: DC | PRN

## 2021-02-27 MED ORDER — MORPHINE SULFATE (PF) 2 MG/ML IV SOLN
2.0000 mg | INTRAVENOUS | Status: DC | PRN
  Administered 2021-02-27 – 2021-03-04 (×6): 2 mg via INTRAVENOUS
  Filled 2021-02-27 (×6): qty 1

## 2021-02-27 MED ORDER — FENTANYL CITRATE (PF) 250 MCG/5ML IJ SOLN
INTRAMUSCULAR | Status: DC | PRN
  Administered 2021-02-27 (×3): 50 ug via INTRAVENOUS
  Administered 2021-02-27: 100 ug via INTRAVENOUS

## 2021-02-27 MED ORDER — LACTATED RINGERS IV SOLN: INTRAVENOUS | Status: DC

## 2021-02-27 MED ORDER — CHLORHEXIDINE GLUCONATE 0.12 % MT SOLN: 15.0000 mL | Freq: Once | OROMUCOSAL | Status: AC

## 2021-02-27 MED ORDER — SODIUM CHLORIDE 0.9 % IV SOLN
INTRAVENOUS | Status: DC | PRN
  Administered 2021-02-27: 500 mL

## 2021-02-27 MED ORDER — PROTAMINE SULFATE 10 MG/ML IV SOLN
INTRAVENOUS | Status: DC | PRN
  Administered 2021-02-27: 50 mg via INTRAVENOUS

## 2021-02-27 MED ORDER — HYDROMORPHONE HCL 1 MG/ML IJ SOLN: 0.2500 mg | INTRAMUSCULAR | Status: DC | PRN

## 2021-02-27 MED ORDER — CARVEDILOL 12.5 MG PO TABS
12.5000 mg | ORAL_TABLET | Freq: Two times a day (BID) | ORAL | Status: DC
  Administered 2021-02-28 – 2021-03-06 (×11): 12.5 mg via ORAL
  Filled 2021-02-27 (×9): qty 1
  Filled 2021-02-27: qty 2
  Filled 2021-02-27 (×2): qty 1

## 2021-02-27 MED ORDER — POLYETHYLENE GLYCOL 3350 17 G PO PACK
17.0000 g | PACK | Freq: Every day | ORAL | Status: DC | PRN
  Administered 2021-03-01: 17 g via ORAL
  Filled 2021-02-27: qty 1

## 2021-02-27 MED ORDER — 0.9 % SODIUM CHLORIDE (POUR BTL) OPTIME
TOPICAL | Status: DC | PRN
  Administered 2021-02-27: 1000 mL

## 2021-02-27 MED ORDER — ATORVASTATIN CALCIUM 10 MG PO TABS
10.0000 mg | ORAL_TABLET | Freq: Every day | ORAL | Status: DC
  Administered 2021-02-28 – 2021-03-01 (×2): 10 mg via ORAL
  Filled 2021-02-27 (×2): qty 1

## 2021-02-27 MED ORDER — SODIUM CHLORIDE 0.9 % IV SOLN: INTRAVENOUS | Status: DC

## 2021-02-27 MED ORDER — BISACODYL 10 MG RE SUPP: 10.0000 mg | Freq: Every day | RECTAL | Status: DC | PRN

## 2021-02-27 MED ORDER — EPHEDRINE SULFATE 50 MG/ML IJ SOLN
INTRAMUSCULAR | Status: DC | PRN
  Administered 2021-02-27: 10 mg via INTRAVENOUS
  Administered 2021-02-27: 5 mg via INTRAVENOUS

## 2021-02-27 MED ORDER — AMLODIPINE BESYLATE 10 MG PO TABS
10.0000 mg | ORAL_TABLET | Freq: Every morning | ORAL | Status: DC
  Administered 2021-02-28 – 2021-03-06 (×7): 10 mg via ORAL
  Filled 2021-02-27 (×7): qty 1

## 2021-02-27 MED ORDER — METFORMIN HCL 500 MG PO TABS
1000.0000 mg | ORAL_TABLET | Freq: Two times a day (BID) | ORAL | Status: DC
  Administered 2021-02-28 – 2021-03-06 (×13): 1000 mg via ORAL
  Filled 2021-02-27 (×13): qty 2

## 2021-02-27 MED ORDER — ORAL CARE MOUTH RINSE: 15.0000 mL | Freq: Once | OROMUCOSAL | Status: AC

## 2021-02-27 MED ORDER — FENTANYL CITRATE (PF) 250 MCG/5ML IJ SOLN
INTRAMUSCULAR | Status: AC
  Filled 2021-02-27: qty 5

## 2021-02-27 MED ORDER — GUAIFENESIN-DM 100-10 MG/5ML PO SYRP: 15.0000 mL | ORAL_SOLUTION | ORAL | Status: DC | PRN

## 2021-02-27 MED ORDER — OXYCODONE-ACETAMINOPHEN 5-325 MG PO TABS
1.0000 | ORAL_TABLET | ORAL | Status: DC | PRN
  Administered 2021-02-28 – 2021-03-02 (×8): 2 via ORAL
  Administered 2021-03-02: 1 via ORAL
  Administered 2021-03-03 – 2021-03-06 (×15): 2 via ORAL
  Filled 2021-02-27 (×2): qty 2
  Filled 2021-02-27: qty 1
  Filled 2021-02-27 (×23): qty 2

## 2021-02-27 MED ORDER — PHENYLEPHRINE HCL-NACL 10-0.9 MG/250ML-% IV SOLN
INTRAVENOUS | Status: DC | PRN
  Administered 2021-02-27: 25 ug/min via INTRAVENOUS

## 2021-02-27 MED ORDER — HYDROMORPHONE HCL 1 MG/ML IJ SOLN
INTRAMUSCULAR | Status: AC
  Administered 2021-02-27: 0.5 mg via INTRAVENOUS
  Filled 2021-02-27: qty 1

## 2021-02-27 MED ORDER — HYDROCHLOROTHIAZIDE 25 MG PO TABS
25.0000 mg | ORAL_TABLET | Freq: Every evening | ORAL | Status: DC
  Administered 2021-02-28 – 2021-03-05 (×5): 25 mg via ORAL
  Filled 2021-02-27 (×7): qty 1

## 2021-02-27 MED ORDER — CEFAZOLIN SODIUM-DEXTROSE 2-4 GM/100ML-% IV SOLN
INTRAVENOUS | Status: AC
  Filled 2021-02-27: qty 100

## 2021-02-27 MED ORDER — DOCUSATE SODIUM 100 MG PO CAPS
100.0000 mg | ORAL_CAPSULE | Freq: Every day | ORAL | Status: DC
  Administered 2021-02-28 – 2021-03-06 (×7): 100 mg via ORAL
  Filled 2021-02-27 (×7): qty 1

## 2021-02-27 MED ORDER — LIDOCAINE 2% (20 MG/ML) 5 ML SYRINGE
INTRAMUSCULAR | Status: AC
  Filled 2021-02-27: qty 5

## 2021-02-27 MED ORDER — INSULIN ASPART 100 UNIT/ML IJ SOLN
0.0000 [IU] | Freq: Three times a day (TID) | INTRAMUSCULAR | Status: DC
  Administered 2021-02-28: 5 [IU] via SUBCUTANEOUS
  Administered 2021-02-28: 2 [IU] via SUBCUTANEOUS
  Administered 2021-02-28: 5 [IU] via SUBCUTANEOUS
  Administered 2021-03-01: 2 [IU] via SUBCUTANEOUS
  Administered 2021-03-01: 3 [IU] via SUBCUTANEOUS
  Administered 2021-03-02: 2 [IU] via SUBCUTANEOUS
  Administered 2021-03-02 (×2): 3 [IU] via SUBCUTANEOUS
  Administered 2021-03-03 (×2): 2 [IU] via SUBCUTANEOUS
  Administered 2021-03-04: 3 [IU] via SUBCUTANEOUS
  Administered 2021-03-04: 2 [IU] via SUBCUTANEOUS

## 2021-02-27 MED ORDER — SODIUM CHLORIDE 0.9 % IV SOLN
INTRAVENOUS | Status: AC
  Filled 2021-02-27: qty 1.2

## 2021-02-27 MED ORDER — DEXAMETHASONE SODIUM PHOSPHATE 10 MG/ML IJ SOLN
INTRAMUSCULAR | Status: AC
  Filled 2021-02-27: qty 1

## 2021-02-27 MED ORDER — HEPARIN SODIUM (PORCINE) 1000 UNIT/ML IJ SOLN
INTRAMUSCULAR | Status: DC | PRN
  Administered 2021-02-27: 6000 [IU] via INTRAVENOUS

## 2021-02-27 MED ORDER — PANTOPRAZOLE SODIUM 40 MG PO TBEC
40.0000 mg | DELAYED_RELEASE_TABLET | Freq: Every day | ORAL | Status: DC
  Administered 2021-02-27 – 2021-03-06 (×8): 40 mg via ORAL
  Filled 2021-02-27 (×8): qty 1

## 2021-02-27 MED ORDER — ACETAMINOPHEN 10 MG/ML IV SOLN: 1000.0000 mg | Freq: Once | INTRAVENOUS | Status: DC | PRN

## 2021-02-27 MED ORDER — MAGNESIUM SULFATE 2 GM/50ML IV SOLN: 2.0000 g | Freq: Every day | INTRAVENOUS | Status: DC | PRN

## 2021-02-27 MED ORDER — DEXAMETHASONE SODIUM PHOSPHATE 10 MG/ML IJ SOLN
INTRAMUSCULAR | Status: DC | PRN
  Administered 2021-02-27: 5 mg via INTRAVENOUS

## 2021-02-27 MED ORDER — ONDANSETRON HCL 4 MG/2ML IJ SOLN
INTRAMUSCULAR | Status: DC | PRN
  Administered 2021-02-27: 4 mg via INTRAVENOUS

## 2021-02-27 MED ORDER — PHENOL 1.4 % MT LIQD
1.0000 | OROMUCOSAL | Status: DC | PRN
Start: 2021-02-27 — End: 2021-03-06

## 2021-02-27 MED ORDER — ACETAMINOPHEN 325 MG PO TABS
325.0000 mg | ORAL_TABLET | ORAL | Status: DC | PRN
Start: 2021-02-27 — End: 2021-03-06

## 2021-02-27 MED ORDER — ROCURONIUM BROMIDE 10 MG/ML (PF) SYRINGE
PREFILLED_SYRINGE | INTRAVENOUS | Status: AC
  Filled 2021-02-27: qty 10

## 2021-02-27 MED ORDER — ONDANSETRON HCL 4 MG/2ML IJ SOLN
4.0000 mg | Freq: Four times a day (QID) | INTRAMUSCULAR | Status: DC | PRN
  Administered 2021-02-27 – 2021-03-06 (×10): 4 mg via INTRAVENOUS
  Filled 2021-02-27 (×11): qty 2

## 2021-02-27 MED ORDER — SODIUM CHLORIDE 0.9 % IV SOLN: 500.0000 mL | Freq: Once | INTRAVENOUS | Status: DC | PRN

## 2021-02-27 MED ORDER — NITROGLYCERIN 0.4 MG SL SUBL: 0.4000 mg | SUBLINGUAL_TABLET | SUBLINGUAL | Status: DC | PRN

## 2021-02-27 MED ORDER — SUGAMMADEX SODIUM 200 MG/2ML IV SOLN
INTRAVENOUS | Status: DC | PRN
  Administered 2021-02-27: 200 mg via INTRAVENOUS

## 2021-02-27 MED ORDER — ASPIRIN EC 81 MG PO TBEC
81.0000 mg | DELAYED_RELEASE_TABLET | Freq: Every morning | ORAL | Status: DC
  Administered 2021-02-28 – 2021-03-06 (×7): 81 mg via ORAL
  Filled 2021-02-27 (×7): qty 1

## 2021-02-27 MED ORDER — HEMOSTATIC AGENTS (NO CHARGE) OPTIME
TOPICAL | Status: DC | PRN
  Administered 2021-02-27: 1 via TOPICAL

## 2021-02-27 MED ORDER — CHLORHEXIDINE GLUCONATE 0.12 % MT SOLN
OROMUCOSAL | Status: AC
  Administered 2021-02-27: 15 mL via OROMUCOSAL
  Filled 2021-02-27: qty 15

## 2021-02-27 MED ORDER — CEFAZOLIN SODIUM-DEXTROSE 2-4 GM/100ML-% IV SOLN
2.0000 g | Freq: Three times a day (TID) | INTRAVENOUS | Status: AC
  Administered 2021-02-27 – 2021-02-28 (×2): 2 g via INTRAVENOUS
  Filled 2021-02-27 (×2): qty 100

## 2021-02-27 MED ORDER — LABETALOL HCL 5 MG/ML IV SOLN: 10.0000 mg | INTRAVENOUS | Status: DC | PRN

## 2021-02-27 MED ORDER — POTASSIUM CHLORIDE CRYS ER 20 MEQ PO TBCR: 20.0000 meq | EXTENDED_RELEASE_TABLET | Freq: Every day | ORAL | Status: DC | PRN

## 2021-02-27 MED ORDER — ALUM & MAG HYDROXIDE-SIMETH 200-200-20 MG/5ML PO SUSP
15.0000 mL | ORAL | Status: DC | PRN
Start: 2021-02-27 — End: 2021-03-06

## 2021-02-27 MED ORDER — HEPARIN SODIUM (PORCINE) 5000 UNIT/ML IJ SOLN
5000.0000 [IU] | Freq: Three times a day (TID) | INTRAMUSCULAR | Status: DC
  Administered 2021-02-28 – 2021-03-06 (×18): 5000 [IU] via SUBCUTANEOUS
  Filled 2021-02-27 (×18): qty 1

## 2021-02-27 MED ORDER — GABAPENTIN 300 MG PO CAPS
300.0000 mg | ORAL_CAPSULE | Freq: Every day | ORAL | Status: DC
  Administered 2021-02-27 – 2021-03-05 (×7): 300 mg via ORAL
  Filled 2021-02-27 (×7): qty 1

## 2021-02-27 MED ORDER — ADULT MULTIVITAMIN W/MINERALS CH
1.0000 | ORAL_TABLET | Freq: Every morning | ORAL | Status: DC
  Administered 2021-02-28 – 2021-03-06 (×7): 1 via ORAL
  Filled 2021-02-27 (×7): qty 1

## 2021-02-27 MED ORDER — CARBOXYMETHYLCELLUL-GLYCERIN 0.5-0.9 % OP SOLN: 1.0000 [drp] | Freq: Three times a day (TID) | OPHTHALMIC | Status: DC | PRN

## 2021-02-27 SURGICAL SUPPLY — 56 items
BANDAGE ESMARK 6X9 LF (GAUZE/BANDAGES/DRESSINGS) IMPLANT
BNDG ESMARK 6X9 LF (GAUZE/BANDAGES/DRESSINGS)
CANISTER SUCT 3000ML PPV (MISCELLANEOUS) ×2 IMPLANT
CANNULA VESSEL 3MM 2 BLNT TIP (CANNULA) ×2 IMPLANT
CLIP LIGATING EXTRA MED SLVR (CLIP) ×4 IMPLANT
CLIP LIGATING EXTRA SM BLUE (MISCELLANEOUS) ×4 IMPLANT
CLIP VESOCCLUDE MED 24/CT (CLIP) ×2 IMPLANT
CLIP VESOCCLUDE SM WIDE 24/CT (CLIP) ×2 IMPLANT
COVER WAND RF STERILE (DRAPES) IMPLANT
CUFF TOURN SGL QUICK 24 (TOURNIQUET CUFF)
CUFF TOURN SGL QUICK 34 (TOURNIQUET CUFF)
CUFF TOURN SGL QUICK 42 (TOURNIQUET CUFF) IMPLANT
CUFF TRNQT CYL 24X4X16.5-23 (TOURNIQUET CUFF) IMPLANT
CUFF TRNQT CYL 34X4.125X (TOURNIQUET CUFF) IMPLANT
DERMABOND ADVANCED (GAUZE/BANDAGES/DRESSINGS) ×2
DERMABOND ADVANCED .7 DNX12 (GAUZE/BANDAGES/DRESSINGS) ×2 IMPLANT
DRAIN CHANNEL 15F RND FF W/TCR (WOUND CARE) IMPLANT
DRAPE C-ARM 42X72 X-RAY (DRAPES) IMPLANT
DRAPE HALF SHEET 40X57 (DRAPES) IMPLANT
DRAPE X-RAY CASS 24X20 (DRAPES) IMPLANT
ELECT REM PT RETURN 9FT ADLT (ELECTROSURGICAL) ×2
ELECTRODE REM PT RTRN 9FT ADLT (ELECTROSURGICAL) ×1 IMPLANT
EVACUATOR SILICONE 100CC (DRAIN) IMPLANT
GLOVE BIO SURGEON STRL SZ7.5 (GLOVE) ×2 IMPLANT
GOWN STRL REUS W/ TWL LRG LVL3 (GOWN DISPOSABLE) ×2 IMPLANT
GOWN STRL REUS W/ TWL XL LVL3 (GOWN DISPOSABLE) ×1 IMPLANT
GOWN STRL REUS W/TWL LRG LVL3 (GOWN DISPOSABLE) ×4
GOWN STRL REUS W/TWL XL LVL3 (GOWN DISPOSABLE) ×2
HEMOSTAT SNOW SURGICEL 2X4 (HEMOSTASIS) IMPLANT
INSERT FOGARTY SM (MISCELLANEOUS) IMPLANT
KIT BASIN OR (CUSTOM PROCEDURE TRAY) ×2 IMPLANT
KIT TURNOVER KIT B (KITS) ×2 IMPLANT
MARKER GRAFT CORONARY BYPASS (MISCELLANEOUS) IMPLANT
NS IRRIG 1000ML POUR BTL (IV SOLUTION) ×2 IMPLANT
PACK PERIPHERAL VASCULAR (CUSTOM PROCEDURE TRAY) ×2 IMPLANT
PAD ARMBOARD 7.5X6 YLW CONV (MISCELLANEOUS) ×4 IMPLANT
POWDER SURGICEL 3.0 GRAM (HEMOSTASIS) ×2 IMPLANT
SET COLLECT BLD 21X3/4 12 (NEEDLE) IMPLANT
SPONGE LAP 18X18 RF (DISPOSABLE) ×2 IMPLANT
STOPCOCK 4 WAY LG BORE MALE ST (IV SETS) IMPLANT
SUT ETHILON 3 0 PS 1 (SUTURE) IMPLANT
SUT MNCRL AB 4-0 PS2 18 (SUTURE) ×6 IMPLANT
SUT PROLENE 5 0 C 1 24 (SUTURE) ×10 IMPLANT
SUT PROLENE 6 0 BV (SUTURE) ×8 IMPLANT
SUT PROLENE 7 0 BV 1 (SUTURE) IMPLANT
SUT SILK 2 0 SH (SUTURE) ×2 IMPLANT
SUT SILK 3 0 (SUTURE) ×2
SUT SILK 3-0 18XBRD TIE 12 (SUTURE) ×1 IMPLANT
SUT VIC AB 2-0 CT1 27 (SUTURE) ×4
SUT VIC AB 2-0 CT1 TAPERPNT 27 (SUTURE) ×2 IMPLANT
SUT VIC AB 3-0 SH 27 (SUTURE) ×8
SUT VIC AB 3-0 SH 27X BRD (SUTURE) ×4 IMPLANT
TOWEL GREEN STERILE (TOWEL DISPOSABLE) ×2 IMPLANT
TRAY FOLEY MTR SLVR 16FR STAT (SET/KITS/TRAYS/PACK) ×2 IMPLANT
UNDERPAD 30X36 HEAVY ABSORB (UNDERPADS AND DIAPERS) ×2 IMPLANT
WATER STERILE IRR 1000ML POUR (IV SOLUTION) ×2 IMPLANT

## 2021-02-27 NOTE — Anesthesia Preprocedure Evaluation (Signed)
Anesthesia Evaluation  Patient identified by MRN, date of birth, ID band Patient awake    Reviewed: Allergy & Precautions, NPO status , Patient's Chart, lab work & pertinent test results  Airway Mallampati: II  TM Distance: >3 FB Neck ROM: Full    Dental no notable dental hx.    Pulmonary sleep apnea , Current Smoker,    Pulmonary exam normal breath sounds clear to auscultation       Cardiovascular hypertension, + CAD, + Past MI, + Cardiac Stents and + Peripheral Vascular Disease  Normal cardiovascular exam Rhythm:Regular Rate:Normal     Neuro/Psych negative neurological ROS  negative psych ROS   GI/Hepatic negative GI ROS, Neg liver ROS,   Endo/Other  diabetes  Renal/GU negative Renal ROS  negative genitourinary   Musculoskeletal negative musculoskeletal ROS (+)   Abdominal   Peds negative pediatric ROS (+)  Hematology negative hematology ROS (+)   Anesthesia Other Findings   Reproductive/Obstetrics negative OB ROS                             Anesthesia Physical Anesthesia Plan  ASA: III  Anesthesia Plan: General   Post-op Pain Management:    Induction: Intravenous  PONV Risk Score and Plan: 2 and Ondansetron, Dexamethasone and Treatment may vary due to age or medical condition  Airway Management Planned: Oral ETT  Additional Equipment:   Intra-op Plan:   Post-operative Plan: Extubation in OR  Informed Consent: I have reviewed the patients History and Physical, chart, labs and discussed the procedure including the risks, benefits and alternatives for the proposed anesthesia with the patient or authorized representative who has indicated his/her understanding and acceptance.     Dental advisory given  Plan Discussed with: CRNA and Surgeon  Anesthesia Plan Comments:         Anesthesia Quick Evaluation

## 2021-02-27 NOTE — Transfer of Care (Signed)
Immediate Anesthesia Transfer of Care Note  Patient: KATHRENE SINOPOLI  Procedure(s) Performed: RIGHT COMMON FEMORAL- ABOVE KNEE POPLITEAL ARTERY BYPASS GRAFT (Right )  Patient Location: PACU  Anesthesia Type:General  Level of Consciousness: awake  Airway & Oxygen Therapy: Patient Spontanous Breathing and Patient connected to face mask oxygen  Post-op Assessment: Report given to RN, Post -op Vital signs reviewed and stable and Patient moving all extremities X 4  Post vital signs: Reviewed and stable  Last Vitals:  Vitals Value Taken Time  BP 120/71 02/27/21 1731  Temp    Pulse 72 02/27/21 1731  Resp 14 02/27/21 1731  SpO2 100 % 02/27/21 1731  Vitals shown include unvalidated device data.  Last Pain:  Vitals:   02/27/21 1215  TempSrc:   PainSc: 0-No pain      Patients Stated Pain Goal: 4 (28/63/81 7711)  Complications: No complications documented.
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
## 2021-02-27 NOTE — Discharge Instructions (Signed)
 [facility] [place]  Discharge instructions  Lower Extremity Bypass Surgery  Please refer to the following instruction for your post-procedure care. Your surgeon or physician assistant will discuss any changes with you.  Activity  You are encouraged to walk as much as you can. You can slowly return to normal activities during the month after your surgery. Avoid strenuous activity and heavy lifting until your doctor tells you it's OK. Avoid activities such as vacuuming or swinging a golf club. Do not drive until your doctor give the OK and you are no longer taking prescription pain medications. It is also normal to have difficulty with sleep habits, eating and bowel movement after surgery. These will go away with time.  Bathing/Showering  Shower daily after you go home. Do not soak in a bathtub, hot tub, or swim until the incision heals completely.  Incision Care  Clean your incision with mild soap and water. Shower every day. Pat the area dry with a clean towel. You do not need a bandage unless otherwise instructed. Do not apply any ointments or creams to your incision. If you have open wounds you will be instructed how to care for them or a visiting nurse may be arranged for you. If you have staples or sutures along your incision they will be removed at your post-op appointment. You may have skin glue on your incision. Do not peel it off. It will come off on its own in about one week.  Wash the groin wound with soap and water daily and pat dry. (No tub bath-only shower)  Then put a dry gauze or washcloth in the groin to keep this area dry to help prevent wound infection.  Do this daily and as needed.  Do not use Vaseline or neosporin on your incisions.  Only use soap and water on your incisions and then protect and keep dry.  Diet  Resume your normal diet. There are no special food restrictions following this procedure. A low fat/ low cholesterol diet is  recommended for all patients with vascular disease. In order to heal from your surgery, it is CRITICAL to get adequate nutrition. Your body requires vitamins, minerals, and protein. Vegetables are the best source of vitamins and minerals. Vegetables also provide the perfect balance of protein. Processed food has little nutritional value, so try to avoid this.  Medications  Resume taking all your medications unless your doctor or physician assistant tells you not to. If your incision is causing pain, you may take over-the-counter pain relievers such as acetaminophen (Tylenol). If you were prescribed a stronger pain medication, please aware these medication can cause nausea and constipation. Prevent nausea by taking the medication with a snack or meal. Avoid constipation by drinking plenty of fluids and eating foods with high amount of fiber, such as fruits, vegetables, and grains. Take Colace 100 mg (an over-the-counter stool softener) twice a day as needed for constipation.  Do not take Tylenol if you are taking prescription pain medications.  Follow Up  Our office will schedule a follow up appointment 2-3 weeks following discharge.  Please call us immediately for any of the following conditions  Severe or worsening pain in your legs or feet while at rest or while walking Increase pain, redness, warmth, or drainage (pus) from your incision site(s) Fever of 101 degree or higher The swelling in your leg with the bypass suddenly worsens and becomes more painful than when you were in the hospital If you have   been instructed to feel your graft pulse then you should do so every day. If you can no longer feel this pulse, call the office immediately. Not all patients are given this instruction.  Leg swelling is common after leg bypass surgery.  The swelling should improve over a few months following surgery. To improve the swelling, you may elevate your legs above the level of your heart while you are  sitting or resting. Your surgeon or physician assistant may ask you to apply an ACE wrap or wear compression (TED) stockings to help to reduce swelling.  Reduce your risk of vascular disease  Stop smoking. If you would like help call [facility] at 1-800-QUIT-NOW (1-800-784-8669) or [place] at 336-586-4000.  Manage your cholesterol Maintain a desired weight Control your diabetes weight Control your diabetes Keep your blood pressure down  If you have any questions, please call the office at [phone]  

## 2021-02-27 NOTE — Anesthesia Procedure Notes (Signed)
Procedure Name: Intubation Date/Time: 02/27/2021 2:34 PM Performed by: Dorthea Cove, CRNA Pre-anesthesia Checklist: Patient identified, Emergency Drugs available, Suction available and Patient being monitored Patient Re-evaluated:Patient Re-evaluated prior to induction Oxygen Delivery Method: Circle system utilized Preoxygenation: Pre-oxygenation with 100% oxygen Induction Type: IV induction Ventilation: Mask ventilation without difficulty Laryngoscope Size: Mac and 3 Grade View: Grade I Tube type: Oral Tube size: 7.0 mm Number of attempts: 1 Airway Equipment and Method: Stylet and Oral airway Placement Confirmation: ETT inserted through vocal cords under direct vision,  positive ETCO2 and breath sounds checked- equal and bilateral Secured at: 21 cm Tube secured with: Tape Dental Injury: Teeth and Oropharynx as per pre-operative assessment

## 2021-02-27 NOTE — Op Note (Signed)
Patient name: Tammy Graham MRN: [id number] DOB: 30-Mar-1951 Sex: female  02/27/2021 Pre-operative Diagnosis: right lower extremity life limiting claudication Post-operative diagnosis:  Same Surgeon:  Erlene Quan C. Donzetta Matters, MD Assistant: Leontine Locket, PA Procedure Performed: 1.  Harvest right greater saphenous vein 2.  Right SFA to above-knee popliteal artery bypass with ipsilateral, translocated, reversed greater saphenous vein  Indications: 70 year old female with history of right lower extremity claudication which is life limiting.  She underwent attempted endovascular revascularization by an outside interventional radiologist and this was failed.  Her symptoms subsequently worsened.  She is now indicated for bypass.  An assistant was necessary to expedite the case.  Findings: The profunda was much larger than the SFA.  The bifurcation was high up onto the inguinal ligament.  The SFA was patent proximally.  The vein did dilate nicely up to approximately 3-1/2 mm and was suitable.  We reversed the vein.  The vein was sewn end to end to the SFA and end-to-side to the popliteal artery.  At completion there was a very strong signal at the posterior tibial at the ankle that was graft dependent.   Procedure:  The patient was identified in the holding area and taken to the operating room where she is placed supine on upper table and general anesthesia was induced.  She was sterilely prepped and draped in the right lower extremity in the usual fashion, antibiotics were ministered and a timeout was called.  We used ultrasound we identified what appeared to be a suitable vein throughout the upper leg.  This did get much smaller at the knee.  We also identified the common femoral artery and bifurcation.  The bifurcation was very high under the inguinal ligament.  The transverse incision was made in the groin.  We dissected down to the common femoral artery up onto the inguinal ligament.  We also dissected  out the profunda proximally in the SFA proximally which was patent initially.  We then made an incision above the knee dissected down to the popliteal artery where it was soft in the usual fashion.  We we identified the vein through the same incision.  2 separate skip incisions were made.  We dissected out the vein throughout its course dividing branches between clips and ties.  At the distal end we triply clipped it and divided it.  At the saphenofemoral junction we placed a side-biting clamp and divided it.  We oversewed the saphenofemoral junction with running 5-0 Prolene suture in a mattress fashion.  The vein was then prepared on the back table.  A tunnel was placed between the above-knee popliteal and groin incision.  Patient was fully heparinized.  We then clamped the SFA proximally and distally within the wound bed and transected oversewed with 5-0 Prolene suture distally.  We spatulated.  We had very strong inflow into the SFA to despite it being much smaller than the profunda.  The saphenous vein was reversed and spatulated.  We sewed them end-to-end with 6-0 Prolene suture.  Upon completion we released our clamps we had very good flow.  We had a few areas to repair.  We then marked for orientation and tunneled.  We had very good pulsatility distally after tunneling.  The vein graft was clamped.  The popliteal artery where it was soft with clot proximally distally opened longitudinally.  We had good backbleeding there.  We irrigated with heparinized saline.  We straighten the leg trimmed the vein graft to size.  It was  spatulated and sewn end-to-side with 6-0 Prolene suture.  This time prior to completion of flushing all directions.  Upon completion there was very good pulsatility in her distal popliteal artery there was good signal in the popliteal artery as well as the posterior tibial artery that was graft dependent.  We obtain pneumostasis and irrigated the wounds.  We administered 50 mg of protamine.   We closed our incisions with Vicryl Monocryl.  Dermabond placed to the level of skin.  She was awakened from anesthesia having tolerated procedure without any complication.  All counts were correct at completion.   EBL: 250cc  Chenee Munns C. Donzetta Matters, MD [facility] [place] Office: [phone] Pager: [phone]

## 2021-02-27 NOTE — Progress Notes (Signed)
Pt arrived to 4e from PACU. Pt oriented to room and staff. Telemetry box applied and CCMD notified. Right pedal and PT pulses present with doppler. Soft swelling marked on right groin. Pt complaining of pain and nausea. PRN meds given as ordered.

## 2021-02-28 ENCOUNTER — Encounter (HOSPITAL_COMMUNITY): Payer: Self-pay | Admitting: Vascular Surgery

## 2021-02-28 LAB — LIPID PANEL
Cholesterol: 109 mg/dL (ref 0–200)
HDL: 49 mg/dL (ref >40)
LDL Cholesterol: 48 mg/dL (ref 0–99)
Total CHOL/HDL Ratio: 2.2 RATIO
Triglycerides: 59 mg/dL (ref <150)
VLDL: 12 mg/dL (ref 0–40)

## 2021-02-28 LAB — GLUCOSE, CAPILLARY
Glucose-Capillary: 212 mg/dL — ABNORMAL HIGH (ref 70–99)
Glucose-Capillary: 205 mg/dL — ABNORMAL HIGH (ref 70–99)
Glucose-Capillary: 130 mg/dL — ABNORMAL HIGH (ref 70–99)
Glucose-Capillary: 206 mg/dL — ABNORMAL HIGH (ref 70–99)

## 2021-02-28 LAB — BASIC METABOLIC PANEL
Anion gap: 8 (ref 5–15)
BUN: 10 mg/dL (ref 8–23)
CO2: 26 mmol/L (ref 22–32)
Calcium: 9.1 mg/dL (ref 8.9–10.3)
Chloride: 102 mmol/L (ref 98–111)
Creatinine, Ser: 1.04 mg/dL — ABNORMAL HIGH (ref 0.44–1.00)
GFR, Estimated: 58 mL/min — ABNORMAL LOW (ref >60)
Glucose, Bld: 264 mg/dL — ABNORMAL HIGH (ref 70–99)
Potassium: 4.3 mmol/L (ref 3.5–5.1)
Sodium: 136 mmol/L (ref 135–145)

## 2021-02-28 LAB — CBC
HCT: 34.4 % — ABNORMAL LOW (ref 36.0–46.0)
Hemoglobin: 11.8 g/dL — ABNORMAL LOW (ref 12.0–15.0)
MCH: 32.7 pg (ref 26.0–34.0)
MCHC: 34.3 g/dL (ref 30.0–36.0)
MCV: 95.3 fL (ref 80.0–100.0)
Platelets: 193 10*3/uL (ref 150–400)
RBC: 3.61 MIL/uL — ABNORMAL LOW (ref 3.87–5.11)
RDW: 12.7 % (ref 11.5–15.5)
WBC: 13.5 10*3/uL — ABNORMAL HIGH (ref 4.0–10.5)
nRBC: 0 % (ref 0.0–0.2)

## 2021-02-28 NOTE — Progress Notes (Signed)
Mobility Specialist - Progress Note   02/28/21 1650  Mobility  Activity Ambulated in hall  Level of Assistance Minimal assist, patient does 75% or more  Assistive Device Front wheel walker  Distance Ambulated (ft) 500 ft  Mobility Ambulated with assistance in hallway  Mobility Response Tolerated well  Mobility performed by Mobility specialist  $Mobility charge 1 Mobility   Pt c/o soreness in RLE, otherwise asx. Pt sitting up on edge of bed after walk, call bell at side. VSS.   Pricilla Handler Mobility Specialist Mobility Specialist Phone: [phone]

## 2021-02-28 NOTE — Progress Notes (Addendum)
  Progress Note    02/28/2021 7:20 AM 1 Day Post-Op  Subjective:  No complaints; says she feels good  Afebrile HR 60's-80's NSR 812'X-517'G systolic 01-74% RA  Vitals:   02/27/21 2332 02/28/21 0300  BP: 109/61 103/67  Pulse: 75 76  Resp: 15 16  Temp: 97.6 F (36.4 C) 97.8 F (36.6 C)  SpO2: 97% 95%    Physical Exam: Cardiac:  regular Lungs:  Non labored Incisions:  All incisions look good Extremities:  Brisk right DP/PT doppler signals   CBC    Component Value Date/Time   WBC 13.5 (H) 02/28/2021 0155   RBC 3.61 (L) 02/28/2021 0155   HGB 11.8 (L) 02/28/2021 0155   HGB 15.3 07/01/2019 1550   HCT 34.4 (L) 02/28/2021 0155   HCT 46.5 07/01/2019 1550   PLT 193 02/28/2021 0155   PLT 257 07/01/2019 1550   MCV 95.3 02/28/2021 0155   MCV 98 (H) 07/01/2019 1550   MCH 32.7 02/28/2021 0155   MCHC 34.3 02/28/2021 0155   RDW 12.7 02/28/2021 0155   RDW 12.6 07/01/2019 1550   LYMPHSABS 2.9 09/11/2016 2316   MONOABS 0.4 09/11/2016 2316   EOSABS 0.2 09/11/2016 2316   BASOSABS 0.0 09/11/2016 2316    BMET    Component Value Date/Time   NA 136 02/28/2021 0155   NA 141 02/21/2020 1122   K 4.3 02/28/2021 0155   CL 102 02/28/2021 0155   CO2 26 02/28/2021 0155   GLUCOSE 264 (H) 02/28/2021 0155   BUN 10 02/28/2021 0155   BUN 14 02/21/2020 1122   CREATININE 1.04 (H) 02/28/2021 0155   CREATININE 1.20 (H) 11/01/2015 1459   CALCIUM 9.1 02/28/2021 0155   GFRNONAA 58 (L) 02/28/2021 0155   GFRNONAA 57 (L) 01/11/2014 1022   GFRAA 55 (L) 02/21/2020 1122   GFRAA 66 01/11/2014 1022    INR    Component Value Date/Time   INR 1.0 02/27/2021 1126     Intake/Output Summary (Last 24 hours) at 02/28/2021 0720 Last data filed at 02/28/2021 0650 Gross per 24 hour  Intake 1100 ml  Output 1150 ml  Net -50 ml     Assessment:  70 y.o. female is s/p:  1.  Harvest right greater saphenous vein 2.  Right SFA to above-knee popliteal artery bypass with ipsilateral, translocated,  reversed greater saphenous vein  1 Day Post-Op   Plan: -pt with brisk right DP/PT doppler signals -incisions look fine -will mobilize today -DVT prophylaxis:  Sq heparin to start this afternoon   Leontine Locket, PA-C [facility] [phone] 02/28/2021 7:20 AM   I have independently interviewed and examined patient and agree with PA assessment and plan above. Plan for oob today. She is on an aspirin and statin.  Terriana Barreras C. Donzetta Matters, MD [facility] [place] Office: [phone] Pager: [phone]

## 2021-02-28 NOTE — Anesthesia Postprocedure Evaluation (Signed)
Anesthesia Post Note  Patient: Tammy Graham  Procedure(s) Performed: RIGHT COMMON FEMORAL- ABOVE KNEE POPLITEAL ARTERY BYPASS GRAFT (Right )     Patient location during evaluation: PACU Anesthesia Type: General Level of consciousness: awake and alert Pain management: pain level controlled Vital Signs Assessment: post-procedure vital signs reviewed and stable Respiratory status: spontaneous breathing, nonlabored ventilation, respiratory function stable and patient connected to nasal cannula oxygen Cardiovascular status: blood pressure returned to baseline and stable Postop Assessment: no apparent nausea or vomiting Anesthetic complications: no   No complications documented.  Last Vitals:  Vitals:   02/28/21 0842 02/28/21 1145  BP: (!) 109/52 101/64  Pulse: 85 73  Resp: 19 18  Temp:  36.6 C  SpO2: 97% 97%    Last Pain:  Vitals:   02/28/21 1251  TempSrc:   PainSc: 4                  Lian Pounds S

## 2021-02-28 NOTE — Evaluation (Signed)
Occupational Therapy Evaluation Patient Details Name: Tammy Graham MRN: [id number] DOB: [birth year] Today's Date: 02/28/2021    History of Present Illness 70 year old female with history of right lower extremity claudication which is life limiting.  She underwent attempted endovascular revascularization by an outside interventional radiologist and this was failed.  Her symptoms subsequently worsened. S/p harvest right greater saphenous vein,  Right SFA to above-knee popliteal artery bypass with ipsilateral, translocated, reversed greater saphenous vein   Clinical Impression   Pt presents with decline in function and safety with ADLs and ADL mobility with impaired balance, endurance and safety awareness. Pt impulsive, hyperverbose requiring redirection multiple times. PTA pt lived at home alone in a senior apartments and states that she was Ind with ADLs, IADLs, mobility with no AD, was driving and working at [employer]. Pt currently requires min guard A to sit EOB, mod A with LB ADLs, min guard standing at sink for grooming, min A with transfers using RW. Pt reports that her neighbors can help her out as needed, but about her nearby family she states, "I don't fool with them, they are drug addicts". Pt would benefit from acute OT services to address impairments to maximize level of function and safety    Follow Up Recommendations  Home health OT;Supervision - Intermittent    Equipment Recommendations  3 in 1 bedside commode;Other (comment) (RW)    Recommendations for Other Services       Precautions / Restrictions Precautions Precautions: Fall Restrictions Weight Bearing Restrictions: No      Mobility Bed Mobility Overal bed mobility: Needs Assistance Bed Mobility: Supine to Sit;Sit to Supine     Supine to sit: Min guard Sit to supine: Min guard        Transfers Overall transfer level: Needs assistance Equipment used: Rolling walker (2 wheeled) Transfers: Sit to/from  [facility] Sit to Stand: Min assist Stand pivot transfers: Min guard       General transfer comment: mod verbal cues for safety, pt impulsive with quick movements    Balance Overall balance assessment: Needs assistance Sitting-balance support: No upper extremity supported;Feet supported Sitting balance-Leahy Scale: Good     Standing balance support: Bilateral upper extremity supported;During functional activity Standing balance-Leahy Scale: Poor                             ADL either performed or assessed with clinical judgement   ADL Overall ADL's : Needs assistance/impaired Eating/Feeding: Independent;Sitting   Grooming: Wash/dry hands;Wash/dry face;Min guard;Standing   Upper Body Bathing: Set up;Sitting   Lower Body Bathing: Moderate assistance;Sitting/lateral leans   Upper Body Dressing : Set up;Sitting   Lower Body Dressing: Moderate assistance;Sitting/lateral leans   Toilet Transfer: Minimal assistance;Ambulation;RW;BSC;Cueing for safety   Toileting- Clothing Manipulation and Hygiene: Minimal assistance;Sit to/from stand;Cueing for safety       Functional mobility during ADLs: Minimal assistance;Rolling walker;Cueing for safety       Vision Baseline Vision/History: Wears glasses Wears Glasses: At all times Patient Visual Report: No change from baseline       Perception     Praxis      Pertinent Vitals/Pain Pain Assessment: 0-10 Pain Score: 6  Pain Location: R LE surgical sites Pain Descriptors / Indicators: Sore Pain Intervention(s): Monitored during session;Repositioned     Hand Dominance Right   Extremity/Trunk Assessment Upper Extremity Assessment Upper Extremity Assessment: Overall WFL for tasks assessed   Lower Extremity Assessment Lower Extremity Assessment:  Defer to PT evaluation       Communication Communication Communication: No difficulties   Cognition Arousal/Alertness: Awake/alert Behavior  During Therapy: WFL for tasks assessed/performed Overall Cognitive Status: No family/caregiver present to determine baseline cognitive functioning Area of Impairment: Memory;Safety/judgement;Awareness                     Memory: Decreased short-term memory   Safety/Judgement: Decreased awareness of safety     General Comments: pt states that she is forgetful. Pt with Poor safety awareness, impulsive, hyperverbose requring redirection multple times   General Comments       Exercises     Shoulder Instructions      Home Living Family/patient expects to be discharged to:: Private residence Living Arrangements: Alone Available Help at Discharge: Neighbor;Available PRN/intermittently Type of Home: Apartment Home Access: Stairs to enter Entrance Stairs-Number of Steps: 6   Home Layout: One level     Bathroom Shower/Tub: Teacher, early years/pre: Standard     Home Equipment: None          Prior Functioning/Environment Level of Independence: Independent        Comments: Pt drives, works at [employer] List: Impaired balance (sitting and/or standing);Decreased cognition;Pain;Decreased safety awareness;Decreased activity tolerance;Decreased knowledge of use of DME or AE      OT Treatment/Interventions: Self-care/ADL training;Patient/family education;Therapeutic activities;DME and/or AE instruction    OT Goals(Current goals can be found in the care plan section) Acute Rehab OT Goals Patient Stated Goal: "go home, but to rehab if I need to" OT Goal Formulation: With patient Time For Goal Achievement: 03/14/21 Potential to Achieve Goals: Good ADL Goals Pt Will Perform Grooming: with supervision;with set-up;with modified independence;standing Pt Will Perform Lower Body Bathing: with min assist;with min guard assist;sitting/lateral leans;sit to/from stand;with adaptive equipment Pt Will Perform Lower Body Dressing: with min assist;with  min guard assist;with supervision;with adaptive equipment;sitting/lateral leans;sit to/from stand Pt Will Transfer to Toilet: with min guard assist;with supervision;ambulating Pt Will Perform Toileting - Clothing Manipulation and hygiene: with min guard assist;with supervision;sit to/from stand Pt Will Perform Tub/Shower Transfer: with min guard assist;with supervision;ambulating;3 in 1;shower seat  OT Frequency: Min 2X/week   Barriers to D/C:            Co-evaluation              AM-PAC OT "6 Clicks" Daily Activity     Outcome Measure Help from another person eating meals?: None Help from another person taking care of personal grooming?: A Little Help from another person toileting, which includes using toliet, bedpan, or urinal?: A Little Help from another person bathing (including washing, rinsing, drying)?: A Lot Help from another person to put on and taking off regular upper body clothing?: None Help from another person to put on and taking off regular lower body clothing?: A Lot 6 Click Score: 18   End of Session Equipment Utilized During Treatment: Gait belt;Rolling walker;Other (comment) [facility]) Nurse Communication: Mobility status  Activity Tolerance: Patient tolerated treatment well Patient left: in bed;with call bell/phone within reach;with bed alarm set  OT Visit Diagnosis: Unsteadiness on feet (R26.81);Other abnormalities of gait and mobility (R26.89);Pain;Other symptoms and signs involving cognitive function Pain - Right/Left: Right Pain - part of body: Leg                Time: 8315-1761 OT Time Calculation (min): 29 min Charges:  OT General Charges $OT Visit: 1  Visit OT Evaluation $OT Eval Moderate Complexity: 1 Mod OT Treatments $Self Care/Home Management : 8-22 mins    Britt Bottom 02/28/2021, 2:19 PM

## 2021-02-28 NOTE — Evaluation (Signed)
Physical Therapy Evaluation Patient Details Name: Tammy Graham MRN: [id number] DOB: 1951/06/01 Today's Date: 02/28/2021   History of Present Illness  70 year old female with history of right lower extremity claudication which is life limiting.  She underwent attempted endovascular revascularization by an outside interventional radiologist and this was failed.  Her symptoms subsequently worsened. S/p harvest right greater saphenous vein,  Right SFA to above-knee popliteal artery bypass with ipsilateral, translocated, reversed greater saphenous vein  Clinical Impression  Pt admitted with/for R LE life limiting claudication, s/p revascularization.  Pt needing min guard to minimal assist for basic mobility..  Pt currently limited functionally due to the problems listed. ( See problems list.)   Pt will benefit from PT to maximize function and safety in order to get ready for next venue listed below.     Follow Up Recommendations Supervision - Intermittent;Home health PT    Equipment Recommendations  Rolling walker with 5" wheels    Recommendations for Other Services       Precautions / Restrictions Precautions Precautions: Fall Restrictions Weight Bearing Restrictions: No      Mobility  Bed Mobility Overal bed mobility: Needs Assistance Bed Mobility: Supine to Sit;Sit to Supine     Supine to sit: Min guard Sit to supine: Min assist   General bed mobility comments: LE assist to get back into bed.    Transfers Overall transfer level: Needs assistance Equipment used: Rolling walker (2 wheeled) Transfers: Sit to/from Stand Sit to Stand: Min assist Stand pivot transfers: Min guard       General transfer comment: cues for hand placement and safety, mild impulsivities  Ambulation/Gait Ambulation/Gait assistance: Min assist Gait Distance (Feet): 140 Feet Assistive device: Rolling walker (2 wheeled) Gait Pattern/deviations: Step-through pattern   Gait velocity  interpretation: 1.31 - 2.62 ft/sec, indicative of limited community ambulator General Gait Details: mildly unsteady at times, cues for better proximity to the RW and posture,  Assist for control of the RW more that pt's stability.  Stairs            Wheelchair Mobility    Modified Rankin (Stroke Patients Only)       Balance Overall balance assessment: Needs assistance Sitting-balance support: No upper extremity supported;Feet supported Sitting balance-Leahy Scale: Good     Standing balance support: No upper extremity supported;Bilateral upper extremity supported;During functional activity Standing balance-Leahy Scale: Poor Standing balance comment: reliant on AD.                             Pertinent Vitals/Pain Pain Assessment: 0-10 Pain Score: 6  Pain Location: R LE surgical sites Pain Descriptors / Indicators: Sore Pain Intervention(s): Monitored during session    Home Living Family/patient expects to be discharged to:: Private residence Living Arrangements: Alone Available Help at Discharge: Neighbor;Available PRN/intermittently Type of Home: Apartment Home Access: Stairs to enter   Entrance Stairs-Number of Steps: 6 Home Layout: One level Home Equipment: None      Prior Function Level of Independence: Independent         Comments: Pt drives, works at [employer]   Dominant Hand: Right    Extremity/Trunk Assessment   Upper Extremity Assessment Upper Extremity Assessment: Overall WFL for tasks assessed    Lower Extremity Assessment Lower Extremity Assessment: RLE deficits/detail;LLE deficits/detail RLE Deficits / Details: generally weak, stiff, needing assist against gravity, able to bear weight. RLE: Unable to fully assess due to  pain RLE Coordination: decreased fine motor LLE Deficits / Details: mild weakness, stiff, but functional       Communication   Communication: No difficulties  Cognition  Arousal/Alertness: Awake/alert Behavior During Therapy: WFL for tasks assessed/performed Overall Cognitive Status: No family/caregiver present to determine baseline cognitive functioning Area of Impairment: Memory;Safety/judgement;Awareness                     Memory: Decreased short-term memory   Safety/Judgement: Decreased awareness of safety     General Comments: pt states that she is forgetful. Pt with Poor safety awareness, impulsive, hyperverbose requring redirection multple times      General Comments General comments (skin integrity, edema, etc.): vss    Exercises     Assessment/Plan    PT Assessment Patient needs continued PT services  PT Problem List Decreased strength;Decreased activity tolerance;Decreased mobility;Decreased balance;Decreased knowledge of use of DME;Pain       PT Treatment Interventions DME instruction;Gait training;Stair training;Functional mobility training;Therapeutic activities;Patient/family education    PT Goals (Current goals can be found in the Care Plan section)  Acute Rehab PT Goals Patient Stated Goal: "go home, but to rehab if I need to" PT Goal Formulation: With patient Time For Goal Achievement: 03/14/21 Potential to Achieve Goals: Good    Frequency Min 3X/week   Barriers to discharge        Co-evaluation               AM-PAC PT "6 Clicks" Mobility  Outcome Measure Help needed turning from your back to your side while in a flat bed without using bedrails?: A Little Help needed moving from lying on your back to sitting on the side of a flat bed without using bedrails?: A Little Help needed moving to and from a bed to a chair (including a wheelchair)?: A Little Help needed standing up from a chair using your arms (e.g., wheelchair or bedside chair)?: A Little Help needed to walk in hospital room?: A Little Help needed climbing 3-5 steps with a railing? : A Little 6 Click Score: 18    End of Session    Activity Tolerance: Patient tolerated treatment well Patient left: in bed;with call bell/phone within reach Nurse Communication: Mobility status PT Visit Diagnosis: Other abnormalities of gait and mobility (R26.89);Pain Pain - Right/Left: Right Pain - part of body: Leg    Time: 1326-1401 PT Time Calculation (min) (ACUTE ONLY): 35 min   Charges:   PT Evaluation $PT Eval Moderate Complexity: 1 Mod PT Treatments $Gait Training: 8-22 mins        02/28/2021  Ginger Carne., PT Acute Rehabilitation Services [phone]  (pager) [phone]  (office)  Tammy Graham 02/28/2021, 3:03 PM

## 2021-03-01 LAB — GLUCOSE, CAPILLARY
Glucose-Capillary: 145 mg/dL — ABNORMAL HIGH (ref 70–99)
Glucose-Capillary: 150 mg/dL — ABNORMAL HIGH (ref 70–99)
Glucose-Capillary: 198 mg/dL — ABNORMAL HIGH (ref 70–99)
Glucose-Capillary: 184 mg/dL — ABNORMAL HIGH (ref 70–99)
Glucose-Capillary: 140 mg/dL — ABNORMAL HIGH (ref 70–99)

## 2021-03-01 MED ORDER — ATORVASTATIN CALCIUM 40 MG PO TABS
40.0000 mg | ORAL_TABLET | Freq: Every day | ORAL | Status: DC
  Administered 2021-03-02 – 2021-03-06 (×5): 40 mg via ORAL
  Filled 2021-03-01 (×5): qty 1

## 2021-03-01 NOTE — Progress Notes (Signed)
Mobility Specialist: Progress Note   03/01/21 1729  Mobility  Activity Ambulated in hall  Level of Assistance Contact guard assist, steadying assist  Assistive Device Four wheel walker  Distance Ambulated (ft) 320 ft  Mobility Ambulated with assistance in hallway  Mobility Response Tolerated well  Mobility performed by Mobility specialist  $Mobility charge 1 Mobility   Pre-Mobility: 78 HR, 94% SpO2  Pt to BR and then agreeable to ambulate. Pt c/o soreness in RLE, otherwise asx. Pt sitting EOB with call bell at her side.   [facility] Bharath Bernstein Mobility Specialist Mobility Specialist Phone: [phone]

## 2021-03-01 NOTE — Progress Notes (Signed)
Occupational Therapy Treatment Patient Details Name: Tammy Graham MRN: [id number] DOB: [birth year] Today's Date: 03/01/2021    History of present illness 70 year old female with history of right lower extremity claudication which is life limiting.  She underwent attempted endovascular revascularization by an outside interventional radiologist and this was failed.  Her symptoms subsequently worsened. S/p harvest right greater saphenous vein,  Right SFA to above-knee popliteal artery bypass with ipsilateral, translocated, reversed greater saphenous vein   OT comments  Pt making limited progress towards OT goals this session. Pt required multiple redirection to task throughout session. Today she was min guard for bed mobility. Min A for transfers. Today due to concern for orthostatics and pain medication limited to transfer to recliner. Encouraged walk to bathroom prior to return to bed with RN staff to sit on commode as Pt was negative for orthostatics. Pt is mod A for LB ADL at this time (can help from knees up) set up for grooming tasks in the recliner, educated on having a chair in bathroom at home at Pt when standing states "I have got to sit down" after about 30 seconds. She does better when she is moving (pivoting today). Pt continues with concerns of cognition with deficits in safety awareness, and if she has not improved by next session may need SNF post-acute. Continue to monitor.   03/01/21 0913   Orthostatic Lying   BP- Lying 108/62  Orthostatic Sitting  BP- Sitting 117/72  Orthostatic Standing at 0 minutes  BP- Standing at 0 minutes 110/68  Orthostatic Standing at 3 minutes  BP- Standing at 3 minutes 115/60     Follow Up Recommendations  Home health OT;Supervision - Intermittent    Equipment Recommendations  3 in 1 bedside commode;Other (comment) (RW)    Recommendations for Other Services      Precautions / Restrictions Precautions Precautions: Fall Restrictions Weight  Bearing Restrictions: No       Mobility Bed Mobility Overal bed mobility: Needs Assistance Bed Mobility: Supine to Sit;Sit to Supine     Supine to sit: Min guard     General bed mobility comments: vc for sequencing, use of bed rail, increased time and cues for redirection to task    Transfers Overall transfer level: Needs assistance Equipment used: Rolling walker (2 wheeled) Transfers: Sit to/from [facility] Sit to Stand: Min assist Stand pivot transfers: Min assist       General transfer comment: cues for hand placement and safety, OT had to brace the RW and cues for biomechanics (i.e feet underneath you, lean forward - stick on RLE when you sit for pain management)    Balance Overall balance assessment: Needs assistance Sitting-balance support: No upper extremity supported;Feet supported Sitting balance-Leahy Scale: Good     Standing balance support: No upper extremity supported;Bilateral upper extremity supported;During functional activity Standing balance-Leahy Scale: Poor Standing balance comment: reliant on AD.                           ADL either performed or assessed with clinical judgement   ADL Overall ADL's : Needs assistance/impaired                                       General ADL Comments: only able to maintain standing for brief moment, does better when moving. NEGATIVE for orthostatics, but complains of dizziness  Vision       Perception     Praxis      Cognition Arousal/Alertness: Awake/alert Behavior During Therapy: WFL for tasks assessed/performed Overall Cognitive Status: No family/caregiver present to determine baseline cognitive functioning Area of Impairment: Memory;Safety/judgement                     Memory: Decreased short-term memory   Safety/Judgement: Decreased awareness of safety     General Comments: pt states that she is forgetful. Pt with Poor safety awareness,  hyperverbose requring redirection multple times        Exercises Exercises: Other exercises Other Exercises Other Exercises: educated in use of IS - demonstrated x5 pulling 1100   Shoulder Instructions       General Comments VSS    Pertinent Vitals/ Pain       Pain Assessment: 0-10 Pain Score: 3  Pain Location: R LE surgical sites Pain Descriptors / Indicators: Sore Pain Intervention(s): Monitored during session;Limited activity within patient's tolerance;Repositioned;RN gave pain meds during session  Home Living                                          Prior Functioning/Environment              Frequency  Min 2X/week        Progress Toward Goals  OT Goals(current goals can now be found in the care plan section)  Progress towards OT goals: Progressing toward goals  Acute Rehab OT Goals Patient Stated Goal: "go home, but to rehab if I need to" OT Goal Formulation: With patient Time For Goal Achievement: 03/14/21 Potential to Achieve Goals: Good  Plan Discharge plan remains appropriate;Frequency remains appropriate    Co-evaluation                 AM-PAC OT "6 Clicks" Daily Activity     Outcome Measure   Help from another person eating meals?: None Help from another person taking care of personal grooming?: A Little Help from another person toileting, which includes using toliet, bedpan, or urinal?: A Little Help from another person bathing (including washing, rinsing, drying)?: A Lot Help from another person to put on and taking off regular upper body clothing?: None Help from another person to put on and taking off regular lower body clothing?: A Lot 6 Click Score: 18    End of Session Equipment Utilized During Treatment: Gait belt;Rolling walker  OT Visit Diagnosis: Unsteadiness on feet (R26.81);Other abnormalities of gait and mobility (R26.89);Pain;Other symptoms and signs involving cognitive function Pain - Right/Left:  Right Pain - part of body: Leg   Activity Tolerance Patient tolerated treatment well   Patient Left in chair;with call bell/phone within reach   Nurse Communication Mobility status (no chair alarm, VSS (negative orthostatics))        Time: 7322-0254 OT Time Calculation (min): 42 min  Charges: OT General Charges $OT Visit: 1 Visit OT Treatments $Self Care/Home Management : 23-37 mins $Therapeutic Activity: 8-22 mins  Jesse Sans OTR/L Acute Rehabilitation Services Pager: [phone] Office: [place] 03/01/2021, 11:06 AM

## 2021-03-01 NOTE — TOC Initial Note (Signed)
Transition of Care (TOC) - Initial/Assessment Note  Marvetta Gibbons RN, BSN Transitions of Care Unit 4E- RN Case Manager See Treatment Team for direct phone #    Patient Details  Name: Tammy Graham MRN: [id number] Date of Birth: 01-07-1951  Transition of Care [facility]) CM/SW Contact:    Dawayne Patricia, RN Phone Number: 03/01/2021, 3:32 PM  Clinical Narrative:                 Pt admitted from home s/p fem bypass. Orders placed for [facility] and DME needs.  CM in to speak with pt at bedside. Per pt she states she is not sure she wants anyone coming into her home. She thought someone told her she would be going somewhere for "rehab"- discussed with pt that per PT/OT notes there were no recommendations for her to go to [facility] rehab and that without recommendations for that level of care- insurance would likely not approval a rehab stay. Pt reports she does not really have anyone to assist her much. She does have a friend that will transport home and she speaks about wanting to get back to work as soon as possible. Discussed HH vs outpt therapy- pt then states she does not want to drive to therapy. List for [facility] choice should pt change her mind left with her- Per CMS guidelines from medicare.gov website with star ratings (copy placed in shadow chart)  Also discussed DME needs- pt reports she used to have a rollator and would prefer to have another rollator instead of a regular RW. Also discussed having a 3n1 - pt does want to see what her insurance coverage for DME is and is agreeable to having [employer] check before she agrees to having DME delivered.   Call made to [employer] regarding DME needs- rollator and 3n1- they will run insurance benefits and reach out to patient to see what she wants to do regarding 3n1 and Rollator.   TOC will need to f/u with pt prior to discharge to see if she wants [facility] services- pt for possible d/c over the weekend.   Expected Discharge Plan: [place] Barriers  to Discharge: Continued Medical Work up   Patient Goals and CMS Choice Patient states their goals for this hospitalization and ongoing recovery are:: return home CMS Medicare.gov Compare Post Acute Care list provided to:: Patient Choice offered to / list presented to : Patient  Expected Discharge Plan and Services Expected Discharge Plan: [place]   Discharge Planning Services: CM Consult Post Acute Care Choice: Home Health,Durable Medical Equipment Living arrangements for the past 2 months: Apartment                 DME Arranged: 3-N-1,Walker rolling with seat DME Agency: [employer] Date DME Agency Contacted: 03/01/21 Time DME Agency Contacted: [phone] Representative spoke with at DME Agency: [place] Panorama: PT,OT          Prior Living Arrangements/Services Living arrangements for the past 2 months: Apartment Lives with:: Self Patient language and need for interpreter reviewed:: Yes Do you feel safe going back to the place where you live?: Yes      Need for Family Participation in Patient Care: Yes (Comment) Care giver support system in place?: No (comment)   Criminal Activity/Legal Involvement Pertinent to Current Situation/Hospitalization: No - Comment as needed  Activities of Daily Living Home Assistive Devices/Equipment: Eyeglasses,Dentures (specify type),CBG Meter ADL Screening (condition at time of admission) Patient's cognitive ability  adequate to safely complete daily activities?: Yes Is the patient deaf or have difficulty hearing?: No Does the patient have difficulty seeing, even when wearing glasses/contacts?: No Does the patient have difficulty concentrating, remembering, or making decisions?: No Patient able to express need for assistance with ADLs?: Yes Does the patient have difficulty dressing or bathing?: No Independently performs ADLs?: Yes (appropriate for developmental age) Does the patient have difficulty walking or climbing  stairs?: No Weakness of Legs: Both Weakness of Arms/Hands: None  Permission Sought/Granted Permission sought to share information with : Chartered certified accountant granted to share information with : Yes, Verbal Permission Granted     Permission granted to share info w AGENCY: DME        Emotional Assessment Appearance:: Appears stated age Attitude/Demeanor/Rapport: Engaged Affect (typically observed): Appropriate Orientation: : Oriented to Self,Oriented to Place,Oriented to  Time,Oriented to Situation Alcohol / Substance Use: Not Applicable Psych Involvement: No (comment)  Admission diagnosis:  PAD (peripheral artery disease) ([place]) [I73.9] Patient Active Problem List   Diagnosis Date Noted  . PAD (peripheral artery disease) ([place]) 02/27/2021  . Pain due to onychomycosis of toenails of both feet 07/05/2019  . Right flank pain 07/01/2019  . Dysuria 01/28/2017  . Vaginal itching 01/24/2016  . Midline low back pain without sciatica 01/24/2016  . Type 2 diabetes mellitus without complication, without long-term current use of insulin ([place]) 12/06/2015  . Pap smear for cervical cancer screening 12/06/2015  . Dyslipidemia 11/29/2015  . Cough 11/01/2015  . Hip pain 02/08/2015  . Essential hypertension 03/22/2014  . Mixed hyperlipidemia 03/22/2014  . Tobacco use 03/22/2014  . Type 2 diabetes mellitus ([place]) 03/22/2014  . Anxiety 03/22/2014  . CAD (coronary artery disease) 05/06/2011   PCP:  Charlott Rakes, MD Pharmacy:   [facility] [place], [place] - [address] AT [address] [place] [address] Phone: [phone] Fax: [phone]     Social Determinants of Health (SDOH) Interventions    Readmission Risk Interventions No flowsheet data found.

## 2021-03-01 NOTE — Progress Notes (Signed)
PHARMACIST LIPID MONITORING   Tammy Graham is a 70 y.o. female admitted on 02/27/2021 with with total occlusion of her right SFA.  S/p right SFA to above-knee popliteal artery bypass with ipsilateral, translocated, reversed greater saphenous veinon 02/27/21. Pharmacy has been consulted to optimize lipid-lowering therapy with the indication of secondary prevention for clinical ASCVD.  Recent Labs:  Lipid Panel (last 6 months):   Lab Results  Component Value Date   CHOL 109 02/28/2021   TRIG 59 02/28/2021   HDL 49 02/28/2021   CHOLHDL 2.2 02/28/2021   VLDL 12 02/28/2021   LDLCALC 48 02/28/2021    Hepatic function panel (last 6 months):   Lab Results  Component Value Date   AST 21 02/27/2021   ALT 20 02/27/2021   ALKPHOS 75 02/27/2021   BILITOT 0.9 02/27/2021    SCr (since admission):   Serum creatinine: 1.04 mg/dL (H) 02/28/21 0155 Estimated creatinine clearance: 53.1 mL/min (A)  Current therapy and lipid therapy tolerance Current lipid-lowering therapy: Atorvastatin 10 mg daily  Previous lipid-lowering therapies (if applicable):  Crestor  (can only take brand name Crestor and insurance would not cover cost) Documented or reported allergies or intolerances to lipid-lowering therapies (if applicable): Simvastatin : unknown reaction,  Rosuvastatin caused hives  , can only take brand name Crestor  Assessment:   Patient agrees with changes to lipid-lowering therapy  Plan:   1.Statin intensity (high intensity recommended for all patients regardless of the LDL):  Add or increase statin to high intensity. Increase Atorvastatin dose to 40 mg daily .  2.Add ezetimibe (if any one of the following):   Not indicated at this time.  3.Refer to lipid clinic:   No  4.Follow-up with:  Primary care provider - Charlott Rakes, MD  5.Follow-up labs after discharge:  Changes in lipid therapy were made. Check a lipid panel in 8-12 weeks then annually.      Thank you for allowing  pharmacy to be part of this patients care team.  Nicole Cella, RPh Clinical Pharmacist Please check AMION for all [place] phone numbers After 10:00 PM, call [place] [phone]  03/01/2021, 5:40 PM;ip

## 2021-03-01 NOTE — Progress Notes (Addendum)
  Progress Note    03/01/2021 8:04 AM 2 Days Post-Op  Subjective:  RLE soreness   Vitals:   03/01/21 0259 03/01/21 0754  BP: 98/60 (!) 99/55  Pulse: 80 75  Resp: 16 18  Temp: 98.6 F (37 C) 98.3 F (36.8 C)  SpO2: 90% 97%   Physical Exam: Cardiac: regular Lungs: non labored Incisions:  Right groin and thigh incisions c/d/i. Some ecchymosis present. No hematoma Extremities:  Well perfused and warm with doppler Dp/PT and peroneal signals in RLE Abdomen:  Obese, soft, non tender Neurologic: alert and oriented  CBC    Component Value Date/Time   WBC 13.5 (H) 02/28/2021 0155   RBC 3.61 (L) 02/28/2021 0155   HGB 11.8 (L) 02/28/2021 0155   HGB 15.3 07/01/2019 1550   HCT 34.4 (L) 02/28/2021 0155   HCT 46.5 07/01/2019 1550   PLT 193 02/28/2021 0155   PLT 257 07/01/2019 1550   MCV 95.3 02/28/2021 0155   MCV 98 (H) 07/01/2019 1550   MCH 32.7 02/28/2021 0155   MCHC 34.3 02/28/2021 0155   RDW 12.7 02/28/2021 0155   RDW 12.6 07/01/2019 1550   LYMPHSABS 2.9 09/11/2016 2316   MONOABS 0.4 09/11/2016 2316   EOSABS 0.2 09/11/2016 2316   BASOSABS 0.0 09/11/2016 2316    BMET    Component Value Date/Time   NA 136 02/28/2021 0155   NA 141 02/21/2020 1122   K 4.3 02/28/2021 0155   CL 102 02/28/2021 0155   CO2 26 02/28/2021 0155   GLUCOSE 264 (H) 02/28/2021 0155   BUN 10 02/28/2021 0155   BUN 14 02/21/2020 1122   CREATININE 1.04 (H) 02/28/2021 0155   CREATININE 1.20 (H) 11/01/2015 1459   CALCIUM 9.1 02/28/2021 0155   GFRNONAA 58 (L) 02/28/2021 0155   GFRNONAA 57 (L) 01/11/2014 1022   GFRAA 55 (L) 02/21/2020 1122   GFRAA 66 01/11/2014 1022    INR    Component Value Date/Time   INR 1.0 02/27/2021 1126     Intake/Output Summary (Last 24 hours) at 03/01/2021 0804 Last data filed at 03/01/2021 0300 Gross per 24 hour  Intake 2211.59 ml  Output --  Net 2211.59 ml     Assessment/Plan:  70 y.o. female is s/p  1.Harvest right greater saphenous vein 2.Right SFA  to above-knee popliteal artery bypass with ipsilateral, translocated, reversed greater saphenous vein 2 Days Post-Op. RLE is well perfused and warm. Incisions intact and well appearing. Doppler PT/DP and peroneal signals. VSS. OOB/ Mobilize as tolerated. PT/ OT recommend HHPT/OT. Orders placed. Continue Aspirin and statin. Possible discharge tomorrow if improved mobilization and pain control  DVT prophylaxis: sq heparin   Karoline Caldwell, PA-C [facility] [phone] 03/01/2021 8:04 AM   I have independently interviewed and examined patient and agree with PA assessment and plan above.   Jarriel Papillion C. Donzetta Matters, MD [facility] [place] Office: [phone] Pager: [phone]

## 2021-03-02 ENCOUNTER — Inpatient Hospital Stay (HOSPITAL_COMMUNITY): Payer: BC Managed Care – PPO

## 2021-03-02 LAB — GLUCOSE, CAPILLARY
Glucose-Capillary: 157 mg/dL — ABNORMAL HIGH (ref 70–99)
Glucose-Capillary: 164 mg/dL — ABNORMAL HIGH (ref 70–99)
Glucose-Capillary: 140 mg/dL — ABNORMAL HIGH (ref 70–99)
Glucose-Capillary: 90 mg/dL (ref 70–99)

## 2021-03-02 MED ORDER — NICOTINE 14 MG/24HR TD PT24
14.0000 mg | MEDICATED_PATCH | Freq: Every day | TRANSDERMAL | Status: DC
  Administered 2021-03-02 – 2021-03-06 (×5): 14 mg via TRANSDERMAL
  Filled 2021-03-02 (×5): qty 1

## 2021-03-02 NOTE — Progress Notes (Signed)
Mobility Specialist - Progress Note   03/02/21 1401  Mobility  Activity Ambulated in hall  Level of Assistance Standby assist, set-up cues, supervision of patient - no hands on  Assistive Device Four wheel walker  Distance Ambulated (ft) 440 ft  Mobility Ambulated with assistance in hallway  Mobility Response Tolerated well  Mobility performed by Mobility specialist  $Mobility charge 1 Mobility   Pre-mobility: 67 HR, 97% SpO2 Post-mobility: 82 HR, 98% SpO2  Pt endorsed tightness in groin incision site with ambulation as well as minor lightheadedness. Pt sitting up on side of bed after walk, visitor in room.   Pricilla Handler Mobility Specialist Mobility Specialist Phone: [phone]

## 2021-03-02 NOTE — Plan of Care (Signed)
  Problem: Clinical Measurements: Goal: Respiratory complications will improve Outcome: Progressing Goal: Cardiovascular complication will be avoided Outcome: Progressing   Problem: Health Behavior/Discharge Planning: Goal: Ability to manage health-related needs will improve Outcome: Not Progressing   Problem: Clinical Measurements: Goal: Will remain free from infection Outcome: Not Progressing

## 2021-03-02 NOTE — Progress Notes (Addendum)
Temp 100.1. percocet given for fever and pain. Temp retaken. See doc flow sheets. Patient's right leg incision oozing small amount. Dressing changed. Area around incision site near groin, warm to touch. Will continue to monitor

## 2021-03-02 NOTE — Progress Notes (Addendum)
[facility] [place]  Subjective  - Drainage from medial thigh, pain issues.   Objective 110/64 70 98.7 F (37.1 C) (Oral) 16 97%  Intake/Output Summary (Last 24 hours) at 03/02/2021 0839 Last data filed at 03/02/2021 0016 Gross per 24 hour  Intake 0 ml  Output 400 ml  Net -400 ml    Right medial vein harvest site with ss drainage.  Replaced dry 4 x 4 Groin soft without hematoma Brisk right DP/PT doppler signals Lungs non labored breathing, using IS as of today. Heart RRR    Assessment/Planning: POD # 3  1.Harvest right greater saphenous vein 2.Right SFA to above-knee popliteal artery bypass with ipsilateral, translocated, reversed greater saphenous vein   SAT drops at rest will order chest X ray Tm 100 over night given tylenol, now 98.  Encouraged and demonstrated IS use. Mild medial thigh vein harvest site erythema with pin point ss drainage Mobility with pain issues We will continue to observe her temp, and incisions for drainage.   Roxy Horseman 03/02/2021 8:39 AM --  Laboratory Lab Results: Recent Labs    02/27/21 2024 02/28/21 0155  WBC 15.6* 13.5*  HGB 12.7 11.8*  HCT 37.7 34.4*  PLT 222 193   BMET Recent Labs    02/27/21 1126 02/27/21 2024 02/28/21 0155  NA 139  --  136  K 4.1  --  4.3  CL 104  --  102  CO2 29  --  26  GLUCOSE 150*  --  264*  BUN 9  --  10  CREATININE 0.91 0.95 1.04*  CALCIUM 10.1  --  9.1    COAG Lab Results  Component Value Date   INR 1.0 02/27/2021   INR 0.94 04/07/2011   INR 0.9 09/28/2007   No results found for: PTT   I have independently interviewed and examined patient and agree with PA assessment and plan above.  Low-grade temperature overnight.  She does have some drainage from her medial thigh site on the right but does not appear infected and underlying is a vein.  She is a new oxygen requirement concerning for atelectasis.  We will check chest x-ray today.  Continue  to mobilize out of bed.  Disposition is pending  Jermanie Minshall C. Donzetta Matters, MD [facility] [place] Office: [phone] Pager: [phone]

## 2021-03-03 LAB — GLUCOSE, CAPILLARY
Glucose-Capillary: 105 mg/dL — ABNORMAL HIGH (ref 70–99)
Glucose-Capillary: 139 mg/dL — ABNORMAL HIGH (ref 70–99)
Glucose-Capillary: 139 mg/dL — ABNORMAL HIGH (ref 70–99)
Glucose-Capillary: 68 mg/dL — ABNORMAL LOW (ref 70–99)
Glucose-Capillary: 67 mg/dL — ABNORMAL LOW (ref 70–99)
Glucose-Capillary: 101 mg/dL — ABNORMAL HIGH (ref 70–99)

## 2021-03-03 NOTE — Progress Notes (Addendum)
[facility] [place]  Subjective  - Doing a little better each day with a goal of going home independently.   Objective 110/70 75 98 F (36.7 C) (Oral) 20 98%  Intake/Output Summary (Last 24 hours) at 03/03/2021 0809 Last data filed at 03/03/2021 0755 Gross per 24 hour  Intake 360 ml  Output 1300 ml  Net -940 ml    Right medial thigh SS drainage dressings changed this am at vein harvest site.  C/D/I Groin soft healing well Doppler signals DP/PT intact right foot O2 SAT on RA 93-97 while talking to her this am Lungs non labored breathing  Chest x ray 03/02/21 FINDINGS: Cardiac silhouette upper normal in size for AP technique. Thoracic aorta mildly tortuous. Hilar and mediastinal contours otherwise unremarkable. Lungs clear. Bronchovascular markings normal. Pulmonary vascularity normal. No visible pleural effusions. No pneumothorax.  IMPRESSION: No acute cardiopulmonary disease.     Assessment/Planning: POD # 4 1.Harvest right greater saphenous vein 2.Right SFA to above-knee popliteal artery bypass with ipsilateral, translocated, reversed greater saphenous vein  Continue with mobility and pain control Monitor O2 SATs Monitor incisional drainage dry dressing as needed to medial thigh vein harvest sites. Currently Afebrile and chest  X ray negative.  Encouraged IS Possible discharge tomorrow     Roxy Horseman 03/03/2021 8:09 AM --  Laboratory Lab Results: No results for input(s): WBC, HGB, HCT, PLT in the last 72 hours. BMET No results for input(s): NA, K, CL, CO2, GLUCOSE, BUN, CREATININE, CALCIUM in the last 72 hours.  COAG Lab Results  Component Value Date   INR 1.0 02/27/2021   INR 0.94 04/07/2011   INR 0.9 09/28/2007   No results found for: PTT   I have independently interviewed and examined patient and agree with PA assessment and plan above. Continue to mobilize. Weakly palpable left pt pulse.  Azelie Noguera C.  Donzetta Matters, MD [facility] [place] Office: [phone] Pager: [phone]

## 2021-03-03 NOTE — Progress Notes (Signed)
Mobility Specialist - Progress Note   03/03/21 1709  Mobility  Activity Ambulated to bathroom;Ambulated in hall  Level of Assistance Standby assist, set-up cues, supervision of patient - no hands on  Assistive Device Four wheel walker  Distance Ambulated (ft) 490 ft  Mobility Ambulated with assistance in hallway;Out of bed for toileting  Mobility Response Tolerated well  Mobility performed by Mobility specialist  $Mobility charge 1 Mobility   Pt asx throughout ambulation. Pt back in bed after walk, call bell at side. VSS throughout.  Pricilla Handler Mobility Specialist Mobility Specialist Phone: [phone]

## 2021-03-04 LAB — GLUCOSE, CAPILLARY
Glucose-Capillary: 87 mg/dL (ref 70–99)
Glucose-Capillary: 163 mg/dL — ABNORMAL HIGH (ref 70–99)
Glucose-Capillary: 129 mg/dL — ABNORMAL HIGH (ref 70–99)
Glucose-Capillary: 68 mg/dL — ABNORMAL LOW (ref 70–99)

## 2021-03-04 NOTE — Progress Notes (Addendum)
  Progress Note    03/04/2021 8:01 AM 5 Days Post-Op  Subjective:  States she is scared to go home becomes of pain in RLE. Says she has no help at home   Vitals:   03/04/21 0417 03/04/21 0633  BP: 112/72 103/63  Pulse: 72 68  Resp: 16 16  Temp: 98.2 F (36.8 C)   SpO2: 96% (!) 86%   Physical Exam: Cardiac:  regular Lungs: non labored Incisions: right groin with hematoma but stable. Right thigh incisions are clean, dry and intact Extremities:  Well perfused and warm with doppler right PT/ peroneal doppler signal. Left Dp/ Pt signals Abdomen:  Soft, non distended Neurologic: alert and oriented  CBC    Component Value Date/Time   WBC 13.5 (H) 02/28/2021 0155   RBC 3.61 (L) 02/28/2021 0155   HGB 11.8 (L) 02/28/2021 0155   HGB 15.3 07/01/2019 1550   HCT 34.4 (L) 02/28/2021 0155   HCT 46.5 07/01/2019 1550   PLT 193 02/28/2021 0155   PLT 257 07/01/2019 1550   MCV 95.3 02/28/2021 0155   MCV 98 (H) 07/01/2019 1550   MCH 32.7 02/28/2021 0155   MCHC 34.3 02/28/2021 0155   RDW 12.7 02/28/2021 0155   RDW 12.6 07/01/2019 1550   LYMPHSABS 2.9 09/11/2016 2316   MONOABS 0.4 09/11/2016 2316   EOSABS 0.2 09/11/2016 2316   BASOSABS 0.0 09/11/2016 2316    BMET    Component Value Date/Time   NA 136 02/28/2021 0155   NA 141 02/21/2020 1122   K 4.3 02/28/2021 0155   CL 102 02/28/2021 0155   CO2 26 02/28/2021 0155   GLUCOSE 264 (H) 02/28/2021 0155   BUN 10 02/28/2021 0155   BUN 14 02/21/2020 1122   CREATININE 1.04 (H) 02/28/2021 0155   CREATININE 1.20 (H) 11/01/2015 1459   CALCIUM 9.1 02/28/2021 0155   GFRNONAA 58 (L) 02/28/2021 0155   GFRNONAA 57 (L) 01/11/2014 1022   GFRAA 55 (L) 02/21/2020 1122   GFRAA 66 01/11/2014 1022    INR    Component Value Date/Time   INR 1.0 02/27/2021 1126     Intake/Output Summary (Last 24 hours) at 03/04/2021 0801 Last data filed at 03/04/2021 0419 Gross per 24 hour  Intake 120 ml  Output 750 ml  Net -630 ml      Assessment/Plan:  70 y.o. female is s/p 1.Harvest right greater saphenous vein 2.Right SFA to above-knee popliteal artery bypass with ipsilateral, translocated, reversed greater saphenous vein5 Days Post-Op.  Right lower extremity is well perfused with PT/ Pero doppler signals. Incisions are clean, dry and intact. Small hematoma right groin stable. Encourage continued IS. OOB and mobilize as tolerated. TOC will follow up regarding HHPT/OT options. Stable for discharge when pain controlled  DVT prophylaxis:  Sq heparin   Karoline Caldwell, PA-C [facility] [phone] 03/04/2021 8:01 AM   I have independently interviewed and examined patient and agree with PA assessment and plan above.   Jamian Andujo C. Donzetta Matters, MD [facility] [place] Office: [phone] Pager: [phone]

## 2021-03-04 NOTE — Progress Notes (Signed)
Physical Therapy Treatment Patient Details Name: Tammy Graham MRN: [id number] DOB: Jun 14, 1951 Today's Date: 03/04/2021    History of Present Illness 70 year old female with history of right lower extremity claudication which is life limiting. She underwent attempted endovascular revascularization by an outside interventional radiologist and this was failed. Her symptoms subsequently worsened. S/p harvest right greater saphenous vein, Right SFA to above-knee popliteal artery bypass with ipsilateral, translocated, reversed greater saphenous vein    PT Comments    Pt received seated EOB, agreeable to therapy session and with good participation and tolerance for stair negotiation training with step in room. Pt with decreased recall of sequencing and may benefit from handout to reinforce, she can be impulsive with decreased safety awareness and needs up to min guard for stair/gait progression with and without RW for short distances in room. Emphasis on activity pacing, pain reduction techniques with transfers and stairs, safety with transfers and use of call bell, gradual progression of gait/exercise within tolerance. She could benefit from HEP and stair sequencing handout next session. Pt continues to benefit from PT services to progress toward functional mobility goals. Continue to recommend HHPT.   Follow Up Recommendations  Supervision - Intermittent;Home health PT     Equipment Recommendations  Rolling walker with 5" wheels    Recommendations for Other Services       Precautions / Restrictions Precautions Precautions: Fall Restrictions Weight Bearing Restrictions: No    Mobility  Bed Mobility               General bed mobility comments: pt received and remained seated EOB; bed alarm set at end of session and NT notified    Transfers Overall transfer level: Needs assistance Equipment used: 4-wheeled walker;None Transfers: Sit to/from [employer] to  Stand: Min guard Stand pivot transfers: Supervision       General transfer comment: from EOB<>BSC with no AD, then EOB to rollator to walk to steps; pt impulsive to stand/sit at times to kept close by  Ambulation/Gait Ambulation/Gait assistance: Supervision Gait Distance (Feet): 20 Feet Assistive device: 4-wheeled walker Gait Pattern/deviations: Step-through pattern     General Gait Details: short distance to/from bathroom to get to step set up in room for stair training; pt defers longer gait trial afterward; encouraged her to walk further with mobility tech later in day.   Stairs Stairs: Yes Stairs assistance: Min guard Stair Management: Two rails;Step to pattern;Forwards (RW to simulate bilateral handrails) Number of Stairs: 8 General stair comments: pt needs cues each step for proper sequencing and often placing incorrect foot first. will need reinforcement. no buckling or overt LOB   Wheelchair Mobility    Modified Rankin (Stroke Patients Only)       Balance Overall balance assessment: Needs assistance Sitting-balance support: No upper extremity supported;Feet supported Sitting balance-Leahy Scale: Good     Standing balance support: No upper extremity supported;Bilateral upper extremity supported;During functional activity Standing balance-Leahy Scale: Fair Standing balance comment: able to take steps for pivoting unsupported no LOB (supervision for safety due to pt impulsivity)                            Cognition Arousal/Alertness: Awake/alert Behavior During Therapy: WFL for tasks assessed/performed;Impulsive Overall Cognitive Status: No family/caregiver present to determine baseline cognitive functioning Area of Impairment: Memory;Safety/judgement                     Memory: Decreased short-term  memory   Safety/Judgement: Decreased awareness of safety     General Comments: pt states that she is forgetful. Pt with poor safety  awareness, hyperverbose requring redirection multiple times and tending to abandon rollator, stating "I need to manage on my own at home". Reviewed fall risk prevention while in hospital but she will need reinforcement.      Exercises      General Comments        Pertinent Vitals/Pain Pain Assessment: Faces Faces Pain Scale: Hurts little more Pain Location: R LE surgical sites Pain Descriptors / Indicators: Sore;Discomfort Pain Intervention(s): Monitored during session;Repositioned;RN gave pain meds during session    Home Living                      Prior Function            PT Goals (current goals can now be found in the care plan section) Acute Rehab PT Goals Patient Stated Goal: "I need to take care of myself on my own." PT Goal Formulation: With patient Time For Goal Achievement: 03/14/21 Potential to Achieve Goals: Good Progress towards PT goals: Progressing toward goals    Frequency    Min 3X/week      PT Plan Current plan remains appropriate    Co-evaluation              AM-PAC PT "6 Clicks" Mobility   Outcome Measure  Help needed turning from your back to your side while in a flat bed without using bedrails?: None Help needed moving from lying on your back to sitting on the side of a flat bed without using bedrails?: A Little Help needed moving to and from a bed to a chair (including a wheelchair)?: A Little Help needed standing up from a chair using your arms (e.g., wheelchair or bedside chair)?: A Little Help needed to walk in hospital room?: A Little Help needed climbing 3-5 steps with a railing? : A Little 6 Click Score: 19    End of Session Equipment Utilized During Treatment: Gait belt Activity Tolerance: Patient tolerated treatment well Patient left: in bed;with call bell/phone within reach;with bed alarm set (seated EOB, NT notified) Nurse Communication: Mobility status PT Visit Diagnosis: Other abnormalities of gait and  mobility (R26.89);Pain Pain - Right/Left: Right Pain - part of body: Leg     Time: 4098-1191 PT Time Calculation (min) (ACUTE ONLY): 17 min  Charges:  $Gait Training: 8-22 mins                     Lynett Brasil P., PTA Acute Rehabilitation Services Pager: [phone] Office: [place] 03/04/2021, 5:58 PM

## 2021-03-04 NOTE — Progress Notes (Signed)
Inpatient Diabetes Program Recommendations  AACE/ADA: New Consensus Statement on Inpatient Glycemic Control (2015)  Target Ranges:  Prepandial:   less than 140 mg/dL      Peak postprandial:   less than 180 mg/dL (1-2 hours)      Critically ill patients:  140 - 180 mg/dL   Results for Tammy Graham, Tammy Graham (MRN [id number]) as of 03/04/2021 11:21  Ref. Range 03/03/2021 06:35 03/03/2021 11:13 03/03/2021 17:29 03/03/2021 20:22 03/03/2021 21:28  Glucose-Capillary Latest Ref Range: 70 - 99 mg/dL 105 (H) 139 (H)  2 units NOVOLOG  139 (H)  2 units NOVOLOG  68 (L) 67 (L)   Results for Tammy Graham, Tammy Graham (MRN [id number]) as of 03/04/2021 11:21  Ref. Range 03/04/2021 06:39  Glucose-Capillary Latest Ref Range: 70 - 99 mg/dL 13     Admit for Femoral to popliteal artery bypass   History: DM  Home DM Meds: Glipizide 10 mg Daily       Metformin 1000 mg BID  Current Orders: Glipizide 10 mg Daily      Metformin 1000 mg BID      Novolog Moderate Correction Scale/ SSI (0-15 units) TID AC    MD- Note Hypoglycemia last PM.  Unsure if Glipizide or Novolog SSI caused the low?  Please consider:  1. Stop Glipizide for now (can resume at time of d/c home)  2. Reduce Novolog SSI to the Sensitive (0-9 unit) scale     --Will follow patient during hospitalization--  Wyn Quaker RN, MSN, CDE Diabetes Coordinator Inpatient Glycemic Control Team Team Pager: [phone] (8a-5p)

## 2021-03-04 NOTE — Plan of Care (Signed)

## 2021-03-04 NOTE — Progress Notes (Signed)
Hold coreg and hydrochlothiaze for low bp= 97/50. PA aware. Will continue to monitor the pt  Lavenia Atlas, RN

## 2021-03-05 LAB — GLUCOSE, CAPILLARY
Glucose-Capillary: 64 mg/dL — ABNORMAL LOW (ref 70–99)
Glucose-Capillary: 100 mg/dL — ABNORMAL HIGH (ref 70–99)
Glucose-Capillary: 163 mg/dL — ABNORMAL HIGH (ref 70–99)
Glucose-Capillary: 146 mg/dL — ABNORMAL HIGH (ref 70–99)
Glucose-Capillary: 138 mg/dL — ABNORMAL HIGH (ref 70–99)

## 2021-03-05 MED ORDER — INSULIN ASPART 100 UNIT/ML IJ SOLN
0.0000 [IU] | Freq: Three times a day (TID) | INTRAMUSCULAR | Status: DC
  Administered 2021-03-06: 3 [IU] via SUBCUTANEOUS

## 2021-03-05 NOTE — Progress Notes (Signed)
Occupational Therapy Treatment Patient Details Name: Tammy Graham MRN: [id number] DOB: Aug 22, 1951 Today's Date: 03/05/2021    History of present illness 70 year old female with history of right lower extremity claudication which is life limiting. She underwent attempted endovascular revascularization by an outside interventional radiologist and this was failed. Her symptoms subsequently worsened. S/p harvest right greater saphenous vein, Right SFA to above-knee popliteal artery bypass with ipsilateral, translocated, reversed greater saphenous vein   OT comments  Pt making progress with functional goals. Session focused on ADL functional mobility with RW with min verbal cues for safety, LB ADLs, toilet transfers, toileting tasks, standing at sink for grooming, general ADL mobility safety. Pt hyperverbose and requires min verbal cues for redirection to focus on task continuation and completion. OT will continue to follow acutely to maximize level of function and safety.  Follow Up Recommendations  Home health OT;Supervision - Intermittent    Equipment Recommendations  3 in 1 bedside commode;Other (comment) (RW)    Recommendations for Other Services      Precautions / Restrictions Precautions Precautions: Fall Restrictions Weight Bearing Restrictions: No       Mobility Bed Mobility Overal bed mobility: Needs Assistance Bed Mobility: Supine to Sit;Sit to Supine     Supine to sit: Supervision Sit to supine: Supervision        Transfers Overall transfer level: Needs assistance Equipment used: 4-wheeled walker;None Transfers: Sit to/from [employer] Sit to Stand: Min guard Stand pivot transfers: Supervision;Min guard            Balance Overall balance assessment: Needs assistance Sitting-balance support: No upper extremity supported;Feet supported Sitting balance-Leahy Scale: Good     Standing balance support: No upper extremity supported;Bilateral  upper extremity supported;During functional activity Standing balance-Leahy Scale: Fair                             ADL either performed or assessed with clinical judgement   ADL Overall ADL's : Needs assistance/impaired     Grooming: Wash/dry hands;Wash/dry face;Standing;Supervision/safety       Lower Body Bathing: Minimal assistance;Cueing for safety       Lower Body Dressing: Minimal assistance;Cueing for safety   Toilet Transfer: Min guard;Supervision/safety;Ambulation;RW;Cueing for safety   Toileting- Clothing Manipulation and Hygiene: Min guard;Sit to/from stand;Cueing for safety       Functional mobility during ADLs: Rolling walker;Cueing for safety;Min guard;Supervision/safety       Vision Baseline Vision/History: Wears glasses Wears Glasses: At all times Patient Visual Report: No change from baseline     Perception     Praxis      Cognition Arousal/Alertness: Awake/alert Behavior During Therapy: WFL for tasks assessed/performed;Impulsive Overall Cognitive Status: No family/caregiver present to determine baseline cognitive functioning Area of Impairment: Memory;Safety/judgement                     Memory: Decreased short-term memory   Safety/Judgement: Decreased awareness of safety     General Comments: Pt with poor safety awareness, hyperverbose requring redirection multiple times        Exercises     Shoulder Instructions       General Comments      Pertinent Vitals/ Pain       Pain Assessment: 0-10 Pain Score: 3  Pain Location: R LE surgical sites Pain Descriptors / Indicators: Sore;Discomfort Pain Intervention(s): Limited activity within patient's tolerance;Repositioned  Home Living  Prior Functioning/Environment              Frequency  Min 2X/week        Progress Toward Goals  OT Goals(current goals can now be found in the care plan  section)  Progress towards OT goals: Progressing toward goals  Acute Rehab OT Goals Patient Stated Goal: "I need to take care of myself on my own."  Plan Discharge plan remains appropriate;Frequency remains appropriate    Co-evaluation                 AM-PAC OT "6 Clicks" Daily Activity     Outcome Measure   Help from another person eating meals?: None Help from another person taking care of personal grooming?: A Little Help from another person toileting, which includes using toliet, bedpan, or urinal?: A Little Help from another person bathing (including washing, rinsing, drying)?: A Little Help from another person to put on and taking off regular upper body clothing?: None Help from another person to put on and taking off regular lower body clothing?: A Lot 6 Click Score: 19    End of Session Equipment Utilized During Treatment: Gait belt;Rolling walker  OT Visit Diagnosis: Unsteadiness on feet (R26.81);Other abnormalities of gait and mobility (R26.89);Pain;Other symptoms and signs involving cognitive function Pain - Right/Left: Right Pain - part of body: Leg   Activity Tolerance Patient tolerated treatment well   Patient Left with call bell/phone within reach;in bed;with bed alarm set   Nurse Communication          Time: 7096-2836 OT Time Calculation (min): 26 min  Charges: OT General Charges $OT Visit: 1 Visit OT Treatments $Self Care/Home Management : 8-22 mins $Therapeutic Activity: 8-22 mins     Britt Bottom 03/05/2021, 2:05 PM

## 2021-03-05 NOTE — Progress Notes (Addendum)
  Progress Note    03/05/2021 7:36 AM 6 Days Post-Op  Subjective:  States that she got sick on her stomach yesterday when she tried to get up and move due to pain   Vitals:   03/05/21 0010 03/05/21 0349  BP: 127/69 (!) 102/54  Pulse: 80 69  Resp: 18 18  Temp: 98 F (36.7 C) 98 F (36.7 C)  SpO2: 97% 97%   Physical Exam: Cardiac:  regular Lungs: non labored Incisions: right groin and thigh incisions are clean dry and intact. Small hematoma in right groin stable Extremities:  Well perfused and warm. Dopper Dp/ Pt/ peroneal signals right foot Abdomen: obese, soft, non distended Neurologic:alert and oriented  CBC    Component Value Date/Time   WBC 13.5 (H) 02/28/2021 0155   RBC 3.61 (L) 02/28/2021 0155   HGB 11.8 (L) 02/28/2021 0155   HGB 15.3 07/01/2019 1550   HCT 34.4 (L) 02/28/2021 0155   HCT 46.5 07/01/2019 1550   PLT 193 02/28/2021 0155   PLT 257 07/01/2019 1550   MCV 95.3 02/28/2021 0155   MCV 98 (H) 07/01/2019 1550   MCH 32.7 02/28/2021 0155   MCHC 34.3 02/28/2021 0155   RDW 12.7 02/28/2021 0155   RDW 12.6 07/01/2019 1550   LYMPHSABS 2.9 09/11/2016 2316   MONOABS 0.4 09/11/2016 2316   EOSABS 0.2 09/11/2016 2316   BASOSABS 0.0 09/11/2016 2316    BMET    Component Value Date/Time   NA 136 02/28/2021 0155   NA 141 02/21/2020 1122   K 4.3 02/28/2021 0155   CL 102 02/28/2021 0155   CO2 26 02/28/2021 0155   GLUCOSE 264 (H) 02/28/2021 0155   BUN 10 02/28/2021 0155   BUN 14 02/21/2020 1122   CREATININE 1.04 (H) 02/28/2021 0155   CREATININE 1.20 (H) 11/01/2015 1459   CALCIUM 9.1 02/28/2021 0155   GFRNONAA 58 (L) 02/28/2021 0155   GFRNONAA 57 (L) 01/11/2014 1022   GFRAA 55 (L) 02/21/2020 1122   GFRAA 66 01/11/2014 1022    INR    Component Value Date/Time   INR 1.0 02/27/2021 1126     Intake/Output Summary (Last 24 hours) at 03/05/2021 0736 Last data filed at 03/04/2021 1602 Gross per 24 hour  Intake 240 ml  Output --  Net 240 ml      Assessment/Plan:  70 y.o. female is s/p  1.Harvest right greater saphenous vein 2.Right SFA to above-knee popliteal artery bypass with ipsilateral, translocated, reversed greater saphenous vein6 Days Post-Op.  Right lower extremity remains well perfused with PT/ Dp/ Pero doppler signals. Incisions are clean, dry and intact. Small hematoma right groin remains stable. Some hypotension yesterday and hypoglycemia. Held home BP meds. VSS this morning. Will make adjustments in SSI today. Encourage her to get OOB and mobilize. TOC will follow up regarding HHPT/OT options. Patient feels that she likely is not interested in [facility] services- prior bad experiences. Stable for discharge when pain controlled. Says feels she needs one more day as she lives alone. Has RN friend who is able to come pick her up tomorrow   DVT prophylaxis: sq heparin   Karoline Caldwell, [place] [facility] [phone] 03/05/2021 7:36 AM   I have independently interviewed and examined patient and agree with PA assessment and plan above. Strong right pt signal. Incisions healing well.   Shardai Star C. Donzetta Matters, MD [facility] [place] Office: [phone] Pager: [phone]

## 2021-03-05 NOTE — Progress Notes (Signed)
Mobility Specialist: Progress Note   03/05/21 1519  Mobility  Activity Ambulated in hall  Level of Assistance Independent after set-up  Assistive Device Four wheel walker  Distance Ambulated (ft) 470 ft  Mobility Ambulated independently in hallway  Mobility Response Tolerated well  Mobility performed by Mobility specialist  $Mobility charge 1 Mobility   Pre-Mobility: 75 HR Post-Mobility: 80 HR  Pt to BR and then agreeable to ambulation, asx. Pt to bed after walk per request to take a nap. Call bell is at her side.   [facility] Charlynn Salih Mobility Specialist Mobility Specialist Phone: [phone]

## 2021-03-06 ENCOUNTER — Other Ambulatory Visit (HOSPITAL_COMMUNITY): Payer: Self-pay

## 2021-03-06 LAB — GLUCOSE, CAPILLARY
Glucose-Capillary: 105 mg/dL — ABNORMAL HIGH (ref 70–99)
Glucose-Capillary: 192 mg/dL — ABNORMAL HIGH (ref 70–99)

## 2021-03-06 MED ORDER — OXYCODONE-ACETAMINOPHEN 5-325 MG PO TABS
1.0000 | ORAL_TABLET | ORAL | 0 refills | Status: DC | PRN
  Filled 2021-03-06: qty 30, 5d supply, fill #0

## 2021-03-06 MED ORDER — NICOTINE 14 MG/24HR TD PT24
14.0000 mg | MEDICATED_PATCH | TRANSDERMAL | 0 refills | Status: AC
  Filled 2021-03-06: qty 28, 28d supply, fill #0
  Filled 2021-03-06: qty 30, 30d supply, fill #0

## 2021-03-06 NOTE — Progress Notes (Signed)
Physical Therapy Treatment Patient Details Name: Tammy Graham MRN: [id number] DOB: 1950-11-26 Today's Date: 03/06/2021    History of Present Illness 70 year old female with history of right lower extremity claudication which is life limiting. She underwent attempted endovascular revascularization by an outside interventional radiologist and this was failed. Her symptoms subsequently worsened. S/p harvest right greater saphenous vein, Right SFA to above-knee popliteal artery bypass with ipsilateral, translocated, reversed greater saphenous vein    PT Comments    Pt received in supine, agreeable to therapy session and with good tolerance for gait progression with rollator. Pt with improved safety this date but still needs some cues for use of brakes on rollator and activity pacing. Reviewed pain reduction strategies and HEP/stair safety with handouts given to reinforce instructions. Pt continues to benefit from PT services to progress toward functional mobility goals. Updated DME recs to include tub transfer bench as pt doesn't feel comfortable moving 3 in 1 from toilet surround to shower daily on her own.    Follow Up Recommendations  Supervision - Intermittent;Home health PT     Equipment Recommendations  Rolling walker with 5" wheels;Other (comment) (tub transfer bench)    Recommendations for Other Services       Precautions / Restrictions Precautions Precautions: Fall Restrictions Weight Bearing Restrictions: No    Mobility  Bed Mobility Overal bed mobility: Needs Assistance Bed Mobility: Supine to Sit     Supine to sit: Modified independent (Device/Increase time)     General bed mobility comments: use of bed features; remained seated EOB at end of session    Transfers Overall transfer level: Needs assistance Equipment used: 4-wheeled walker Transfers: Sit to/from Stand Sit to Stand: Supervision         General transfer comment: no physical assist needed; given  instruction on importance of brakes when using rollator; may need reinforcement  Ambulation/Gait Ambulation/Gait assistance: Modified independent (Device/Increase time) Gait Distance (Feet): 300 Feet Assistive device: 4-wheeled walker Gait Pattern/deviations: Step-through pattern Gait velocity: decreased; grossly 0.2-0.4 m/s Gait velocity interpretation: <1.8 ft/sec, indicate of risk for recurrent falls General Gait Details: no LOB, good use of rollator, did not need seated break to achieve distance but did take 2-3 standing breaks briefly; HR 70-80's bpm with exertion   Stairs Stairs:  (verbal review for technique and handout given, pt able to repeat back sequence)           Wheelchair Mobility    Modified Rankin (Stroke Patients Only)       Balance Overall balance assessment: Needs assistance Sitting-balance support: No upper extremity supported;Feet supported Sitting balance-Leahy Scale: Good     Standing balance support: No upper extremity supported;Bilateral upper extremity supported;During functional activity Standing balance-Leahy Scale: Fair Standing balance comment: able to static stand no LOB but does report intermittent dizziness; BP stable; rollator for mobility progression due to LE pain                            Cognition Arousal/Alertness: Awake/alert Behavior During Therapy: WFL for tasks assessed/performed Overall Cognitive Status: No family/caregiver present to determine baseline cognitive functioning Area of Impairment: Memory;Problem solving                     Memory: Decreased short-term memory (forgets stair sequencing; brought handout to reinforce)       Problem Solving: Requires verbal cues (mostly for transfer safety with AD or stair sequencing; limited carryover) General Comments: Pt  frustrated with some staff members, hoping to leave today. Improved safety/less impulsive this date.      Exercises Other  Exercises Other Exercises: pt given HEP handout to take home as she reports she may refuse HHPT; Discussed role of HHPT and likely progression of activity, encouraged her to consider this to work within her mobility goals    General Comments General comments (skin integrity, edema, etc.): BP 104/68 (78) supine; BP 114/64 (79) standing (dizzy briefly but no LOB and improves quickly); HR WNL with exertion; pt expressed frustration with staff.      Pertinent Vitals/Pain Pain Assessment: Faces Faces Pain Scale: Hurts little more Pain Location: R LE surgical sites, worst at groin Pain Descriptors / Indicators: Sore;Discomfort Pain Intervention(s): Monitored during session;Premedicated before session;Repositioned;Patient requesting pain meds-RN notified (pt refusing to try ice pack for the pain)    Home Living                      Prior Function            PT Goals (current goals can now be found in the care plan section) Acute Rehab PT Goals Patient Stated Goal: "I need to take care of myself on my own." PT Goal Formulation: With patient Time For Goal Achievement: 03/14/21 Potential to Achieve Goals: Good Progress towards PT goals: Progressing toward goals    Frequency    Min 3X/week      PT Plan Equipment recommendations need to be updated;Current plan remains appropriate    Co-evaluation              AM-PAC PT "6 Clicks" Mobility   Outcome Measure  Help needed turning from your back to your side while in a flat bed without using bedrails?: None Help needed moving from lying on your back to sitting on the side of a flat bed without using bedrails?: None Help needed moving to and from a bed to a chair (including a wheelchair)?: A Little Help needed standing up from a chair using your arms (e.g., wheelchair or bedside chair)?: A Little Help needed to walk in hospital room?: None Help needed climbing 3-5 steps with a railing? : A Little 6 Click Score: 21     End of Session Equipment Utilized During Treatment: Gait belt Activity Tolerance: Patient tolerated treatment well Patient left: in bed;with call bell/phone within reach (seated EOB, requesting pain meds) Nurse Communication: Mobility status;Patient requests pain meds PT Visit Diagnosis: Other abnormalities of gait and mobility (R26.89);Pain Pain - Right/Left: Right Pain - part of body: Leg (groin)     Time: 5852-7782 PT Time Calculation (min) (ACUTE ONLY): 18 min  Charges:  $Gait Training: 8-22 mins                     Kasi Lasky P., PTA Acute Rehabilitation Services Pager: [phone] Office: [place] 03/06/2021, 11:03 AM

## 2021-03-06 NOTE — Plan of Care (Signed)

## 2021-03-06 NOTE — Progress Notes (Signed)
D/c tele and IV. Went over AVS with pt and her friend,  and all questions were answered.   Lavenia Atlas, RN

## 2021-03-06 NOTE — TOC Transition Note (Signed)
Transition of Care (TOC) - CM/SW Discharge Note Marvetta Gibbons RN, BSN Transitions of Care Unit 4E- RN Case Manager See Treatment Team for direct phone #    Patient Details  Name: Tammy Graham MRN: [id number] Date of Birth: May 22, 1951  Transition of Care [facility]) CM/SW Contact:  Dawayne Patricia, RN Phone Number: 03/06/2021, 1:59 PM   Clinical Narrative:    Noted pt ready for transition home, f/u done with pt regarding [place] needs. Pt is re-considering having [place] come into her home, as looked over list but states she would like her friend [name] look over list as well. [name] called while this Probation officer in the room and states she will be here around 1:30 to assist with transporting pt home today. Will plan to return to room to speak with [place] when she arrives.   DME has been delivered to the room- rollator and 3n1. Pt also asking about a shower chair as she does not want to use the 3n1 for a shower chair. Explained to pt that her insurance will not cover the cost for a shower chair and this would be an out of pocket cost. Pt states she will have to think about it.   1345- Returned to room, Sabrina at the bedside assisting pt in getting dressed. Notified bedside RN - so that attending could be notified regarding discharge. Discussed HH and DME needs again with pt and [place]. Patient has provided [facility] choice list to [name] to review- per [place] she suggest [place] to pt, and pt agreeable to this.  Also discussed need for shower chair with [name] who states she will help pt get one from outside store.   Call made to [facility] with Alvis Lemmings for [facility] PT/OT referral - return call pending.   Final next level of care: [place] Barriers to Discharge: Barriers Resolved   Patient Goals and CMS Choice Patient states their goals for this hospitalization and ongoing recovery are:: return home CMS Medicare.gov Compare Post Acute Care list provided to:: Patient Choice offered to / list  presented to : Patient  Discharge Placement               Home with [facility]        Discharge Plan and Services   Discharge Planning Services: CM Consult Post Acute Care Choice: Home Health,Durable Medical Equipment          DME Arranged: 3-N-1,Walker rolling with seat DME Agency: [employer] Date DME Agency Contacted: 03/01/21 Time DME Agency Contacted: [id number] Representative spoke with at DME Agency: Sheila Disney: PT,OT [place] Agency: [place] Date [place]: 03/06/21 Time [place]: [id number] Representative spoke with at [place]: [place] (Fernando [place]) Interventions     Readmission Risk Interventions Readmission Risk Prevention Plan 03/06/2021  Post Dischage Appt Complete  Medication Screening Complete  Transportation Screening Complete  Some recent data might be hidden

## 2021-03-06 NOTE — Progress Notes (Addendum)
  Progress Note    03/06/2021 8:25 AM 7 Days Post-Op  Subjective:  Pain overnight RLE   Vitals:   03/06/21 0422 03/06/21 0750  BP: 107/67 99/61  Pulse: 69 71  Resp: 15 17  Temp: 98.2 F (36.8 C) 97.9 F (36.6 C)  SpO2: 100% 92%   Physical Exam: Lungs:  Non labored Incisions:  R groin and RLE incisions c/d/i Extremities:  Palpable R DP pulse Neurologic: A&O  CBC    Component Value Date/Time   WBC 13.5 (H) 02/28/2021 0155   RBC 3.61 (L) 02/28/2021 0155   HGB 11.8 (L) 02/28/2021 0155   HGB 15.3 07/01/2019 1550   HCT 34.4 (L) 02/28/2021 0155   HCT 46.5 07/01/2019 1550   PLT 193 02/28/2021 0155   PLT 257 07/01/2019 1550   MCV 95.3 02/28/2021 0155   MCV 98 (H) 07/01/2019 1550   MCH 32.7 02/28/2021 0155   MCHC 34.3 02/28/2021 0155   RDW 12.7 02/28/2021 0155   RDW 12.6 07/01/2019 1550   LYMPHSABS 2.9 09/11/2016 2316   MONOABS 0.4 09/11/2016 2316   EOSABS 0.2 09/11/2016 2316   BASOSABS 0.0 09/11/2016 2316    BMET    Component Value Date/Time   NA 136 02/28/2021 0155   NA 141 02/21/2020 1122   K 4.3 02/28/2021 0155   CL 102 02/28/2021 0155   CO2 26 02/28/2021 0155   GLUCOSE 264 (H) 02/28/2021 0155   BUN 10 02/28/2021 0155   BUN 14 02/21/2020 1122   CREATININE 1.04 (H) 02/28/2021 0155   CREATININE 1.20 (H) 11/01/2015 1459   CALCIUM 9.1 02/28/2021 0155   GFRNONAA 58 (L) 02/28/2021 0155   GFRNONAA 57 (L) 01/11/2014 1022   GFRAA 55 (L) 02/21/2020 1122   GFRAA 66 01/11/2014 1022    INR    Component Value Date/Time   INR 1.0 02/27/2021 1126     Intake/Output Summary (Last 24 hours) at 03/06/2021 0825 Last data filed at 03/06/2021 0442 Gross per 24 hour  Intake 120 ml  Output 1000 ml  Net -880 ml     Assessment/Plan:  70 y.o. female is s/p R femoral to AK popliteal bypass with vein 7 Days Post-Op   R foot well perfused with palpable DP pulse TOC arranging home health PT Likely d/c home later today   Dagoberto Ligas, PA-C [facility] [phone] 03/06/2021 8:25 AM  I have independently interviewed and examined patient and agree with PA assessment and plan above. [place] for [employer] today.   Kataleena Holsapple C. Donzetta Matters, MD [facility] [place] Office: [phone] Pager: [phone]

## 2021-03-06 NOTE — TOC Transition Note (Addendum)
Transition of Care [facility]) - CM/SW Discharge Note   Patient Details  Name: Tammy Graham MRN: [id number] Date of Birth: [birth year]  Transition of Care [facility]) CM/SW Contact:  Carles Collet, RN Phone Number: 03/06/2021, 3:24 PM   Clinical Narrative:    Luc[facility] Shutters declined. Updated [name], preference would be [place]. [place] declined [facility]- called twice no answer, left message with liaison with no response. Called [place] branch and faxed referral. Waiting on call back. No response [facility]- ACCEPTED        [place], [place]   [phone]       Final next level of care: [place] Barriers to Discharge: Barriers Resolved   Patient Goals and CMS Choice Patient states their goals for this hospitalization and ongoing recovery are:: return home CMS Medicare.gov Compare Post Acute Care list provided to:: Patient Choice offered to / list presented to : Patient  Discharge Placement                       Discharge Plan and Services   Discharge Planning Services: CM Consult Post Acute Care Choice: Home Health,Durable Medical Equipment          DME Arranged: 3-N-1,Walker rolling with seat DME Agency: [employer] Date DME Agency Contacted: 03/01/21 Time DME Agency Contacted: [phone] Representative spoke with at DME Agency: Sheila [place]: PT,OT [place] Agency: [place] Date [place]: 03/06/21 Time [place]: 1607 Representative spoke with at [place]: [place] ([place]) Interventions     Readmission Risk Interventions Readmission Risk Prevention Plan 03/06/2021  Post Dischage Appt Complete  Medication Screening Complete  Transportation Screening Complete  Some recent data might be hidden

## 2021-03-07 ENCOUNTER — Telehealth: Payer: Self-pay

## 2021-03-07 NOTE — Discharge Summary (Signed)
Bypass Discharge Summary Patient ID: Tammy Graham [id number] 70 y.o. 11-23-1950  Admit date: 02/27/2021  Discharge date and time: 03/06/2021  4:28 PM   Admitting Physician: Tammy Sandy, MD   Discharge Physician: Tammy Graham  Admission Diagnoses: PAD (peripheral artery disease) [facility]) [I73.9]  Discharge Diagnoses: same  Admission Condition: fair  Discharged Condition: fair  Indication for Admission: Lifestyle limiting claudication of right lower extremity  Hospital Course: Ms. Tammy Graham is a 70 year old female who was brought in as an outpatient for right SFA to above-the-knee popliteal artery bypass with greater saphenous vein by Dr. Donzetta Graham on 02/27/2021 due to lifestyle limiting claudication of the right lower extremity.  She tolerated the procedure well and was admitted to the hospital postoperatively.  The patient lives alone with only minimal support at this remained in the hospital for several extra days.  At the time of discharge all incisions were healing well and patient had a palpable DP pulse in her right foot.  Based on therapy recommendations the transitions of care team arranged home health physical therapy.  She will follow-up in office in 2 to 3 weeks.  She was prescribed 2 to 3 days of narcotic pain medication for continued postoperative pain control as well as a nicotine patch to help with smoking cessation.  She was also prescribed and provided a rolling walker and a 3 and 1 bedside commode.  She was discharged home in stable condition.  Consults: None  Treatments: surgery: Right SFA to above-the-knee popliteal artery bypass with greater saphenous vein by Dr. Donzetta Graham on 02/27/2021    Disposition: Discharge disposition: 01-Home or Self Care       - For [facility] Registry use ---  Post-op:  Wound infection: No  Graft infection: No  Transfusion: No   New Arrhythmia: No Patency judged by: [ ]  Dopper only, [ ]  Palpable graft pulse, [x]  Palpable distal pulse,  [ ]  ABI inc. > 0.15, [ ]  Duplex D/C Ambulatory Status: Ambulatory  Complications: MI: [x ] No, [ ]  Troponin only, [ ]  EKG or Clinical CHF: No Resp failure: [x ] none, [ ]  Pneumonia, [ ]  Ventilator Chg in renal function: [x ] none, [ ]  Inc. Cr > 0.5, [ ]  Temp. Dialysis, [ ]  Permanent dialysis Stroke: [ x] None, [ ]  Minor, [ ]  Major Return to OR: No  Reason for return to OR: [ ]  Bleeding, [ ]  Infection, [ ]  Thrombosis, [ ]  Revision  Discharge medications: Statin use:  Yes ASA use:  Yes Plavix use:  No  for medical reason Not indicated Beta blocker use: Yes Coumadin use: No  for medical reason Not indicated    Patient Instructions:  Allergies as of 03/06/2021      Reactions   Benadryl [diphenhydramine Hcl (sleep)] Other (See Comments)   unknown   Rosuvastatin Hives   Patient is allergic to generic rosuvastatin. She says she is able to take branded Crestor.    Simvastatin Other (See Comments)   unknown      Medication List    STOP taking these medications   acetaminophen-codeine 300-30 MG tablet Commonly known as: TYLENOL #3   amLODipine 10 MG tablet Commonly known as: NORVASC     TAKE these medications   Accu-Chek FastClix Lancets Misc USE ONCE DAILY AS DIRECTED   Accu-Chek Guide Me w/Device Kit 1 kit by Does not apply route daily. Use to check blood sugar daily.   Accu-Chek Guide test strip Generic drug: glucose blood USE AS INSTRUCTED  Accu-Chek Aviva Plus test strip Generic drug: glucose blood USE AS INSTRUCTED   glucose blood test strip Use as instructed   aspirin EC 81 MG tablet Take 81 mg by mouth in the morning. Swallow whole.   atorvastatin 10 MG tablet Commonly known as: LIPITOR Take 1 tablet (10 mg total) by mouth daily.   carvedilol 12.5 MG tablet Commonly known as: COREG TAKE 1 TABLET(12.5 MG) BY MOUTH TWICE DAILY WITH A MEAL What changed:   how much to take  how to take this  when to take this  additional instructions    gabapentin 300 MG capsule Commonly known as: NEURONTIN Take 300 mg by mouth at bedtime.   glipiZIDE 10 MG tablet Commonly known as: GLUCOTROL TAKE 1 TABLET(10 MG) BY MOUTH DAILY BEFORE BREAKFAST What changed: See the new instructions.   hydrochlorothiazide 25 MG tablet Commonly known as: HYDRODIURIL Take 1 tablet (25 mg total) by mouth daily. What changed: when to take this   Lubricant Drops/Dual-Action 0.5-0.9 % ophthalmic solution Generic drug: carboxymethylcellul-glycerin Place 1-2 drops into both eyes 3 (three) times daily as needed for dry eyes.   metFORMIN 1000 MG tablet Commonly known as: GLUCOPHAGE Take 1 tablet (1,000 mg total) by mouth 2 (two) times daily with a meal.   Misc. Devices Misc Shower bench, Above the toilet seat.  Diagnosis- Type 2 Diabetes Mellitus   multivitamin with minerals Tabs tablet Take 1 tablet by mouth in the morning.   nicotine 14 mg/24hr patch Commonly known as: NICODERM CQ - dosed in mg/24 hours Place 1 patch (14 mg total) onto the skin daily.   nitroGLYCERIN 0.4 MG SL tablet Commonly known as: NITROSTAT Place 0.4 mg under the tongue every 5 (five) minutes as needed for chest pain.   oxyCODONE-acetaminophen 5-325 MG tablet Commonly known as: PERCOCET/ROXICET Take 1 tablet by mouth every 4 (four) hours as needed for moderate pain or severe pain.   PRESCRIPTION MEDICATION Apply 1 application topically in the morning and at bedtime. Kamea Foot Cream            Discharge Care Instructions  (From admission, onward)         Start     Ordered   03/06/21 0000  Discharge wound care:       Comments: Wash incisions daily with mild soap and water, pat dry. Do not apply any topical ointments or lotions   03/06/21 1429         Activity: activity as tolerated Diet: regular diet Wound Care: keep wound clean and dry  Follow-up with [facility] in 3 weeks.  SignedDagoberto Graham 03/07/2021 1:45 PM

## 2021-03-07 NOTE — Telephone Encounter (Signed)
   From the discharge call.   She said she is feeling okay. Her daughter's best friend, [name], is a Marine scientist and has helped her with the transition home     the patient said she wants to put the home health services on hold and [name] is handling all of this for her.  She has printed exercises to do and does not think she is ready for home therapy.   She has 2 glucometers but said that they don't always work. Instructed her to have [place] look at the machines to see if she can determine what is going on with them.    Appointment with  Dr Margarita Rana 05/28/2021. She has the phone number for the clinic and will call if she wants to be seen sooner.    Appointment with [facility] 03/22/2021.

## 2021-03-07 NOTE — Telephone Encounter (Signed)
Transition Care Management Follow-up Telephone Call  Date of discharge and from where: 03/06/2021 - [facility]  How have you been since you were released from the hospital? She said she is feeling okay. Her daughter's best friend, [name], is a Marine scientist and has helped her with the transition home   Any questions or concerns? No  Items Reviewed:  Did the pt receive and understand the discharge instructions provided? Yes - she said her friend, [name], reviewed them for her   Medications obtained and verified? Yes  - she did not have any questions about the med regime and said [name] made sure that she has everything   Other? No   Any new allergies since your discharge? No   Do you have support at home? Yes   Home Care and Equipment/Supplies: Were home health services ordered? yes If so, what is the name of the agency? [facility]  Has the agency set up a time to come to the patient's home? No and the patient said she wants to put the services on hold and [name] is handling all of this for her.   She has printed exercises to do and does not think she is ready for home therapy. Were any new equipment or medical supplies ordered?  Yes: rollator and 3:1 commode What is the name of the medical supply agency? [employer] Were you able to get the supplies/equipment? yes Do you have any questions related to the use of the equipment or supplies? No   She has 2 glucometers but said that they don't always work. Instructed her to have [place] look at the machines to see if she can determine what is going on with them.   Functional Questionnaire: (I = Independent and D = Dependent) ADLs: has rollator to use with ambulation. She is trying to be as independent as possible. She does have help to assist if needed  Follow up appointments reviewed:   PCP Hospital f/u appt confirmed? Yes  - Dr Margarita Rana 05/28/2021. She has the phone number for the clinic and will call if she wants to be seen  sooner.  [facility] f/u appt confirmed? Yes  - [facility] 03/22/2021.    Are transportation arrangements needed? No  - She has someone that will take her to appointments and she prefers to have someone accompany her  If their condition worsens, is the pt aware to call PCP or go to the Emergency Dept.? Yes  Was the patient provided with contact information for the PCP's office or ED? Yes  Was to pt encouraged to call back with questions or concerns? Yes

## 2021-03-12 DIAGNOSIS — Z48812 Encounter for surgical aftercare following surgery on the circulatory system: Secondary | ICD-10-CM | POA: Diagnosis not present

## 2021-03-12 DIAGNOSIS — Z7982 Long term (current) use of aspirin: Secondary | ICD-10-CM | POA: Diagnosis not present

## 2021-03-12 DIAGNOSIS — M1711 Unilateral primary osteoarthritis, right knee: Secondary | ICD-10-CM | POA: Diagnosis not present

## 2021-03-12 DIAGNOSIS — I2510 Atherosclerotic heart disease of native coronary artery without angina pectoris: Secondary | ICD-10-CM | POA: Diagnosis not present

## 2021-03-12 DIAGNOSIS — Z9181 History of falling: Secondary | ICD-10-CM | POA: Diagnosis not present

## 2021-03-12 DIAGNOSIS — I10 Essential (primary) hypertension: Secondary | ICD-10-CM | POA: Diagnosis not present

## 2021-03-12 DIAGNOSIS — E1151 Type 2 diabetes mellitus with diabetic peripheral angiopathy without gangrene: Secondary | ICD-10-CM | POA: Diagnosis not present

## 2021-03-12 DIAGNOSIS — Z79891 Long term (current) use of opiate analgesic: Secondary | ICD-10-CM | POA: Diagnosis not present

## 2021-03-12 DIAGNOSIS — Z7984 Long term (current) use of oral hypoglycemic drugs: Secondary | ICD-10-CM | POA: Diagnosis not present

## 2021-03-12 DIAGNOSIS — Z95820 Peripheral vascular angioplasty status with implants and grafts: Secondary | ICD-10-CM | POA: Diagnosis not present

## 2021-03-13 ENCOUNTER — Telehealth: Payer: Self-pay | Admitting: Family Medicine

## 2021-03-13 NOTE — Telephone Encounter (Signed)
Will route to PCP for review. 

## 2021-03-13 NOTE — Telephone Encounter (Signed)
Copied from [place] [id number]. Topic: General - Other >> Mar 13, 2021  2:49 PM [place] wrote: Reason for CRM: Rosann Auerbach with [facility] is calling in just to state that pt has declined OT and states does not need services. [place] notifying of pt wishes.

## 2021-03-18 DIAGNOSIS — Z7982 Long term (current) use of aspirin: Secondary | ICD-10-CM | POA: Diagnosis not present

## 2021-03-18 DIAGNOSIS — Z48812 Encounter for surgical aftercare following surgery on the circulatory system: Secondary | ICD-10-CM | POA: Diagnosis not present

## 2021-03-18 DIAGNOSIS — M1711 Unilateral primary osteoarthritis, right knee: Secondary | ICD-10-CM | POA: Diagnosis not present

## 2021-03-18 DIAGNOSIS — I2510 Atherosclerotic heart disease of native coronary artery without angina pectoris: Secondary | ICD-10-CM | POA: Diagnosis not present

## 2021-03-18 DIAGNOSIS — Z7984 Long term (current) use of oral hypoglycemic drugs: Secondary | ICD-10-CM | POA: Diagnosis not present

## 2021-03-18 DIAGNOSIS — I10 Essential (primary) hypertension: Secondary | ICD-10-CM | POA: Diagnosis not present

## 2021-03-18 DIAGNOSIS — E1151 Type 2 diabetes mellitus with diabetic peripheral angiopathy without gangrene: Secondary | ICD-10-CM | POA: Diagnosis not present

## 2021-03-18 DIAGNOSIS — Z79891 Long term (current) use of opiate analgesic: Secondary | ICD-10-CM | POA: Diagnosis not present

## 2021-03-18 DIAGNOSIS — Z9181 History of falling: Secondary | ICD-10-CM | POA: Diagnosis not present

## 2021-03-18 DIAGNOSIS — Z95820 Peripheral vascular angioplasty status with implants and grafts: Secondary | ICD-10-CM | POA: Diagnosis not present

## 2021-03-20 DIAGNOSIS — I2510 Atherosclerotic heart disease of native coronary artery without angina pectoris: Secondary | ICD-10-CM | POA: Diagnosis not present

## 2021-03-20 DIAGNOSIS — Z48812 Encounter for surgical aftercare following surgery on the circulatory system: Secondary | ICD-10-CM | POA: Diagnosis not present

## 2021-03-20 DIAGNOSIS — I10 Essential (primary) hypertension: Secondary | ICD-10-CM | POA: Diagnosis not present

## 2021-03-20 DIAGNOSIS — Z79891 Long term (current) use of opiate analgesic: Secondary | ICD-10-CM | POA: Diagnosis not present

## 2021-03-20 DIAGNOSIS — Z7982 Long term (current) use of aspirin: Secondary | ICD-10-CM | POA: Diagnosis not present

## 2021-03-20 DIAGNOSIS — Z95820 Peripheral vascular angioplasty status with implants and grafts: Secondary | ICD-10-CM | POA: Diagnosis not present

## 2021-03-20 DIAGNOSIS — E1151 Type 2 diabetes mellitus with diabetic peripheral angiopathy without gangrene: Secondary | ICD-10-CM | POA: Diagnosis not present

## 2021-03-20 DIAGNOSIS — Z9181 History of falling: Secondary | ICD-10-CM | POA: Diagnosis not present

## 2021-03-20 DIAGNOSIS — Z7984 Long term (current) use of oral hypoglycemic drugs: Secondary | ICD-10-CM | POA: Diagnosis not present

## 2021-03-20 DIAGNOSIS — M1711 Unilateral primary osteoarthritis, right knee: Secondary | ICD-10-CM | POA: Diagnosis not present

## 2021-03-22 ENCOUNTER — Other Ambulatory Visit: Payer: Self-pay

## 2021-03-22 ENCOUNTER — Other Ambulatory Visit: Payer: Self-pay | Admitting: Family Medicine

## 2021-03-22 ENCOUNTER — Ambulatory Visit (INDEPENDENT_AMBULATORY_CARE_PROVIDER_SITE_OTHER): Payer: BC Managed Care – PPO | Admitting: Physician Assistant

## 2021-03-22 ENCOUNTER — Telehealth (HOSPITAL_COMMUNITY): Payer: Self-pay

## 2021-03-22 ENCOUNTER — Encounter: Payer: Self-pay | Admitting: Physician Assistant

## 2021-03-22 VITALS — BP 137/78 | HR 74 | Temp 98.6°F | Resp 20 | Ht 66.5 in | Wt 160.9 lb

## 2021-03-22 DIAGNOSIS — I739 Peripheral vascular disease, unspecified: Secondary | ICD-10-CM

## 2021-03-22 MED ORDER — TRAMADOL HCL 50 MG PO TABS: 50.0000 mg | ORAL_TABLET | Freq: Four times a day (QID) | ORAL | 0 refills | Status: DC | PRN

## 2021-03-22 NOTE — Telephone Encounter (Signed)
All paper work for patient was filled out and faxed to [phone]. Conformation was received and paper work was left up front for patient to pick up.    [facility]

## 2021-03-22 NOTE — Telephone Encounter (Signed)
Requested medications are due for refill today yes  Requested medications are on the active medication list yes  Last refill 4/29  Last visit 01/24/21  Future visit scheduled 05/28/21  Notes to clinic Historical Provider

## 2021-03-22 NOTE — Progress Notes (Signed)
POST OPERATIVE OFFICE NOTE    CC:  F/u for surgery  HPI:  This is a 70 y.o. female who presented with symptoms of sever claudication with occluded SFA on angiogram.  She is s/p right common femoral to above knee pop bypass on 02/27/21 by Dr. Donzetta Matters.    At the time of discharge all incisions were healing well and patient had a palpable DP pulse in her right foot.   Pt returns today for follow up.  Pt states she has mild discomfort right LE.   Edema on/off depending on activity.  She denise fever and chills.    Allergies  Allergen Reactions  . Benadryl [Diphenhydramine Hcl (Sleep)] Other (See Comments)    unknown  . Rosuvastatin Hives    Patient is allergic to generic rosuvastatin. She says she is able to take branded Crestor.   . Simvastatin Other (See Comments)    unknown    Current Outpatient Medications  Medication Sig Dispense Refill  . ACCU-CHEK AVIVA PLUS test strip USE AS INSTRUCTED 100 strip 0  . Accu-Chek FastClix Lancets MISC USE ONCE DAILY AS DIRECTED 102 each 3  . ACCU-CHEK GUIDE test strip USE AS INSTRUCTED 100 strip 1  . aspirin EC 81 MG tablet Take 81 mg by mouth in the morning. Swallow whole.    Marland Kitchen atorvastatin (LIPITOR) 10 MG tablet Take 1 tablet (10 mg total) by mouth daily. 90 tablet 1  . Blood Glucose Monitoring Suppl (ACCU-CHEK GUIDE ME) w/Device KIT 1 kit by Does not apply route daily. Use to check blood sugar daily. 1 kit 0  . carboxymethylcellul-glycerin (LUBRICANT DROPS/DUAL-ACTION) 0.5-0.9 % ophthalmic solution Place 1-2 drops into both eyes 3 (three) times daily as needed for dry eyes.    . carvedilol (COREG) 12.5 MG tablet TAKE 1 TABLET(12.5 MG) BY MOUTH TWICE DAILY WITH A MEAL (Patient taking differently: Take 12.5 mg by mouth in the morning and at bedtime.) 180 tablet 1  . gabapentin (NEURONTIN) 300 MG capsule Take 300 mg by mouth at bedtime.    Marland Kitchen glipiZIDE (GLUCOTROL) 10 MG tablet TAKE 1 TABLET(10 MG) BY MOUTH DAILY BEFORE BREAKFAST (Patient taking  differently: Take 10 mg by mouth daily.) 30 tablet 0  . glucose blood test strip Use as instructed 100 each 12  . hydrochlorothiazide (HYDRODIURIL) 25 MG tablet Take 1 tablet (25 mg total) by mouth daily. (Patient taking differently: Take 25 mg by mouth every evening.) 90 tablet 1  . metFORMIN (GLUCOPHAGE) 1000 MG tablet Take 1 tablet (1,000 mg total) by mouth 2 (two) times daily with a meal. 180 tablet 1  . Misc. Devices MISC Shower bench, Above the toilet seat.  Diagnosis- Type 2 Diabetes Mellitus 1 each 0  . Multiple Vitamin (MULTIVITAMIN WITH MINERALS) TABS tablet Take 1 tablet by mouth in the morning.    . nicotine (NICODERM CQ - DOSED IN MG/24 HOURS) 14 mg/24hr patch Place 1 patch (14 mg total) onto the skin daily. 28 patch 0  . nitroGLYCERIN (NITROSTAT) 0.4 MG SL tablet Place 0.4 mg under the tongue every 5 (five) minutes as needed for chest pain.    Marland Kitchen PRESCRIPTION MEDICATION Apply 1 application topically in the morning and at bedtime. Kamea Foot Cream    . oxyCODONE-acetaminophen (PERCOCET/ROXICET) 5-325 MG tablet Take 1 tablet by mouth every 4 (four) hours as needed for moderate pain or severe pain. (Patient not taking: Reported on 03/22/2021) 30 tablet 0   No current facility-administered medications for this visit.  ROS:  See HPI  Physical Exam:    Incision:  Right groin incision with healing ridge firmness, no openings in the skin, no erythema.  Medial leg incisions are healing well. Extremities:  Palpable DP, brisk doppler signals DP/PT/peroneal right LE Heart: RRR Lungs:  Non labored breathing   Assessment/Plan:  This is a 70 y.o. female who is s/p:Fem -above knee pop bypass for sever symptoms of claudication.  Increase activity as tolerates.  She is still working with PT.  Elevate her feet when at rest.  She will f/u in 9 months for right LE bypass duplex and ABI's.  Her left LE has normal arterial flow.  I gave her tramadol 50 mg 1 q 6 PRN # 20 today.     Roxy Horseman PA-C [facility] [phone]   Clinic MD:  Donzetta Matters

## 2021-03-23 ENCOUNTER — Other Ambulatory Visit: Payer: Self-pay | Admitting: Family Medicine

## 2021-03-23 DIAGNOSIS — I10 Essential (primary) hypertension: Secondary | ICD-10-CM

## 2021-03-25 DIAGNOSIS — Z7984 Long term (current) use of oral hypoglycemic drugs: Secondary | ICD-10-CM | POA: Diagnosis not present

## 2021-03-25 DIAGNOSIS — E1151 Type 2 diabetes mellitus with diabetic peripheral angiopathy without gangrene: Secondary | ICD-10-CM | POA: Diagnosis not present

## 2021-03-25 DIAGNOSIS — I2510 Atherosclerotic heart disease of native coronary artery without angina pectoris: Secondary | ICD-10-CM | POA: Diagnosis not present

## 2021-03-25 DIAGNOSIS — I10 Essential (primary) hypertension: Secondary | ICD-10-CM | POA: Diagnosis not present

## 2021-03-25 DIAGNOSIS — Z79891 Long term (current) use of opiate analgesic: Secondary | ICD-10-CM | POA: Diagnosis not present

## 2021-03-25 DIAGNOSIS — Z95820 Peripheral vascular angioplasty status with implants and grafts: Secondary | ICD-10-CM | POA: Diagnosis not present

## 2021-03-25 DIAGNOSIS — Z9181 History of falling: Secondary | ICD-10-CM | POA: Diagnosis not present

## 2021-03-25 DIAGNOSIS — Z7982 Long term (current) use of aspirin: Secondary | ICD-10-CM | POA: Diagnosis not present

## 2021-03-25 DIAGNOSIS — M1711 Unilateral primary osteoarthritis, right knee: Secondary | ICD-10-CM | POA: Diagnosis not present

## 2021-03-25 DIAGNOSIS — Z48812 Encounter for surgical aftercare following surgery on the circulatory system: Secondary | ICD-10-CM | POA: Diagnosis not present

## 2021-03-26 ENCOUNTER — Other Ambulatory Visit: Payer: Self-pay

## 2021-03-26 DIAGNOSIS — I739 Peripheral vascular disease, unspecified: Secondary | ICD-10-CM

## 2021-03-27 DIAGNOSIS — Z7982 Long term (current) use of aspirin: Secondary | ICD-10-CM | POA: Diagnosis not present

## 2021-03-27 DIAGNOSIS — Z79891 Long term (current) use of opiate analgesic: Secondary | ICD-10-CM | POA: Diagnosis not present

## 2021-03-27 DIAGNOSIS — I2510 Atherosclerotic heart disease of native coronary artery without angina pectoris: Secondary | ICD-10-CM | POA: Diagnosis not present

## 2021-03-27 DIAGNOSIS — Z95820 Peripheral vascular angioplasty status with implants and grafts: Secondary | ICD-10-CM | POA: Diagnosis not present

## 2021-03-27 DIAGNOSIS — E1151 Type 2 diabetes mellitus with diabetic peripheral angiopathy without gangrene: Secondary | ICD-10-CM | POA: Diagnosis not present

## 2021-03-27 DIAGNOSIS — Z9181 History of falling: Secondary | ICD-10-CM | POA: Diagnosis not present

## 2021-03-27 DIAGNOSIS — Z48812 Encounter for surgical aftercare following surgery on the circulatory system: Secondary | ICD-10-CM | POA: Diagnosis not present

## 2021-03-27 DIAGNOSIS — I10 Essential (primary) hypertension: Secondary | ICD-10-CM | POA: Diagnosis not present

## 2021-03-27 DIAGNOSIS — Z7984 Long term (current) use of oral hypoglycemic drugs: Secondary | ICD-10-CM | POA: Diagnosis not present

## 2021-03-27 DIAGNOSIS — M1711 Unilateral primary osteoarthritis, right knee: Secondary | ICD-10-CM | POA: Diagnosis not present

## 2021-04-03 DIAGNOSIS — M792 Neuralgia and neuritis, unspecified: Secondary | ICD-10-CM | POA: Diagnosis not present

## 2021-04-03 DIAGNOSIS — S9031XD Contusion of right foot, subsequent encounter: Secondary | ICD-10-CM | POA: Diagnosis not present

## 2021-04-03 DIAGNOSIS — D2371 Other benign neoplasm of skin of right lower limb, including hip: Secondary | ICD-10-CM | POA: Diagnosis not present

## 2021-04-03 DIAGNOSIS — M21961 Unspecified acquired deformity of right lower leg: Secondary | ICD-10-CM | POA: Diagnosis not present

## 2021-04-11 ENCOUNTER — Other Ambulatory Visit: Payer: Self-pay | Admitting: Internal Medicine

## 2021-04-11 ENCOUNTER — Telehealth: Payer: Self-pay | Admitting: Family Medicine

## 2021-04-11 DIAGNOSIS — Z7984 Long term (current) use of oral hypoglycemic drugs: Secondary | ICD-10-CM | POA: Diagnosis not present

## 2021-04-11 DIAGNOSIS — E1151 Type 2 diabetes mellitus with diabetic peripheral angiopathy without gangrene: Secondary | ICD-10-CM | POA: Diagnosis not present

## 2021-04-11 DIAGNOSIS — I10 Essential (primary) hypertension: Secondary | ICD-10-CM | POA: Diagnosis not present

## 2021-04-11 DIAGNOSIS — Z1231 Encounter for screening mammogram for malignant neoplasm of breast: Secondary | ICD-10-CM

## 2021-04-11 DIAGNOSIS — Z7982 Long term (current) use of aspirin: Secondary | ICD-10-CM | POA: Diagnosis not present

## 2021-04-11 DIAGNOSIS — M1711 Unilateral primary osteoarthritis, right knee: Secondary | ICD-10-CM | POA: Diagnosis not present

## 2021-04-11 DIAGNOSIS — Z79891 Long term (current) use of opiate analgesic: Secondary | ICD-10-CM | POA: Diagnosis not present

## 2021-04-11 DIAGNOSIS — Z95820 Peripheral vascular angioplasty status with implants and grafts: Secondary | ICD-10-CM | POA: Diagnosis not present

## 2021-04-11 DIAGNOSIS — Z9181 History of falling: Secondary | ICD-10-CM | POA: Diagnosis not present

## 2021-04-11 DIAGNOSIS — I2510 Atherosclerotic heart disease of native coronary artery without angina pectoris: Secondary | ICD-10-CM | POA: Diagnosis not present

## 2021-04-11 DIAGNOSIS — Z48812 Encounter for surgical aftercare following surgery on the circulatory system: Secondary | ICD-10-CM | POA: Diagnosis not present

## 2021-04-11 NOTE — Telephone Encounter (Signed)
Copied from [place] [id number]. Topic: General - Other >> Apr 11, 2021 10:00 AM [place] wrote: Reason for CRM: Judeth Porch with [facility] is calling and will refax the plan of care from 03-12-2021 and order number [phone]

## 2021-04-11 NOTE — Telephone Encounter (Signed)
Paperwork has been received and will be faxed once complete.

## 2021-04-30 ENCOUNTER — Telehealth: Payer: Self-pay | Admitting: Family Medicine

## 2021-04-30 NOTE — Telephone Encounter (Signed)
Requested medication (s) are due for refill today - no  Requested medication (s) are on the active medication list -no  Future visit scheduled -yes  Last refill: 10/21/20  Notes to clinic: Request RF- non delegated Rx- no longer current on medication list  Requested Prescriptions  Pending Prescriptions Disp Refills   acetaminophen-codeine (TYLENOL #3) 300-30 MG tablet [Pharmacy Med Name: ACETAMINOPHEN/COD #3 (300/30MG ) TAB] 60 tablet     Sig: TAKE 1 TABLET BY MOUTH EVERY 12 HOURS AS NEEDED FOR MODERATE PAIN      Not Delegated - Analgesics:  Opioid Agonist Combinations Failed - 04/30/2021 10:33 AM      Failed - This refill cannot be delegated      Failed - Urine Drug Screen completed in last 360 days      Passed - Valid encounter within last 6 months    Recent Outpatient Visits           3 months ago Type 2 diabetes mellitus with diabetic polyneuropathy, without long-term current use of insulin ([place])   [place], [place], MD   1 year ago Type 2 diabetes mellitus with diabetic polyneuropathy, without long-term current use of insulin ([place])   [place], Charlane Ferretti, MD   1 year ago Tobacco use   [place], [place], MD   1 year ago Type 2 diabetes mellitus without complication, without long-term current use of insulin ([place])   [place], [place] W, NP   2 years ago Type 2 diabetes mellitus without complication, without long-term current use of insulin ([place])   [place], Vernia Buff, NP       Future Appointments             In 4 weeks Charlott Rakes, MD [place]                 Requested Prescriptions  Pending Prescriptions Disp Refills   acetaminophen-codeine (TYLENOL #3) 300-30 MG tablet [Pharmacy Med Name: ACETAMINOPHEN/COD #3 (300/30MG ) TAB] 60 tablet      Sig: TAKE 1 TABLET BY MOUTH EVERY 12 HOURS AS NEEDED FOR MODERATE PAIN      Not Delegated - Analgesics:  Opioid Agonist Combinations Failed - 04/30/2021 10:33 AM      Failed - This refill cannot be delegated      Failed - Urine Drug Screen completed in last 360 days      Passed - Valid encounter within last 6 months    Recent Outpatient Visits           3 months ago Type 2 diabetes mellitus with diabetic polyneuropathy, without long-term current use of insulin ([place])   [place], [place], MD   1 year ago Type 2 diabetes mellitus with diabetic polyneuropathy, without long-term current use of insulin ([place])   [place], Charlane Ferretti, MD   1 year ago Tobacco use   [place], [place], MD   1 year ago Type 2 diabetes mellitus without complication, without long-term current use of insulin [facility])   [place], [place] W, NP   2 years ago Type 2 diabetes mellitus without complication, without long-term current use of insulin [facility])   [place], Vernia Buff, NP  Future Appointments             In 4 weeks Charlott Rakes, MD [place]

## 2021-05-01 NOTE — Telephone Encounter (Signed)
Noted  

## 2021-05-01 NOTE — Telephone Encounter (Signed)
Mendel Ryder from [place] called and stated that the PA for acetaminophen-codeine was approved but only for one 30 day supply due to pt not meeting all pain management criteria /

## 2021-05-27 ENCOUNTER — Other Ambulatory Visit: Payer: Self-pay | Admitting: Family Medicine

## 2021-05-27 DIAGNOSIS — E1159 Type 2 diabetes mellitus with other circulatory complications: Secondary | ICD-10-CM

## 2021-05-27 NOTE — Telephone Encounter (Signed)
Requested medication (s) are due for refill today:   Yes  Requested medication (s) are on the active medication list:   Yes  Future visit scheduled:   Yes   Last ordered: 01/27/2021 #30, 0 refills  Returned because 30 day courtesy refill was given in May 2022.   Has an appt 05/29/19   Requested Prescriptions  Pending Prescriptions Disp Refills   glipiZIDE (GLUCOTROL) 10 MG tablet [Pharmacy Med Name: GLIPIZIDE '10MG'$  TABLETS] 30 tablet 0    Sig: TAKE 1 TABLET(10 MG) BY MOUTH DAILY BEFORE BREAKFAST      Endocrinology:  Diabetes - Sulfonylureas Passed - 05/27/2021  3:42 AM      Passed - HBA1C is between 0 and 7.9 and within 180 days    Hemoglobin A1C  Date Value Ref Range Status  01/24/2021 6.2 (A) 4.0 - 5.6 % Final   HbA1c, POC (controlled diabetic range)  Date Value Ref Range Status  01/03/2020 6.8 0.0 - 7.0 % Final          Passed - Valid encounter within last 6 months    Recent Outpatient Visits           4 months ago Type 2 diabetes mellitus with diabetic polyneuropathy, without long-term current use of insulin ([place])   [place], [place], MD   1 year ago Type 2 diabetes mellitus with diabetic polyneuropathy, without long-term current use of insulin ([place])   [place], Charlane Ferretti, MD   1 year ago Tobacco use   [place], [place], MD   1 year ago Type 2 diabetes mellitus without complication, without long-term current use of insulin [facility])   [place], [place] W, NP   2 years ago Type 2 diabetes mellitus without complication, without long-term current use of insulin [facility])   [place], Vernia Buff, NP       Future Appointments             Tomorrow Charlott Rakes, MD [place]

## 2021-05-27 NOTE — Telephone Encounter (Signed)
Requested medication (s) are due for refill today:   Yes  Requested medication (s) are on the active medication list:   Yes  Future visit scheduled:   Yes tomorrow 05/28/2021   Last ordered: 01/27/2021 #30, 0 refills  Returned because 30 day courtesy refill was given in April 2022.   Provider to review for refill prior to appt tomorrow..   This request may have come through twice.   Having internet connection issues this morning.   Requested Prescriptions  Pending Prescriptions Disp Refills   glipiZIDE (GLUCOTROL) 10 MG tablet [Pharmacy Med Name: GLIPIZIDE '10MG'$  TABLETS] 30 tablet 0    Sig: TAKE 1 TABLET(10 MG) BY MOUTH DAILY BEFORE BREAKFAST      Endocrinology:  Diabetes - Sulfonylureas Passed - 05/27/2021  7:34 AM      Passed - HBA1C is between 0 and 7.9 and within 180 days    Hemoglobin A1C  Date Value Ref Range Status  01/24/2021 6.2 (A) 4.0 - 5.6 % Final   HbA1c, POC (controlled diabetic range)  Date Value Ref Range Status  01/03/2020 6.8 0.0 - 7.0 % Final          Passed - Valid encounter within last 6 months    Recent Outpatient Visits           4 months ago Type 2 diabetes mellitus with diabetic polyneuropathy, without long-term current use of insulin ([place])   [place], [place], MD   1 year ago Type 2 diabetes mellitus with diabetic polyneuropathy, without long-term current use of insulin ([place])   [place], Charlane Ferretti, MD   1 year ago Tobacco use   [place], [place], MD   1 year ago Type 2 diabetes mellitus without complication, without long-term current use of insulin [facility])   [place], [place] W, NP   2 years ago Type 2 diabetes mellitus without complication, without long-term current use of insulin [facility])   [place], Vernia Buff, NP       Future Appointments              Tomorrow Charlott Rakes, MD [place]

## 2021-05-28 ENCOUNTER — Other Ambulatory Visit: Payer: Self-pay

## 2021-05-28 ENCOUNTER — Ambulatory Visit: Payer: BC Managed Care – PPO | Attending: Family Medicine | Admitting: Family Medicine

## 2021-06-01 ENCOUNTER — Other Ambulatory Visit: Payer: Self-pay | Admitting: Internal Medicine

## 2021-06-01 NOTE — Telephone Encounter (Signed)
Requested medication (s) are due for refill today: yes  Requested medication (s) are on the active medication list: yes  Last refill:  04/30/21 #20  Future visit scheduled: no  Notes to clinic:  med not delegated to NT to RF   Requested Prescriptions  Pending Prescriptions Disp Refills   acetaminophen-codeine (TYLENOL #3) 300-30 MG tablet [Pharmacy Med Name: ACETAMINOPHEN/COD #3 (300/'30MG'$ ) TAB] 20 tablet     Sig: TAKE 1 TABLET BY MOUTH EVERY 12 HOURS AS NEEDED FOR MODERATE PAIN     Not Delegated - Analgesics:  Opioid Agonist Combinations Failed - 06/01/2021  9:58 AM      Failed - This refill cannot be delegated      Failed - Urine Drug Screen completed in last 360 days      Passed - Valid encounter within last 6 months    Recent Outpatient Visits           4 months ago Type 2 diabetes mellitus with diabetic polyneuropathy, without long-term current use of insulin ([place])   [place], [place], MD   1 year ago Type 2 diabetes mellitus with diabetic polyneuropathy, without long-term current use of insulin ([place])   [place], Charlane Ferretti, MD   1 year ago Tobacco use   [place], [place], MD   1 year ago Type 2 diabetes mellitus without complication, without long-term current use of insulin [facility])   [place], [place] W, NP   2 years ago Type 2 diabetes mellitus without complication, without long-term current use of insulin [facility])   [place], Vernia Buff, NP

## 2021-06-04 ENCOUNTER — Ambulatory Visit
Admission: RE | Admit: 2021-06-04 | Discharge: 2021-06-04 | Disposition: A | Discharge status: Home / Self Care | Payer: Medicare Other | Source: Ambulatory Visit | Attending: Internal Medicine | Admitting: Internal Medicine

## 2021-06-04 ENCOUNTER — Telehealth: Payer: Self-pay | Admitting: Family Medicine

## 2021-06-04 ENCOUNTER — Other Ambulatory Visit: Payer: Self-pay

## 2021-06-04 DIAGNOSIS — Z1231 Encounter for screening mammogram for malignant neoplasm of breast: Secondary | ICD-10-CM | POA: Diagnosis not present

## 2021-06-04 NOTE — Telephone Encounter (Signed)
  acetaminophen-codeine (TYLENOL #3) 300-30 MG tablet [id number]   Pharmacy  [place], [place]

## 2021-06-05 NOTE — Telephone Encounter (Signed)
Pt has an appointment for 8/29 and her requested medication has been refilled.

## 2021-06-05 NOTE — Telephone Encounter (Signed)
Copied from [place] [id number]. Topic: Appointment Scheduling - Scheduling Inquiry for Clinic >> May 31, 2021 12:00 PM Tammy Graham, [place] C wrote: Reason for CRM: pt called in for assistance with rescheduling her apt. Offered next available. Pt says that apt is to far out. Pt says that she was told by her cardiologist to follow up with PCP sooner than later to have a CPE. Pt says that she had a surgery in May and hasn't followed up. Pt says that she has also seen provider Raul Del, she is okay with seeing both providers.   - please assist pt further.    CB: [id number] --

## 2021-06-17 ENCOUNTER — Other Ambulatory Visit: Payer: Self-pay

## 2021-06-17 ENCOUNTER — Ambulatory Visit: Payer: BC Managed Care – PPO | Attending: Family Medicine | Admitting: Family Medicine

## 2021-06-17 ENCOUNTER — Encounter: Payer: Self-pay | Admitting: Family Medicine

## 2021-06-17 VITALS — BP 154/73 | HR 67 | Ht 66.0 in | Wt 163.2 lb

## 2021-06-17 DIAGNOSIS — I10 Essential (primary) hypertension: Secondary | ICD-10-CM | POA: Diagnosis not present

## 2021-06-17 DIAGNOSIS — M792 Neuralgia and neuritis, unspecified: Secondary | ICD-10-CM

## 2021-06-17 DIAGNOSIS — E1159 Type 2 diabetes mellitus with other circulatory complications: Secondary | ICD-10-CM

## 2021-06-17 DIAGNOSIS — E1142 Type 2 diabetes mellitus with diabetic polyneuropathy: Secondary | ICD-10-CM

## 2021-06-17 DIAGNOSIS — E1169 Type 2 diabetes mellitus with other specified complication: Secondary | ICD-10-CM

## 2021-06-17 DIAGNOSIS — I739 Peripheral vascular disease, unspecified: Secondary | ICD-10-CM

## 2021-06-17 DIAGNOSIS — E785 Hyperlipidemia, unspecified: Secondary | ICD-10-CM

## 2021-06-17 DIAGNOSIS — F17210 Nicotine dependence, cigarettes, uncomplicated: Secondary | ICD-10-CM

## 2021-06-17 DIAGNOSIS — Z720 Tobacco use: Secondary | ICD-10-CM

## 2021-06-17 DIAGNOSIS — M1712 Unilateral primary osteoarthritis, left knee: Secondary | ICD-10-CM

## 2021-06-17 LAB — GLUCOSE, POCT (MANUAL RESULT ENTRY): POC Glucose: 133 mg/dl — AB (ref 70–99)

## 2021-06-17 LAB — POCT GLYCOSYLATED HEMOGLOBIN (HGB A1C): HbA1c, POC (controlled diabetic range): 7.2 % — AB (ref 0.0–7.0)

## 2021-06-17 MED ORDER — ACETAMINOPHEN-CODEINE #3 300-30 MG PO TABS
1.0000 | ORAL_TABLET | Freq: Two times a day (BID) | ORAL | 0 refills | Status: DC | PRN
  Filled 2021-06-17 – 2021-06-21 (×3): qty 60, 30d supply, fill #0

## 2021-06-17 MED ORDER — ACCU-CHEK FASTCLIX LANCETS MISC
11 refills | Status: DC
  Filled 2021-06-17: qty 102, 25d supply, fill #0

## 2021-06-17 MED ORDER — DULOXETINE HCL 60 MG PO CPEP
60.0000 mg | ORAL_CAPSULE | Freq: Every day | ORAL | 3 refills | Status: DC
  Filled 2021-06-17 – 2021-06-21 (×3): qty 30, 30d supply, fill #0

## 2021-06-17 MED ORDER — CARVEDILOL 12.5 MG PO TABS
ORAL_TABLET | ORAL | 1 refills | Status: DC
Start: 2021-06-17 — End: 2021-07-23
  Filled 2021-06-17: qty 180, 90d supply, fill #0
  Filled 2021-07-23: qty 30, 30d supply, fill #0

## 2021-06-17 MED ORDER — ATORVASTATIN CALCIUM 10 MG PO TABS
10.0000 mg | ORAL_TABLET | Freq: Every day | ORAL | 1 refills | Status: DC
Start: 2021-06-17 — End: 2021-09-27
  Filled 2021-06-17: qty 90, 90d supply, fill #0

## 2021-06-17 MED ORDER — HYDROCHLOROTHIAZIDE 25 MG PO TABS
ORAL_TABLET | ORAL | 1 refills | Status: DC
Start: 2021-06-17 — End: 2021-09-26
  Filled 2021-06-17: qty 90, 90d supply, fill #0
  Filled 2021-06-21: qty 30, 30d supply, fill #0
  Filled 2021-09-18: qty 30, 30d supply, fill #1

## 2021-06-17 MED ORDER — NICOTINE 7 MG/24HR TD PT24
7.0000 mg | MEDICATED_PATCH | Freq: Every day | TRANSDERMAL | 1 refills | Status: DC
  Filled 2021-06-17 – 2021-09-18 (×2): qty 28, 28d supply, fill #0

## 2021-06-17 MED ORDER — GLIPIZIDE 10 MG PO TABS
ORAL_TABLET | ORAL | 1 refills | Status: DC
Start: 2021-06-17 — End: 2021-08-27
  Filled 2021-06-17: qty 90, 90d supply, fill #0

## 2021-06-17 MED ORDER — METFORMIN HCL 1000 MG PO TABS
1000.0000 mg | ORAL_TABLET | Freq: Two times a day (BID) | ORAL | 1 refills | Status: DC
Start: 2021-06-17 — End: 2021-08-01
  Filled 2021-06-17: qty 180, 90d supply, fill #0

## 2021-06-17 NOTE — Progress Notes (Signed)
Needs refill on nicotine patches.  Dermatology referral for boil on back.  90 day prescriptions.

## 2021-06-17 NOTE — Patient Instructions (Signed)
Chronic Knee Pain, Adult °Chronic knee pain is pain in one or both knees that lasts longer than 3 months. Symptoms of chronic knee pain may include swelling, stiffness, and discomfort. Age-related wear and tear (osteoarthritis) of the knee joint is the most common cause of chronic knee pain. Other possible causes include: °A long-term immune-related disease that causes inflammation of the knee (rheumatoid arthritis). This usually affects both knees. °Inflammatory arthritis, such as gout or pseudogout. °An injury to the knee that causes arthritis. °An injury to the knee that damages the ligaments. Ligaments are strong tissues that connect bones to each other. °Runner's knee or pain behind the kneecap. °Treatment for chronic knee pain depends on the cause. The main treatments for chronic knee pain are physical therapy and weight loss. This condition may also be treated with medicines, injections, a knee sleeve or brace, and by using crutches. Rest, ice, pressure (compression), and elevation, also known as RICE therapy, may also be recommended. °Follow these instructions at home: °If you have a knee sleeve or brace: ° °Wear the knee sleeve or brace as told by your health care provider. Remove it only as told by your health care provider. °Loosen it if your toes tingle, become numb, or turn cold and blue. °Keep it clean. °If the sleeve or brace is not waterproof: °Do not let it get wet. °Remove it if allowed by your health care provider, or cover it with a watertight covering when you take a bath or a shower. °Managing pain, stiffness, and swelling °  °If directed, apply heat to the affected area as often as told by your health care provider. Use the heat source that your health care provider recommends, such as a moist heat pack or a heating pad. °If you have a removable knee sleeve or brace, remove it as told by your health care provider. °Place a towel between your skin and the heat source. °Leave the heat on for  20-30 minutes. °Remove the heat if your skin turns bright red. This is especially important if you are unable to feel pain, heat, or cold. You may have a greater risk of getting burned. °If directed, put ice on the affected area. To do this: °If you have a removable knee sleeve or brace, remove it as told by your health care provider. °Put ice in a plastic bag. °Place a towel between your skin and the bag. °Leave the ice on for 20 minutes, 2-3 times a day. °Remove the ice if your skin turns bright red. This is very important. If you cannot feel pain, heat, or cold, you have a greater risk of damage to the area. °Move your toes often to reduce stiffness and swelling. °Raise (elevate) the injured area above the level of your heart while you are sitting or lying down. °Activity °Avoid high-impact activities or exercises, such as running, jumping rope, or doing jumping jacks. °Follow the exercise plan that your health care provider designed for you. Your health care provider may suggest that you: °Avoid activities that make knee pain worse. This may require you to change your exercise routines, sport participation, or job duties. °Wear shoes with cushioned soles. °Avoid sports that require running and sudden changes in direction. °Do physical therapy. Physical therapy is planned to match your needs and abilities. It may include exercises for strength, flexibility, stability, and endurance. °Do exercises that increase balance and strength, such as tai chi and yoga. °Do not use the injured limb to support your body weight   until your health care provider says that you can. Use crutches as told by your health care provider. °Return to your normal activities as told by your health care provider. Ask your health care provider what activities are safe for you. °General instructions °Take over-the-counter and prescription medicines only as told by your health care provider. °Lose weight if you are overweight. Losing even a  little weight can reduce knee pain. Ask your health care provider what your ideal weight is, and how to safely lose extra weight. A dietitian may be able to help you plan your meals. °Do not use any products that contain nicotine or tobacco, such as cigarettes, e-cigarettes, and chewing tobacco. These can delay healing. If you need help quitting, ask your health care provider. °Keep all follow-up visits. This is important. °Contact a health care provider if: °You have knee pain that is not getting better or gets worse. °You are unable to do your physical therapy exercises due to knee pain. °Get help right away if: °Your knee swells and the swelling becomes worse. °You cannot move your knee. °You have severe knee pain. °Summary °Knee pain that lasts more than 3 months is considered chronic knee pain. °The main treatments for chronic knee pain are physical therapy and weight loss. You may also need to take medicines, wear a knee sleeve or brace, use crutches, and apply ice or heat. °Losing even a little weight can reduce knee pain. Ask your health care provider what your ideal weight is, and how to safely lose extra weight. A dietitian may be able to help you plan your meals. °Follow the exercise plan that your health care provider designed for you. °This information is not intended to replace advice given to you by your health care provider. Make sure you discuss any questions you have with your health care provider. °Document Revised: 03/21/2020 Document Reviewed: 03/21/2020 °Elsevier Patient Education © 2022 [employer] ° °

## 2021-06-17 NOTE — Progress Notes (Signed)
Subjective:  Patient ID: Tammy Graham, female    DOB: 20-Sep-1951  Age: 71 y.o. MRN: [id number]  CC: Diabetes   HPI Tammy Graham is a 70 y.o. year old female with a history of of coronary artery disease (status post DES), type 2 diabetes mellitus (A1c 7.2), hypertension, PAD (s/p right common femoral to above-knee popliteal bypass in 02/2021), L knee OA, tobacco abuse who presents today for chronic disease management.  Interval History: She has shooting pain in the medial aspect of her R thigh. She does not think the gabapentin is effective. Seen by her vascular surgeon who informed her that pain will persist for up to a year.  Last visit with vascular surgery was on 03/22/2021. Her BP is elevated ; she drank coffee before this visit she states.  BP at previous visit have been controlled with the last at 137/78.  She endorses compliance with her antihypertensive.  Her A1c is 7.2 up from 6.2 previously and she endorses compliance with her medications but has been ingesting a lot of sweets. States her Ophthalmology exam is at the end of the year. She continues to smoke intermittently and is working on quitting.  Requests prescription for patches. She uses Tylenol 3 intermittently for left knee osteoarthritis as she is putting off surgery in the interim. Past Medical History:  Diagnosis Date   Anxiety    Arthritis    shoulder and knees   CAD (coronary artery disease)    Complication of anesthesia    states she was still awake one time when surgery started   DM (diabetes mellitus) ([place])    HTN (hypertension)    Hyperlipemia    Myocardial infarction [facility])    Peripheral vascular disease ([place])    Pneumonia    Sleep apnea    no cpap   Stroke [facility])    2012    Past Surgical History:  Procedure Laterality Date   ABDOMINAL AORTOGRAM W/LOWER EXTREMITY Bilateral 02/11/2021   Procedure: ABDOMINAL AORTOGRAM W/LOWER EXTREMITY;  Surgeon: Waynetta Sandy, MD;  Location: [place]  CV LAB;  Service: Cardiovascular;  Laterality: Bilateral;   CARDIAC CATHETERIZATION     COLONOSCOPY     COLONOSCOPY  09/25/2011   Procedure: COLONOSCOPY;  Surgeon: Winfield Cunas., MD;  Location: [facility] ENDOSCOPY;  Service: Endoscopy;  Laterality: N/A;   CORONARY STENT PLACEMENT     Ostial LAD stent in 2012   FEMORAL-POPLITEAL BYPASS GRAFT Right 02/27/2021   Procedure: RIGHT COMMON FEMORAL- ABOVE KNEE POPLITEAL ARTERY BYPASS GRAFT;  Surgeon: Waynetta Sandy, MD;  Location: [place];  Service: Vascular;  Laterality: Right;   Left knee surgery      Family History  Problem Relation Age of Onset   Heart attack Mother    Anesthesia problems Neg Hx    Hypotension Neg Hx    Malignant hyperthermia Neg Hx    Pseudochol deficiency Neg Hx     Allergies  Allergen Reactions   Benadryl [Diphenhydramine Hcl (Sleep)] Other (See Comments)    unknown   Rosuvastatin Hives    Patient is allergic to generic rosuvastatin. She says she is able to take branded Crestor.    Simvastatin Other (See Comments)    unknown    Outpatient Medications Prior to Visit  Medication Sig Dispense Refill   ACCU-CHEK AVIVA PLUS test strip USE AS INSTRUCTED 100 strip 0   Accu-Chek FastClix Lancets MISC USE ONCE DAILY AS DIRECTED 102 each 3   ACCU-CHEK GUIDE test  strip USE AS INSTRUCTED 100 strip 1   acetaminophen-codeine (TYLENOL #3) 300-30 MG tablet TAKE 1 TABLET BY MOUTH EVERY 12 HOURS AS NEEDED FOR MODERATE PAIN 20 tablet 1   aspirin EC 81 MG tablet Take 81 mg by mouth in the morning. Swallow whole.     atorvastatin (LIPITOR) 10 MG tablet Take 1 tablet (10 mg total) by mouth daily. 90 tablet 1   Blood Glucose Monitoring Suppl (ACCU-CHEK GUIDE ME) w/Device KIT 1 kit by Does not apply route daily. Use to check blood sugar daily. 1 kit 0   carboxymethylcellul-glycerin (LUBRICANT DROPS/DUAL-ACTION) 0.5-0.9 % ophthalmic solution Place 1-2 drops into both eyes 3 (three) times daily as needed for dry eyes.      carvedilol (COREG) 12.5 MG tablet TAKE 1 TABLET(12.5 MG) BY MOUTH TWICE DAILY WITH A MEAL (Patient taking differently: Take 12.5 mg by mouth in the morning and at bedtime.) 180 tablet 1   gabapentin (NEURONTIN) 300 MG capsule TAKE 1 CAPSULE(300 MG) BY MOUTH AT BEDTIME 30 capsule 3   glipiZIDE (GLUCOTROL) 10 MG tablet TAKE 1 TABLET(10 MG) BY MOUTH DAILY BEFORE BREAKFAST 90 tablet 0   glucose blood test strip Use as instructed 100 each 12   hydrochlorothiazide (HYDRODIURIL) 25 MG tablet TAKE 1 TABLET(25 MG) BY MOUTH DAILY 90 tablet 0   metFORMIN (GLUCOPHAGE) 1000 MG tablet Take 1 tablet (1,000 mg total) by mouth 2 (two) times daily with a meal. 180 tablet 1   Misc. Devices MISC Shower bench, Above the toilet seat.  Diagnosis- Type 2 Diabetes Mellitus 1 each 0   Multiple Vitamin (MULTIVITAMIN WITH MINERALS) TABS tablet Take 1 tablet by mouth in the morning.     nitroGLYCERIN (NITROSTAT) 0.4 MG SL tablet Place 0.4 mg under the tongue every 5 (five) minutes as needed for chest pain.     PRESCRIPTION MEDICATION Apply 1 application topically in the morning and at bedtime. K[place]a Foot Cream     traMADol (ULTRAM) 50 MG tablet Take 1 tablet (50 mg total) by mouth every 6 (six) hours as needed. 20 tablet 0   oxyCODONE-acetaminophen (PERCOCET/ROXICET) 5-325 MG tablet Take 1 tablet by mouth every 4 (four) hours as needed for moderate pain or severe pain. (Patient not taking: No sig reported) 30 tablet 0   No facility-administered medications prior to visit.     ROS Review of Systems  Constitutional:  Negative for activity change, appetite change and fatigue.  HENT:  Negative for congestion, sinus pressure and sore throat.   Eyes:  Negative for visual disturbance.  Respiratory:  Negative for cough, chest tightness, shortness of breath and wheezing.   Cardiovascular:  Negative for chest pain and palpitations.  Gastrointestinal:  Negative for abdominal distention, abdominal pain and constipation.  Endocrine:  Negative for polydipsia.  Genitourinary:  Negative for dysuria and frequency.  Musculoskeletal:        See HPI  Skin:  Negative for rash.  Neurological:  Negative for tremors, light-headedness and numbness.  Hematological:  Does not bruise/bleed easily.  Psychiatric/Behavioral:  Negative for agitation and behavioral problems.    Objective:  BP (!) 154/73   Pulse 67   Ht 5' 6"  (1.676 m)   Wt 163 lb 3.2 oz (74 kg)   SpO2 97%   BMI 26.34 kg/m   BP/Weight 06/17/2021 03/22/2021 01/26/1447  Systolic BP 185 631 497  Diastolic BP 73 78 66  Wt. (Lbs) 163.2 160.9 -  BMI 26.34 25.58 -      Physical Exam Constitutional:  Appearance: She is well-developed.  Cardiovascular:     Rate and Rhythm: Normal rate.     Heart sounds: Normal heart sounds. No murmur heard. Pulmonary:     Effort: Pulmonary effort is normal.     Breath sounds: Normal breath sounds. No wheezing or rales.  Chest:     Chest wall: No tenderness.  Abdominal:     General: Bowel sounds are normal. There is no distension.     Palpations: Abdomen is soft. There is no mass.     Tenderness: There is no abdominal tenderness.  Musculoskeletal:     Right lower leg: No edema.     Left lower leg: No edema.     Comments: Crepitus on range of motion of left knee with associated tenderness  Neurological:     Mental Status: She is alert and oriented to person, place, and time.  Psychiatric:        Mood and Affect: Mood normal.    CMP Latest Ref Rng & Units 02/28/2021 02/27/2021 02/27/2021  Glucose 70 - 99 mg/dL 264(H) - 150(H)  BUN 8 - 23 mg/dL 10 - 9  Creatinine 0.44 - 1.00 mg/dL 1.04(H) 0.95 0.91  Sodium 135 - 145 mmol/L 136 - 139  Potassium 3.5 - 5.1 mmol/L 4.3 - 4.1  Chloride 98 - 111 mmol/L 102 - 104  CO2 22 - 32 mmol/L 26 - 29  Calcium 8.9 - 10.3 mg/dL 9.1 - 10.1  Total Protein 6.5 - 8.1 g/dL - - 7.0  Total Bilirubin 0.3 - 1.2 mg/dL - - 0.9  Alkaline Phos 38 - 126 U/L - - 75  AST 15 - 41 U/L - - 21  ALT 0 - 44  U/L - - 20    Lipid Panel     Component Value Date/Time   CHOL 109 02/28/2021 0155   CHOL 184 07/01/2019 1550   TRIG 59 02/28/2021 0155   HDL 49 02/28/2021 0155   HDL 47 07/01/2019 1550   CHOLHDL 2.2 02/28/2021 0155   VLDL 12 02/28/2021 0155   LDLCALC 48 02/28/2021 0155   LDLCALC 102 (H) 07/01/2019 1550    CBC    Component Value Date/Time   WBC 13.5 (H) 02/28/2021 0155   RBC 3.61 (L) 02/28/2021 0155   HGB 11.8 (L) 02/28/2021 0155   HGB 15.3 07/01/2019 1550   HCT 34.4 (L) 02/28/2021 0155   HCT 46.5 07/01/2019 1550   PLT 193 02/28/2021 0155   PLT 257 07/01/2019 1550   MCV 95.3 02/28/2021 0155   MCV 98 (H) 07/01/2019 1550   MCH 32.7 02/28/2021 0155   MCHC 34.3 02/28/2021 0155   RDW 12.7 02/28/2021 0155   RDW 12.6 07/01/2019 1550   LYMPHSABS 2.9 09/11/2016 2316   MONOABS 0.4 09/11/2016 2316   EOSABS 0.2 09/11/2016 2316   BASOSABS 0.0 09/11/2016 2316    Lab Results  Component Value Date   HGBA1C 7.2 (A) 06/17/2021      Assessment & Plan:  1. Type 2 diabetes mellitus with diabetic polyneuropathy, without long-term current use of insulin (HCC) Controlled with A1c of 7.2 but this is up from 6.2 previously Goal is less than 7.0 She will work on cutting out sweets and reducing carb intake Counseled on Diabetic diet, my plate method, 825 minutes of moderate intensity exercise/week Blood sugar logs with fasting goals of 80-120 mg/dl, random of less than 180 and in the event of sugars less than 60 mg/dl or greater than 400 mg/dl encouraged to notify  the clinic. Advised on the need for annual eye exams, annual foot exams, Pneumonia vaccine. - POCT glucose (manual entry) - POCT glycosylated hemoglobin (Hb A1C) - DULoxetine (CYMBALTA) 60 MG capsule; Take 1 capsule (60 mg total) by mouth daily.  Dispense: 30 capsule; Refill: 3 - atorvastatin (LIPITOR) 10 MG tablet; Take 1 tablet (10 mg total) by mouth daily.  Dispense: 90 tablet; Refill: 1 - Accu-Chek FastClix Lancets MISC;  USE AS INSTRUCTED  Dispense: 102 each; Refill: 11  2. Essential hypertension Uncontrolled Previous blood pressure was controlled She attributes current elevation to recent coffee intake No regimen change today Counseled on blood pressure goal of less than 130/80, low-sodium, DASH diet, medication compliance, 150 minutes of moderate intensity exercise per week. Discussed medication compliance, adverse effects. - carvedilol (COREG) 12.5 MG tablet; TAKE 1 TABLET(12.5 MG) BY MOUTH TWICE DAILY WITH A MEAL  Dispense: 180 tablet; Refill: 1 - hydrochlorothiazide (HYDRODIURIL) 25 MG tablet; TAKE 1 TABLET(25 MG) BY MOUTH DAILY  Dispense: 90 tablet; Refill: 1  3. Type 2 diabetes mellitus with other circulatory complication, without long-term current use of insulin (HCC) See #1 above - glipiZIDE (GLUCOTROL) 10 MG tablet; TAKE 1 TABLET(10 MG) BY MOUTH DAILY BEFORE BREAKFAST  Dispense: 90 tablet; Refill: 1 - metFORMIN (GLUCOPHAGE) 1000 MG tablet; Take 1 tablet (1,000 mg total) by mouth 2 (two) times daily with a meal.  Dispense: 180 tablet; Refill: 1  4. Neuropathic pain Underlying diabetic neuropathy coupled with neuropathic pain postsurgery Gabapentin has been ineffective Trial of Cymbalta - DULoxetine (CYMBALTA) 60 MG capsule; Take 1 capsule (60 mg total) by mouth daily.  Dispense: 30 capsule; Refill: 3  5. Tobacco use Spent 3 minutes counseling on smoking cessation and she is willing to work on quitting Prescribe nicotine patches - nicotine (NICODERM CQ) 7 mg/24hr patch; Place 1 patch (7 mg total) onto the skin daily.  Dispense: 28 patch; Refill: 1  6. Peripheral arterial disease (HCC) Status post bypass Risk factor modification Continue high-dose statin  7. Osteoarthritis of left knee, unspecified osteoarthritis type Uncontrolled Will need knee surgery down the road - acetaminophen-codeine (TYLENOL #3) 300-30 MG tablet; Take 1 tablet by mouth every 12 (twelve) hours as needed for moderate  pain.  Dispense: 60 tablet; Refill: 0 - atorvastatin (LIPITOR) 10 MG tablet; Take 1 tablet (10 mg total) by mouth daily.  Dispense: 90 tablet; Refill: 1  8. Hyperlipidemia due to type 2 diabetes mellitus ([place]) Controlled Continue statin  Low-cholesterol diet    No orders of the defined types were placed in this encounter.  Return in about 3 months (around 09/17/2021) for medical conditions.       Charlott Rakes, MD, FAAFP. [facility] and [place], [place]   06/17/2021, 2:24 PM

## 2021-06-18 ENCOUNTER — Other Ambulatory Visit: Payer: Self-pay

## 2021-06-21 ENCOUNTER — Other Ambulatory Visit: Payer: Self-pay

## 2021-06-25 ENCOUNTER — Other Ambulatory Visit: Payer: Self-pay | Admitting: Family Medicine

## 2021-06-25 DIAGNOSIS — I10 Essential (primary) hypertension: Secondary | ICD-10-CM

## 2021-06-25 DIAGNOSIS — E1142 Type 2 diabetes mellitus with diabetic polyneuropathy: Secondary | ICD-10-CM

## 2021-06-25 DIAGNOSIS — E1159 Type 2 diabetes mellitus with other circulatory complications: Secondary | ICD-10-CM

## 2021-06-25 DIAGNOSIS — M1712 Unilateral primary osteoarthritis, left knee: Secondary | ICD-10-CM

## 2021-06-26 ENCOUNTER — Other Ambulatory Visit: Payer: Self-pay

## 2021-07-05 DIAGNOSIS — L851 Acquired keratosis [keratoderma] palmaris et plantaris: Secondary | ICD-10-CM | POA: Diagnosis not present

## 2021-07-05 DIAGNOSIS — I70203 Unspecified atherosclerosis of native arteries of extremities, bilateral legs: Secondary | ICD-10-CM | POA: Diagnosis not present

## 2021-07-22 ENCOUNTER — Other Ambulatory Visit: Payer: Self-pay | Admitting: Family Medicine

## 2021-07-22 DIAGNOSIS — I10 Essential (primary) hypertension: Secondary | ICD-10-CM

## 2021-07-23 ENCOUNTER — Other Ambulatory Visit: Payer: Self-pay

## 2021-07-23 NOTE — Telephone Encounter (Signed)
Requested medication (s) are due for refill today: last filled 06/01/21  Requested medication (s) are on the active medication list: yes  Last refill:  06/17/21 #180 1 refill  Future visit scheduled: yes in 2 months  Notes to clinic:  [place] patient requesting 90 day supply      Requested Prescriptions  Pending Prescriptions Disp Refills   carvedilol (COREG) 12.5 MG tablet [Pharmacy Med Name: CARVEDILOL 12.5MG  TABLETS] 180 tablet 1    Sig: TAKE 1 TABLET(12.5 MG) BY MOUTH TWICE DAILY WITH A MEAL     Cardiovascular:  Beta Blockers Failed - 07/22/2021 10:26 PM      Failed - Last BP in normal range    BP Readings from Last 1 Encounters:  06/17/21 (!) 154/73          Passed - Last Heart Rate in normal range    Pulse Readings from Last 1 Encounters:  06/17/21 67          Passed - Valid encounter within last 6 months    Recent Outpatient Visits           1 month ago Type 2 diabetes mellitus with diabetic polyneuropathy, without long-term current use of insulin ([place])   [place], [place], MD   6 months ago Type 2 diabetes mellitus with diabetic polyneuropathy, without long-term current use of insulin ([place])   [place], [place], MD   1 year ago Type 2 diabetes mellitus with diabetic polyneuropathy, without long-term current use of insulin ([place])   [place], Charlane Ferretti, MD   1 year ago Tobacco use   [place], [place], MD   2 years ago Type 2 diabetes mellitus without complication, without long-term current use of insulin [facility])   [place], Vernia Buff, NP       Future Appointments             In 2 months Charlott Rakes, MD [place]

## 2021-07-24 ENCOUNTER — Other Ambulatory Visit: Payer: Self-pay

## 2021-07-27 ENCOUNTER — Other Ambulatory Visit: Payer: Self-pay | Admitting: Family Medicine

## 2021-07-31 ENCOUNTER — Other Ambulatory Visit: Payer: Self-pay | Admitting: Family Medicine

## 2021-07-31 DIAGNOSIS — E1159 Type 2 diabetes mellitus with other circulatory complications: Secondary | ICD-10-CM

## 2021-08-01 NOTE — Telephone Encounter (Signed)
Requested Prescriptions  Pending Prescriptions Disp Refills  . metFORMIN (GLUCOPHAGE) 1000 MG tablet [Pharmacy Med Name: METFORMIN 1000MG TABLETS] 180 tablet 1    Sig: TAKE 1 TABLET(1000 MG) BY MOUTH TWICE DAILY WITH A MEAL     Endocrinology:  Diabetes - Biguanides Failed - 07/31/2021  4:50 PM      Failed - Cr in normal range and within 360 days    Creat  Date Value Ref Range Status  11/01/2015 1.20 (H) 0.50 - 0.99 mg/dL Final   Creatinine, Ser  Date Value Ref Range Status  02/28/2021 1.04 (H) 0.44 - 1.00 mg/dL Final   Creatinine, Urine  Date Value Ref Range Status  11/01/2015 82 20 - 320 mg/dL Final         Failed - AA eGFR in normal range and within 360 days    GFR, Est African American  Date Value Ref Range Status  01/11/2014 66 mL/min Final   GFR calc Af Amer  Date Value Ref Range Status  02/21/2020 55 (L) >59 mL/min/1.73 Final    Comment:    **[facility] currently reports eGFR in compliance with the current**   recommendations of the [employer]. [facility] will   update reporting as new guidelines are published from the NKF-ASN   Task force.    GFR, Est Non African American  Date Value Ref Range Status  01/11/2014 57 (L) mL/min Final    Comment:      The estimated GFR is a calculation valid for adults (>=65 years old) that uses the CKD-EPI algorithm to adjust for age and sex. It is   not to be used for children, pregnant women, hospitalized patients,    patients on dialysis, or with rapidly changing kidney function. According to the NKDEP, eGFR >89 is normal, 60-89 shows mild impairment, 30-59 shows moderate impairment, 15-29 shows severe impairment and <15 is ESRD.     GFR, Estimated  Date Value Ref Range Status  02/28/2021 58 (L) >60 mL/min Final    Comment:    (NOTE) Calculated using the CKD-EPI Creatinine Equation (2021)          Passed - HBA1C is between 0 and 7.9 and within 180 days    HbA1c, POC (controlled diabetic range)  Date Value  Ref Range Status  06/17/2021 7.2 (A) 0.0 - 7.0 % Final         Passed - Valid encounter within last 6 months    Recent Outpatient Visits          1 month ago Type 2 diabetes mellitus with diabetic polyneuropathy, without long-term current use of insulin ([place])   [place], [place], MD   6 months ago Type 2 diabetes mellitus with diabetic polyneuropathy, without long-term current use of insulin ([place])   [place], Luis Lopez, MD   1 year ago Type 2 diabetes mellitus with diabetic polyneuropathy, without long-term current use of insulin ([place])   [place], Charlane Ferretti, MD   1 year ago Tobacco use   [place], [place], MD   2 years ago Type 2 diabetes mellitus without complication, without long-term current use of insulin [facility])   [place], Vernia Buff, NP      Future Appointments            In 2 months Charlott Rakes, MD [place]

## 2021-08-13 DIAGNOSIS — M792 Neuralgia and neuritis, unspecified: Secondary | ICD-10-CM | POA: Diagnosis not present

## 2021-08-27 ENCOUNTER — Other Ambulatory Visit: Payer: Self-pay | Admitting: Family Medicine

## 2021-08-27 DIAGNOSIS — E1159 Type 2 diabetes mellitus with other circulatory complications: Secondary | ICD-10-CM

## 2021-08-27 DIAGNOSIS — I10 Essential (primary) hypertension: Secondary | ICD-10-CM

## 2021-08-27 DIAGNOSIS — D2371 Other benign neoplasm of skin of right lower limb, including hip: Secondary | ICD-10-CM | POA: Diagnosis not present

## 2021-08-27 NOTE — Telephone Encounter (Signed)
Requested Prescriptions  Pending Prescriptions Disp Refills  . glipiZIDE (GLUCOTROL) 10 MG tablet [Pharmacy Med Name: GLIPIZIDE 10MG  TABLETS] 90 tablet 1    Sig: TAKE 1 TABLET BY MOUTH DAILY BEFORE BREAKFAST     Endocrinology:  Diabetes - Sulfonylureas Passed - 08/27/2021  3:42 AM      Passed - HBA1C is between 0 and 7.9 and within 180 days    HbA1c, POC (controlled diabetic range)  Date Value Ref Range Status  06/17/2021 7.2 (A) 0.0 - 7.0 % Final         Passed - Valid encounter within last 6 months    Recent Outpatient Visits          2 months ago Type 2 diabetes mellitus with diabetic polyneuropathy, without long-term current use of insulin ([place])   [place], [place], MD   7 months ago Type 2 diabetes mellitus with diabetic polyneuropathy, without long-term current use of insulin ([place])   [place], [place], MD   1 year ago Type 2 diabetes mellitus with diabetic polyneuropathy, without long-term current use of insulin ([place])   [place], Charlane Ferretti, MD   1 year ago Tobacco use   [place], [place], MD   2 years ago Type 2 diabetes mellitus without complication, without long-term current use of insulin [facility])   [place], Vernia Buff, NP      Future Appointments            In 1 month Charlott Rakes, MD [place]

## 2021-08-27 NOTE — Telephone Encounter (Signed)
Pt should have refills available. On hold with pharmacy extended period of time. Will attempt to clarify later.

## 2021-08-27 NOTE — Telephone Encounter (Signed)
Call to pharmacy- patient only filled 1 month supply 10/12- but does have refills available. Advised patient wants RF. Requested Prescriptions  Pending Prescriptions Disp Refills  . carvedilol (COREG) 12.5 MG tablet [Pharmacy Med Name: CARVEDILOL 12.5MG  TABLETS] 180 tablet 0    Sig: TAKE 1 TABLET(12.5 MG) BY MOUTH TWICE DAILY WITH A MEAL     Cardiovascular:  Beta Blockers Failed - 08/27/2021  9:05 AM      Failed - Last BP in normal range    BP Readings from Last 1 Encounters:  06/17/21 (!) 154/73         Passed - Last Heart Rate in normal range    Pulse Readings from Last 1 Encounters:  06/17/21 67         Passed - Valid encounter within last 6 months    Recent Outpatient Visits          2 months ago Type 2 diabetes mellitus with diabetic polyneuropathy, without long-term current use of insulin ([place])   [place], [place], MD   7 months ago Type 2 diabetes mellitus with diabetic polyneuropathy, without long-term current use of insulin ([place])   [place], [place], MD   1 year ago Type 2 diabetes mellitus with diabetic polyneuropathy, without long-term current use of insulin ([place])   [place], Charlane Ferretti, MD   1 year ago Tobacco use   [place], [place], MD   2 years ago Type 2 diabetes mellitus without complication, without long-term current use of insulin ([place])   [place], Vernia Buff, NP      Future Appointments            In 1 month Charlott Rakes, MD [place]

## 2021-08-29 ENCOUNTER — Telehealth (INDEPENDENT_AMBULATORY_CARE_PROVIDER_SITE_OTHER): Payer: Self-pay

## 2021-08-29 NOTE — Telephone Encounter (Signed)
Call returned to [place] and this issue has already been addressed.  Note added to patient's upcoming appointment at [facility]

## 2021-08-29 NOTE — Telephone Encounter (Signed)
Copied from [place] [id number]. Topic: General - Other >> Aug 26, 2021 12:24 PM Celene Kras wrote: Reason for CRM: Lauralyn Primes, from [facility], calling and is requesting to speak with Opal Sidles asap in regards to pts upcoming appt.   Please advise.

## 2021-09-10 DIAGNOSIS — D2371 Other benign neoplasm of skin of right lower limb, including hip: Secondary | ICD-10-CM | POA: Diagnosis not present

## 2021-09-18 ENCOUNTER — Other Ambulatory Visit: Payer: Self-pay | Admitting: Family Medicine

## 2021-09-18 ENCOUNTER — Other Ambulatory Visit: Payer: Self-pay

## 2021-09-18 DIAGNOSIS — M1712 Unilateral primary osteoarthritis, left knee: Secondary | ICD-10-CM

## 2021-09-19 ENCOUNTER — Other Ambulatory Visit: Payer: Self-pay

## 2021-09-20 ENCOUNTER — Other Ambulatory Visit: Payer: Self-pay

## 2021-09-20 MED ORDER — ACETAMINOPHEN-CODEINE #3 300-30 MG PO TABS
1.0000 | ORAL_TABLET | Freq: Two times a day (BID) | ORAL | 0 refills | Status: DC | PRN
  Filled 2021-09-20: qty 60, 30d supply, fill #0

## 2021-09-20 NOTE — Telephone Encounter (Signed)
Requested medication (s) are due for refill today: yes  Requested medication (s) are on the active medication list: yes  Last refill:  06/21/21  Future visit scheduled: 09/30/21  Notes to clinic:  This medication can not be delegated, please assess.        Requested Prescriptions  Pending Prescriptions Disp Refills   acetaminophen-codeine (TYLENOL #3) 300-30 MG tablet 60 tablet 0    Sig: Take 1 tablet by mouth every 12 (twelve) hours as needed for moderate pain.     Not Delegated - Analgesics:  Opioid Agonist Combinations Failed - 09/18/2021 12:56 PM      Failed - This refill cannot be delegated      Failed - Urine Drug Screen completed in last 360 days      Passed - Valid encounter within last 6 months    Recent Outpatient Visits           3 months ago Type 2 diabetes mellitus with diabetic polyneuropathy, without long-term current use of insulin ([place])   [place], [place], MD   7 months ago Type 2 diabetes mellitus with diabetic polyneuropathy, without long-term current use of insulin ([place])   [place], [place], MD   1 year ago Type 2 diabetes mellitus with diabetic polyneuropathy, without long-term current use of insulin ([place])   [place], Charlane Ferretti, MD   1 year ago Tobacco use   [place], Charlane Ferretti, MD   2 years ago Type 2 diabetes mellitus without complication, without long-term current use of insulin [facility])   [place], Vernia Buff, NP       Future Appointments             In 1 week Charlott Rakes, MD [place]

## 2021-09-24 ENCOUNTER — Other Ambulatory Visit: Payer: Self-pay

## 2021-09-24 DIAGNOSIS — E119 Type 2 diabetes mellitus without complications: Secondary | ICD-10-CM | POA: Diagnosis not present

## 2021-09-24 DIAGNOSIS — H40033 Anatomical narrow angle, bilateral: Secondary | ICD-10-CM | POA: Diagnosis not present

## 2021-09-24 DIAGNOSIS — H2513 Age-related nuclear cataract, bilateral: Secondary | ICD-10-CM | POA: Diagnosis not present

## 2021-09-24 LAB — HM DIABETES EYE EXAM

## 2021-09-26 ENCOUNTER — Other Ambulatory Visit: Payer: Self-pay | Admitting: Family Medicine

## 2021-09-26 DIAGNOSIS — I10 Essential (primary) hypertension: Secondary | ICD-10-CM

## 2021-09-26 NOTE — Telephone Encounter (Signed)
Requested Prescriptions  Pending Prescriptions Disp Refills  . hydrochlorothiazide (HYDRODIURIL) 25 MG tablet [Pharmacy Med Name: HYDROCHLOROTHIAZIDE 25MG  TABLETS] 90 tablet 0    Sig: TAKE 1 TABLET(25 MG) BY MOUTH DAILY     Cardiovascular: Diuretics - Thiazide Failed - 09/26/2021  3:42 AM      Failed - Cr in normal range and within 360 days    Creat  Date Value Ref Range Status  11/01/2015 1.20 (H) 0.50 - 0.99 mg/dL Final   Creatinine, Ser  Date Value Ref Range Status  02/28/2021 1.04 (H) 0.44 - 1.00 mg/dL Final   Creatinine, Urine  Date Value Ref Range Status  11/01/2015 82 20 - 320 mg/dL Final         Failed - Last BP in normal range    BP Readings from Last 1 Encounters:  06/17/21 (!) 154/73         Passed - Ca in normal range and within 360 days    Calcium  Date Value Ref Range Status  02/28/2021 9.1 8.9 - 10.3 mg/dL Final   Calcium, Ion  Date Value Ref Range Status  02/11/2021 1.25 1.15 - 1.40 mmol/L Final         Passed - K in normal range and within 360 days    Potassium  Date Value Ref Range Status  02/28/2021 4.3 3.5 - 5.1 mmol/L Final         Passed - Na in normal range and within 360 days    Sodium  Date Value Ref Range Status  02/28/2021 136 135 - 145 mmol/L Final  02/21/2020 141 134 - 144 mmol/L Final         Passed - Valid encounter within last 6 months    Recent Outpatient Visits          3 months ago Type 2 diabetes mellitus with diabetic polyneuropathy, without long-term current use of insulin (HCC)   [place], [place], MD   8 months ago Type 2 diabetes mellitus with diabetic polyneuropathy, without long-term current use of insulin ([place])   [place], [place], MD   1 year ago Type 2 diabetes mellitus with diabetic polyneuropathy, without long-term current use of insulin ([place])   [place], Charlane Ferretti, MD   1 year ago Tobacco use    [place], [place], MD   2 years ago Type 2 diabetes mellitus without complication, without long-term current use of insulin [facility])   [place], Vernia Buff, NP      Future Appointments            In 5 days Charlott Rakes, MD [place]

## 2021-09-27 ENCOUNTER — Other Ambulatory Visit: Payer: Self-pay | Admitting: Family Medicine

## 2021-09-27 DIAGNOSIS — M1712 Unilateral primary osteoarthritis, left knee: Secondary | ICD-10-CM

## 2021-09-27 DIAGNOSIS — E1142 Type 2 diabetes mellitus with diabetic polyneuropathy: Secondary | ICD-10-CM

## 2021-09-27 NOTE — Telephone Encounter (Signed)
Requested Prescriptions  Pending Prescriptions Disp Refills  . atorvastatin (LIPITOR) 10 MG tablet [Pharmacy Med Name: ATORVASTATIN 10MG  TABLETS] 90 tablet 1    Sig: TAKE 1 TABLET(10 MG) BY MOUTH DAILY     Cardiovascular:  Antilipid - Statins Passed - 09/27/2021  4:15 PM      Passed - Total Cholesterol in normal range and within 360 days    Cholesterol, Total  Date Value Ref Range Status  07/01/2019 184 100 - 199 mg/dL Final   Cholesterol  Date Value Ref Range Status  02/28/2021 109 0 - 200 mg/dL Final         Passed - LDL in normal range and within 360 days    LDL Chol Calc (NIH)  Date Value Ref Range Status  07/01/2019 102 (H) 0 - 99 mg/dL Final   LDL Cholesterol  Date Value Ref Range Status  02/28/2021 48 0 - 99 mg/dL Final    Comment:           Total Cholesterol/HDL:CHD Risk Coronary Heart Disease Risk Table                     Men   Women  1/2 Average Risk   3.4   3.3  Average Risk       5.0   4.4  2 X Average Risk   9.6   7.1  3 X Average Risk  23.4   11.0        Use the calculated Patient Ratio above and the CHD Risk Table to determine the patient's CHD Risk.        ATP III CLASSIFICATION (LDL):  <100     mg/dL   Optimal  100-129  mg/dL   Near or Above                    Optimal  130-159  mg/dL   Borderline  160-189  mg/dL   High  >190     mg/dL   Very High Performed at [facility] [address], [place], [place] [address]          Passed - HDL in normal range and within 360 days    HDL  Date Value Ref Range Status  02/28/2021 49 >40 mg/dL Final  07/01/2019 47 >39 mg/dL Final         Passed - Triglycerides in normal range and within 360 days    Triglycerides  Date Value Ref Range Status  02/28/2021 59 <150 mg/dL Final         Passed - Patient is not pregnant      Passed - Valid encounter within last 12 months    Recent Outpatient Visits          3 months ago Type 2 diabetes mellitus with diabetic polyneuropathy, without long-term  current use of insulin ([place])   [place], Callender, MD   8 months ago Type 2 diabetes mellitus with diabetic polyneuropathy, without long-term current use of insulin ([place])   [place], Gahanna, MD   1 year ago Type 2 diabetes mellitus with diabetic polyneuropathy, without long-term current use of insulin ([place])   [place], Charlane Ferretti, MD   1 year ago Tobacco use   [place], Charlane Ferretti, MD   2 years ago Type 2 diabetes mellitus without complication, without long-term current use  of insulin [facility])   [place], Vernia Buff, NP      Future Appointments            In 4 days Charlott Rakes, MD [place]

## 2021-09-30 ENCOUNTER — Ambulatory Visit: Payer: BC Managed Care – PPO | Admitting: Family Medicine

## 2021-10-01 ENCOUNTER — Encounter: Payer: Self-pay | Admitting: Family Medicine

## 2021-10-01 ENCOUNTER — Ambulatory Visit: Payer: BC Managed Care – PPO | Attending: Family Medicine | Admitting: Family Medicine

## 2021-10-01 ENCOUNTER — Other Ambulatory Visit: Payer: Self-pay

## 2021-10-01 VITALS — BP 160/79 | HR 67 | Wt 161.8 lb

## 2021-10-01 DIAGNOSIS — E1159 Type 2 diabetes mellitus with other circulatory complications: Secondary | ICD-10-CM | POA: Diagnosis not present

## 2021-10-01 DIAGNOSIS — I152 Hypertension secondary to endocrine disorders: Secondary | ICD-10-CM

## 2021-10-01 DIAGNOSIS — M1712 Unilateral primary osteoarthritis, left knee: Secondary | ICD-10-CM | POA: Diagnosis not present

## 2021-10-01 DIAGNOSIS — E1142 Type 2 diabetes mellitus with diabetic polyneuropathy: Secondary | ICD-10-CM | POA: Diagnosis not present

## 2021-10-01 DIAGNOSIS — I739 Peripheral vascular disease, unspecified: Secondary | ICD-10-CM | POA: Diagnosis not present

## 2021-10-01 DIAGNOSIS — Z1159 Encounter for screening for other viral diseases: Secondary | ICD-10-CM | POA: Diagnosis not present

## 2021-10-01 DIAGNOSIS — Z1211 Encounter for screening for malignant neoplasm of colon: Secondary | ICD-10-CM

## 2021-10-01 LAB — POCT GLYCOSYLATED HEMOGLOBIN (HGB A1C): HbA1c, POC (controlled diabetic range): 6.7 % (ref 0.0–7.0)

## 2021-10-01 LAB — GLUCOSE, POCT (MANUAL RESULT ENTRY): POC Glucose: 129 mg/dl — AB (ref 70–99)

## 2021-10-01 MED ORDER — ACCU-CHEK FASTCLIX LANCETS MISC: 11 refills | Status: DC

## 2021-10-01 MED ORDER — GLIPIZIDE 10 MG PO TABS: ORAL_TABLET | ORAL | 1 refills | Status: DC

## 2021-10-01 MED ORDER — CARVEDILOL 12.5 MG PO TABS: 12.5000 mg | ORAL_TABLET | Freq: Two times a day (BID) | ORAL | 0 refills | Status: DC

## 2021-10-01 MED ORDER — GABAPENTIN 300 MG PO CAPS: ORAL_CAPSULE | ORAL | 0 refills | Status: DC

## 2021-10-01 MED ORDER — AMLODIPINE BESYLATE 2.5 MG PO TABS: 2.5000 mg | ORAL_TABLET | Freq: Every day | ORAL | 1 refills | Status: DC

## 2021-10-01 MED ORDER — ACCU-CHEK FASTCLIX LANCETS MISC: 3 refills | Status: DC

## 2021-10-01 NOTE — Progress Notes (Signed)
Discuss HCTZ medication.  Medication refills

## 2021-10-01 NOTE — Progress Notes (Signed)
Subjective:  Patient ID: Tammy Graham, female    DOB: January 20, 1951  Age: 70 y.o. MRN: [id number]  CC: Diabetes   HPI [name] is a 70 y.o. year old female with a history of coronary artery disease (status post DES), type 2 diabetes mellitus (A1c 6.7), hypertension, PAD (s/p right common femoral to above-knee popliteal bypass in 02/2021), L knee OA, tobacco abuse who presents today for chronic disease management.  Interval History: Her L knee hurts and is swollen and this is worse after she has been on her feet all day at work.  She uses Tylenol 3 sparingly for severe pain.  She states she has been seeing advertisement stating people need to come off HCTZ but she has continued taking her son has questions about this.  Her blood pressure is elevated and she attributes this to being stressed.  On review of her previous visits her blood pressure was also elevated.  She endorses compliance with her medications.  Her A1c is 6.7 and she denies hypoglycemic episodes.  She has been adherent with a diabetic diet and gets a lot of exercise at work.  Up-to-date on annual eye exams. Currently on gabapentin for neuropathy and Cymbalta has been added due to uncontrolled neuropathy at her last visit but she was unable to tolerate Cymbalta due to GI side effects. She comes in to see vascular for follow-up of her peripheral vascular disease. Past Medical History:  Diagnosis Date   Anxiety    Arthritis    shoulder and knees   CAD (coronary artery disease)    Complication of anesthesia    states she was still awake one time when surgery started   DM (diabetes mellitus) ([place])    HTN (hypertension)    Hyperlipemia    Myocardial infarction [facility])    Peripheral vascular disease ([place])    Pneumonia    Sleep apnea    no cpap   Stroke [facility])    2012    Past Surgical History:  Procedure Laterality Date   ABDOMINAL AORTOGRAM W/LOWER EXTREMITY Bilateral 02/11/2021   Procedure: ABDOMINAL AORTOGRAM  W/LOWER EXTREMITY;  Surgeon: Waynetta Sandy, MD;  Location: [facility];  Service: Cardiovascular;  Laterality: Bilateral;   CARDIAC CATHETERIZATION     COLONOSCOPY     COLONOSCOPY  09/25/2011   Procedure: COLONOSCOPY;  Surgeon: Winfield Cunas., MD;  Location: [facility];  Service: Endoscopy;  Laterality: N/A;   CORONARY STENT PLACEMENT     Ostial LAD stent in 2012   FEMORAL-POPLITEAL BYPASS GRAFT Right 02/27/2021   Procedure: RIGHT COMMON FEMORAL- ABOVE KNEE POPLITEAL ARTERY BYPASS GRAFT;  Surgeon: Waynetta Sandy, MD;  Location: [facility];  Service: Vascular;  Laterality: Right;   Left knee surgery      Family History  Problem Relation Age of Onset   Heart attack Mother    Anesthesia problems Neg Hx    Hypotension Neg Hx    Malignant hyperthermia Neg Hx    Pseudochol deficiency Neg Hx     Allergies  Allergen Reactions   Benadryl [Diphenhydramine Hcl (Sleep)] Other (See Comments)    unknown   Rosuvastatin Hives    Patient is allergic to generic rosuvastatin. She says she is able to take branded Crestor.    Simvastatin Other (See Comments)    unknown    Outpatient Medications Prior to Visit  Medication Sig Dispense Refill   ACCU-CHEK AVIVA PLUS test strip USE AS INSTRUCTED 100 strip 0  ACCU-CHEK GUIDE test strip USE AS INSTRUCTED 100 strip 1   acetaminophen-codeine (TYLENOL #3) 300-30 MG tablet Take 1 tablet by mouth every 12 (twelve) hours as needed for moderate pain. 60 tablet 0   aspirin EC 81 MG tablet Take 81 mg by mouth in the morning. Swallow whole.     atorvastatin (LIPITOR) 10 MG tablet TAKE 1 TABLET(10 MG) BY MOUTH DAILY 90 tablet 0   Blood Glucose Monitoring Suppl (ACCU-CHEK GUIDE ME) w/Device KIT 1 kit by Does not apply route daily. Use to check blood sugar daily. 1 kit 0   carboxymethylcellul-glycerin (LUBRICANT DROPS/DUAL-ACTION) 0.5-0.9 % ophthalmic solution Place 1-2 drops into both eyes 3 (three) times daily as needed for dry eyes.      glucose blood test strip Use as instructed 100 each 12   hydrochlorothiazide (HYDRODIURIL) 25 MG tablet TAKE 1 TABLET(25 MG) BY MOUTH DAILY 90 tablet 0   metFORMIN (GLUCOPHAGE) 1000 MG tablet TAKE 1 TABLET(1000 MG) BY MOUTH TWICE DAILY WITH A MEAL 180 tablet 1   Misc. Devices MISC Shower bench, Above the toilet seat.  Diagnosis- Type 2 Diabetes Mellitus 1 each 0   Multiple Vitamin (MULTIVITAMIN WITH MINERALS) TABS tablet Take 1 tablet by mouth in the morning.     nicotine (NICODERM CQ) 7 mg/24hr patch Place 1 patch (7 mg total) onto the skin daily. 28 patch 1   nitroGLYCERIN (NITROSTAT) 0.4 MG SL tablet Place 0.4 mg under the tongue every 5 (five) minutes as needed for chest pain.     oxyCODONE-acetaminophen (PERCOCET/ROXICET) 5-325 MG tablet Take 1 tablet by mouth every 4 (four) hours as needed for moderate pain or severe pain. 30 tablet 0   PRESCRIPTION MEDICATION Apply 1 application topically in the morning and at bedtime. [place]a Foot Cream     Accu-Chek FastClix Lancets MISC USE ONCE DAILY AS DIRECTED 102 each 3   Accu-Chek FastClix Lancets MISC USE AS INSTRUCTED 102 each 11   carvedilol (COREG) 12.5 MG tablet TAKE 1 TABLET(12.5 MG) BY MOUTH TWICE DAILY WITH A MEAL 180 tablet 0   gabapentin (NEURONTIN) 300 MG capsule TAKE 1 CAPSULE(300 MG) BY MOUTH AT BEDTIME 90 capsule 0   glipiZIDE (GLUCOTROL) 10 MG tablet TAKE 1 TABLET BY MOUTH DAILY BEFORE BREAKFAST 90 tablet 1   DULoxetine (CYMBALTA) 60 MG capsule Take 1 capsule (60 mg total) by mouth daily. (Patient not taking: Reported on 10/01/2021) 30 capsule 3   No facility-administered medications prior to visit.     ROS Review of Systems  Constitutional:  Negative for activity change, appetite change and fatigue.  HENT:  Negative for congestion, sinus pressure and sore throat.   Eyes:  Negative for visual disturbance.  Respiratory:  Negative for cough, chest tightness, shortness of breath and wheezing.   Cardiovascular:  Negative for  chest pain and palpitations.  Gastrointestinal:  Negative for abdominal distention, abdominal pain and constipation.  Endocrine: Negative for polydipsia.  Genitourinary:  Negative for dysuria and frequency.  Musculoskeletal:        See HPI  Skin:  Negative for rash.  Neurological:  Negative for tremors, light-headedness and numbness.  Hematological:  Does not bruise/bleed easily.  Psychiatric/Behavioral:  Negative for agitation and behavioral problems.    Objective:  BP (!) 160/79    Pulse 67    Wt 161 lb 12.8 oz (73.4 kg)    SpO2 97%    BMI 26.12 kg/m   BP/Weight 10/01/2021 6/37/8588 5/0/2774  Systolic BP 128 786 767  Diastolic BP  79 73 78  Wt. (Lbs) 161.8 163.2 160.9  BMI 26.12 26.34 25.58      Physical Exam Constitutional:      Appearance: She is well-developed.  Cardiovascular:     Rate and Rhythm: Normal rate.     Heart sounds: Normal heart sounds. No murmur heard. Pulmonary:     Effort: Pulmonary effort is normal.     Breath sounds: Normal breath sounds. No wheezing or rales.  Chest:     Chest wall: No tenderness.  Abdominal:     General: Bowel sounds are normal. There is no distension.     Palpations: Abdomen is soft. There is no mass.     Tenderness: There is no abdominal tenderness.  Musculoskeletal:     Right lower leg: No edema.     Left lower leg: No edema.     Comments: Limited range of motion of left knee Passive flexion and extension with associated pain  Neurological:     Mental Status: She is alert and oriented to person, place, and time.     Gait: Gait abnormal.  Psychiatric:        Mood and Affect: Mood normal.    CMP Latest Ref Rng & Units 02/28/2021 02/27/2021 02/27/2021  Glucose 70 - 99 mg/dL 264(H) - 150(H)  BUN 8 - 23 mg/dL 10 - 9  Creatinine 0.44 - 1.00 mg/dL 1.04(H) 0.95 0.91  Sodium 135 - 145 mmol/L 136 - 139  Potassium 3.5 - 5.1 mmol/L 4.3 - 4.1  Chloride 98 - 111 mmol/L 102 - 104  CO2 22 - 32 mmol/L 26 - 29  Calcium 8.9 - 10.3 mg/dL  9.1 - 10.1  Total Protein 6.5 - 8.1 g/dL - - 7.0  Total Bilirubin 0.3 - 1.2 mg/dL - - 0.9  Alkaline Phos 38 - 126 U/L - - 75  AST 15 - 41 U/L - - 21  ALT 0 - 44 U/L - - 20    Lipid Panel     Component Value Date/Time   CHOL 109 02/28/2021 0155   CHOL 184 07/01/2019 1550   TRIG 59 02/28/2021 0155   HDL 49 02/28/2021 0155   HDL 47 07/01/2019 1550   CHOLHDL 2.2 02/28/2021 0155   VLDL 12 02/28/2021 0155   LDLCALC 48 02/28/2021 0155   LDLCALC 102 (H) 07/01/2019 1550    CBC    Component Value Date/Time   WBC 13.5 (H) 02/28/2021 0155   RBC 3.61 (L) 02/28/2021 0155   HGB 11.8 (L) 02/28/2021 0155   HGB 15.3 07/01/2019 1550   HCT 34.4 (L) 02/28/2021 0155   HCT 46.5 07/01/2019 1550   PLT 193 02/28/2021 0155   PLT 257 07/01/2019 1550   MCV 95.3 02/28/2021 0155   MCV 98 (H) 07/01/2019 1550   MCH 32.7 02/28/2021 0155   MCHC 34.3 02/28/2021 0155   RDW 12.7 02/28/2021 0155   RDW 12.6 07/01/2019 1550   LYMPHSABS 2.9 09/11/2016 2316   MONOABS 0.4 09/11/2016 2316   EOSABS 0.2 09/11/2016 2316   BASOSABS 0.0 09/11/2016 2316    Lab Results  Component Value Date   HGBA1C 6.7 10/01/2021    Assessment & Plan:  1. Type 2 diabetes mellitus with diabetic polyneuropathy, without long-term current use of insulin (HCC) Controlled with A1c of 6.7 Continue current regimen Counseled on Diabetic diet, my plate method, 026 minutes of moderate intensity exercise/week Blood sugar logs with fasting goals of 80-120 mg/dl, random of less than 180 and in the event  of sugars less than 60 mg/dl or greater than 400 mg/dl encouraged to notify the clinic. Advised on the need for annual eye exams, annual foot exams, Pneumonia vaccine. - POCT glucose (manual entry) - POCT glycosylated hemoglobin (Hb Z6O) - Basic Metabolic Panel - Accu-Chek FastClix Lancets MISC; USE AS INSTRUCTED  Dispense: 102 each; Refill: 11 - gabapentin (NEURONTIN) 300 MG capsule; TAKE 1 CAPSULE(300 MG) BY MOUTH AT BEDTIME  Dispense:  90 capsule; Refill: 0 - Accu-Chek FastClix Lancets MISC; Use daily as directed  Dispense: 102 each; Refill: 3 - CMP14+EGFR - LP+Non-HDL Cholesterol  2. Hypertension associated with diabetes ([place]) Uncontrolled Amlodipine added to regimen Counseled on blood pressure goal of less than 130/80, low-sodium, DASH diet, medication compliance, 150 minutes of moderate intensity exercise per week. Discussed medication compliance, adverse effects. - amLODipine (NORVASC) 2.5 MG tablet; Take 1 tablet (2.5 mg total) by mouth daily.  Dispense: 90 tablet; Refill: 1 - carvedilol (COREG) 12.5 MG tablet; Take 1 tablet (12.5 mg total) by mouth 2 (two) times daily with a meal.  Dispense: 180 tablet; Refill: 0  3. Type 2 diabetes mellitus with other circulatory complication, without long-term current use of insulin (HCC) See #1 above - glipiZIDE (GLUCOTROL) 10 MG tablet; TAKE 1 TABLET BY MOUTH DAILY BEFORE BREAKFAST  Dispense: 90 tablet; Refill: 1  4. Need for hepatitis C screening test - HCV Ab w Reflex to Quant PCR  5. Screening for colon cancer - Ambulatory referral to Gastroenterology  6. Peripheral arterial disease (HCC) s/p right common femoral to above-knee popliteal bypass Risk factor modification  7. Osteoarthritis of left knee, unspecified osteoarthritis type Stable with intermittent flares She is on Tylenol 3 as needed Advised to obtain knee brace   Health Care Maintenance: Declines flu shot and Pneumovax Meds ordered this encounter  Medications   amLODipine (NORVASC) 2.5 MG tablet    Sig: Take 1 tablet (2.5 mg total) by mouth daily.    Dispense:  90 tablet    Refill:  1   Accu-Chek FastClix Lancets MISC    Sig: USE AS INSTRUCTED    Dispense:  102 each    Refill:  11   carvedilol (COREG) 12.5 MG tablet    Sig: Take 1 tablet (12.5 mg total) by mouth 2 (two) times daily with a meal.    Dispense:  180 tablet    Refill:  0    **Patient requests 90 days supply**   gabapentin  (NEURONTIN) 300 MG capsule    Sig: TAKE 1 CAPSULE(300 MG) BY MOUTH AT BEDTIME    Dispense:  90 capsule    Refill:  0   glipiZIDE (GLUCOTROL) 10 MG tablet    Sig: TAKE 1 TABLET BY MOUTH DAILY BEFORE BREAKFAST    Dispense:  90 tablet    Refill:  1   Accu-Chek FastClix Lancets MISC    Sig: Use daily as directed    Dispense:  102 each    Refill:  3     Follow-up: Return in about 3 months (around 12/30/2021) for Chronic medical conditions.       Charlott Rakes, MD, FAAFP. [facility] and [place], [place]   10/01/2021, 11:56 AM

## 2021-10-01 NOTE — Patient Instructions (Addendum)
How to Use a Knee Brace A knee brace is a device that you wear to support your knee, especially if you have arthritis or the knee is healing after an injury or surgery. There are several types of knee braces. Some are designed to prevent an injury (prophylactic brace). These are often worn during sports. Others support an injured knee (functional brace) or keep it still while it heals (rehabilitative brace). People with severe arthritis of the knee may benefit from a brace that takes some pressure off the knee (unloader brace). Most knee braces are made from a combination of cloth and metal or plastic. You may need to wear a knee brace: To relieve knee pain. To help your knee support your weight (improve stability). To help you walk farther, or to move more easily (improve mobility). To prevent injury. To support your knee while it heals from surgery or from an injury. What are the risks? Generally, knee braces are safe to wear. However, problems may occur, including: Skin irritation that may cause pain and lead to infection. Making your condition worse if you wear the brace in the wrong way. How to use a knee brace Different braces will have different instructions for use. Your health care provider will tell you or show you: How to put on your brace. How to adjust the brace. When and how often to wear the brace. How to remove the brace. If you need any assistive devices in addition to the brace, such as crutches or a cane. In general, your brace should: Have the hinge of the brace line up with the bend of your knee. Have straps, hooks, or tapes that fasten snugly around your leg. Not feel too tight or too loose. How to care for a knee brace Check your brace often for signs of damage, such as loose connections or attachments. Your knee brace may get damaged or wear out during normal use. Wash the fabric parts of your brace with soap and water. Read the insert that comes with your brace for  other specific care instructions. Contact a health care provider if your knee brace: Is too loose or too tight and you cannot adjust it. Causes pain, skin redness, swelling, bruising, or irritation. Is not helping to relieve your problem. Is making your knee pain worse. Summary A knee brace is a device that you wear to support your knee, especially if you have arthritis or your knee is healing after an injury or surgery. Different braces will have different instructions for use. Your health care provider will tell you or show you how to use your knee brace. Check your brace often for signs of damage, such as loose connections or attachments. Your knee brace may get damaged or wear out during normal use. Wear your knee brace as told by your health care provider. Contact a health care provider if your knee brace is not helping to relieve your problem or is making your knee pain worse. This information is not intended to replace advice given to you by your health care provider. Make sure you discuss any questions you have with your health care provider. Document Revised: 04/21/2018 Document Reviewed: 04/21/2018 Elsevier Patient Education  [place].

## 2021-10-02 LAB — CMP14+EGFR
ALT: 18 IU/L (ref 0–32)
AST: 21 IU/L (ref 0–40)
Albumin/Globulin Ratio: 2.1 (ref 1.2–2.2)
Albumin: 4.8 g/dL (ref 3.8–4.8)
Alkaline Phosphatase: 91 IU/L (ref 44–121)
BUN/Creatinine Ratio: 11 — ABNORMAL LOW (ref 12–28)
BUN: 12 mg/dL (ref 8–27)
Bilirubin Total: 0.8 mg/dL (ref 0.0–1.2)
CO2: 26 mmol/L (ref 20–29)
Calcium: 10.8 mg/dL — ABNORMAL HIGH (ref 8.7–10.3)
Chloride: 102 mmol/L (ref 96–106)
Creatinine, Ser: 1.1 mg/dL — ABNORMAL HIGH (ref 0.57–1.00)
Globulin, Total: 2.3 g/dL (ref 1.5–4.5)
Glucose: 116 mg/dL — ABNORMAL HIGH (ref 70–99)
Potassium: 4.3 mmol/L (ref 3.5–5.2)
Sodium: 143 mmol/L (ref 134–144)
Total Protein: 7.1 g/dL (ref 6.0–8.5)
eGFR: 54 mL/min/{1.73_m2} — ABNORMAL LOW (ref >59)

## 2021-10-02 LAB — HCV INTERPRETATION

## 2021-10-02 LAB — LP+NON-HDL CHOLESTEROL
Cholesterol, Total: 130 mg/dL (ref 100–199)
HDL: 55 mg/dL (ref >39)
LDL Chol Calc (NIH): 60 mg/dL (ref 0–99)
Total Non-HDL-Chol (LDL+VLDL): 75 mg/dL (ref 0–129)
Triglycerides: 73 mg/dL (ref 0–149)
VLDL Cholesterol Cal: 15 mg/dL (ref 5–40)

## 2021-10-02 LAB — HCV AB W REFLEX TO QUANT PCR: HCV Ab: <0.1 s/co ratio (ref 0.0–0.9)

## 2021-10-04 ENCOUNTER — Telehealth: Payer: Self-pay

## 2021-10-04 NOTE — Telephone Encounter (Signed)
Patient was called and a voicemail was left informing patient to return phone call for lab results.   CRM created.

## 2021-10-04 NOTE — Telephone Encounter (Signed)
-----   Message from Charlott Rakes, MD sent at 10/02/2021  5:43 PM EST ----- Please inform her that her cholesterol is normal, kidney and liver functions are normal.  Calcium is slightly high.  If she takes OTC calcium supplements I would like her to hold off on this.

## 2021-10-11 ENCOUNTER — Telehealth: Payer: Self-pay

## 2021-10-11 NOTE — Telephone Encounter (Signed)
Patient was called and a voicemail was left informing patient to return phone call for lab results.   Letter has been mailed.

## 2021-10-11 NOTE — Telephone Encounter (Signed)
-----   Message from Charlott Rakes, MD sent at 10/02/2021  5:43 PM EST ----- Please inform her that her cholesterol is normal, kidney and liver functions are normal.  Calcium is slightly high.  If she takes OTC calcium supplements I would like her to hold off on this.

## 2021-10-16 ENCOUNTER — Telehealth: Payer: Self-pay

## 2021-10-16 DIAGNOSIS — E1159 Type 2 diabetes mellitus with other circulatory complications: Secondary | ICD-10-CM

## 2021-10-16 MED ORDER — AMLODIPINE BESYLATE 5 MG PO TABS: 5.0000 mg | ORAL_TABLET | Freq: Every day | ORAL | 1 refills | Status: DC

## 2021-10-16 NOTE — Telephone Encounter (Signed)
-----   Message from Charlott Rakes, MD sent at 10/02/2021  5:43 PM EST ----- Please inform her that her cholesterol is normal, kidney and liver functions are normal.  Calcium is slightly high.  If she takes OTC calcium supplements I would like her to hold off on this.

## 2021-10-16 NOTE — Telephone Encounter (Signed)
Pt was informed of lab results ans to stop OTC calcium, pt states that she takes a multivitamin daily and she does not want to stop taking it due to it giving her energy. Pt would like to know what else she can do.

## 2021-10-16 NOTE — Telephone Encounter (Signed)
Okay please inform her that I have discontinued HCTZ which can also cause elevated calcium but as a result I increased her amlodipine from 2.5 mg to 5 mg.  If she has a 2.5 mg at home she can take 2 of the 2.5 mg tablets to make 5 mg.

## 2021-10-17 NOTE — Telephone Encounter (Signed)
Unable to reach Mailbox is full

## 2021-10-17 NOTE — Telephone Encounter (Signed)
Pt was called and Vm is still full. Patient has been sent a mychart message regarding medications, letter has been mailed to patient also.

## 2021-11-07 ENCOUNTER — Other Ambulatory Visit: Payer: Self-pay | Admitting: Family Medicine

## 2021-11-07 DIAGNOSIS — I10 Essential (primary) hypertension: Secondary | ICD-10-CM

## 2021-11-07 NOTE — Telephone Encounter (Signed)
Requested Prescriptions  Pending Prescriptions Disp Refills   hydrochlorothiazide (HYDRODIURIL) 25 MG tablet [Pharmacy Med Name: HYDROCHLOROTHIAZIDE 25MG  TABLETS] 90 tablet 0    Sig: TAKE 1 TABLET(25 MG) BY MOUTH DAILY     Cardiovascular: Diuretics - Thiazide Failed - 11/07/2021  8:28 PM      Failed - Ca in normal range and within 360 days    Calcium  Date Value Ref Range Status  10/01/2021 10.8 (H) 8.7 - 10.3 mg/dL Final   Calcium, Ion  Date Value Ref Range Status  02/11/2021 1.25 1.15 - 1.40 mmol/L Final         Failed - Cr in normal range and within 360 days    Creat  Date Value Ref Range Status  11/01/2015 1.20 (H) 0.50 - 0.99 mg/dL Final   Creatinine, Ser  Date Value Ref Range Status  10/01/2021 1.10 (H) 0.57 - 1.00 mg/dL Final   Creatinine, Urine  Date Value Ref Range Status  11/01/2015 82 20 - 320 mg/dL Final         Failed - Last BP in normal range    BP Readings from Last 1 Encounters:  10/01/21 (!) 160/79         Passed - K in normal range and within 360 days    Potassium  Date Value Ref Range Status  10/01/2021 4.3 3.5 - 5.2 mmol/L Final         Passed - Na in normal range and within 360 days    Sodium  Date Value Ref Range Status  10/01/2021 143 134 - 144 mmol/L Final         Passed - Valid encounter within last 6 months    Recent Outpatient Visits          1 month ago Type 2 diabetes mellitus with diabetic polyneuropathy, without long-term current use of insulin (HCC)   [place], [place], MD   4 months ago Type 2 diabetes mellitus with diabetic polyneuropathy, without long-term current use of insulin ([place])   [place], [place], MD   9 months ago Type 2 diabetes mellitus with diabetic polyneuropathy, without long-term current use of insulin ([place])   [employer], [place], MD   1 year ago Type 2 diabetes mellitus with diabetic  polyneuropathy, without long-term current use of insulin ([place])   [place], Charlane Ferretti, MD   1 year ago Tobacco use   [place], [place], MD      Future Appointments            In 1 month Charlott Rakes, MD [place]            Not on current med list.

## 2021-11-25 DIAGNOSIS — D2371 Other benign neoplasm of skin of right lower limb, including hip: Secondary | ICD-10-CM | POA: Diagnosis not present

## 2021-11-30 ENCOUNTER — Other Ambulatory Visit: Payer: Self-pay | Admitting: Family Medicine

## 2021-11-30 DIAGNOSIS — E1159 Type 2 diabetes mellitus with other circulatory complications: Secondary | ICD-10-CM

## 2021-12-02 ENCOUNTER — Other Ambulatory Visit: Payer: Self-pay | Admitting: Family Medicine

## 2021-12-02 ENCOUNTER — Ambulatory Visit: Payer: Self-pay

## 2021-12-02 DIAGNOSIS — M1712 Unilateral primary osteoarthritis, left knee: Secondary | ICD-10-CM

## 2021-12-02 NOTE — Telephone Encounter (Signed)
Requested Prescriptions  Pending Prescriptions Disp Refills   carvedilol (COREG) 12.5 MG tablet [Pharmacy Med Name: CARVEDILOL 12.5MG  TABLETS] 180 tablet 0    Sig: TAKE 1 TABLET(12.5 MG) BY MOUTH TWICE DAILY WITH A MEAL     Cardiovascular: Beta Blockers 3 Failed - 11/30/2021  9:14 AM      Failed - Cr in normal range and within 360 days    Creat  Date Value Ref Range Status  11/01/2015 1.20 (H) 0.50 - 0.99 mg/dL Final   Creatinine, Ser  Date Value Ref Range Status  10/01/2021 1.10 (H) 0.57 - 1.00 mg/dL Final   Creatinine, Urine  Date Value Ref Range Status  11/01/2015 82 20 - 320 mg/dL Final         Failed - Last BP in normal range    BP Readings from Last 1 Encounters:  10/01/21 (!) 160/79         Passed - AST in normal range and within 360 days    AST  Date Value Ref Range Status  10/01/2021 21 0 - 40 IU/L Final         Passed - ALT in normal range and within 360 days    ALT  Date Value Ref Range Status  10/01/2021 18 0 - 32 IU/L Final         Passed - Last Heart Rate in normal range    Pulse Readings from Last 1 Encounters:  10/01/21 67         Passed - Valid encounter within last 6 months    Recent Outpatient Visits          2 months ago Type 2 diabetes mellitus with diabetic polyneuropathy, without long-term current use of insulin (HCC)   [place], [place], MD   5 months ago Type 2 diabetes mellitus with diabetic polyneuropathy, without long-term current use of insulin (HCC)   [place], [place], MD   10 months ago Type 2 diabetes mellitus with diabetic polyneuropathy, without long-term current use of insulin ([place])   [place], [place], MD   1 year ago Type 2 diabetes mellitus with diabetic polyneuropathy, without long-term current use of insulin ([place])   [place], Charlane Ferretti, MD   1 year ago Tobacco use    [place], Charlane Ferretti, MD      Future Appointments            In 4 weeks Charlott Rakes, MD [place]

## 2021-12-02 NOTE — Telephone Encounter (Signed)
Copied from [place] [phone]. Topic: Quick Communication - Rx Refill/Question >> Dec 02, 2021  1:55 PM Leward Quan A wrote: Medication: acetaminophen-codeine (TYLENOL #3) 300-30 MG tablet  Per patient last Rx she only received 20 tabs and her leg is in lots of pain  Has the patient contacted their pharmacy? Yes.  Need new Rx  (Agent: If no, request that the patient contact the pharmacy for the refill. If patient does not wish to contact the pharmacy document the reason why and proceed with request.) (Agent: If yes, when and what did the pharmacy advise?)  Preferred Pharmacy (with phone number or street name): [id number] - [place], [place] - [address] AT [place]  Phone:  [phone] Fax:  [phone]    Has the patient been seen for an appointment in the last year OR does the patient have an upcoming appointment? Yes.    Agent: Please be advised that RX refills may take up to 3 business days. We ask that you follow-up with your pharmacy.

## 2021-12-02 NOTE — Telephone Encounter (Signed)
°  Chief Complaint: medication question Symptoms: amlodipine increased and pt experiences neck pain Frequency: several weeks Pertinent Negatives: NA Disposition: [] ED /[] Urgent Care (no appt availability in office) / [x] Appointment(In office/virtual)/ []  [facility]/ [] Home Care/ [] Refused Recommended Disposition /[] [facility]/ []  Follow-up with PCP Additional Notes: When asked if she wanted to come in sooner for appt pt wanted to keep 12/30/21 appt but schedule appt for labs. And pt is also asking for refill on tylenol codeine d/t she states it really helps with pain.    Reason for Disposition  [1] Caller has NON-URGENT medicine question about med that PCP prescribed AND [2] triager unable to answer question  Answer Assessment - Initial Assessment Questions 1. NAME of MEDICATION: "What medicine are you calling about?"     amlodipine 2. QUESTION: "What is your question?" (e.g., double dose of medicine, side effect)     Since increased has sharp pain on L side of neck  3. PRESCRIBING HCP: "Who prescribed it?" Reason: if prescribed by specialist, call should be referred to that group.     Dr. Margarita Rana  4. SYMPTOMS: "Do you have any symptoms?"     Since increased has sharp pain on L side of neck  5. SEVERITY: If symptoms are present, ask "Are they mild, moderate or severe?"     mild  Protocols used: Medication Question Call-A-AH

## 2021-12-02 NOTE — Telephone Encounter (Signed)
Requested medication (s) are due for refill today: yes  Requested medication (s) are on the active medication list: yes  Last refill:  09/20/21 #60/0  Future visit scheduled: yes  Notes to clinic:  Unable to refill per protocol, cannot delegate.      Requested Prescriptions  Pending Prescriptions Disp Refills   acetaminophen-codeine (TYLENOL #3) 300-30 MG tablet 60 tablet 0    Sig: Take 1 tablet by mouth every 12 (twelve) hours as needed for moderate pain.     Not Delegated - Analgesics:  Opioid Agonist Combinations 2 Failed - 12/02/2021  2:22 PM      Failed - This refill cannot be delegated      Failed - Cr in normal range and within 360 days    Creat  Date Value Ref Range Status  11/01/2015 1.20 (H) 0.50 - 0.99 mg/dL Final   Creatinine, Ser  Date Value Ref Range Status  10/01/2021 1.10 (H) 0.57 - 1.00 mg/dL Final   Creatinine, Urine  Date Value Ref Range Status  11/01/2015 82 20 - 320 mg/dL Final          Failed - Urine Drug Screen completed in last 360 days      Passed - eGFR is 10 or above and within 360 days    GFR, Est African American  Date Value Ref Range Status  01/11/2014 66 mL/min Final   GFR calc Af Amer  Date Value Ref Range Status  02/21/2020 55 (L) >59 mL/min/1.73 Final    Comment:    **[facility] currently reports eGFR in compliance with the current**   recommendations of the [employer]. [facility] will   update reporting as new guidelines are published from the NKF-ASN   Task force.    GFR, Est Non African American  Date Value Ref Range Status  01/11/2014 57 (L) mL/min Final    Comment:      The estimated GFR is a calculation valid for adults (>=75 years old) that uses the CKD-EPI algorithm to adjust for age and sex. It is   not to be used for children, pregnant women, hospitalized patients,    patients on dialysis, or with rapidly changing kidney function. According to the NKDEP, eGFR >89 is normal, 60-89 shows mild impairment,  30-59 shows moderate impairment, 15-29 shows severe impairment and <15 is ESRD.     GFR, Estimated  Date Value Ref Range Status  02/28/2021 58 (L) >60 mL/min Final    Comment:    (NOTE) Calculated using the CKD-EPI Creatinine Equation (2021)    eGFR  Date Value Ref Range Status  10/01/2021 54 (L) >59 mL/min/1.73 Final          Passed - Patient is not pregnant      Passed - Valid encounter within last 3 months    Recent Outpatient Visits           2 months ago Type 2 diabetes mellitus with diabetic polyneuropathy, without long-term current use of insulin ([place])   [place], [place], MD   5 months ago Type 2 diabetes mellitus with diabetic polyneuropathy, without long-term current use of insulin ([place])   [place], [place], MD   10 months ago Type 2 diabetes mellitus with diabetic polyneuropathy, without long-term current use of insulin (HCC)   [place], Charlane Ferretti, MD   1 year ago Type 2 diabetes mellitus with diabetic polyneuropathy, without long-term  current use of insulin ([place])   [place], [place], MD   1 year ago Tobacco use   [place], MD       Future Appointments             In 4 weeks Charlott Rakes, MD [place]

## 2021-12-04 ENCOUNTER — Other Ambulatory Visit: Payer: Self-pay | Admitting: Family Medicine

## 2021-12-04 DIAGNOSIS — E1142 Type 2 diabetes mellitus with diabetic polyneuropathy: Secondary | ICD-10-CM

## 2021-12-04 DIAGNOSIS — M1712 Unilateral primary osteoarthritis, left knee: Secondary | ICD-10-CM

## 2021-12-04 MED ORDER — ACETAMINOPHEN-CODEINE #3 300-30 MG PO TABS: 1.0000 | ORAL_TABLET | Freq: Two times a day (BID) | ORAL | 0 refills | Status: DC | PRN

## 2021-12-04 NOTE — Telephone Encounter (Signed)
Requested Prescriptions  Pending Prescriptions Disp Refills   atorvastatin (LIPITOR) 10 MG tablet [Pharmacy Med Name: ATORVASTATIN 10MG  TABLETS] 90 tablet 3    Sig: TAKE 1 TABLET(10 MG) BY MOUTH DAILY     Cardiovascular:  Antilipid - Statins Failed - 12/04/2021  5:26 PM      Failed - Lipid Panel in normal range within the last 12 months    Cholesterol, Total  Date Value Ref Range Status  10/01/2021 130 100 - 199 mg/dL Final   LDL Chol Calc (NIH)  Date Value Ref Range Status  10/01/2021 60 0 - 99 mg/dL Final   HDL  Date Value Ref Range Status  10/01/2021 55 >39 mg/dL Final   Triglycerides  Date Value Ref Range Status  10/01/2021 73 0 - 149 mg/dL Final         Passed - Patient is not pregnant      Passed - Valid encounter within last 12 months    Recent Outpatient Visits          2 months ago Type 2 diabetes mellitus with diabetic polyneuropathy, without long-term current use of insulin (HCC)   [place], [place], MD   5 months ago Type 2 diabetes mellitus with diabetic polyneuropathy, without long-term current use of insulin (HCC)   [place], [place], MD   10 months ago Type 2 diabetes mellitus with diabetic polyneuropathy, without long-term current use of insulin (HCC)   [place], [place], MD   1 year ago Type 2 diabetes mellitus with diabetic polyneuropathy, without long-term current use of insulin ([place])   [place], Charlane Ferretti, MD   1 year ago Tobacco use   [place], Charlane Ferretti, MD      Future Appointments            In 3 weeks Charlott Rakes, MD [place]

## 2021-12-22 ENCOUNTER — Other Ambulatory Visit: Payer: Self-pay | Admitting: Family Medicine

## 2021-12-22 DIAGNOSIS — E1142 Type 2 diabetes mellitus with diabetic polyneuropathy: Secondary | ICD-10-CM

## 2021-12-27 ENCOUNTER — Other Ambulatory Visit: Payer: BC Managed Care – PPO

## 2021-12-30 ENCOUNTER — Ambulatory Visit: Payer: BC Managed Care – PPO | Admitting: Family Medicine

## 2022-01-07 DIAGNOSIS — D2371 Other benign neoplasm of skin of right lower limb, including hip: Secondary | ICD-10-CM | POA: Diagnosis not present

## 2022-01-09 DIAGNOSIS — I739 Peripheral vascular disease, unspecified: Secondary | ICD-10-CM | POA: Diagnosis not present

## 2022-01-10 ENCOUNTER — Other Ambulatory Visit: Payer: Self-pay | Admitting: Family Medicine

## 2022-01-10 DIAGNOSIS — M1712 Unilateral primary osteoarthritis, left knee: Secondary | ICD-10-CM

## 2022-01-10 NOTE — Telephone Encounter (Signed)
Medication Refill - Medication: acetaminophen-codeine (TYLENOL #3) 300-30 MG tablet ? ?Has the patient contacted their pharmacy? No. ?(She said she wants to dr to call the pharmacy. ? ?Preferred Pharmacy (with phone number or street name): [id number] - [place], [place] - [address] AT [place] ?Has the patient been seen for an appointment in the last year OR does the patient have an upcoming appointment? Yes.   ? ?Agent: Please be advised that RX refills may take up to 3 business days. We ask that you follow-up with your pharmacy. ? ?

## 2022-01-11 ENCOUNTER — Other Ambulatory Visit: Payer: Self-pay | Admitting: Family Medicine

## 2022-01-11 DIAGNOSIS — M1712 Unilateral primary osteoarthritis, left knee: Secondary | ICD-10-CM

## 2022-01-13 ENCOUNTER — Other Ambulatory Visit: Payer: Self-pay

## 2022-01-13 DIAGNOSIS — M1712 Unilateral primary osteoarthritis, left knee: Secondary | ICD-10-CM

## 2022-01-13 NOTE — Telephone Encounter (Signed)
Requested medications are due for refill today.  yes ? ?Requested medications are on the active medications list.  yes ? ?Last refill. 12/04/2021 #60 0 refills ? ?Future visit scheduled.   no ? ?Notes to clinic.  Medication refill is not delegated. ? ? ? ?Requested Prescriptions  ?Pending Prescriptions Disp Refills  ? acetaminophen-codeine (TYLENOL #3) 300-30 MG tablet 60 tablet 0  ?  Sig: Take 1 tablet by mouth every 12 (twelve) hours as needed for moderate pain.  ?  ? Not Delegated - Analgesics:  Opioid Agonist Combinations 2 Failed - 01/10/2022  7:32 PM  ?  ?  Failed - This refill cannot be delegated  ?  ?  Failed - Cr in normal range and within 360 days  ?  Creat  ?Date Value Ref Range Status  ?11/01/2015 1.20 (H) 0.50 - 0.99 mg/dL Final  ? ?Creatinine, Ser  ?Date Value Ref Range Status  ?10/01/2021 1.10 (H) 0.57 - 1.00 mg/dL Final  ? ?Creatinine, Urine  ?Date Value Ref Range Status  ?11/01/2015 82 20 - 320 mg/dL Final  ?  ?  ?  ?  Failed - Urine Drug Screen completed in last 360 days  ?  ?  Failed - Valid encounter within last 3 months  ?  Recent Outpatient Visits   ? ?      ? 3 months ago Type 2 diabetes mellitus with diabetic polyneuropathy, without long-term current use of insulin ([place])  ? [place], Charlane Ferretti, MD  ? 7 months ago Type 2 diabetes mellitus with diabetic polyneuropathy, without long-term current use of insulin ([place])  ? [place], Charlane Ferretti, MD  ? 11 months ago Type 2 diabetes mellitus with diabetic polyneuropathy, without long-term current use of insulin ([place])  ? [facility] [place], Charlane Ferretti, MD  ? 1 year ago Type 2 diabetes mellitus with diabetic polyneuropathy, without long-term current use of insulin ([place])  ? [place], Charlane Ferretti, MD  ? 2 years ago Tobacco use  ? [place], Charlane Ferretti, MD  ? ?  ?  ? ?  ?  ?  Passed -  eGFR is 10 or above and within 360 days  ?  GFR, Est African American  ?Date Value Ref Range Status  ?01/11/2014 66 mL/min Final  ? ?GFR calc Af Amer  ?Date Value Ref Range Status  ?02/21/2020 55 (L) >59 mL/min/1.73 Final  ?  Comment:  ?  **[facility] currently reports eGFR in compliance with the current** ?  recommendations of the [employer]. [facility] will ?  update reporting as new guidelines are published from the NKF-ASN ?  Task force. ?  ? ?GFR, Est Non African American  ?Date Value Ref Range Status  ?01/11/2014 57 (L) mL/min Final  ?  Comment:  ?    ?The estimated GFR is a calculation valid for adults (>=71 years old) ?that uses the CKD-EPI algorithm to adjust for age and sex. It is   ?not to be used for children, pregnant women, hospitalized patients,    ?patients on dialysis, or with rapidly changing kidney function. ?According to the NKDEP, eGFR >89 is normal, 60-89 shows mild ?impairment, 30-59 shows moderate impairment, 15-29 shows severe ?impairment and <15 is ESRD. ?   ? ?GFR, Estimated  ?Date Value Ref Range Status  ?02/28/2021 58 (L) >60 mL/min Final  ?  Comment:  ?  (  NOTE) ?Calculated using the CKD-EPI Creatinine Equation (2021) ?  ? ?eGFR  ?Date Value Ref Range Status  ?10/01/2021 54 (L) >59 mL/min/1.73 Final  ?  ?  ?  ?  Passed - Patient is not pregnant  ?  ?  ?  ?

## 2022-01-13 NOTE — Telephone Encounter (Signed)
Pt is requesting refill sent to [facility] in [place]. ?

## 2022-01-13 NOTE — Telephone Encounter (Signed)
Requested medication (s) are due for refill today: Yes ? ?Requested medication (s) are on the active medication list: Yes ? ?Last refill:  12/04/21 ? ?Future visit scheduled: No ? ?Notes to clinic:  Unable to refill per protocol, cannot delegate. ? ? ? ? ? ?Requested Prescriptions  ?Pending Prescriptions Disp Refills  ? acetaminophen-codeine (TYLENOL #3) 300-30 MG tablet [Pharmacy Med Name: ACETAMINOPHEN/COD #3 (300/30MG) TAB] 60 tablet   ?  Sig: TAKE 1 TABLET BY MOUTH EVERY 12 HOURS AS NEEDED FOR MODERATE PAIN  ?  ? Not Delegated - Analgesics:  Opioid Agonist Combinations 2 Failed - 01/11/2022 10:46 AM  ?  ?  Failed - This refill cannot be delegated  ?  ?  Failed - Cr in normal range and within 360 days  ?  Creat  ?Date Value Ref Range Status  ?11/01/2015 1.20 (H) 0.50 - 0.99 mg/dL Final  ? ?Creatinine, Ser  ?Date Value Ref Range Status  ?10/01/2021 1.10 (H) 0.57 - 1.00 mg/dL Final  ? ?Creatinine, Urine  ?Date Value Ref Range Status  ?11/01/2015 82 20 - 320 mg/dL Final  ?  ?  ?  ?  Failed - Urine Drug Screen completed in last 360 days  ?  ?  Failed - Valid encounter within last 3 months  ?  Recent Outpatient Visits   ? ?      ? 3 months ago Type 2 diabetes mellitus with diabetic polyneuropathy, without long-term current use of insulin ([place])  ? [place], Charlane Ferretti, MD  ? 7 months ago Type 2 diabetes mellitus with diabetic polyneuropathy, without long-term current use of insulin ([place])  ? [place], Charlane Ferretti, MD  ? 11 months ago Type 2 diabetes mellitus with diabetic polyneuropathy, without long-term current use of insulin ([place])  ? [facility] [place], Charlane Ferretti, MD  ? 1 year ago Type 2 diabetes mellitus with diabetic polyneuropathy, without long-term current use of insulin ([place])  ? [place], Charlane Ferretti, MD  ? 2 years ago Tobacco use  ? [place], Charlane Ferretti, MD  ? ?  ?  ? ?  ?  ?  Passed - eGFR is 10 or above and within 360 days  ?  GFR, Est African American  ?Date Value Ref Range Status  ?01/11/2014 66 mL/min Final  ? ?GFR calc Af Amer  ?Date Value Ref Range Status  ?02/21/2020 55 (L) >59 mL/min/1.73 Final  ?  Comment:  ?  **[facility] currently reports eGFR in compliance with the current** ?  recommendations of the [employer]. [facility] will ?  update reporting as new guidelines are published from the NKF-ASN ?  Task force. ?  ? ?GFR, Est Non African American  ?Date Value Ref Range Status  ?01/11/2014 57 (L) mL/min Final  ?  Comment:  ?    ?The estimated GFR is a calculation valid for adults (>=48 years old) ?that uses the CKD-EPI algorithm to adjust for age and sex. It is   ?not to be used for children, pregnant women, hospitalized patients,    ?patients on dialysis, or with rapidly changing kidney function. ?According to the NKDEP, eGFR >89 is normal, 60-89 shows mild ?impairment, 30-59 shows moderate impairment, 15-29 shows severe ?impairment and <15 is ESRD. ?   ? ?GFR, Estimated  ?Date Value Ref Range Status  ?02/28/2021 58 (L) >60 mL/min Final  ?  Comment:  ?  (NOTE) ?Calculated using the CKD-EPI Creatinine Equation (2021) ?  ? ?eGFR  ?Date Value Ref Range Status  ?10/01/2021 54 (L) >59 mL/min/1.73 Final  ?  ?  ?  ?  Passed - Patient is not pregnant  ?  ?  ? ? ? ? ?

## 2022-01-14 ENCOUNTER — Telehealth: Payer: Self-pay

## 2022-01-14 MED ORDER — ACETAMINOPHEN-CODEINE #3 300-30 MG PO TABS: 1.0000 | ORAL_TABLET | Freq: Two times a day (BID) | ORAL | 0 refills | Status: DC | PRN

## 2022-01-14 NOTE — Telephone Encounter (Signed)
Done

## 2022-01-14 NOTE — Telephone Encounter (Signed)
Pt is requesting refill on Tylenol #3 ?

## 2022-01-15 NOTE — Telephone Encounter (Signed)
Pt was called and informed of medication being ready for pick up. ?
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
## 2022-01-25 ENCOUNTER — Other Ambulatory Visit: Payer: Self-pay | Admitting: Family Medicine

## 2022-01-25 DIAGNOSIS — E1159 Type 2 diabetes mellitus with other circulatory complications: Secondary | ICD-10-CM

## 2022-01-29 ENCOUNTER — Ambulatory Visit (INDEPENDENT_AMBULATORY_CARE_PROVIDER_SITE_OTHER)
Admission: RE | Admit: 2022-01-29 | Discharge: 2022-01-29 | Disposition: A | Discharge status: Home / Self Care | Payer: BC Managed Care – PPO | Source: Ambulatory Visit | Attending: Vascular Surgery | Admitting: Vascular Surgery

## 2022-01-29 ENCOUNTER — Ambulatory Visit (HOSPITAL_COMMUNITY)
Admission: RE | Admit: 2022-01-29 | Discharge: 2022-01-29 | Disposition: A | Discharge status: Home / Self Care | Payer: BC Managed Care – PPO | Source: Ambulatory Visit | Attending: Vascular Surgery | Admitting: Vascular Surgery

## 2022-01-29 ENCOUNTER — Ambulatory Visit (INDEPENDENT_AMBULATORY_CARE_PROVIDER_SITE_OTHER): Payer: BC Managed Care – PPO | Admitting: Physician Assistant

## 2022-01-29 VITALS — BP 173/92 | HR 70 | Temp 97.3°F | Ht 66.0 in | Wt 157.5 lb

## 2022-01-29 DIAGNOSIS — I739 Peripheral vascular disease, unspecified: Secondary | ICD-10-CM | POA: Diagnosis not present

## 2022-02-03 ENCOUNTER — Other Ambulatory Visit: Payer: Self-pay | Admitting: *Deleted

## 2022-02-03 DIAGNOSIS — I739 Peripheral vascular disease, unspecified: Secondary | ICD-10-CM

## 2022-02-11 ENCOUNTER — Ambulatory Visit: Payer: BC Managed Care – PPO | Admitting: Orthopaedic Surgery

## 2022-02-11 DIAGNOSIS — D2371 Other benign neoplasm of skin of right lower limb, including hip: Secondary | ICD-10-CM | POA: Diagnosis not present

## 2022-02-12 ENCOUNTER — Ambulatory Visit (INDEPENDENT_AMBULATORY_CARE_PROVIDER_SITE_OTHER): Payer: BC Managed Care – PPO | Admitting: Orthopaedic Surgery

## 2022-02-12 ENCOUNTER — Ambulatory Visit (INDEPENDENT_AMBULATORY_CARE_PROVIDER_SITE_OTHER): Payer: BC Managed Care – PPO

## 2022-02-12 DIAGNOSIS — G8929 Other chronic pain: Secondary | ICD-10-CM | POA: Diagnosis not present

## 2022-02-12 DIAGNOSIS — M25562 Pain in left knee: Secondary | ICD-10-CM | POA: Diagnosis not present

## 2022-02-12 DIAGNOSIS — M1712 Unilateral primary osteoarthritis, left knee: Secondary | ICD-10-CM | POA: Diagnosis not present

## 2022-02-12 MED ORDER — METHYLPREDNISOLONE ACETATE 40 MG/ML IJ SUSP
40.0000 mg | INTRAMUSCULAR | Status: AC | PRN
  Administered 2022-02-12: 40 mg via INTRA_ARTICULAR

## 2022-02-12 MED ORDER — LIDOCAINE HCL 1 % IJ SOLN
3.0000 mL | INTRAMUSCULAR | Status: AC | PRN
  Administered 2022-02-12: 3 mL

## 2022-02-12 NOTE — Progress Notes (Signed)
? ?Office Visit Note ?  ?Patient: Tammy Graham           ?Date of Birth: [birth year]           ?MRN: [id number] ?Visit Date: 02/12/2022 ?             ?Requested by: Charlott Rakes, MD ?[address] ?[address] ?[place],  [place] [address] ?PCP: Charlott Rakes, MD ? ? ?Assessment & Plan: ?Visit Diagnoses:  ?1. Chronic pain of left knee   ?2. Unilateral primary osteoarthritis, left knee   ? ? ?Plan: The patient has a significant deformity of her left knee and I showed her her x-rays.  I was able to aspirate about 40 cc of clear yellow fluid from the knee and did place a steroid injection in the knee.  She will continue work on blood glucose control.  She is interested in having knee replacement but maybe not until the fall.  I did show her knee replacement model and described the surgery involves.  All questions and concerns were answered and addressed.  I will see her back in 3 months to see how she is doing overall.  No x-rays are needed.  We can then work on scheduling her for knee replacement surgery in the fall.  Obviously if she is worsening and would like to have this done sooner she needs to give [name] a call. ? ?Follow-Up Instructions: Return in about 4 months (around 06/14/2022).  ? ?Orders:  ?Orders Placed This Encounter  ?Procedures  ? Large Joint Inj  ? XR Knee 1-2 Views Left  ? ?No orders of the defined types were placed in this encounter. ? ? ? ? Procedures: ?Large Joint Inj: L knee on 02/12/2022 1:45 PM ?Indications: diagnostic evaluation and pain ?Details: 22 G 1.5 in needle, superolateral approach ? ?Arthrogram: No ? ?Medications: 3 mL lidocaine 1 %; 40 mg methylPREDNISolone acetate 40 MG/ML ?Outcome: tolerated well, no immediate complications ?Procedure, treatment alternatives, risks and benefits explained, specific risks discussed. Consent was given by the patient. Immediately prior to procedure a time out was called to verify the correct patient, procedure, equipment, support staff and site/side  marked as required. Patient was prepped and draped in the usual sterile fashion.  ? ? ? ? ?Clinical Data: ?No additional findings. ? ? ?Subjective: ?Chief Complaint  ?Patient presents with  ? Left Knee - Pain  ?The patient is a 71 year old female who comes in for evaluation treatment of severe knee pain for many years now.  After talking with her it sounds like she had an arthroscopic intervention when I believe she was in [place] years ago and she was told that there was nothing they could do for her knee other than knee replacement surgery.  In the interim she has had a heart attack and does see Dr. Angelena Form from cardiology.  She is not on blood thinning medications.  She is a diabetic but says she feels like she is under pretty good control.  The last hemoglobin A1c also was 7.2.  She also sees Dr. Kasandra Knudsen with vascular surgery due to peripheral vascular disease.  Her left knee pain is 10 out of 10 and it is daily.  She reports knee swelling.  At this point it is detrimentally affecting her mobility, her quality of life and her actives day living. ? ?HPI ? ?Review of Systems ?She currently denies any headache, chest pain, shortness of breath, fever, chills, nausea, vomiting ? ?Objective: ?Vital Signs: There were  no vitals taken for this visit. ? ?Physical Exam ?She is alert and oriented x3 and in no acute distress.  However she walks with a significant limp and obvious deformity of her left knee. ?Ortho Exam ?Examination of her left knee shows significant varus malalignment.  There is an effusion.  There is patellofemoral cavitation throughout the arc of motion of her knee and global tenderness of all 3 compartments. ?Specialty Comments:  ?No specialty comments available. ? ?Imaging: ?XR Knee 1-2 Views Left ? ?Result Date: 02/12/2022 ?2 views of the left knee shows severe tricompartment arthritis with varus malalignment, bone-on-bone wear of the medial and patellofemoral compartments and osteophytes in all 3  compartments which are quite significant.  ? ? ?PMFS History: ?Patient Active Problem List  ? Diagnosis Date Noted  ? Unilateral primary osteoarthritis, left knee 02/12/2022  ? PAD (peripheral artery disease) ([place]) 02/27/2021  ? Pain due to onychomycosis of toenails of both feet 07/05/2019  ? Right flank pain 07/01/2019  ? Dysuria 01/28/2017  ? Vaginal itching 01/24/2016  ? Midline low back pain without sciatica 01/24/2016  ? Type 2 diabetes mellitus without complication, without long-term current use of insulin ([place]) 12/06/2015  ? Pap smear for cervical cancer screening 12/06/2015  ? Dyslipidemia 11/29/2015  ? Cough 11/01/2015  ? Hip pain 02/08/2015  ? Essential hypertension 03/22/2014  ? Mixed hyperlipidemia 03/22/2014  ? Tobacco use 03/22/2014  ? Type 2 diabetes mellitus ([place]) 03/22/2014  ? Anxiety 03/22/2014  ? CAD (coronary artery disease) 05/06/2011  ? ?Past Medical History:  ?Diagnosis Date  ? Anxiety   ? Arthritis   ? shoulder and knees  ? CAD (coronary artery disease)   ? Complication of anesthesia   ? states she was still awake one time when surgery started  ? DM (diabetes mellitus) ([place])   ? HTN (hypertension)   ? Hyperlipemia   ? Myocardial infarction [facility])   ? Peripheral vascular disease ([place])   ? Pneumonia   ? Sleep apnea   ? no cpap  ? Stroke [facility])   ? 2012  ?  ?Family History  ?Problem Relation Age of Onset  ? Heart attack Mother   ? Anesthesia problems Neg Hx   ? Hypotension Neg Hx   ? Malignant hyperthermia Neg Hx   ? Pseudochol deficiency Neg Hx   ?  ?Past Surgical History:  ?Procedure Laterality Date  ? ABDOMINAL AORTOGRAM W/LOWER EXTREMITY Bilateral 02/11/2021  ? Procedure: ABDOMINAL AORTOGRAM W/LOWER EXTREMITY;  Surgeon: Waynetta Sandy, MD;  Location: [place] [facility];  Service: Cardiovascular;  Laterality: Bilateral;  ? CARDIAC CATHETERIZATION    ? COLONOSCOPY    ? COLONOSCOPY  09/25/2011  ? Procedure: COLONOSCOPY;  Surgeon: Winfield Cunas., MD;  Location: [facility];   Service: Endoscopy;  Laterality: N/A;  ? CORONARY STENT PLACEMENT    ? Ostial LAD stent in 2012  ? FEMORAL-POPLITEAL BYPASS GRAFT Right 02/27/2021  ? Procedure: RIGHT COMMON FEMORAL- ABOVE KNEE POPLITEAL ARTERY BYPASS GRAFT;  Surgeon: Waynetta Sandy, MD;  Location: [place];  Service: Vascular;  Laterality: Right;  ? Left knee surgery    ? ?Social History  ? ?Occupational History  ? Not on file  ?Tobacco Use  ? Smoking status: Former  ?  Packs/day: 0.25  ?  Years: 15.00  ?  Pack years: 3.75  ?  Types: Cigarettes  ?  Quit date: 02/27/2021  ?  Years since quitting: 0.9  ? Smokeless tobacco: Never  ? Tobacco comments:  ?  4-5 daily  ?Vaping Use  ? Vaping Use: Never used  ?Substance and Sexual Activity  ? Alcohol use: Yes  ?  Alcohol/week: 1.0 standard drink  ?  Types: 1 Shots of liquor per week  ?  Comment: occasionally   ? Drug use: No  ? Sexual activity: Not Currently  ?  Birth control/protection: None  ? ? ? ? ? ? ?

## 2022-02-14 ENCOUNTER — Telehealth: Payer: Self-pay | Admitting: *Deleted

## 2022-02-14 NOTE — Telephone Encounter (Signed)
Received copy of LEA duplex report from 01/09/22 which was done at [facility] in [facility] with a note attached that the patient wants Dr. Angelena Form to see this report and call her to discuss.  I called the patient, left her a message to call back to schedule an appointment.  She was due for her annual visit in 11/2021.  I will have this document scanned into her chart.  The patient follows with [facility] and was seen there on 01/29/22.   ?

## 2022-02-17 NOTE — Telephone Encounter (Signed)
LEA duplex report given to Dr. Angelena Form who reviewed and is aware I have called the patient and left her a message to schedule overdue follow up in cardiology. ?

## 2022-03-04 ENCOUNTER — Encounter: Payer: Self-pay | Admitting: Cardiovascular Disease

## 2022-03-04 ENCOUNTER — Ambulatory Visit (INDEPENDENT_AMBULATORY_CARE_PROVIDER_SITE_OTHER): Payer: BC Managed Care – PPO | Admitting: Cardiovascular Disease

## 2022-03-04 VITALS — BP 118/66 | HR 64 | Ht 66.0 in | Wt 157.2 lb

## 2022-03-04 DIAGNOSIS — I739 Peripheral vascular disease, unspecified: Secondary | ICD-10-CM

## 2022-03-04 DIAGNOSIS — I2510 Atherosclerotic heart disease of native coronary artery without angina pectoris: Secondary | ICD-10-CM | POA: Diagnosis not present

## 2022-03-04 DIAGNOSIS — I10 Essential (primary) hypertension: Secondary | ICD-10-CM | POA: Diagnosis not present

## 2022-03-04 MED ORDER — NICOTINE 14 MG/24HR TD PT24: 14.0000 mg | MEDICATED_PATCH | Freq: Every day | TRANSDERMAL | 0 refills | Status: DC

## 2022-03-04 NOTE — Progress Notes (Signed)
? ?Chief Complaint  ?Patient presents with  ? Follow-up  ?  CAD  ? ?History of Present Illness: 71 yo female with history of CAD, PAD, DM, HTN, HLD, tobacco abuse, anxiety and sleep apnea who is here today for cardiac follow up. She had a NSTEMI in 2012 and I placed a drug eluting stent in the ostium of her LAD. Mild disease noted in the RCA at that time. LV systolic function was normal. She was lost to follow up in our office since 2012 but she did see Dr. Verl Blalock in the [facility] in 2015. I saw her back in the office in 2019 and she was doing well.  She had a tingling in her right foot around Christmas and was seen in podiatry. She had poor right pedal pulses and was seen by Dr. Oletha Blend in [place], [place] (IR/Vascular medicine). Non-invasive studies with severe disease in the right lower extremity. Dr. Carlton Adam performed LE angiography and she was found to have total occlusion of the right superficial femoral artery 11/18/20. He could not cross the lesion from an antegrade approach and went to pedal access. He was unable to cross the occlusion. It looks like angioplasty was performed to facilitate advancing the wire. No stent was placed. She was then seen in [facility] by Dr. Donzetta Matters and is now s/p right SFA to popliteal bypass in may 2022. Nuclear stress test in may 2022 with no ischemia.  ? ?She is here today for follow up. The patient denies any chest pain, dyspnea, palpitations, lower extremity edema, orthopnea, PND, dizziness, near syncope or syncope.  ?  ?Primary Care Physician: Charlott Rakes, MD ? ? ?Past Medical History:  ?Diagnosis Date  ? Anxiety   ? Arthritis   ? shoulder and knees  ? CAD (coronary artery disease)   ? Complication of anesthesia   ? states she was still awake one time when surgery started  ? DM (diabetes mellitus) ([place])   ? HTN (hypertension)   ? Hyperlipemia   ? Myocardial infarction [facility])   ? Peripheral vascular disease ([place])   ? Pneumonia   ? Sleep apnea   ? no cpap  ?  Stroke [facility])   ? 2012  ? ? ?Past Surgical History:  ?Procedure Laterality Date  ? ABDOMINAL AORTOGRAM W/LOWER EXTREMITY Bilateral 02/11/2021  ? Procedure: ABDOMINAL AORTOGRAM W/LOWER EXTREMITY;  Surgeon: Waynetta Sandy, MD;  Location: [place] [facility];  Service: Cardiovascular;  Laterality: Bilateral;  ? CARDIAC CATHETERIZATION    ? COLONOSCOPY    ? COLONOSCOPY  09/25/2011  ? Procedure: COLONOSCOPY;  Surgeon: Winfield Cunas., MD;  Location: [facility];  Service: Endoscopy;  Laterality: N/A;  ? CORONARY STENT PLACEMENT    ? Ostial LAD stent in 2012  ? FEMORAL-POPLITEAL BYPASS GRAFT Right 02/27/2021  ? Procedure: RIGHT COMMON FEMORAL- ABOVE KNEE POPLITEAL ARTERY BYPASS GRAFT;  Surgeon: Waynetta Sandy, MD;  Location: [place];  Service: Vascular;  Laterality: Right;  ? Left knee surgery    ? ? ?Current Outpatient Medications  ?Medication Sig Dispense Refill  ? nicotine (NICODERM CQ - DOSED IN MG/24 HOURS) 14 mg/24hr patch Place 1 patch (14 mg total) onto the skin daily. 28 patch 0  ? ACCU-CHEK AVIVA PLUS test strip USE AS INSTRUCTED 100 strip 0  ? Accu-Chek FastClix Lancets MISC USE AS INSTRUCTED 102 each 11  ? Accu-Chek FastClix Lancets MISC Use daily as directed 102 each 3  ? ACCU-CHEK GUIDE test strip  USE AS INSTRUCTED 100 strip 1  ? acetaminophen-codeine (TYLENOL #3) 300-30 MG tablet Take 1 tablet by mouth every 12 (twelve) hours as needed for moderate pain. 60 tablet 0  ? amLODipine (NORVASC) 5 MG tablet Take 1 tablet (5 mg total) by mouth daily. 90 tablet 1  ? aspirin EC 81 MG tablet Take 81 mg by mouth in the morning. Swallow whole.    ? atorvastatin (LIPITOR) 10 MG tablet TAKE 1 TABLET(10 MG) BY MOUTH DAILY 90 tablet 3  ? Blood Glucose Monitoring Suppl (ACCU-CHEK GUIDE ME) w/Device KIT 1 kit by Does not apply route daily. Use to check blood sugar daily. 1 kit 0  ? carboxymethylcellul-glycerin (LUBRICANT DROPS/DUAL-ACTION) 0.5-0.9 % ophthalmic solution Place 1-2 drops into both eyes 3  (three) times daily as needed for dry eyes.    ? carvedilol (COREG) 12.5 MG tablet TAKE 1 TABLET(12.5 MG) BY MOUTH TWICE DAILY WITH A MEAL 180 tablet 0  ? gabapentin (NEURONTIN) 300 MG capsule TAKE 1 CAPSULE(300 MG) BY MOUTH AT BEDTIME 90 capsule 0  ? glipiZIDE (GLUCOTROL) 10 MG tablet TAKE 1 TABLET BY MOUTH DAILY BEFORE BREAKFAST 90 tablet 1  ? glucose blood test strip Use as instructed 100 each 12  ? metFORMIN (GLUCOPHAGE) 1000 MG tablet TAKE 1 TABLET(1000 MG) BY MOUTH TWICE DAILY WITH A MEAL 180 tablet 0  ? Misc. Devices MISC Shower bench, Above the toilet seat.  Diagnosis- Type 2 Diabetes Mellitus 1 each 0  ? Multiple Vitamin (MULTIVITAMIN WITH MINERALS) TABS tablet Take 1 tablet by mouth in the morning.    ? nitroGLYCERIN (NITROSTAT) 0.4 MG SL tablet Place 0.4 mg under the tongue every 5 (five) minutes as needed for chest pain.    ? oxyCODONE-acetaminophen (PERCOCET/ROXICET) 5-325 MG tablet Take 1 tablet by mouth every 4 (four) hours as needed for moderate pain or severe pain. 30 tablet 0  ? PRESCRIPTION MEDICATION Apply 1 application topically in the morning and at bedtime. K[place]a Foot Cream    ? ?No current facility-administered medications for this visit.  ? ? ?Allergies  ?Allergen Reactions  ? Benadryl [Diphenhydramine Hcl (Sleep)] Other (See Comments)  ?  unknown  ? Rosuvastatin Hives  ?  Patient is allergic to generic rosuvastatin. She says she is able to take branded Crestor.   ? Simvastatin Other (See Comments)  ?  unknown  ? ? ?Social History  ? ?Socioeconomic History  ? Marital status: Widowed  ?  Spouse name: Not on file  ? Number of children: Not on file  ? Years of education: Not on file  ? Highest education level: Not on file  ?Occupational History  ? Not on file  ?Tobacco Use  ? Smoking status: Former  ?  Packs/day: 0.25  ?  Years: 15.00  ?  Pack years: 3.75  ?  Types: Cigarettes  ?  Quit date: 02/27/2021  ?  Years since quitting: 1.0  ? Smokeless tobacco: Never  ? Tobacco comments:  ?  4-5 daily   ?Vaping Use  ? Vaping Use: Never used  ?Substance and Sexual Activity  ? Alcohol use: Yes  ?  Alcohol/week: 1.0 standard drink  ?  Types: 1 Shots of liquor per week  ?  Comment: occasionally   ? Drug use: No  ? Sexual activity: Not Currently  ?  Birth control/protection: None  ?Other Topics Concern  ? Not on file  ?Social History Narrative  ? Not on file  ? ?Social Determinants of Health  ? ?Financial  Resource Strain: Not on file  ?Food Insecurity: Not on file  ?Transportation Needs: Not on file  ?Physical Activity: Not on file  ?Stress: Not on file  ?Social Connections: Not on file  ?Intimate Partner Violence: Not on file  ? ? ?Family History  ?Problem Relation Age of Onset  ? Heart attack Mother   ? Anesthesia problems Neg Hx   ? Hypotension Neg Hx   ? Malignant hyperthermia Neg Hx   ? Pseudochol deficiency Neg Hx   ? ? ?Review of Systems:  As stated in the HPI and otherwise negative.  ? ?BP 118/66   Pulse 64   Ht _0  (1.676 m)   Wt 157 lb 3.2 oz (71.3 kg)   SpO2 95%   BMI 25.37 kg/m?  ? ?Physical Examination: ?General: Well developed, well nourished, NAD  ?HEENT: OP clear, mucus membranes moist  ?SKIN: warm, dry. No rashes. ?Neuro: No focal deficits  ?Musculoskeletal: Muscle strength 5/5 all ext  ?Psychiatric: Mood and affect normal  ?Neck: No JVD, no carotid bruits, no thyromegaly, no lymphadenopathy.  ?Lungs:Clear bilaterally, no wheezes, rhonci, crackles ?Cardiovascular: Regular rate and rhythm. No murmurs, gallops or rubs. ?Abdomen:Soft. Bowel sounds present. Non-tender.  ?Extremities: No lower extremity edema. Pulses are 2 + in the bilateral DP/PT. ? ?EKG:  EKG is ordered today. ?The ekg ordered today demonstrates NSR ? ?Recent Labs: ?10/01/2021: ALT 18; BUN 12; Creatinine, Ser 1.10; Potassium 4.3; Sodium 143  ? ?Lipid Panel ?   ?Component Value Date/Time  ? CHOL 130 10/01/2021 1035  ? TRIG 73 10/01/2021 1035  ? HDL 55 10/01/2021 1035  ? CHOLHDL 2.2 02/28/2021 0155  ? VLDL 12 02/28/2021 0155  ?  [place] 60 10/01/2021 1035  ? ?  ?Wt Readings from Last 3 Encounters:  ?03/04/22 157 lb 3.2 oz (71.3 kg)  ?01/29/22 157 lb 8 oz (71.4 kg)  ?10/01/21 161 lb 12.8 oz (73.4 kg)  ?  ?Assessment and Plan:  ? ?1. CAD wi

## 2022-03-04 NOTE — Patient Instructions (Signed)
Medication Instructions:  No changes *If you need a refill on your cardiac medications before your next appointment, please call your pharmacy*   Lab Work: none If you have labs (blood work) drawn today and your tests are completely normal, you will receive your results only by: MyChart Message (if you have MyChart) OR A paper copy in the mail If you have any lab test that is abnormal or we need to change your treatment, we will call you to review the results.   Testing/Procedures: none   Follow-Up: At [facility], you and your health needs are our priority.  As part of our continuing mission to provide you with exceptional heart care, we have created designated Provider Care Teams.  These Care Teams include your primary Cardiologist (physician) and Advanced Practice Providers (APPs -  Physician Assistants and Nurse Practitioners) who all work together to provide you with the care you need, when you need it.  We recommend signing up for the patient portal called "MyChart".  Sign up information is provided on this After Visit Summary.  MyChart is used to connect with patients for Virtual Visits (Telemedicine).  Patients are able to view lab/test results, encounter notes, upcoming appointments, etc.  Non-urgent messages can be sent to your provider as well.   To learn more about what you can do with MyChart, go to [email or web address].    Your next appointment:   12 month(s)  The format for your next appointment:   In Person  Provider:   Christopher McAlhany, MD     Other Instructions   Important Information About Sugar       

## 2022-03-10 ENCOUNTER — Telehealth: Payer: Self-pay

## 2022-03-10 ENCOUNTER — Other Ambulatory Visit: Payer: Self-pay | Admitting: Family Medicine

## 2022-03-10 DIAGNOSIS — M1712 Unilateral primary osteoarthritis, left knee: Secondary | ICD-10-CM

## 2022-03-10 NOTE — Telephone Encounter (Signed)
Patient is requesting refill on Tylenol #3 sent to [facility] in [place]

## 2022-03-11 ENCOUNTER — Other Ambulatory Visit: Payer: Self-pay | Admitting: Family Medicine

## 2022-03-11 DIAGNOSIS — I10 Essential (primary) hypertension: Secondary | ICD-10-CM

## 2022-03-11 MED ORDER — ACETAMINOPHEN-CODEINE 300-30 MG PO TABS: 1.0000 | ORAL_TABLET | Freq: Two times a day (BID) | ORAL | 0 refills | Status: DC | PRN

## 2022-03-11 NOTE — Telephone Encounter (Signed)
Requested medications are due for refill today.  no  Requested medications are on the active medications list.  yes  Last refill. 03/11/2022 #60 0 refills  Future visit scheduled.   no  Notes to clinic.  Medication refill and refusal are not delegated. Medication refilled today.    Requested Prescriptions  Pending Prescriptions Disp Refills   acetaminophen-codeine (TYLENOL #3) 300-30 MG tablet [Pharmacy Med Name: ACETAMINOPHEN/COD #3 (300/30MG) TAB] 60 tablet     Sig: TAKE 1 TABLET BY MOUTH EVERY 12 HOURS AS NEEDED FOR MODERATE PAIN     Not Delegated - Analgesics:  Opioid Agonist Combinations 2 Failed - 03/10/2022 10:16 AM      Failed - This refill cannot be delegated      Failed - Cr in normal range and within 360 days    Creat  Date Value Ref Range Status  11/01/2015 1.20 (H) 0.50 - 0.99 mg/dL Final   Creatinine, Ser  Date Value Ref Range Status  10/01/2021 1.10 (H) 0.57 - 1.00 mg/dL Final   Creatinine, Urine  Date Value Ref Range Status  11/01/2015 82 20 - 320 mg/dL Final         Failed - Urine Drug Screen completed in last 360 days      Failed - Valid encounter within last 3 months    Recent Outpatient Visits           5 months ago Type 2 diabetes mellitus with diabetic polyneuropathy, without long-term current use of insulin ([place])   [place], [place], MD   8 months ago Type 2 diabetes mellitus with diabetic polyneuropathy, without long-term current use of insulin ([place])   [place], [place], MD   1 year ago Type 2 diabetes mellitus with diabetic polyneuropathy, without long-term current use of insulin ([place])   [place], [place], MD   2 years ago Type 2 diabetes mellitus with diabetic polyneuropathy, without long-term current use of insulin ([place])   [place], Charlane Ferretti, MD   2 years ago Tobacco use   [place], Charlane Ferretti, MD       Future Appointments             In 2 months Mcarthur Rossetti, MD [place] - eGFR is 10 or above and within 360 days    GFR, Est African American  Date Value Ref Range Status  01/11/2014 66 mL/min Final   GFR calc Af Amer  Date Value Ref Range Status  02/21/2020 55 (L) >59 mL/min/1.73 Final    Comment:    **[facility] currently reports eGFR in compliance with the current**   recommendations of the [employer]. [facility] will   update reporting as new guidelines are published from the NKF-ASN   Task force.    GFR, Est Non African American  Date Value Ref Range Status  01/11/2014 57 (L) mL/min Final    Comment:      The estimated GFR is a calculation valid for adults (>=14 years old) that uses the CKD-EPI algorithm to adjust for age and sex. It is   not to be used for children, pregnant women, hospitalized patients,    patients on dialysis, or with rapidly changing kidney function. According to the NKDEP, eGFR >89 is normal, 60-89 shows  mild impairment, 30-59 shows moderate impairment, 15-29 shows severe impairment and <15 is ESRD.     GFR, Estimated  Date Value Ref Range Status  02/28/2021 58 (L) >60 mL/min Final    Comment:    (NOTE) Calculated using the CKD-EPI Creatinine Equation (2021)    eGFR  Date Value Ref Range Status  10/01/2021 54 (L) >59 mL/min/1.73 Final         Passed - Patient is not pregnant

## 2022-03-11 NOTE — Telephone Encounter (Signed)
Patient was called and a VM was left informing patient that medication has been sent to pharmacy.

## 2022-03-12 NOTE — Telephone Encounter (Signed)
Requested medications are due for refill today.  no  Requested medications are on the active medications list.  no  Last refill. 03/23/2021 #90 0 refills  Future visit scheduled.   no  Notes to clinic.  Medication was discontinued 06/17/2021    Requested Prescriptions  Pending Prescriptions Disp Refills   hydrochlorothiazide (HYDRODIURIL) 25 MG tablet [Pharmacy Med Name: HYDROCHLOROTHIAZIDE '25MG'$ TABLETS] 90 tablet 0    Sig: TAKE 1 TABLET(25 MG) BY MOUTH DAILY     Cardiovascular: Diuretics - Thiazide Failed - 03/11/2022  3:12 PM      Failed - Cr in normal range and within 180 days    Creat  Date Value Ref Range Status  11/01/2015 1.20 (H) 0.50 - 0.99 mg/dL Final   Creatinine, Ser  Date Value Ref Range Status  10/01/2021 1.10 (H) 0.57 - 1.00 mg/dL Final   Creatinine, Urine  Date Value Ref Range Status  11/01/2015 82 20 - 320 mg/dL Final         Passed - K in normal range and within 180 days    Potassium  Date Value Ref Range Status  10/01/2021 4.3 3.5 - 5.2 mmol/L Final         Passed - Na in normal range and within 180 days    Sodium  Date Value Ref Range Status  10/01/2021 143 134 - 144 mmol/L Final         Passed - Last BP in normal range    BP Readings from Last 1 Encounters:  03/04/22 118/66         Passed - Valid encounter within last 6 months    Recent Outpatient Visits           5 months ago Type 2 diabetes mellitus with diabetic polyneuropathy, without long-term current use of insulin (HCC)   [place], [place], MD   8 months ago Type 2 diabetes mellitus with diabetic polyneuropathy, without long-term current use of insulin ([place])   [place], [place], MD   1 year ago Type 2 diabetes mellitus with diabetic polyneuropathy, without long-term current use of insulin ([place])   [place], [place], MD   2 years ago Type 2 diabetes mellitus with  diabetic polyneuropathy, without long-term current use of insulin ([place])   [place], Charlane Ferretti, MD   2 years ago Tobacco use   [place], [place], MD       Future Appointments             In 2 months Ninfa Linden, Lind Guest, MD [place]

## 2022-03-13 DIAGNOSIS — D2371 Other benign neoplasm of skin of right lower limb, including hip: Secondary | ICD-10-CM | POA: Diagnosis not present

## 2022-03-14 ENCOUNTER — Other Ambulatory Visit: Payer: Self-pay | Admitting: Family Medicine

## 2022-03-14 DIAGNOSIS — I152 Hypertension secondary to endocrine disorders: Secondary | ICD-10-CM

## 2022-03-14 DIAGNOSIS — E1142 Type 2 diabetes mellitus with diabetic polyneuropathy: Secondary | ICD-10-CM

## 2022-03-22 ENCOUNTER — Other Ambulatory Visit: Payer: Self-pay | Admitting: Family Medicine

## 2022-03-22 DIAGNOSIS — I10 Essential (primary) hypertension: Secondary | ICD-10-CM

## 2022-03-24 NOTE — Telephone Encounter (Signed)
rx was dc on 01/27/21 d/t dose change. Requested Prescriptions  Pending Prescriptions Disp Refills  . amLODipine (NORVASC) 10 MG tablet [Pharmacy Med Name: AMLODIPINE BESYLATE '10MG'$  TABLETS] 90 tablet 1    Sig: TAKE 1 TABLET(10 MG) BY MOUTH DAILY     Cardiovascular: Calcium Channel Blockers 2 Passed - 03/22/2022 10:34 AM      Passed - Last BP in normal range    BP Readings from Last 1 Encounters:  03/04/22 118/66         Passed - Last Heart Rate in normal range    Pulse Readings from Last 1 Encounters:  03/04/22 64         Passed - Valid encounter within last 6 months    Recent Outpatient Visits          5 months ago Type 2 diabetes mellitus with diabetic polyneuropathy, without long-term current use of insulin ([place])   Egan, [place], MD   9 months ago Type 2 diabetes mellitus with diabetic polyneuropathy, without long-term current use of insulin ([place])   Alden, [place], MD   1 year ago Type 2 diabetes mellitus with diabetic polyneuropathy, without long-term current use of insulin ([place])   [place], [place], MD   2 years ago Type 2 diabetes mellitus with diabetic polyneuropathy, without long-term current use of insulin ([place])   [place], MD   2 years ago Tobacco use   [place], Enobong, MD      Future Appointments            In 1 month Ninfa Linden, Lind Guest, MD [place]

## 2022-04-17 ENCOUNTER — Other Ambulatory Visit: Payer: Self-pay | Admitting: Internal Medicine

## 2022-04-19 ENCOUNTER — Other Ambulatory Visit: Payer: Self-pay | Admitting: Family Medicine

## 2022-04-19 DIAGNOSIS — E1159 Type 2 diabetes mellitus with other circulatory complications: Secondary | ICD-10-CM

## 2022-04-21 NOTE — Telephone Encounter (Signed)
Requested medication (s) are due for refill today: yes  Requested medication (s) are on the active medication list: yes  Last refill:  01/26/22 #180  Future visit scheduled: no  Notes to clinic:  Called pt and unable to LM. Overdue lab work   Requested Prescriptions  Pending Prescriptions Disp Refills   metFORMIN (GLUCOPHAGE) 1000 MG tablet [Pharmacy Med Name: METFORMIN 1000MG TABLETS] 180 tablet 0    Sig: TAKE 1 TABLET(1000 MG) BY MOUTH TWICE DAILY WITH A MEAL     Endocrinology:  Diabetes - Biguanides Failed - 04/19/2022  9:33 AM      Failed - Cr in normal range and within 360 days    Creat  Date Value Ref Range Status  11/01/2015 1.20 (H) 0.50 - 0.99 mg/dL Final   Creatinine, Ser  Date Value Ref Range Status  10/01/2021 1.10 (H) 0.57 - 1.00 mg/dL Final   Creatinine, Urine  Date Value Ref Range Status  11/01/2015 82 20 - 320 mg/dL Final         Failed - HBA1C is between 0 and 7.9 and within 180 days    HbA1c, POC (controlled diabetic range)  Date Value Ref Range Status  10/01/2021 6.7 0.0 - 7.0 % Final         Failed - eGFR in normal range and within 360 days    GFR, Est African American  Date Value Ref Range Status  01/11/2014 66 mL/min Final   GFR calc Af Amer  Date Value Ref Range Status  02/21/2020 55 (L) >59 mL/min/1.73 Final    Comment:    **[facility] currently reports eGFR in compliance with the current**   recommendations of the [employer]. [facility] will   update reporting as new guidelines are published from the NKF-ASN   Task force.    GFR, Est Non African American  Date Value Ref Range Status  01/11/2014 57 (L) mL/min Final    Comment:      The estimated GFR is a calculation valid for adults (>=107 years old) that uses the CKD-EPI algorithm to adjust for age and sex. It is   not to be used for children, pregnant women, hospitalized patients,    patients on dialysis, or with rapidly changing kidney function. According to the NKDEP, eGFR  >89 is normal, 60-89 shows mild impairment, 30-59 shows moderate impairment, 15-29 shows severe impairment and <15 is ESRD.     GFR, Estimated  Date Value Ref Range Status  02/28/2021 58 (L) >60 mL/min Final    Comment:    (NOTE) Calculated using the CKD-EPI Creatinine Equation (2021)    eGFR  Date Value Ref Range Status  10/01/2021 54 (L) >59 mL/min/1.73 Final         Failed - B12 Level in normal range and within 720 days    No results found for: "VITAMINB12"       Failed - Valid encounter within last 6 months    Recent Outpatient Visits           6 months ago Type 2 diabetes mellitus with diabetic polyneuropathy, without long-term current use of insulin ([place])   [place], [place], MD   10 months ago Type 2 diabetes mellitus with diabetic polyneuropathy, without long-term current use of insulin ([place])   [place], Charlane Ferretti, MD   1 year ago Type 2 diabetes mellitus with diabetic polyneuropathy, without long-term current use of insulin ([place])  [place], [place], MD   2 years ago Type 2 diabetes mellitus with diabetic polyneuropathy, without long-term current use of insulin ([place])   [place], Charlane Ferretti, MD   2 years ago Tobacco use   [place], Enobong, MD       Future Appointments             In 3 weeks Mcarthur Rossetti, MD [place]            Failed - CBC within normal limits and completed in the last 12 months    WBC  Date Value Ref Range Status  02/28/2021 13.5 (H) 4.0 - 10.5 K/uL Final   RBC  Date Value Ref Range Status  02/28/2021 3.61 (L) 3.87 - 5.11 MIL/uL Final   Hemoglobin  Date Value Ref Range Status  02/28/2021 11.8 (L) 12.0 - 15.0 g/dL Final  07/01/2019 15.3 11.1 - 15.9 g/dL Final   HCT  Date Value Ref Range Status  02/28/2021  34.4 (L) 36.0 - 46.0 % Final   Hematocrit  Date Value Ref Range Status  07/01/2019 46.5 34.0 - 46.6 % Final   MCHC  Date Value Ref Range Status  02/28/2021 34.3 30.0 - 36.0 g/dL Final   [facility]  Date Value Ref Range Status  02/28/2021 32.7 26.0 - 34.0 pg Final   MCV  Date Value Ref Range Status  02/28/2021 95.3 80.0 - 100.0 fL Final  07/01/2019 98 (H) 79 - 97 fL Final   No results found for: "PLTCOUNTKUC", "LABPLAT", "POCPLA" RDW  Date Value Ref Range Status  02/28/2021 12.7 11.5 - 15.5 % Final  07/01/2019 12.6 11.7 - 15.4 % Final

## 2022-04-21 NOTE — Telephone Encounter (Signed)
Needs appt for further refills.

## 2022-04-28 ENCOUNTER — Other Ambulatory Visit: Payer: Self-pay | Admitting: Family Medicine

## 2022-04-28 DIAGNOSIS — E1159 Type 2 diabetes mellitus with other circulatory complications: Secondary | ICD-10-CM

## 2022-04-28 DIAGNOSIS — I152 Hypertension secondary to endocrine disorders: Secondary | ICD-10-CM

## 2022-04-29 NOTE — Telephone Encounter (Signed)
Requested medication (s) are due for refill today: yes  Requested medication (s) are on the active medication list: yes  Last refill:  03/14/22 #60 with 0 RF  Future visit scheduled: no, last seen 10/01/22, NO SHOW 12/30/21, had a curtesy refill.  Notes to clinic:  Has already had a curtesy refill and there is no upcoming appointment scheduled.       Requested Prescriptions  Pending Prescriptions Disp Refills   carvedilol (COREG) 12.5 MG tablet [Pharmacy Med Name: CARVEDILOL 12.'5MG'$  TABLETS] 180 tablet     Sig: TAKE 1 TABLET(12.5 MG) BY MOUTH TWICE DAILY WITH A MEAL     Cardiovascular: Beta Blockers 3 Failed - 04/28/2022  1:55 PM      Failed - Cr in normal range and within 360 days    Creat  Date Value Ref Range Status  11/01/2015 1.20 (H) 0.50 - 0.99 mg/dL Final   Creatinine, Ser  Date Value Ref Range Status  10/01/2021 1.10 (H) 0.57 - 1.00 mg/dL Final   Creatinine, Urine  Date Value Ref Range Status  11/01/2015 82 20 - 320 mg/dL Final         Failed - Valid encounter within last 6 months    Recent Outpatient Visits           7 months ago Type 2 diabetes mellitus with diabetic polyneuropathy, without long-term current use of insulin ([place])   O'Brien, [place], MD   10 months ago Type 2 diabetes mellitus with diabetic polyneuropathy, without long-term current use of insulin ([place])   [place], [place], MD   1 year ago Type 2 diabetes mellitus with diabetic polyneuropathy, without long-term current use of insulin ([place])   [place], [place], MD   2 years ago Type 2 diabetes mellitus with diabetic polyneuropathy, without long-term current use of insulin ([place])   [place] DeRidder, [place], MD   2 years ago Tobacco use   [place], Charlane Ferretti, MD       Future Appointments             In 2  weeks Mcarthur Rossetti, MD [place] - AST in normal range and within 360 days    AST  Date Value Ref Range Status  10/01/2021 21 0 - 40 IU/L Final         Passed - ALT in normal range and within 360 days    ALT  Date Value Ref Range Status  10/01/2021 18 0 - 32 IU/L Final         Passed - Last BP in normal range    BP Readings from Last 1 Encounters:  03/04/22 118/66         Passed - Last Heart Rate in normal range    Pulse Readings from Last 1 Encounters:  03/04/22 64

## 2022-05-02 ENCOUNTER — Other Ambulatory Visit: Payer: Self-pay | Admitting: Family Medicine

## 2022-05-02 ENCOUNTER — Other Ambulatory Visit: Payer: Self-pay | Admitting: Pharmacist

## 2022-05-02 DIAGNOSIS — I152 Hypertension secondary to endocrine disorders: Secondary | ICD-10-CM

## 2022-05-02 DIAGNOSIS — E1159 Type 2 diabetes mellitus with other circulatory complications: Secondary | ICD-10-CM

## 2022-05-02 MED ORDER — CARVEDILOL 12.5 MG PO TABS: ORAL_TABLET | ORAL | 0 refills | Status: DC

## 2022-05-02 MED ORDER — AMLODIPINE BESYLATE 5 MG PO TABS: 5.0000 mg | ORAL_TABLET | Freq: Every day | ORAL | 0 refills | Status: DC

## 2022-05-13 ENCOUNTER — Encounter: Payer: Self-pay | Admitting: Orthopaedic Surgery

## 2022-05-13 ENCOUNTER — Ambulatory Visit (INDEPENDENT_AMBULATORY_CARE_PROVIDER_SITE_OTHER): Payer: BC Managed Care – PPO | Admitting: Orthopaedic Surgery

## 2022-05-13 DIAGNOSIS — M1712 Unilateral primary osteoarthritis, left knee: Secondary | ICD-10-CM | POA: Diagnosis not present

## 2022-05-13 DIAGNOSIS — M25562 Pain in left knee: Secondary | ICD-10-CM | POA: Diagnosis not present

## 2022-05-13 DIAGNOSIS — G8929 Other chronic pain: Secondary | ICD-10-CM

## 2022-05-13 MED ORDER — LIDOCAINE HCL 1 % IJ SOLN
3.0000 mL | INTRAMUSCULAR | Status: AC | PRN
  Administered 2022-05-13: 3 mL

## 2022-05-13 MED ORDER — METHYLPREDNISOLONE ACETATE 40 MG/ML IJ SUSP
40.0000 mg | INTRAMUSCULAR | Status: AC | PRN
  Administered 2022-05-13: 40 mg via INTRA_ARTICULAR

## 2022-05-13 NOTE — Progress Notes (Signed)
Office Visit Note   Patient: Tammy Graham           Date of Birth: 14-Mar-1951           MRN: [id number] Visit Date: 05/13/2022              Requested by: Charlott Rakes, MD [address],  [place] [address] PCP: Charlott Rakes, MD   Assessment & Plan: Visit Diagnoses:  1. Chronic pain of left knee   2. Unilateral primary osteoarthritis, left knee     Plan: I did aspirate about 20 cc of fluid from the patient's knee today and then placed a steroid injection in her left knee without difficulty.  She will see [name] back in about 4 months from now.  At that point we can work on setting her up for knee replacement in January if this is what she still wants to do.  All question concerns were answered and addressed.  Follow-Up Instructions: Return in about 4 months (around 09/13/2022).   Orders:  Orders Placed This Encounter  Procedures   Large Joint Inj   No orders of the defined types were placed in this encounter.     Procedures: Large Joint Inj: L knee on 05/13/2022 11:17 AM Indications: diagnostic evaluation and pain Details: 22 G 1.5 in needle, superolateral approach  Arthrogram: No  Medications: 3 mL lidocaine 1 %; 40 mg methylPREDNISolone acetate 40 MG/ML Outcome: tolerated well, no immediate complications Procedure, treatment alternatives, risks and benefits explained, specific risks discussed. Consent was given by the patient. Immediately prior to procedure a time out was called to verify the correct patient, procedure, equipment, support staff and site/side marked as required. Patient was prepped and draped in the usual sterile fashion.       Clinical Data: No additional findings.   Subjective: Chief Complaint  Patient presents with   Left Knee - Follow-up  The patient is a 71 year old female was seen before.  She has debilitating well-documented arthritis of her left knee.  A steroid injection lasted her about 3 months.  She wants to  have one again today and consider knee replacement surgery sometime later this year really and in January.  She lives alone and knows she would have to have people to come be with her.  She sees her primary care physician sometime in the next week or 2.  She is a thin individual and denies any active issues in her medical status.  Her left knee does hurt on a daily basis and can be 10 out of 10 in terms of pain.  She develops an effusion and swelling with that knee and at this point her left knee pain from osteoarthritis is definitely affecting her mobility, her quality of life, and her actives daily living.  HPI  Review of Systems There is no listed fever, chills, nausea, vomiting  Objective: Vital Signs: There were no vitals taken for this visit.  Physical Exam She is alert and orient x3 and in no acute distress Ortho Exam Examination of her left knee shows significant varus malalignment.  Is a thin knee.  There is a large effusion.  There is pain throughout the arc of motion with medial tenderness lateral tenderness and patellofemoral crepitation and tenderness. Specialty Comments:  No specialty comments available.  Imaging: No results found.   PMFS History: Patient Active Problem List   Diagnosis Date Noted   Unilateral primary osteoarthritis, left knee 02/12/2022   PAD (peripheral  artery disease) ([place]) 02/27/2021   Pain due to onychomycosis of toenails of both feet 07/05/2019   Right flank pain 07/01/2019   Dysuria 01/28/2017   Vaginal itching 01/24/2016   Midline low back pain without sciatica 01/24/2016   Type 2 diabetes mellitus without complication, without long-term current use of insulin ([place]) 12/06/2015   Pap smear for cervical cancer screening 12/06/2015   Dyslipidemia 11/29/2015   Cough 11/01/2015   Hip pain 02/08/2015   Essential hypertension 03/22/2014   Mixed hyperlipidemia 03/22/2014   Tobacco use 03/22/2014   Type 2 diabetes mellitus ([place]) 03/22/2014    Anxiety 03/22/2014   CAD (coronary artery disease) 05/06/2011   Past Medical History:  Diagnosis Date   Anxiety    Arthritis    shoulder and knees   CAD (coronary artery disease)    Complication of anesthesia    states she was still awake one time when surgery started   DM (diabetes mellitus) ([place])    HTN (hypertension)    Hyperlipemia    Myocardial infarction ([place])    Peripheral vascular disease ([place])    Pneumonia    Sleep apnea    no cpap   Stroke [facility])    2012    Family History  Problem Relation Age of Onset   Heart attack Mother    Anesthesia problems Neg Hx    Hypotension Neg Hx    Malignant hyperthermia Neg Hx    Pseudochol deficiency Neg Hx     Past Surgical History:  Procedure Laterality Date   ABDOMINAL AORTOGRAM W/LOWER EXTREMITY Bilateral 02/11/2021   Procedure: ABDOMINAL AORTOGRAM W/LOWER EXTREMITY;  Surgeon: Waynetta Sandy, MD;  Location: [facility];  Service: Cardiovascular;  Laterality: Bilateral;   CARDIAC CATHETERIZATION     COLONOSCOPY     COLONOSCOPY  09/25/2011   Procedure: COLONOSCOPY;  Surgeon: Winfield Cunas., MD;  Location: [facility];  Service: Endoscopy;  Laterality: N/A;   CORONARY STENT PLACEMENT     Ostial LAD stent in 2012   FEMORAL-POPLITEAL BYPASS GRAFT Right 02/27/2021   Procedure: RIGHT COMMON FEMORAL- ABOVE KNEE POPLITEAL ARTERY BYPASS GRAFT;  Surgeon: Waynetta Sandy, MD;  Location: [facility] OR;  Service: Vascular;  Laterality: Right;   Left knee surgery     Social History   Occupational History   Not on file  Tobacco Use   Smoking status: Former    Packs/day: 0.25    Years: 15.00    Total pack years: 3.75    Types: Cigarettes    Quit date: 02/27/2021    Years since quitting: 1.2   Smokeless tobacco: Never   Tobacco comments:    4-5 daily  Vaping Use   Vaping Use: Never used  Substance and Sexual Activity   Alcohol use: Yes    Alcohol/week: 1.0 standard drink of alcohol    Types: 1 Shots of  liquor per week    Comment: occasionally    Drug use: No   Sexual activity: Not Currently    Birth control/protection: None

## 2022-05-27 ENCOUNTER — Ambulatory Visit: Payer: BC Managed Care – PPO | Attending: Family Medicine | Admitting: Family Medicine

## 2022-05-27 ENCOUNTER — Encounter: Payer: Self-pay | Admitting: Family Medicine

## 2022-05-27 VITALS — BP 125/77 | HR 75 | Temp 98.0°F | Ht 66.0 in | Wt 157.6 lb

## 2022-05-27 DIAGNOSIS — Z1211 Encounter for screening for malignant neoplasm of colon: Secondary | ICD-10-CM

## 2022-05-27 DIAGNOSIS — M1712 Unilateral primary osteoarthritis, left knee: Secondary | ICD-10-CM

## 2022-05-27 DIAGNOSIS — E1159 Type 2 diabetes mellitus with other circulatory complications: Secondary | ICD-10-CM

## 2022-05-27 DIAGNOSIS — I152 Hypertension secondary to endocrine disorders: Secondary | ICD-10-CM | POA: Diagnosis not present

## 2022-05-27 DIAGNOSIS — I739 Peripheral vascular disease, unspecified: Secondary | ICD-10-CM

## 2022-05-27 DIAGNOSIS — E1142 Type 2 diabetes mellitus with diabetic polyneuropathy: Secondary | ICD-10-CM

## 2022-05-27 DIAGNOSIS — D2371 Other benign neoplasm of skin of right lower limb, including hip: Secondary | ICD-10-CM | POA: Diagnosis not present

## 2022-05-27 LAB — POCT GLYCOSYLATED HEMOGLOBIN (HGB A1C): HbA1c, POC (controlled diabetic range): 6.9 % (ref 0.0–7.0)

## 2022-05-27 LAB — GLUCOSE, POCT (MANUAL RESULT ENTRY): POC Glucose: 186 mg/dl — AB (ref 70–99)

## 2022-05-27 MED ORDER — AMLODIPINE BESYLATE 5 MG PO TABS
5.0000 mg | ORAL_TABLET | Freq: Every day | ORAL | 1 refills | Status: DC
Start: 2022-05-27 — End: 2022-08-05

## 2022-05-27 MED ORDER — ACETAMINOPHEN-CODEINE 300-30 MG PO TABS: 1.0000 | ORAL_TABLET | Freq: Two times a day (BID) | ORAL | 1 refills | Status: DC | PRN

## 2022-05-27 MED ORDER — CARVEDILOL 12.5 MG PO TABS: ORAL_TABLET | ORAL | 1 refills | Status: DC

## 2022-05-27 MED ORDER — GABAPENTIN 300 MG PO CAPS: ORAL_CAPSULE | ORAL | 1 refills | Status: DC

## 2022-05-27 MED ORDER — GLIPIZIDE ER 10 MG PO TB24: 10.0000 mg | ORAL_TABLET | Freq: Every day | ORAL | 1 refills | Status: DC

## 2022-05-27 MED ORDER — DAPAGLIFLOZIN PROPANEDIOL 10 MG PO TABS: 10.0000 mg | ORAL_TABLET | Freq: Every day | ORAL | 1 refills | Status: DC

## 2022-05-27 NOTE — Progress Notes (Signed)
Subjective:  Patient ID: Tammy Graham, female    DOB: [birth year]  Age: 71 y.o. MRN: [id number]  CC: Annual Exam   HPI ANABELEN KAMINSKY is a 71 y.o. year old female with a history of coronary artery disease (status post DES), type 2 diabetes mellitus (A1c 6.7), hypertension, PAD (s/p right common femoral to above-knee popliteal bypass in 02/2021), L knee OA, tobacco abuse who presents today for chronic disease management.  Interval History: She had a visit with cardiology 3 months ago with no changes in regimen and follow-up in 12 months advised. Osteoarthritis is managed by orthopedic, Dr. Ninfa Linden with last visit in 01/2022 when she had aspiration of fluid followed by steroid injection in her left knee.She is working towards a knee replacement later on in the year  She would like to come off Metformin as 'she did research and found out that it is deteriorating her muscles' and states her other specialists are in agreement.  She states she has been on metformin for a long time and she is tired of taking generic medications.  She would like to take the more expensive stuff since she has 3 insurance companies which should be able to pay for her medication. Denies hypoglycemic symptoms, neuropathy.  Doing well on her statin with no adverse effects. Also tolerating her antihypertensive. Denies additional concerns. Past Medical History:  Diagnosis Date   Anxiety    Arthritis    shoulder and knees   CAD (coronary artery disease)    Complication of anesthesia    states she was still awake one time when surgery started   DM (diabetes mellitus) ([place])    HTN (hypertension)    Hyperlipemia    Myocardial infarction [facility])    Peripheral vascular disease ([place])    Pneumonia    Sleep apnea    no cpap   Stroke [facility])    2012    Past Surgical History:  Procedure Laterality Date   ABDOMINAL AORTOGRAM W/LOWER EXTREMITY Bilateral 02/11/2021   Procedure: ABDOMINAL AORTOGRAM W/LOWER EXTREMITY;   Surgeon: Waynetta Sandy, MD;  Location: [facility];  Service: Cardiovascular;  Laterality: Bilateral;   CARDIAC CATHETERIZATION     COLONOSCOPY     COLONOSCOPY  09/25/2011   Procedure: COLONOSCOPY;  Surgeon: Winfield Cunas., MD;  Location: [facility];  Service: Endoscopy;  Laterality: N/A;   CORONARY STENT PLACEMENT     Ostial LAD stent in 2012   FEMORAL-POPLITEAL BYPASS GRAFT Right 02/27/2021   Procedure: RIGHT COMMON FEMORAL- ABOVE KNEE POPLITEAL ARTERY BYPASS GRAFT;  Surgeon: Waynetta Sandy, MD;  Location: [facility];  Service: Vascular;  Laterality: Right;   Left knee surgery      Family History  Problem Relation Age of Onset   Heart attack Mother    Anesthesia problems Neg Hx    Hypotension Neg Hx    Malignant hyperthermia Neg Hx    Pseudochol deficiency Neg Hx     Social History   Socioeconomic History   Marital status: Widowed    Spouse name: Not on file   Number of children: Not on file   Years of education: Not on file   Highest education level: Not on file  Occupational History   Not on file  Tobacco Use   Smoking status: Former    Packs/day: 0.25    Years: 15.00    Total pack years: 3.75    Types: Cigarettes    Quit date: 02/27/2021  Years since quitting: 1.2   Smokeless tobacco: Never   Tobacco comments:    4-5 daily  Vaping Use   Vaping Use: Never used  Substance and Sexual Activity   Alcohol use: Yes    Alcohol/week: 1.0 standard drink of alcohol    Types: 1 Shots of liquor per week    Comment: occasionally    Drug use: No   Sexual activity: Not Currently    Birth control/protection: None  Other Topics Concern   Not on file  Social History Narrative   Not on file   Social Determinants of Health   Financial Resource Strain: Not on file  Food Insecurity: Not on file  Transportation Needs: Not on file  Physical Activity: Not on file  Stress: Not on file  Social Connections: Not on file    Allergies  Allergen  Reactions   Benadryl [Diphenhydramine Hcl (Sleep)] Other (See Comments)    unknown   Rosuvastatin Hives    Patient is allergic to generic rosuvastatin. She says she is able to take branded Crestor.    Simvastatin Other (See Comments)    unknown    Outpatient Medications Prior to Visit  Medication Sig Dispense Refill   ACCU-CHEK AVIVA PLUS test strip USE AS INSTRUCTED 100 strip 0   Accu-Chek FastClix Lancets MISC USE AS INSTRUCTED 102 each 11   Accu-Chek FastClix Lancets MISC Use daily as directed 102 each 3   ACCU-CHEK GUIDE test strip USE AS INSTRUCTED 100 strip 1   aspirin EC 81 MG tablet Take 81 mg by mouth in the morning. Swallow whole.     atorvastatin (LIPITOR) 10 MG tablet TAKE 1 TABLET(10 MG) BY MOUTH DAILY 90 tablet 3   Blood Glucose Monitoring Suppl (ACCU-CHEK GUIDE ME) w/Device KIT 1 kit by Does not apply route daily. Use to check blood sugar daily. 1 kit 0   carboxymethylcellul-glycerin (LUBRICANT DROPS/DUAL-ACTION) 0.5-0.9 % ophthalmic solution Place 1-2 drops into both eyes 3 (three) times daily as needed for dry eyes.     glucose blood test strip Use as instructed 100 each 12   Misc. Devices MISC Shower bench, Above the toilet seat.  Diagnosis- Type 2 Diabetes Mellitus 1 each 0   Multiple Vitamin (MULTIVITAMIN WITH MINERALS) TABS tablet Take 1 tablet by mouth in the morning.     nicotine (NICODERM CQ - DOSED IN MG/24 HOURS) 14 mg/24hr patch Place 1 patch (14 mg total) onto the skin daily. 28 patch 0   nitroGLYCERIN (NITROSTAT) 0.4 MG SL tablet Place 0.4 mg under the tongue every 5 (five) minutes as needed for chest pain.     oxyCODONE-acetaminophen (PERCOCET/ROXICET) 5-325 MG tablet Take 1 tablet by mouth every 4 (four) hours as needed for moderate pain or severe pain. 30 tablet 0   PRESCRIPTION MEDICATION Apply 1 application topically in the morning and at bedtime. Kamea Foot Cream     acetaminophen-codeine (TYLENOL #3) 300-30 MG tablet Take 1 tablet by mouth every 12  (twelve) hours as needed for moderate pain. 60 tablet 0   acetaminophen-codeine (TYLENOL #3) 300-30 MG tablet TAKE 1 TABLET BY MOUTH EVERY 12 HOURS AS NEEDED FOR MODERATE PAIN 60 tablet 0   amLODipine (NORVASC) 5 MG tablet Take 1 tablet (5 mg total) by mouth daily. 30 tablet 0   carvedilol (COREG) 12.5 MG tablet TAKE 1 TABLET(12.5 MG) BY MOUTH TWICE DAILY WITH A MEAL 60 tablet 0   gabapentin (NEURONTIN) 300 MG capsule TAKE 1 CAPSULE(300 MG) BY MOUTH AT BEDTIME.  Needs appointment with Dr. Margarita Rana prior to next refill request. 60 capsule 0   glipiZIDE (GLUCOTROL) 10 MG tablet TAKE 1 TABLET BY MOUTH DAILY BEFORE BREAKFAST 90 tablet 1   metFORMIN (GLUCOPHAGE) 1000 MG tablet TAKE 1 TABLET(1000 MG) BY MOUTH TWICE DAILY WITH A MEAL 180 tablet 0   No facility-administered medications prior to visit.     ROS Review of Systems  Constitutional:  Negative for activity change and appetite change.  HENT:  Negative for sinus pressure and sore throat.   Respiratory:  Negative for chest tightness, shortness of breath and wheezing.   Cardiovascular:  Negative for chest pain and palpitations.  Gastrointestinal:  Negative for abdominal distention, abdominal pain and constipation.  Genitourinary: Negative.   Musculoskeletal:        See HPI  Psychiatric/Behavioral:  Negative for behavioral problems and dysphoric mood.     Objective:  BP 125/77   Pulse 75   Temp 98 F (36.7 C) (Oral)   Ht 5' 6"  (1.676 m)   Wt 157 lb 9.6 oz (71.5 kg)   SpO2 96%   BMI 25.44 kg/m      05/27/2022   10:17 AM 03/04/2022    3:25 PM 01/29/2022   10:57 AM  BP/Weight  Systolic BP 528 413 244  Diastolic BP 77 66 92  Wt. (Lbs) 157.6 157.2 157.5  BMI 25.44 kg/m2 25.37 kg/m2 25.42 kg/m2      Physical Exam Constitutional:      Appearance: She is well-developed.  Cardiovascular:     Rate and Rhythm: Normal rate.     Heart sounds: Normal heart sounds. No murmur heard. Pulmonary:     Effort: Pulmonary effort is normal.      Breath sounds: Normal breath sounds. No wheezing or rales.  Chest:     Chest wall: No tenderness.  Abdominal:     General: Bowel sounds are normal. There is no distension.     Palpations: Abdomen is soft. There is no mass.     Tenderness: There is no abdominal tenderness.  Musculoskeletal:     Right lower leg: No edema.     Left lower leg: No edema.     Comments: Tenderness on flexion and extension of left knee  Neurological:     Mental Status: She is alert and oriented to person, place, and time.     Gait: Gait abnormal.  Psychiatric:        Mood and Affect: Mood normal.   Diabetic Foot Exam - Simple   Simple Foot Form Diabetic Foot exam was performed with the following findings: Yes 05/27/2022 10:54 AM  Visual Inspection No deformities, no ulcerations, no other skin breakdown bilaterally: Yes Sensation Testing Intact to touch and monofilament testing bilaterally: Yes Pulse Check Posterior Tibialis and Dorsalis pulse intact bilaterally: Yes Comments        Latest Ref Rng & Units 10/01/2021   10:35 AM 02/28/2021    1:55 AM 02/27/2021    8:24 PM  CMP  Glucose 70 - 99 mg/dL 116  264    BUN 8 - 27 mg/dL 12  10    Creatinine 0.57 - 1.00 mg/dL 1.10  1.04  0.95   Sodium 134 - 144 mmol/L 143  136    Potassium 3.5 - 5.2 mmol/L 4.3  4.3    Chloride 96 - 106 mmol/L 102  102    CO2 20 - 29 mmol/L 26  26    Calcium 8.7 - 10.3 mg/dL 10.8  9.1    Total Protein 6.0 - 8.5 g/dL 7.1     Total Bilirubin 0.0 - 1.2 mg/dL 0.8     Alkaline Phos 44 - 121 IU/L 91     AST 0 - 40 IU/L 21     ALT 0 - 32 IU/L 18       Lipid Panel     Component Value Date/Time   CHOL 130 10/01/2021 1035   TRIG 73 10/01/2021 1035   HDL 55 10/01/2021 1035   CHOLHDL 2.2 02/28/2021 0155   VLDL 12 02/28/2021 0155   LDLCALC 60 10/01/2021 1035    CBC    Component Value Date/Time   WBC 13.5 (H) 02/28/2021 0155   RBC 3.61 (L) 02/28/2021 0155   HGB 11.8 (L) 02/28/2021 0155   HGB 15.3 07/01/2019 1550    HCT 34.4 (L) 02/28/2021 0155   HCT 46.5 07/01/2019 1550   PLT 193 02/28/2021 0155   PLT 257 07/01/2019 1550   MCV 95.3 02/28/2021 0155   MCV 98 (H) 07/01/2019 1550   MCH 32.7 02/28/2021 0155   MCHC 34.3 02/28/2021 0155   RDW 12.7 02/28/2021 0155   RDW 12.6 07/01/2019 1550   LYMPHSABS 2.9 09/11/2016 2316   MONOABS 0.4 09/11/2016 2316   EOSABS 0.2 09/11/2016 2316   BASOSABS 0.0 09/11/2016 2316    Lab Results  Component Value Date   HGBA1C 6.9 05/27/2022    Assessment & Plan:  1. Type 2 diabetes mellitus with diabetic polyneuropathy, without long-term current use of insulin (HCC) Controlled with A1c of 6.9 Due to desire to come off metformin I have discontinued this Placed on Farxiga and will order labs today and also recheck in 2 weeks to follow-up on renal function She is currently on glipizide and has made it clear she is not interested in getting on an injectable. Counseled on Diabetic diet, my plate method, 132 minutes of moderate intensity exercise/week Blood sugar logs with fasting goals of 80-120 mg/dl, random of less than 180 and in the event of sugars less than 60 mg/dl or greater than 400 mg/dl encouraged to notify the clinic. Advised on the need for annual eye exams, annual foot exams, Pneumonia vaccine. - POCT glycosylated hemoglobin (Hb A1C) - POCT glucose (manual entry) - Microalbumin/Creatinine Ratio, Urine - dapagliflozin propanediol (FARXIGA) 10 MG TABS tablet; Take 1 tablet (10 mg total) by mouth daily before breakfast.  Dispense: 90 tablet; Refill: 1 - CMP14+EGFR - gabapentin (NEURONTIN) 300 MG capsule; TAKE 1 CAPSULE(300 MG) BY MOUTH AT BEDTIME.  Dispense: 90 capsule; Refill: 1 - Basic Metabolic Panel; Future - LP+Non-HDL Cholesterol; Future - glipiZIDE (GLIPIZIDE XL) 10 MG 24 hr tablet; Take 1 tablet (10 mg total) by mouth daily with breakfast.  Dispense: 90 tablet; Refill: 1  2. Hypertension associated with diabetes ([place]) Controlled Counseled on blood  pressure goal of less than 130/80, low-sodium, DASH diet, medication compliance, 150 minutes of moderate intensity exercise per week. Discussed medication compliance, adverse effects. - amLODipine (NORVASC) 5 MG tablet; Take 1 tablet (5 mg total) by mouth daily.  Dispense: 90 tablet; Refill: 1 - carvedilol (COREG) 12.5 MG tablet; TAKE 1 TABLET(12.5 MG) BY MOUTH TWICE DAILY WITH A MEAL  Dispense: 180 tablet; Refill: 1  3. Screening for colon cancer - Cologuard  4. Peripheral arterial disease (HCC) S/p right common femoral to above-knee popliteal bypass in 02/2021  5. Osteoarthritis of left knee, unspecified osteoarthritis type Status post cortisone injection Currently being worked up for surgery later on  in the year Follow-up with orthopedics - acetaminophen-codeine (TYLENOL #3) 300-30 MG tablet; Take 1 tablet by mouth every 12 (twelve) hours as needed for moderate pain.  Dispense: 60 tablet; Refill: 1   Health Care Maintenance: Declines Shingrix Meds ordered this encounter  Medications   dapagliflozin propanediol (FARXIGA) 10 MG TABS tablet    Sig: Take 1 tablet (10 mg total) by mouth daily before breakfast.    Dispense:  90 tablet    Refill:  1    Discontinue Metformin   amLODipine (NORVASC) 5 MG tablet    Sig: Take 1 tablet (5 mg total) by mouth daily.    Dispense:  90 tablet    Refill:  1   carvedilol (COREG) 12.5 MG tablet    Sig: TAKE 1 TABLET(12.5 MG) BY MOUTH TWICE DAILY WITH A MEAL    Dispense:  180 tablet    Refill:  1   gabapentin (NEURONTIN) 300 MG capsule    Sig: TAKE 1 CAPSULE(300 MG) BY MOUTH AT BEDTIME.    Dispense:  90 capsule    Refill:  1   acetaminophen-codeine (TYLENOL #3) 300-30 MG tablet    Sig: Take 1 tablet by mouth every 12 (twelve) hours as needed for moderate pain.    Dispense:  60 tablet    Refill:  1   glipiZIDE (GLIPIZIDE XL) 10 MG 24 hr tablet    Sig: Take 1 tablet (10 mg total) by mouth daily with breakfast.    Dispense:  90 tablet     Refill:  1    Discontinue previous    Follow-up: Return in about 6 months (around 11/27/2022) for Chronic medical conditions.       Charlott Rakes, MD, FAAFP. [facility] [place], [place]   05/27/2022, 10:55 AM

## 2022-05-27 NOTE — Progress Notes (Signed)
Metformin review

## 2022-05-27 NOTE — Patient Instructions (Signed)
Dapagliflozin Tablets What is this medication? DAPAGLIFLOZIN (DAP a gli FLOE zin) treats type 2 diabetes. It works by helping your kidneys remove sugar (glucose) from your blood through the urine, which decreases your blood sugar. It can also be used to lower the risk of heart attack, stroke, kidney disease, and hospitalization for heart failure in people with type 2 diabetes. Changes to diet and exercise are often combined with this medication. This medicine may be used for other purposes; ask your health care provider or pharmacist if you have questions. COMMON BRAND NAME(S): Wilder Glade What should I tell my care team before I take this medication? They need to know if you have any of these conditions: Dehydration Diabetic ketoacidosis Diet low in salt Eating less due to illness, surgery, dieting, or any other reason Frequently drink alcohol Having surgery History of pancreatitis or pancreas problems History of yeast infection of the penis or vagina Infection in the bladder, kidneys, or urinary tract Kidney disease Low blood pressure On dialysis Problems urinating Type 1 diabetes Uncircumcised female An unusual or allergic reaction to dapagliflozin, other medications, foods, dyes, or preservatives Pregnant or trying to get pregnant Breast-feeding How should I use this medication? Take this medication by mouth with water. Take it as directed on the prescription label at the same time every day. You can take it with or without food. If it upsets your stomach, take it with food. Keep taking it unless your care team tells you to stop. A special MedGuide will be given to you by the pharmacist with each prescription and refill. Be sure to read this information carefully each time. Talk to your care team about the use of this medication in children. Special care may be needed. Overdosage: If you think you have taken too much of this medicine contact a poison control center or emergency room at  once. NOTE: This medicine is only for you. Do not share this medicine with others. What if I miss a dose? If you miss a dose, take it as soon as you can. If it is almost time for your next dose, take only that dose. Do not take double or extra doses. What may interact with this medication? Lithium Sulfonylureas, such as glimepiride, glipizide, glyburide This list may not describe all possible interactions. Give your health care provider a list of all the medicines, herbs, non-prescription drugs, or dietary supplements you use. Also tell them if you smoke, drink alcohol, or use illegal drugs. Some items may interact with your medicine. What should I watch for while using this medication? Visit your care team for regular checks on your progress. Tell your care team if your symptoms do not start to get better or if they get worse. This medication can cause a serious condition in which there is too much acid in the blood. If you develop nausea, vomiting, stomach pain, unusual tiredness, or breathing problems, stop taking this medication and call your care team right away. If possible, use a ketone dipstick to check for ketones in your urine. Check with your care team if you have severe diarrhea, nausea, and vomiting, or if you sweat a lot. The loss of too much body fluid may make it dangerous for you to take this medication. A test called the HbA1C (A1C) will be monitored. This is a simple blood test. It measures your blood sugar control over the last 2 to 3 months. You will receive this test every 3 to 6 months. Learn how to check your  blood sugar. Learn the symptoms of low and high blood sugar and how to manage them. Always carry a quick-source of sugar with you in case you have symptoms of low blood sugar. Examples include hard sugar candy or glucose tablets. Make sure others know that you can choke if you eat or drink when you develop serious symptoms of low blood sugar, such as seizures or  unconsciousness. Get medical help at once. Tell your care team if you have high blood sugar. You might need to change the dose of your medication. If you are sick or exercising more than usual, you may need to change the dose of your medication. Do not skip meals. Ask your care team if you should avoid alcohol. Many nonprescription cough and cold products contain sugar or alcohol. These can affect blood sugar. Wear a medical ID bracelet or chain. Carry a card that describes your condition. List the medications and doses you take on the card. What side effects may I notice from receiving this medication? Side effects that you should report to your care team as soon as possible: Allergic reactions--skin rash, itching, hives, swelling of the face, lips, tongue, or throat Dehydration--increased thirst, dry mouth, feeling faint or lightheaded, headache, dark yellow or brown urine Diabetic ketoacidosis (DKA)--increased thirst or amount of urine, dry mouth, fatigue, fruity odor to breath, trouble breathing, stomach pain, nausea, vomiting Genital yeast infection--redness, swelling, pain, or itchiness, odor, thick or lumpy discharge New pain or tenderness, change in skin color, sores or ulcers, infection of the leg or foot Infection or redness, swelling, tenderness, or pain in the genitals, or area from the genitals to the back of the rectum Urinary tract infection (UTI)--burning when passing urine, passing frequent small amounts of urine, bloody or cloudy urine, pain in the lower back or sides This list may not describe all possible side effects. Call your doctor for medical advice about side effects. You may report side effects to FDA at 1-800-FDA-1088. Where should I keep my medication? Keep out of the reach of children and pets. Store at room temperature between 20 and 25 degrees C (68 and 77 degrees F). Get rid of any unused medication after the expiration date. To get rid of medications that are no  longer needed or have expired: Take the medication to a medication take-back program. Check with your pharmacy or law enforcement to find a location. If you cannot return the medication, check the label or package insert to see if the medication should be thrown out in the garbage or flushed down the toilet. If you are not sure, ask your care team. If it is safe to put it in the trash, take the medication out of the container. Mix the medication with cat litter, dirt, coffee grounds, or other unwanted substance. Seal the mixture in a bag or container. Put it in the trash. NOTE: This sheet is a summary. It may not cover all possible information. If you have questions about this medicine, talk to your doctor, pharmacist, or health care provider.  2023 Elsevier/Gold Standard (2020-12-19 00:00:00)

## 2022-05-28 LAB — CMP14+EGFR
ALT: 23 IU/L (ref 0–32)
AST: 26 IU/L (ref 0–40)
Albumin/Globulin Ratio: 2 (ref 1.2–2.2)
Albumin: 4.5 g/dL (ref 3.8–4.8)
Alkaline Phosphatase: 88 IU/L (ref 44–121)
BUN/Creatinine Ratio: 13 (ref 12–28)
BUN: 13 mg/dL (ref 8–27)
Bilirubin Total: 0.8 mg/dL (ref 0.0–1.2)
CO2: 26 mmol/L (ref 20–29)
Calcium: 10.5 mg/dL — ABNORMAL HIGH (ref 8.7–10.3)
Chloride: 103 mmol/L (ref 96–106)
Creatinine, Ser: 0.97 mg/dL (ref 0.57–1.00)
Globulin, Total: 2.3 g/dL (ref 1.5–4.5)
Glucose: 159 mg/dL — ABNORMAL HIGH (ref 70–99)
Potassium: 4.4 mmol/L (ref 3.5–5.2)
Sodium: 142 mmol/L (ref 134–144)
Total Protein: 6.8 g/dL (ref 6.0–8.5)
eGFR: 62 mL/min/{1.73_m2} (ref >59)

## 2022-05-28 LAB — MICROALBUMIN / CREATININE URINE RATIO
Creatinine, Urine: 90.7 mg/dL
Microalb/Creat Ratio: 26 mg/g creat (ref 0–29)
Microalbumin, Urine: 23.6 ug/mL

## 2022-06-05 ENCOUNTER — Other Ambulatory Visit: Payer: Self-pay

## 2022-06-05 ENCOUNTER — Ambulatory Visit
Admission: EM | Admit: 2022-06-05 | Discharge: 2022-06-05 | Disposition: A | Discharge status: Home / Self Care | Payer: BC Managed Care – PPO | Attending: Emergency Medicine | Admitting: Emergency Medicine

## 2022-06-05 ENCOUNTER — Encounter: Payer: Self-pay | Admitting: Emergency Medicine

## 2022-06-05 DIAGNOSIS — R21 Rash and other nonspecific skin eruption: Secondary | ICD-10-CM | POA: Insufficient documentation

## 2022-06-05 DIAGNOSIS — N3090 Cystitis, unspecified without hematuria: Secondary | ICD-10-CM | POA: Insufficient documentation

## 2022-06-05 DIAGNOSIS — N760 Acute vaginitis: Secondary | ICD-10-CM | POA: Diagnosis not present

## 2022-06-05 LAB — POCT URINALYSIS DIP (MANUAL ENTRY)
Bilirubin, UA: NEGATIVE
Blood, UA: NEGATIVE
Glucose, UA: NEGATIVE mg/dL
Ketones, POC UA: NEGATIVE mg/dL
Nitrite, UA: NEGATIVE
Protein Ur, POC: 30 mg/dL — AB
Spec Grav, UA: >=1.03 — AB (ref 1.010–1.025)
Urobilinogen, UA: 1 E.U./dL
pH, UA: 6.5 (ref 5.0–8.0)

## 2022-06-05 MED ORDER — SULFAMETHOXAZOLE-TRIMETHOPRIM 800-160 MG PO TABS: 1.0000 | ORAL_TABLET | Freq: Two times a day (BID) | ORAL | 0 refills | Status: AC

## 2022-06-05 NOTE — ED Triage Notes (Signed)
Patient presents to [facility] for evaluation rash to left upper thigh startint today.  Denies pain or itchiness.  Also c/o right groin pain, shooting in nature, coming and going.  Increased urination starting today as well.

## 2022-06-05 NOTE — ED Provider Notes (Signed)
[facility]    CSN: [id number] Arrival date & time: 06/05/22  1237    HISTORY   Chief Complaint  Patient presents with   Rash   Groin Pain   HPI Tammy Graham is a pleasant, 71 y.o. female who presents to urgent care today. Patient c/o rash to left upper thigh starting today.  Denies pain or itchiness.  Also c/o right groin pain, shooting in nature, coming and going.  Noticed increased frequency of urination starting today as well.    The history is provided by the patient.   Past Medical History:  Diagnosis Date   Anxiety    Arthritis    shoulder and knees   CAD (coronary artery disease)    Complication of anesthesia    states she was still awake one time when surgery started   DM (diabetes mellitus) ([place])    HTN (hypertension)    Hyperlipemia    Myocardial infarction [facility])    Peripheral vascular disease ([employer])    Pneumonia    Sleep apnea    no cpap   Stroke [facility])    2012   Patient Active Problem List   Diagnosis Date Noted   Unilateral primary osteoarthritis, left knee 02/12/2022   PAD (peripheral artery disease) ([place]) 02/27/2021   Pain due to onychomycosis of toenails of both feet 07/05/2019   Right flank pain 07/01/2019   Dysuria 01/28/2017   Vaginal itching 01/24/2016   Midline low back pain without sciatica 01/24/2016   Type 2 diabetes mellitus without complication, without long-term current use of insulin ([place]) 12/06/2015   Pap smear for cervical cancer screening 12/06/2015   Dyslipidemia 11/29/2015   Cough 11/01/2015   Hip pain 02/08/2015   Essential hypertension 03/22/2014   Mixed hyperlipidemia 03/22/2014   Tobacco use 03/22/2014   Type 2 diabetes mellitus ([place]) 03/22/2014   Anxiety 03/22/2014   CAD (coronary artery disease) 05/06/2011   Past Surgical History:  Procedure Laterality Date   ABDOMINAL AORTOGRAM W/LOWER EXTREMITY Bilateral 02/11/2021   Procedure: ABDOMINAL AORTOGRAM W/LOWER EXTREMITY;  Surgeon: Waynetta Sandy, MD;  Location: [place] [facility];  Service: Cardiovascular;  Laterality: Bilateral;   CARDIAC CATHETERIZATION     COLONOSCOPY     COLONOSCOPY  09/25/2011   Procedure: COLONOSCOPY;  Surgeon: Winfield Cunas., MD;  Location: [facility];  Service: Endoscopy;  Laterality: N/A;   CORONARY STENT PLACEMENT     Ostial LAD stent in 2012   FEMORAL-POPLITEAL BYPASS GRAFT Right 02/27/2021   Procedure: RIGHT COMMON FEMORAL- ABOVE KNEE POPLITEAL ARTERY BYPASS GRAFT;  Surgeon: Waynetta Sandy, MD;  Location: [facility];  Service: Vascular;  Laterality: Right;   Left knee surgery     OB History   No obstetric history on file.    Home Medications    Prior to Admission medications   Medication Sig Start Date End Date Taking? Authorizing Provider  ACCU-CHEK AVIVA PLUS test strip USE AS INSTRUCTED 01/16/21   Charlott Rakes, MD  Accu-Chek FastClix Lancets MISC USE AS INSTRUCTED 10/01/21   Charlott Rakes, MD  Accu-Chek FastClix Lancets MISC Use daily as directed 10/01/21   Charlott Rakes, MD  ACCU-CHEK GUIDE test strip USE AS INSTRUCTED 09/08/20   Charlott Rakes, MD  acetaminophen-codeine (TYLENOL #3) 300-30 MG tablet Take 1 tablet by mouth every 12 (twelve) hours as needed for moderate pain. 05/27/22   Charlott Rakes, MD  amLODipine (NORVASC) 5 MG tablet Take 1 tablet (5 mg total) by mouth daily. 05/27/22  Charlott Rakes, MD  aspirin EC 81 MG tablet Take 81 mg by mouth in the morning. Swallow whole.    [provider]  atorvastatin (LIPITOR) 10 MG tablet TAKE 1 TABLET(10 MG) BY MOUTH DAILY 12/04/21   Charlott Rakes, MD  Blood Glucose Monitoring Suppl (ACCU-CHEK GUIDE ME) w/Device KIT 1 kit by Does not apply route daily. Use to check blood sugar daily. 01/21/19   Charlott Rakes, MD  carboxymethylcellul-glycerin (LUBRICANT DROPS/DUAL-ACTION) 0.5-0.9 % ophthalmic solution Place 1-2 drops into both eyes 3 (three) times daily as needed for dry eyes.    [provider]   carvedilol (COREG) 12.5 MG tablet TAKE 1 TABLET(12.5 MG) BY MOUTH TWICE DAILY WITH A MEAL 05/27/22   Charlott Rakes, MD  dapagliflozin propanediol (FARXIGA) 10 MG TABS tablet Take 1 tablet (10 mg total) by mouth daily before breakfast. 05/27/22   Charlott Rakes, MD  gabapentin (NEURONTIN) 300 MG capsule TAKE 1 CAPSULE(300 MG) BY MOUTH AT BEDTIME. 05/27/22   Charlott Rakes, MD  glipiZIDE (GLIPIZIDE XL) 10 MG 24 hr tablet Take 1 tablet (10 mg total) by mouth daily with breakfast. 05/27/22   Charlott Rakes, MD  glucose blood test strip Use as instructed 01/24/21   Charlott Rakes, MD  Misc. Devices MISC Shower bench, Above the toilet seat.  Diagnosis- Type 2 Diabetes Mellitus 02/22/20   Charlott Rakes, MD  Multiple Vitamin (MULTIVITAMIN WITH MINERALS) TABS tablet Take 1 tablet by mouth in the morning.    [provider]  nicotine (NICODERM CQ - DOSED IN MG/24 HOURS) 14 mg/24hr patch Place 1 patch (14 mg total) onto the skin daily. 03/04/22   Burnell Blanks, MD  nitroGLYCERIN (NITROSTAT) 0.4 MG SL tablet Place 0.4 mg under the tongue every 5 (five) minutes as needed for chest pain.    [provider]  oxyCODONE-acetaminophen (PERCOCET/ROXICET) 5-325 MG tablet Take 1 tablet by mouth every 4 (four) hours as needed for moderate pain or severe pain. 03/06/21   Dagoberto Ligas, PA-C  PRESCRIPTION MEDICATION Apply 1 application topically in the morning and at bedtime. Kamea Foot Cream    [provider]    Family History Family History  Problem Relation Age of Onset   Heart attack Mother    Anesthesia problems Neg Hx    Hypotension Neg Hx    Malignant hyperthermia Neg Hx    Pseudochol deficiency Neg Hx    Social History Social History   Tobacco Use   Smoking status: Former    Packs/day: 0.25    Years: 15.00    Total pack years: 3.75    Types: Cigarettes    Quit date: 02/27/2021    Years since quitting: 1.2   Smokeless tobacco: Never   Tobacco comments:    4-5  daily  Vaping Use   Vaping Use: Never used  Substance Use Topics   Alcohol use: Yes    Alcohol/week: 1.0 standard drink of alcohol    Types: 1 Shots of liquor per week    Comment: occasionally    Drug use: No   Allergies   Benadryl [diphenhydramine hcl (sleep)], Rosuvastatin, and Simvastatin  Review of Systems Review of Systems Pertinent findings revealed after performing a 14 point review of systems has been noted in the history of present illness.  Physical Exam Triage Vital Signs ED Triage Vitals  Enc Vitals Group     BP 08/16/21 0827 (!) 147/82     Pulse Rate 08/16/21 0827 72     Resp 08/16/21 0827  18     Temp 08/16/21 0827 98.3 F (36.8 C)     Temp Source 08/16/21 0827 Oral     SpO2 08/16/21 0827 98 %     Weight --      Height --      Head Circumference --      Peak Flow --      Pain Score 08/16/21 0826 5     Pain Loc --      Pain Edu? --      Excl. in [place]? --   No data found.  Updated Vital Signs BP (!) 146/78 (BP Location: Left Arm)   Pulse 81   Temp 98.8 F (37.1 C) (Oral)   Resp 18   SpO2 94%   Physical Exam Vitals and nursing note reviewed.  Constitutional:      General: She is not in acute distress.    Appearance: Normal appearance. She is not ill-appearing.  HENT:     Head: Normocephalic and atraumatic.  Eyes:     General: Lids are normal.        Right eye: No discharge.        Left eye: No discharge.     Extraocular Movements: Extraocular movements intact.     Conjunctiva/sclera: Conjunctivae normal.     Right eye: Right conjunctiva is not injected.     Left eye: Left conjunctiva is not injected.  Neck:     Trachea: Trachea and phonation normal.  Cardiovascular:     Rate and Rhythm: Normal rate and regular rhythm.     Pulses: Normal pulses.     Heart sounds: Normal heart sounds. No murmur heard.    No friction rub. No gallop.  Pulmonary:     Effort: Pulmonary effort is normal. No accessory muscle usage, prolonged expiration or  respiratory distress.     Breath sounds: Normal breath sounds. No stridor, decreased air movement or transmitted upper airway sounds. No decreased breath sounds, wheezing, rhonchi or rales.  Chest:     Chest wall: No tenderness.  Abdominal:     General: Abdomen is flat. Bowel sounds are normal. There is no distension.     Palpations: Abdomen is soft.     Tenderness: There is abdominal tenderness in the suprapubic area. There is no right CVA tenderness or left CVA tenderness.     Hernia: No hernia is present.  Musculoskeletal:        General: Normal range of motion.     Cervical back: Normal range of motion and neck supple. Normal range of motion.  Lymphadenopathy:     Cervical: No cervical adenopathy.  Skin:    General: Skin is warm and dry.     Findings: Rash (Erythematous, blisterlike rash in a dermatomal pattern on left upper thigh without signs of excoriation or superficial infection.) present. No erythema.  Neurological:     General: No focal deficit present.     Mental Status: She is alert and oriented to person, place, and time.  Psychiatric:        Mood and Affect: Mood normal.        Behavior: Behavior normal.     Visual Acuity Right Eye Distance:   Left Eye Distance:   Bilateral Distance:    Right Eye Near:   Left Eye Near:    Bilateral Near:     UC Couse / Diagnostics / Procedures:     Radiology No results found.  Procedures Procedures (including critical care time) EKG  Pending results:  Labs Reviewed  POCT URINALYSIS DIP (MANUAL ENTRY) - Abnormal; Notable for the following components:      Result Value   Spec Grav, UA >=1.030 (*)    Protein Ur, POC =30 (*)    Leukocytes, UA Small (1+) (*)    All other components within normal limits  URINE CULTURE  POCT URINALYSIS DIP (MANUAL ENTRY)  CERVICOVAGINAL ANCILLARY ONLY    Medications Ordered in UC: Medications - No data to display  UC Diagnoses / Final Clinical Impressions(s)   I have reviewed the  triage vital signs and the nursing notes.  Pertinent labs & imaging results that were available during my care of the patient were reviewed by me and considered in my medical decision making (see chart for details).    Final diagnoses:  Cystitis  Rash and nonspecific skin eruption   Patient being treated empirically for Bactrim for presumed urinary tract infection, urine culture pending, will adjust treatment based on results of culture.  Patient advised to monitor rash since it just appeared today, is not itchy and is not painful.  Patient advised to reach out to [name] if it worsens or becomes uncomfortable.  Return precautions advised.  ED Prescriptions     Medication Sig Dispense Auth. Provider   sulfamethoxazole-trimethoprim (BACTRIM DS) 800-160 MG tablet Take 1 tablet by mouth 2 (two) times daily for 5 days. 10 tablet Lynden Oxford Scales, PA-C      PDMP not reviewed this encounter.  Pending results:  Labs Reviewed  POCT URINALYSIS DIP (MANUAL ENTRY) - Abnormal; Notable for the following components:      Result Value   Spec Grav, UA >=1.030 (*)    Protein Ur, POC =30 (*)    Leukocytes, UA Small (1+) (*)    All other components within normal limits  URINE CULTURE  POCT URINALYSIS DIP (MANUAL ENTRY)  CERVICOVAGINAL ANCILLARY ONLY    Discharge Instructions:   Discharge Instructions      Common causes of urinary tract infections include but are not limited to holding your urine longer than you should, squatting instead of sitting down when urinating, sitting around in wet clothing such as a wet swimsuit or gym clothes too long, not emptying your bladder after having sexual intercourse, wiping from back to front instead of front to back after having a bowel movement.  Less common causes of urinary tract infections include but are not limited to anatomical shifts in the location of your bladder or uterus causing obstruction of passage of urine from your bladder to your urethra  where your urine comes out or prolapse of your rectum into your vaginal wall.  These less common causes can be evaluated by gynecologist, a urologist or subspecialist called a uro-gynecologist   The urinalysis that we performed in the clinic today was abnormal.  Urine culture will be performed per our protocol.  The result of the urine culture will be available in the next 3 to 5 days and will be posted to your MyChart account.  If there is an abnormal finding, you will be contacted by phone and advised of further treatment recommendations, if any.   You were advised to begin antibiotics today because your urinalysis is abnormal and you are having active symptoms of an acute lower urinary tract infection also known as cystitis.  It is very important that you take all doses exactly as prescribed.  Incomplete antibiotic therapy can cause worsening urinary tract infection that can become aggressive, escape from  urinary tract into your bloodstream causing sepsis which will require hospitalization.  Please pick up and begin taking your prescription for Bactrim DS (trimethoprim sulfamethoxazole) as soon as possible.  Please take all doses exactly as prescribed.  You can take this medication with or without food.  This medication is safe to take with your other medications.   If you have not had complete resolution of your symptoms after completing treatment as prescribed, please return to urgent care for repeat evaluation or follow-up with your primary care provider.  Please keep an eye on the rash on your left upper thigh.  If it becomes more itchy or painful, please let us know.   Thank you for visiting urgent care today.  I appreciate the opportunity to participate in your care.       Disposition Upon Discharge:  Condition: stable for discharge home  Patient presented with an acute illness with associated systemic symptoms and significant discomfort requiring urgent management. In my opinion, this  is a condition that a prudent lay person (someone who possesses an average knowledge of health and medicine) may potentially expect to result in complications if not addressed urgently such as respiratory distress, impairment of bodily function or dysfunction of bodily organs.   Routine symptom specific, illness specific and/or disease specific instructions were discussed with the patient and/or caregiver at length.   As such, the patient has been evaluated and assessed, work-up was performed and treatment was provided in alignment with urgent care protocols and evidence based medicine.  Patient/parent/caregiver has been advised that the patient may require follow up for further testing and treatment if the symptoms continue in spite of treatment, as clinically indicated and appropriate.  Patient/parent/caregiver has been advised to return to the [facility] or PCP if no better; to PCP or the Emergency Department if new signs and symptoms develop, or if the current signs or symptoms continue to change or worsen for further workup, evaluation and treatment as clinically indicated and appropriate  The patient will follow up with their current PCP if and as advised. If the patient does not currently have a PCP we will assist them in obtaining one.   The patient may need specialty follow up if the symptoms continue, in spite of conservative treatment and management, for further workup, evaluation, consultation and treatment as clinically indicated and appropriate.   Patient/parent/caregiver verbalized understanding and agreement of plan as discussed.  All questions were addressed during visit.  Please see discharge instructions below for further details of plan.  This office note has been dictated using Museum/gallery curator.  Unfortunately, this method of dictation can sometimes lead to typographical or grammatical errors.  I apologize for your inconvenience in advance if this occurs.  Please do not  hesitate to reach out to me if clarification is needed.      Lynden Oxford Scales, PA-C 06/05/22 1407

## 2022-06-05 NOTE — Discharge Instructions (Addendum)
Common causes of urinary tract infections include but are not limited to holding your urine longer than you should, squatting instead of sitting down when urinating, sitting around in wet clothing such as a wet swimsuit or gym clothes too long, not emptying your bladder after having sexual intercourse, wiping from back to front instead of front to back after having a bowel movement.  Less common causes of urinary tract infections include but are not limited to anatomical shifts in the location of your bladder or uterus causing obstruction of passage of urine from your bladder to your urethra where your urine comes out or prolapse of your rectum into your vaginal wall.  These less common causes can be evaluated by gynecologist, a urologist or subspecialist called a uro-gynecologist   The urinalysis that we performed in the clinic today was abnormal.  Urine culture will be performed per our protocol.  The result of the urine culture will be available in the next 3 to 5 days and will be posted to your MyChart account.  If there is an abnormal finding, you will be contacted by phone and advised of further treatment recommendations, if any.   You were advised to begin antibiotics today because your urinalysis is abnormal and you are having active symptoms of an acute lower urinary tract infection also known as cystitis.  It is very important that you take all doses exactly as prescribed.  Incomplete antibiotic therapy can cause worsening urinary tract infection that can become aggressive, escape from urinary tract into your bloodstream causing sepsis which will require hospitalization.  Please pick up and begin taking your prescription for Bactrim DS (trimethoprim sulfamethoxazole) as soon as possible.  Please take all doses exactly as prescribed.  You can take this medication with or without food.  This medication is safe to take with your other medications.   If you have not had complete resolution of your  symptoms after completing treatment as prescribed, please return to urgent care for repeat evaluation or follow-up with your primary care provider.  Please keep an eye on the rash on your left upper thigh.  If it becomes more itchy or painful, please let us know.   Thank you for visiting urgent care today.  I appreciate the opportunity to participate in your care.

## 2022-06-05 NOTE — ED Notes (Signed)
Currently still awaiting urine sample from patient.

## 2022-06-06 LAB — CERVICOVAGINAL ANCILLARY ONLY
Bacterial Vaginitis (gardnerella): POSITIVE — AB
Candida Glabrata: NEGATIVE
Candida Vaginitis: NEGATIVE
Chlamydia: NEGATIVE
Comment: NEGATIVE
Comment: NEGATIVE
Comment: NEGATIVE
Comment: NEGATIVE
Comment: NEGATIVE
Comment: NORMAL
Neisseria Gonorrhea: NEGATIVE
Trichomonas: NEGATIVE

## 2022-06-07 LAB — URINE CULTURE: Culture: >=100000 — AB

## 2022-06-09 ENCOUNTER — Telehealth (HOSPITAL_COMMUNITY): Payer: Self-pay | Admitting: Emergency Medicine

## 2022-06-09 MED ORDER — METRONIDAZOLE 500 MG PO TABS: 500.0000 mg | ORAL_TABLET | Freq: Two times a day (BID) | ORAL | 0 refills | Status: DC

## 2022-06-10 ENCOUNTER — Encounter (HOSPITAL_COMMUNITY): Payer: Self-pay

## 2022-06-10 ENCOUNTER — Emergency Department (HOSPITAL_COMMUNITY)
Admission: EM | Admit: 2022-06-10 | Discharge: 2022-06-10 | Disposition: A | Discharge status: Home / Self Care | Payer: BC Managed Care – PPO | Attending: Emergency Medicine | Admitting: Emergency Medicine

## 2022-06-10 ENCOUNTER — Other Ambulatory Visit: Payer: Self-pay

## 2022-06-10 DIAGNOSIS — Z7984 Long term (current) use of oral hypoglycemic drugs: Secondary | ICD-10-CM | POA: Insufficient documentation

## 2022-06-10 DIAGNOSIS — I2510 Atherosclerotic heart disease of native coronary artery without angina pectoris: Secondary | ICD-10-CM | POA: Diagnosis not present

## 2022-06-10 DIAGNOSIS — E119 Type 2 diabetes mellitus without complications: Secondary | ICD-10-CM | POA: Diagnosis not present

## 2022-06-10 DIAGNOSIS — Z79899 Other long term (current) drug therapy: Secondary | ICD-10-CM | POA: Diagnosis not present

## 2022-06-10 DIAGNOSIS — I10 Essential (primary) hypertension: Secondary | ICD-10-CM | POA: Diagnosis not present

## 2022-06-10 DIAGNOSIS — Z7982 Long term (current) use of aspirin: Secondary | ICD-10-CM | POA: Insufficient documentation

## 2022-06-10 DIAGNOSIS — R21 Rash and other nonspecific skin eruption: Secondary | ICD-10-CM | POA: Diagnosis not present

## 2022-06-10 DIAGNOSIS — E1142 Type 2 diabetes mellitus with diabetic polyneuropathy: Secondary | ICD-10-CM

## 2022-06-10 MED ORDER — KETOROLAC TROMETHAMINE 15 MG/ML IJ SOLN
15.0000 mg | Freq: Once | INTRAMUSCULAR | Status: AC
  Administered 2022-06-10: 15 mg via INTRAMUSCULAR
  Filled 2022-06-10: qty 1

## 2022-06-10 MED ORDER — CETIRIZINE HCL 10 MG PO TABS: 10.0000 mg | ORAL_TABLET | Freq: Every day | ORAL | 0 refills | Status: DC

## 2022-06-10 MED ORDER — HYDROCORTISONE 1 % EX CREA: TOPICAL_CREAM | CUTANEOUS | 0 refills | Status: DC

## 2022-06-10 MED ORDER — GABAPENTIN 100 MG PO CAPS: 100.0000 mg | ORAL_CAPSULE | Freq: Three times a day (TID) | ORAL | 0 refills | Status: DC

## 2022-06-10 NOTE — ED Provider Triage Note (Signed)
Emergency Medicine Provider Triage Evaluation Note  MESHIA RAU , a 70 y.o. female  was evaluated in triage.  Pt complains of rash that has been present for the past week.  Denies new medications or new products. She was placed on Bactrim on 8/17 for UTI; however rash was present before then. Patient notes rash occasionally itches.  No fever or chills. No animals at home. She lives alone  Review of Systems  Positive: rash Negative: fever  Physical Exam  BP (!) 131/92   Pulse 68   Temp 97.8 F (36.6 C) (Oral)   Resp 16   SpO2 95%  Gen:   Awake, no distress   Resp:  Normal effort  MSK:   Moves extremities without difficulty  Other:    Medical Decision Making  Medically screening exam initiated at 1:09 PM.  Appropriate orders placed.  CLARIVEL CALLAWAY was informed that the remainder of the evaluation will be completed by another provider, this initial triage assessment does not replace that evaluation, and the importance of remaining in the ED until their evaluation is complete.  Rash   Suzy Bouchard, [place] 06/10/22 1315

## 2022-06-10 NOTE — ED Provider Notes (Signed)
[facility] Provider Note   CSN: [id number] Arrival date & time: 06/10/22  1235     History  Chief Complaint  Patient presents with   Rash    Tammy REINERTSEN is a 71 y.o. female with a past medical history of CAD, hypertension, type 2 diabetes and peripheral neuropathy presenting today with a rash.  She reports that on Thursday she noted an itching and burning rash all over her body.  She went to urgent care and they diagnosed her with a UTI but told her to just monitor the rash.  She comes in today saying that her entire body is achy and itchy.  No new detergents, pets, shampoos or lotions.  She lives alone.  She initially believes she may have been bit by a spider however she says that she spends no time outside and does not know how any bugs would have bit her.   Rash      Home Medications Prior to Admission medications   Medication Sig Start Date End Date Taking? Authorizing Provider  gabapentin (NEURONTIN) 100 MG capsule Take 1 capsule (100 mg total) by mouth 3 (three) times daily. 06/10/22  Yes Ayodeji Keimig A, PA-C  ACCU-CHEK AVIVA PLUS test strip USE AS INSTRUCTED 01/16/21   Charlott Rakes, MD  Accu-Chek FastClix Lancets MISC USE AS INSTRUCTED 10/01/21   Charlott Rakes, MD  Accu-Chek FastClix Lancets MISC Use daily as directed 10/01/21   Charlott Rakes, MD  ACCU-CHEK GUIDE test strip USE AS INSTRUCTED 09/08/20   Charlott Rakes, MD  acetaminophen-codeine (TYLENOL #3) 300-30 MG tablet Take 1 tablet by mouth every 12 (twelve) hours as needed for moderate pain. 05/27/22   Charlott Rakes, MD  amLODipine (NORVASC) 5 MG tablet Take 1 tablet (5 mg total) by mouth daily. 05/27/22   Charlott Rakes, MD  aspirin EC 81 MG tablet Take 81 mg by mouth in the morning. Swallow whole.    [provider]  atorvastatin (LIPITOR) 10 MG tablet TAKE 1 TABLET(10 MG) BY MOUTH DAILY 12/04/21   Charlott Rakes, MD  Blood Glucose Monitoring Suppl  (ACCU-CHEK GUIDE ME) w/Device KIT 1 kit by Does not apply route daily. Use to check blood sugar daily. 01/21/19   Charlott Rakes, MD  carboxymethylcellul-glycerin (LUBRICANT DROPS/DUAL-ACTION) 0.5-0.9 % ophthalmic solution Place 1-2 drops into both eyes 3 (three) times daily as needed for dry eyes.    [provider]  carvedilol (COREG) 12.5 MG tablet TAKE 1 TABLET(12.5 MG) BY MOUTH TWICE DAILY WITH A MEAL 05/27/22   Charlott Rakes, MD  dapagliflozin propanediol (FARXIGA) 10 MG TABS tablet Take 1 tablet (10 mg total) by mouth daily before breakfast. 05/27/22   Charlott Rakes, MD  gabapentin (NEURONTIN) 300 MG capsule TAKE 1 CAPSULE(300 MG) BY MOUTH AT BEDTIME. 05/27/22   Charlott Rakes, MD  glipiZIDE (GLIPIZIDE XL) 10 MG 24 hr tablet Take 1 tablet (10 mg total) by mouth daily with breakfast. 05/27/22   Charlott Rakes, MD  glucose blood test strip Use as instructed 01/24/21   Charlott Rakes, MD  metroNIDAZOLE (FLAGYL) 500 MG tablet Take 1 tablet (500 mg total) by mouth 2 (two) times daily. 06/09/22   Lamptey, Myrene Galas, MD  Misc. Devices MISC Shower bench, Above the toilet seat.  Diagnosis- Type 2 Diabetes Mellitus 02/22/20   Charlott Rakes, MD  Multiple Vitamin (MULTIVITAMIN WITH MINERALS) TABS tablet Take 1 tablet by mouth in the morning.    [provider]  nicotine (NICODERM CQ - DOSED  IN MG/24 HOURS) 14 mg/24hr patch Place 1 patch (14 mg total) onto the skin daily. 03/04/22   Burnell Blanks, MD  nitroGLYCERIN (NITROSTAT) 0.4 MG SL tablet Place 0.4 mg under the tongue every 5 (five) minutes as needed for chest pain.    [provider]  oxyCODONE-acetaminophen (PERCOCET/ROXICET) 5-325 MG tablet Take 1 tablet by mouth every 4 (four) hours as needed for moderate pain or severe pain. 03/06/21   Dagoberto Ligas, PA-C  PRESCRIPTION MEDICATION Apply 1 application topically in the morning and at bedtime. Kamea Foot Cream    [provider]  sulfamethoxazole-trimethoprim  (BACTRIM DS) 800-160 MG tablet Take 1 tablet by mouth 2 (two) times daily for 5 days. 06/05/22 06/10/22  Lynden Oxford Scales, PA-C      Allergies    Benadryl [diphenhydramine hcl (sleep)], Rosuvastatin, and Simvastatin    Review of Systems   Review of Systems  Skin:  Positive for rash.    Physical Exam Updated Vital Signs BP (!) 131/92   Pulse 68   Temp 97.8 F (36.6 C) (Oral)   Resp 16   SpO2 95%  Physical Exam Vitals and nursing note reviewed.  Constitutional:      Appearance: Normal appearance.  HENT:     Head: Normocephalic and atraumatic.  Eyes:     General: No scleral icterus.    Conjunctiva/sclera: Conjunctivae normal.  Pulmonary:     Effort: Pulmonary effort is normal. No respiratory distress.  Skin:    Findings: No rash.     Comments: Patient has multiple clusters of erythematous papules in different parts of her body.  The majority of them are localized to her left hip however she has scattered papules, some with scabs, on the bilateral lower extremities, anterior thighs and back.  Neurological:     Mental Status: She is alert.  Psychiatric:        Mood and Affect: Mood normal.     ED Results / Procedures / Treatments   Labs (all labs ordered are listed, but only abnormal results are displayed) Labs Reviewed - No data to display  EKG None  Radiology No results found.  Procedures Procedures   Medications Ordered in ED Medications  ketorolac (TORADOL) 15 MG/ML injection 15 mg (has no administration in time range)    ED Course/ Medical Decision Making/ A&P                           Medical Decision Making Risk Prescription drug management.   71 year old female presenting with a rash.  Differential includes but is not limited to contact dermatitis, bug bite, shingles, eczema, psoriasis.  This is not exhaustive.  Chart review: Reviewed patient's urgent care note.  The visit appeared to better address her urinary tract infection than her  rash.  Exam: Patient has multiple clusters of erythematous papules in different parts of her body.  The majority of them are localized to her left hip however she has scattered papules, some with scabs, on the bilateral lower extremities, anterior thighs and back.  Treatment: Patient says that she is allergic to Benadryl, reporting swelling and throat tightness.  She continues to have pain so she will be given Toradol.  I do not believe that her presentation is consistent with shingles.  Her rash is somewhat consistent with bedbugs however she swears that there it is no way that she has bedbugs at her house.  She says that she has taken Zyrtec  in the past despite a Benadryl allergy.  She will use this antihistamine for any itching.  We discussed topical medication such as hydrocortisone and over-the-counter pain options.  She also has been given a referral to dermatology for any further concerns.  She is in agreement with the plan.  I have sent gabapentin to her pharmacy for any potential possible neuropathic pain from a shingles infection despite this not being consistent with shingles.  She reports nerve pain in the bilateral lower extremities that may be from her peripheral neuropathy.  She is agreeable to the plan of gabapentin, Zyrtec as an antihistamine, hydrocortisone as a topical steroid and over-the-counter pain control.  Final Clinical Impression(s) / ED Diagnoses Final diagnoses:  Rash    Rx / DC Orders ED Discharge Orders          Ordered    gabapentin (NEURONTIN) 100 MG capsule  3 times daily        06/10/22 1550    cetirizine (ZYRTEC ALLERGY) 10 MG tablet  Daily        06/10/22 1600    hydrocortisone cream 1 %        06/10/22 1600           Results and diagnoses were explained to the patient. Return precautions discussed in full. Patient had no additional questions and expressed complete understanding.   This chart was dictated using voice recognition software.  Despite  best efforts to proofread,  errors can occur which can change the documentation meaning.    Darliss Ridgel 06/10/22 1609    Godfrey Pick, MD 06/11/22 [phone]

## 2022-06-10 NOTE — Discharge Instructions (Addendum)
I have sent the following medications to your pharmacy for your rash: Gabapentin is for nerve pain.  I have decreased your dosage from what it was previously.  It may make you sleepy, do not drive if it makes you drowsy. Cetirizine which is Zyrtec.  You said that you took Zyrtec in the past so this should be safe to take however it is somewhat similar to Benadryl so monitor for any potential reactions to return as necessary Hydrocortisone is a cream that function is a steroid to help your area heal.  Use this after washing your skin with mild soap.  You may also use Vaseline but avoid any lotions with scents.  Your rash is somewhat suspicious of bedbugs however I understand you do not believe there are any bedbugs in your home.  Regardless, wash all of your linens with hot water and detergent and thoroughly cleaned any carpeted areas.  Ultimately, please follow-up with dermatology.  Your primary care should also be able to address your rash.  Return to the emergency department with any difficulty breathing, oral swelling, inability to swallow or other worsening symptoms.  It was pleasure to meet you I hope you feel better

## 2022-06-10 NOTE — ED Triage Notes (Signed)
Pt arrived POV from home c/o a rash since last week. Pt has spots on her thigh that looks like they were open and are starting to heal. Pt states sometimes they itch but she needs to know what they are.

## 2022-06-12 ENCOUNTER — Encounter: Payer: Self-pay | Admitting: Orthopaedic Surgery

## 2022-06-12 ENCOUNTER — Ambulatory Visit (INDEPENDENT_AMBULATORY_CARE_PROVIDER_SITE_OTHER): Payer: BC Managed Care – PPO | Admitting: Orthopaedic Surgery

## 2022-06-12 DIAGNOSIS — G8929 Other chronic pain: Secondary | ICD-10-CM

## 2022-06-12 DIAGNOSIS — M25562 Pain in left knee: Secondary | ICD-10-CM | POA: Diagnosis not present

## 2022-06-12 DIAGNOSIS — M1712 Unilateral primary osteoarthritis, left knee: Secondary | ICD-10-CM | POA: Diagnosis not present

## 2022-06-12 NOTE — Progress Notes (Signed)
The patient is well-known to me.  She has well-documented severe arthritis in her left knee.  I did see her last month and took fluid off of her knee and placed a steroid injection of the knee.  We have seen her multiple times for this knee.  At this point her left knee pain is daily and it is detrimentally affecting her mobility, her quality of life and activities day living.  She wishes to proceed with knee replacement surgery when she feels like her health is better.  She has been dealing with a rash for some time now.  She is diabetic but under good control.  She is to see her primary care physician soon.  I agree with her proceeding with knee replacement surgery when she feels that she is ready to have this done which I think she is but she wants to make sure everything is fine with her health.  From my standpoint the knee is swollen on my exam today.  She has limited range of motion as a relates to that knee.  Previous x-rays confirm severe end-stage arthritis of her left knee.  We have talked in length in detail about knee replacement surgery and she has expressed interest in having this in the past.  She had wanted to wait until the first of the year but now she is feels like she needs to proceed sooner than later.  I have recommended she see her primary care physician for clearance for surgery and then call us to have the surgery scheduled for a left total knee arthroplasty.

## 2022-07-28 ENCOUNTER — Other Ambulatory Visit: Payer: Self-pay | Admitting: Family Medicine

## 2022-07-28 DIAGNOSIS — E1159 Type 2 diabetes mellitus with other circulatory complications: Secondary | ICD-10-CM

## 2022-08-05 ENCOUNTER — Other Ambulatory Visit: Payer: Self-pay

## 2022-08-05 ENCOUNTER — Ambulatory Visit: Payer: BC Managed Care – PPO | Attending: Family Medicine | Admitting: Family Medicine

## 2022-08-05 ENCOUNTER — Ambulatory Visit: Payer: BC Managed Care – PPO | Admitting: Family Medicine

## 2022-08-05 ENCOUNTER — Encounter: Payer: Self-pay | Admitting: Family Medicine

## 2022-08-05 VITALS — BP 154/76 | HR 68 | Wt 156.8 lb

## 2022-08-05 DIAGNOSIS — Z01818 Encounter for other preprocedural examination: Secondary | ICD-10-CM

## 2022-08-05 DIAGNOSIS — I52 Other heart disorders in diseases classified elsewhere: Secondary | ICD-10-CM | POA: Insufficient documentation

## 2022-08-05 DIAGNOSIS — M1712 Unilateral primary osteoarthritis, left knee: Secondary | ICD-10-CM | POA: Diagnosis not present

## 2022-08-05 DIAGNOSIS — I739 Peripheral vascular disease, unspecified: Secondary | ICD-10-CM | POA: Diagnosis not present

## 2022-08-05 DIAGNOSIS — I152 Hypertension secondary to endocrine disorders: Secondary | ICD-10-CM

## 2022-08-05 DIAGNOSIS — E1142 Type 2 diabetes mellitus with diabetic polyneuropathy: Secondary | ICD-10-CM | POA: Insufficient documentation

## 2022-08-05 DIAGNOSIS — E1169 Type 2 diabetes mellitus with other specified complication: Secondary | ICD-10-CM

## 2022-08-05 DIAGNOSIS — E785 Hyperlipidemia, unspecified: Secondary | ICD-10-CM

## 2022-08-05 DIAGNOSIS — E1159 Type 2 diabetes mellitus with other circulatory complications: Secondary | ICD-10-CM | POA: Diagnosis not present

## 2022-08-05 LAB — GLUCOSE, POCT (MANUAL RESULT ENTRY): POC Glucose: 78 mg/dl (ref 70–99)

## 2022-08-05 MED ORDER — GLIPIZIDE ER 10 MG PO TB24: 10.0000 mg | ORAL_TABLET | Freq: Every day | ORAL | 1 refills | Status: DC

## 2022-08-05 MED ORDER — ATORVASTATIN CALCIUM 10 MG PO TABS: ORAL_TABLET | ORAL | 1 refills | Status: DC

## 2022-08-05 MED ORDER — GABAPENTIN 300 MG PO CAPS: ORAL_CAPSULE | ORAL | 1 refills | Status: DC

## 2022-08-05 MED ORDER — GABAPENTIN 100 MG PO CAPS: 100.0000 mg | ORAL_CAPSULE | Freq: Three times a day (TID) | ORAL | 3 refills | Status: DC

## 2022-08-05 MED ORDER — CARVEDILOL 12.5 MG PO TABS: ORAL_TABLET | ORAL | 1 refills | Status: DC

## 2022-08-05 MED ORDER — LOSARTAN POTASSIUM 25 MG PO TABS: 25.0000 mg | ORAL_TABLET | Freq: Every day | ORAL | 1 refills | Status: DC

## 2022-08-05 MED ORDER — ACETAMINOPHEN-CODEINE 300-30 MG PO TABS: 1.0000 | ORAL_TABLET | Freq: Two times a day (BID) | ORAL | 1 refills | Status: DC | PRN

## 2022-08-05 MED ORDER — DAPAGLIFLOZIN PROPANEDIOL 10 MG PO TABS: 10.0000 mg | ORAL_TABLET | Freq: Every day | ORAL | 1 refills | Status: DC

## 2022-08-05 NOTE — Progress Notes (Signed)
Subjective:  Patient ID: Tammy Graham, female    DOB: [birth year]  Age: 71 y.o. MRN: [id number]  CC: Medication Refill and Diabetes   HPI Tammy Graham is a 71 y.o. year old female with a history of coronary artery disease (status post DES), type 2 diabetes mellitus (A1c 6.7), hypertension, PAD (s/p right common femoral to above-knee popliteal bypass in 02/2021), L knee OA, tobacco abuse who presents today for chronic disease management.  Interval History:  Seen by orthopedics in 05/2022 for left knee pain with plans for left total knee arthroplasty.  She requires a preoperative clearance. In the absence of her knee pain she is able to walk up two flights of stairs.  She is very active and works 2 jobs. After her surgery she hopes to receive home care through her insurance company but states she does not have any family who will check on her as she would not like anyone to do so.  She wanted to come off Metformin and it was replaced with Wilder Glade but she complains of the pharmacy cost of $500.  Also complains that prescription for Lipitor cost $100 and she is unable to understand this as she has [employer].. Denies presence of neuropathy or visual concerns.   She Complains of breaking out in a rash with Amlodipine.  This was discovered after she completed the process of elimination of her medications and so she stopped taking amlodipine.  She has continued with carvedilol for hypertension. Past Medical History:  Diagnosis Date   Anxiety    Arthritis    shoulder and knees   CAD (coronary artery disease)    Complication of anesthesia    states she was still awake one time when surgery started   DM (diabetes mellitus) ([place])    HTN (hypertension)    Hyperlipemia    Myocardial infarction [facility])    Peripheral vascular disease ([place])    Pneumonia    Sleep apnea    no cpap   Stroke [facility])    2012    Past Surgical History:  Procedure Laterality Date   ABDOMINAL  AORTOGRAM W/LOWER EXTREMITY Bilateral 02/11/2021   Procedure: ABDOMINAL AORTOGRAM W/LOWER EXTREMITY;  Surgeon: Waynetta Sandy, MD;  Location: [facility];  Service: Cardiovascular;  Laterality: Bilateral;   CARDIAC CATHETERIZATION     COLONOSCOPY     COLONOSCOPY  09/25/2011   Procedure: COLONOSCOPY;  Surgeon: Winfield Cunas., MD;  Location: [facility];  Service: Endoscopy;  Laterality: N/A;   CORONARY STENT PLACEMENT     Ostial LAD stent in 2012   FEMORAL-POPLITEAL BYPASS GRAFT Right 02/27/2021   Procedure: RIGHT COMMON FEMORAL- ABOVE KNEE POPLITEAL ARTERY BYPASS GRAFT;  Surgeon: Waynetta Sandy, MD;  Location: [place];  Service: Vascular;  Laterality: Right;   Left knee surgery      Family History  Problem Relation Age of Onset   Heart attack Mother    Anesthesia problems Neg Hx    Hypotension Neg Hx    Malignant hyperthermia Neg Hx    Pseudochol deficiency Neg Hx     Social History   Socioeconomic History   Marital status: Widowed    Spouse name: Not on file   Number of children: Not on file   Years of education: Not on file   Highest education level: Not on file  Occupational History   Not on file  Tobacco Use   Smoking status: Former    Packs/day:  0.25    Years: 15.00    Total pack years: 3.75    Types: Cigarettes    Quit date: 02/27/2021    Years since quitting: 1.4   Smokeless tobacco: Never   Tobacco comments:    4-5 daily  Vaping Use   Vaping Use: Never used  Substance and Sexual Activity   Alcohol use: Yes    Alcohol/week: 1.0 standard drink of alcohol    Types: 1 Shots of liquor per week    Comment: occasionally    Drug use: No   Sexual activity: Not Currently    Birth control/protection: None  Other Topics Concern   Not on file  Social History Narrative   Not on file   Social Determinants of Health   Financial Resource Strain: Not on file  Food Insecurity: Not on file  Transportation Needs: Not on file  Physical  Activity: Not on file  Stress: Not on file  Social Connections: Not on file    Allergies  Allergen Reactions   Benadryl [Diphenhydramine Hcl (Sleep)] Other (See Comments)    unknown   Rosuvastatin Hives    Patient is allergic to generic rosuvastatin. She says she is able to take branded Crestor.    Simvastatin Other (See Comments)    unknown    Outpatient Medications Prior to Visit  Medication Sig Dispense Refill   ACCU-CHEK AVIVA PLUS test strip USE AS INSTRUCTED 100 strip 0   Accu-Chek FastClix Lancets MISC USE AS INSTRUCTED 102 each 11   Accu-Chek FastClix Lancets MISC Use daily as directed 102 each 3   ACCU-CHEK GUIDE test strip USE AS INSTRUCTED 100 strip 1   acetaminophen-codeine (TYLENOL #3) 300-30 MG tablet Take 1 tablet by mouth every 12 (twelve) hours as needed for moderate pain. 60 tablet 1   aspirin EC 81 MG tablet Take 81 mg by mouth in the morning. Swallow whole.     Blood Glucose Monitoring Suppl (ACCU-CHEK GUIDE ME) w/Device KIT 1 kit by Does not apply route daily. Use to check blood sugar daily. 1 kit 0   carboxymethylcellul-glycerin (LUBRICANT DROPS/DUAL-ACTION) 0.5-0.9 % ophthalmic solution Place 1-2 drops into both eyes 3 (three) times daily as needed for dry eyes.     cetirizine (ZYRTEC ALLERGY) 10 MG tablet Take 1 tablet (10 mg total) by mouth daily. 14 tablet 0   glucose blood test strip Use as instructed 100 each 12   hydrocortisone cream 1 % Apply to affected area 2 times daily 15 g 0   metroNIDAZOLE (FLAGYL) 500 MG tablet Take 1 tablet (500 mg total) by mouth 2 (two) times daily. 14 tablet 0   Misc. Devices MISC Shower bench, Above the toilet seat.  Diagnosis- Type 2 Diabetes Mellitus 1 each 0   Multiple Vitamin (MULTIVITAMIN WITH MINERALS) TABS tablet Take 1 tablet by mouth in the morning.     nicotine (NICODERM CQ - DOSED IN MG/24 HOURS) 14 mg/24hr patch Place 1 patch (14 mg total) onto the skin daily. 28 patch 0   nitroGLYCERIN (NITROSTAT) 0.4 MG SL tablet  Place 0.4 mg under the tongue every 5 (five) minutes as needed for chest pain.     oxyCODONE-acetaminophen (PERCOCET/ROXICET) 5-325 MG tablet Take 1 tablet by mouth every 4 (four) hours as needed for moderate pain or severe pain. 30 tablet 0   PRESCRIPTION MEDICATION Apply 1 application topically in the morning and at bedtime. Kamea Foot Cream     amLODipine (NORVASC) 5 MG tablet Take 1 tablet (5  mg total) by mouth daily. 90 tablet 1   atorvastatin (LIPITOR) 10 MG tablet TAKE 1 TABLET(10 MG) BY MOUTH DAILY 90 tablet 3   carvedilol (COREG) 12.5 MG tablet TAKE 1 TABLET(12.5 MG) BY MOUTH TWICE DAILY WITH A MEAL 180 tablet 1   dapagliflozin propanediol (FARXIGA) 10 MG TABS tablet Take 1 tablet (10 mg total) by mouth daily before breakfast. 90 tablet 1   gabapentin (NEURONTIN) 100 MG capsule Take 1 capsule (100 mg total) by mouth 3 (three) times daily. 30 capsule 0   gabapentin (NEURONTIN) 300 MG capsule TAKE 1 CAPSULE(300 MG) BY MOUTH AT BEDTIME. 90 capsule 1   glipiZIDE (GLIPIZIDE XL) 10 MG 24 hr tablet Take 1 tablet (10 mg total) by mouth daily with breakfast. 90 tablet 1   No facility-administered medications prior to visit.     ROS Review of Systems  Constitutional:  Negative for activity change and appetite change.  HENT:  Negative for sinus pressure and sore throat.   Respiratory:  Negative for chest tightness, shortness of breath and wheezing.   Cardiovascular:  Negative for chest pain and palpitations.  Gastrointestinal:  Negative for abdominal distention, abdominal pain and constipation.  Genitourinary: Negative.   Musculoskeletal:        See HPI  Psychiatric/Behavioral:  Negative for behavioral problems and dysphoric mood.     Objective:  BP (!) 154/76   Pulse 68   Wt 156 lb 12.8 oz (71.1 kg)   SpO2 98%   BMI 25.31 kg/m      08/05/2022   11:31 AM 06/10/2022   12:48 PM 06/05/2022   12:56 PM  BP/Weight  Systolic BP 800 349 179  Diastolic BP 76 92 78  Wt. (Lbs) 156.8     BMI 25.31 kg/m2      Wt Readings from Last 3 Encounters:  08/05/22 156 lb 12.8 oz (71.1 kg)  05/27/22 157 lb 9.6 oz (71.5 kg)  03/04/22 157 lb 3.2 oz (71.3 kg)      Physical Exam Constitutional:      Appearance: She is well-developed.  Cardiovascular:     Rate and Rhythm: Normal rate.     Heart sounds: Normal heart sounds. No murmur heard. Pulmonary:     Effort: Pulmonary effort is normal.     Breath sounds: Normal breath sounds. No wheezing or rales.  Chest:     Chest wall: No tenderness.  Abdominal:     General: Bowel sounds are normal. There is no distension.     Palpations: Abdomen is soft. There is no mass.     Tenderness: There is no abdominal tenderness.  Musculoskeletal:     Right lower leg: No edema.     Left lower leg: Edema (L knee) present.     Comments: Left knee edema with associated tenderness on range of motion  Neurological:     Mental Status: She is alert and oriented to person, place, and time.  Psychiatric:        Mood and Affect: Mood normal.        Latest Ref Rng & Units 05/27/2022   11:09 AM 10/01/2021   10:35 AM 02/28/2021    1:55 AM  CMP  Glucose 70 - 99 mg/dL 159  116  264   BUN 8 - 27 mg/dL 13  12  10    Creatinine 0.57 - 1.00 mg/dL 0.97  1.10  1.04   Sodium 134 - 144 mmol/L 142  143  136   Potassium 3.5 -  5.2 mmol/L 4.4  4.3  4.3   Chloride 96 - 106 mmol/L 103  102  102   CO2 20 - 29 mmol/L 26  26  26    Calcium 8.7 - 10.3 mg/dL 10.5  10.8  9.1   Total Protein 6.0 - 8.5 g/dL 6.8  7.1    Total Bilirubin 0.0 - 1.2 mg/dL 0.8  0.8    Alkaline Phos 44 - 121 IU/L 88  91    AST 0 - 40 IU/L 26  21    ALT 0 - 32 IU/L 23  18      Lipid Panel     Component Value Date/Time   CHOL 130 10/01/2021 1035   TRIG 73 10/01/2021 1035   HDL 55 10/01/2021 1035   CHOLHDL 2.2 02/28/2021 0155   VLDL 12 02/28/2021 0155   LDLCALC 60 10/01/2021 1035    CBC    Component Value Date/Time   WBC 13.5 (H) 02/28/2021 0155   RBC 3.61 (L) 02/28/2021 0155    HGB 11.8 (L) 02/28/2021 0155   HGB 15.3 07/01/2019 1550   HCT 34.4 (L) 02/28/2021 0155   HCT 46.5 07/01/2019 1550   PLT 193 02/28/2021 0155   PLT 257 07/01/2019 1550   MCV 95.3 02/28/2021 0155   MCV 98 (H) 07/01/2019 1550   MCH 32.7 02/28/2021 0155   MCHC 34.3 02/28/2021 0155   RDW 12.7 02/28/2021 0155   RDW 12.6 07/01/2019 1550   LYMPHSABS 2.9 09/11/2016 2316   MONOABS 0.4 09/11/2016 2316   EOSABS 0.2 09/11/2016 2316   BASOSABS 0.0 09/11/2016 2316    Lab Results  Component Value Date   HGBA1C 6.9 05/27/2022    Assessment & Plan:  1. Type 2 diabetes mellitus with diabetic polyneuropathy, without long-term current use of insulin (HCC) Controlled with A1c of 6.9 I have spoken with our pharmacist and we will proceed with prior authorization for Farxiga Continue current regimen Counseled on Diabetic diet, my plate method, 983 minutes of moderate intensity exercise/week Blood sugar logs with fasting goals of 80-120 mg/dl, random of less than 180 and in the event of sugars less than 60 mg/dl or greater than 400 mg/dl encouraged to notify the clinic. Advised on the need for annual eye exams, annual foot exams, Pneumonia vaccine. - POCT glucose (manual entry) - LP+Non-HDL Cholesterol - Basic Metabolic Panel - atorvastatin (LIPITOR) 10 MG tablet; TAKE 1 TABLET(10 MG) BY MOUTH DAILY  Dispense: 90 tablet; Refill: 1 - dapagliflozin propanediol (FARXIGA) 10 MG TABS tablet; Take 1 tablet (10 mg total) by mouth daily before breakfast.  Dispense: 90 tablet; Refill: 1 - glipiZIDE (GLIPIZIDE XL) 10 MG 24 hr tablet; Take 1 tablet (10 mg total) by mouth daily with breakfast.  Dispense: 90 tablet; Refill: 1  2. Osteoarthritis of left knee, unspecified osteoarthritis type Uncontrolled Currently being worked up for left total knee arthroplasty Follow-up with orthopedic - atorvastatin (LIPITOR) 10 MG tablet; TAKE 1 TABLET(10 MG) BY MOUTH DAILY  Dispense: 90 tablet; Refill: 1 -  acetaminophen-codeine (TYLENOL #3) 300-30 MG tablet; Take 1 tablet by mouth every 12 (twelve) hours as needed for moderate pain.  Dispense: 60 tablet; Refill: 1  3. Hypertension associated with diabetes ([place]) Uncontrolled Complains of reaction to amlodipine Amlodipine substituted with losartan Counseled on blood pressure goal of less than 130/80, low-sodium, DASH diet, medication compliance, 150 minutes of moderate intensity exercise per week. Discussed medication compliance, adverse effects. - losartan (COZAAR) 25 MG tablet; Take 1 tablet (25 mg total)  by mouth daily.  Dispense: 90 tablet; Refill: 1 - carvedilol (COREG) 12.5 MG tablet; TAKE 1 TABLET(12.5 MG) BY MOUTH TWICE DAILY WITH A MEAL  Dispense: 180 tablet; Refill: 1  4. Hyperlipidemia due to type 2 diabetes mellitus ([place]) Controlled Continue Lipitor Pharmacist has confirmed that due to her insurance she needs to fill her prescription at [facility] for cheaper price and we have changed pharmacy to [facility]  5. Peripheral arterial disease (HCC) s/p right common femoral to above-knee popliteal bypass in 02/2021 Risk factor modification - gabapentin (NEURONTIN) 100 MG capsule; Take 1 capsule (100 mg total) by mouth 3 (three) times daily.  Dispense: 90 capsule; Refill: 3  6. Preoperative examination Medically optimized for moderate risk procedure EKG revealed normal sinus rhythm, possible left atrial enlargement She is able to achieve 4 METS of activity I have sent clearance letter to Dr Ninfa Linden    Meds ordered this encounter  Medications   losartan (COZAAR) 25 MG tablet    Sig: Take 1 tablet (25 mg total) by mouth daily.    Dispense:  90 tablet    Refill:  1    Discontinue Amlodipine   atorvastatin (LIPITOR) 10 MG tablet    Sig: TAKE 1 TABLET(10 MG) BY MOUTH DAILY    Dispense:  90 tablet    Refill:  1   carvedilol (COREG) 12.5 MG tablet    Sig: TAKE 1 TABLET(12.5 MG) BY MOUTH TWICE DAILY WITH A MEAL    Dispense:  180 tablet     Refill:  1   dapagliflozin propanediol (FARXIGA) 10 MG TABS tablet    Sig: Take 1 tablet (10 mg total) by mouth daily before breakfast.    Dispense:  90 tablet    Refill:  1   gabapentin (NEURONTIN) 300 MG capsule    Sig: TAKE 1 CAPSULE(300 MG) BY MOUTH AT BEDTIME.    Dispense:  90 capsule    Refill:  1   glipiZIDE (GLIPIZIDE XL) 10 MG 24 hr tablet    Sig: Take 1 tablet (10 mg total) by mouth daily with breakfast.    Dispense:  90 tablet    Refill:  1    Discontinue previous    Follow-up: Return in about 6 months (around 02/04/2023) for Chronic medical conditions.       Charlott Rakes, MD, FAAFP. [facility] [place], [place]   08/05/2022, 12:35 PM

## 2022-08-05 NOTE — Patient Instructions (Signed)
Chronic Knee Pain, Adult Chronic knee pain is pain in one or both knees that lasts longer than 3 months. Symptoms of chronic knee pain may include swelling, stiffness, and discomfort. Age-related wear and tear (osteoarthritis) of the knee joint is the most common cause of chronic knee pain. Other possible causes include: A long-term immune-related disease that causes inflammation of the knee (rheumatoid arthritis). This usually affects both knees. Inflammatory arthritis, such as gout or pseudogout. An injury to the knee that causes arthritis. An injury to the knee that damages the ligaments. Ligaments are strong tissues that connect bones to each other. Runner's knee or pain behind the kneecap. Treatment for chronic knee pain depends on the cause. The main treatments for chronic knee pain are physical therapy and weight loss. This condition may also be treated with medicines, injections, a knee sleeve or brace, and by using crutches. Rest, ice, pressure (compression), and elevation, also known as RICE therapy, may also be recommended. Follow these instructions at home: If you have a knee sleeve or brace:  Wear the knee sleeve or brace as told by your health care provider. Remove it only as told by your health care provider. Loosen it if your toes tingle, become numb, or turn cold and blue. Keep it clean. If the sleeve or brace is not waterproof: Do not let it get wet. Remove it if allowed by your health care provider, or cover it with a watertight covering when you take a bath or a shower. Managing pain, stiffness, and swelling     If directed, apply heat to the affected area as often as told by your health care provider. Use the heat source that your health care provider recommends, such as a moist heat pack or a heating pad. If you have a removable knee sleeve or brace, remove it as told by your health care provider. Place a towel between your skin and the heat source. Leave the heat on for  20-30 minutes. Remove the heat if your skin turns bright red. This is especially important if you are unable to feel pain, heat, or cold. You may have a greater risk of getting burned. If directed, put ice on the affected area. To do this: If you have a removable knee sleeve or brace, remove it as told by your health care provider. Put ice in a plastic bag. Place a towel between your skin and the bag. Leave the ice on for 20 minutes, 2-3 times a day. Remove the ice if your skin turns bright red. This is very important. If you cannot feel pain, heat, or cold, you have a greater risk of damage to the area. Move your toes often to reduce stiffness and swelling. Raise (elevate) the injured area above the level of your heart while you are sitting or lying down. Activity Avoid high-impact activities or exercises, such as running, jumping rope, or doing jumping jacks. Follow the exercise plan that your health care provider designed for you. Your health care provider may suggest that you: Avoid activities that make knee pain worse. This may require you to change your exercise routines, sport participation, or job duties. Wear shoes with cushioned soles. Avoid sports that require running and sudden changes in direction. Do physical therapy. Physical therapy is planned to match your needs and abilities. It may include exercises for strength, flexibility, stability, and endurance. Do exercises that increase balance and strength, such as tai chi and yoga. Do not use the injured limb to support your   body weight until your health care provider says that you can. Use crutches as told by your health care provider. Return to your normal activities as told by your health care provider. Ask your health care provider what activities are safe for you. General instructions Take over-the-counter and prescription medicines only as told by your health care provider. Lose weight if you are overweight. Losing even a  little weight can reduce knee pain. Ask your health care provider what your ideal weight is, and how to safely lose extra weight. A dietitian may be able to help you plan your meals. Do not use any products that contain nicotine or tobacco, such as cigarettes, e-cigarettes, and chewing tobacco. These can delay healing. If you need help quitting, ask your health care provider. Keep all follow-up visits. This is important. Contact a health care provider if: You have knee pain that is not getting better or gets worse. You are unable to do your physical therapy exercises due to knee pain. Get help right away if: Your knee swells and the swelling becomes worse. You cannot move your knee. You have severe knee pain. Summary Knee pain that lasts more than 3 months is considered chronic knee pain. The main treatments for chronic knee pain are physical therapy and weight loss. You may also need to take medicines, wear a knee sleeve or brace, use crutches, and apply ice or heat. Losing even a little weight can reduce knee pain. Ask your health care provider what your ideal weight is, and how to safely lose extra weight. A dietitian may be able to help you plan your meals. Follow the exercise plan that your health care provider designed for you. This information is not intended to replace advice given to you by your health care provider. Make sure you discuss any questions you have with your health care provider. Document Revised: 03/21/2020 Document Reviewed: 03/21/2020 Elsevier Patient Education  [place].

## 2022-08-06 LAB — BASIC METABOLIC PANEL
BUN/Creatinine Ratio: 13 (ref 12–28)
BUN: 13 mg/dL (ref 8–27)
CO2: 27 mmol/L (ref 20–29)
Calcium: 10.2 mg/dL (ref 8.7–10.3)
Chloride: 103 mmol/L (ref 96–106)
Creatinine, Ser: 1.03 mg/dL — ABNORMAL HIGH (ref 0.57–1.00)
Glucose: 64 mg/dL — ABNORMAL LOW (ref 70–99)
Potassium: 4.1 mmol/L (ref 3.5–5.2)
Sodium: 144 mmol/L (ref 134–144)
eGFR: 58 mL/min/{1.73_m2} — ABNORMAL LOW (ref >59)

## 2022-08-06 LAB — LP+NON-HDL CHOLESTEROL
Cholesterol, Total: 105 mg/dL (ref 100–199)
HDL: 51 mg/dL
LDL Chol Calc (NIH): 40 mg/dL (ref 0–99)
Total Non-HDL-Chol (LDL+VLDL): 54 mg/dL (ref 0–129)
Triglycerides: 65 mg/dL (ref 0–149)
VLDL Cholesterol Cal: 14 mg/dL (ref 5–40)

## 2022-08-07 ENCOUNTER — Telehealth: Payer: Self-pay

## 2022-08-07 DIAGNOSIS — M1712 Unilateral primary osteoarthritis, left knee: Secondary | ICD-10-CM

## 2022-08-07 NOTE — Telephone Encounter (Signed)
Pt is requesting to get Tylenol #3 changed to [facility]

## 2022-08-08 MED ORDER — ACETAMINOPHEN-CODEINE 300-30 MG PO TABS: 1.0000 | ORAL_TABLET | Freq: Two times a day (BID) | ORAL | 1 refills | Status: DC | PRN

## 2022-08-08 NOTE — Telephone Encounter (Signed)
Done

## 2022-08-11 NOTE — Telephone Encounter (Signed)
Pt called and informed of medication being sent to pharmacy. 

## 2022-08-26 DIAGNOSIS — D2371 Other benign neoplasm of skin of right lower limb, including hip: Secondary | ICD-10-CM | POA: Diagnosis not present

## 2022-09-16 ENCOUNTER — Other Ambulatory Visit: Payer: Self-pay | Admitting: Family Medicine

## 2022-09-16 ENCOUNTER — Ambulatory Visit: Payer: BC Managed Care – PPO | Admitting: Orthopaedic Surgery

## 2022-09-16 DIAGNOSIS — I10 Essential (primary) hypertension: Secondary | ICD-10-CM

## 2022-09-18 ENCOUNTER — Ambulatory Visit (INDEPENDENT_AMBULATORY_CARE_PROVIDER_SITE_OTHER): Payer: BC Managed Care – PPO | Admitting: Physician Assistant

## 2022-09-18 ENCOUNTER — Encounter: Payer: Self-pay | Admitting: Physician Assistant

## 2022-09-18 DIAGNOSIS — M1712 Unilateral primary osteoarthritis, left knee: Secondary | ICD-10-CM | POA: Diagnosis not present

## 2022-09-18 MED ORDER — METHYLPREDNISOLONE ACETATE 40 MG/ML IJ SUSP
40.0000 mg | INTRAMUSCULAR | Status: AC | PRN
  Administered 2022-09-18: 40 mg via INTRA_ARTICULAR

## 2022-09-18 MED ORDER — LIDOCAINE HCL 1 % IJ SOLN
3.0000 mL | INTRAMUSCULAR | Status: AC | PRN
  Administered 2022-09-18: 3 mL

## 2022-09-18 NOTE — Progress Notes (Signed)
   Procedure Note  Patient: Tammy Graham             Date of Birth: 1951/07/02           MRN: [id number]             Visit Date: 09/18/2022 HPI: Patient comes in today due to left knee pain.  She has known severe end-stage arthritis of the left knee.  She has been contemplating left total knee arthroplasty.  [place] she comes in today wanting to cortisone injection in the knee.  She has had no new injury.  She is diabetic but reports good control.  She states that she is thinking she may undergo a left knee arthroplasty sometime after the first the year.  Review of systems: Negative for fevers chills.  Physical exam Left knee: Positive effusion no abnormal warmth or erythema.  Limited range of motion.  Significant patellofemoral crepitus.  Walks with an antalgic gait without any assistive device. Procedures: Visit Diagnoses:  1. Unilateral primary osteoarthritis, left knee     Large Joint Inj on 09/18/2022 4:47 PM Indications: pain Details: 22 G 1.5 in needle, anterolateral approach  Arthrogram: No  Medications: 3 mL lidocaine 1 %; 40 mg methylPREDNISolone acetate 40 MG/ML Aspirate: 22 mL yellow and blood-tinged Outcome: tolerated well, no immediate complications Procedure, treatment alternatives, risks and benefits explained, specific risks discussed. Consent was given by the patient. Immediately prior to procedure a time out was called to verify the correct patient, procedure, equipment, support staff and site/side marked as required. Patient was prepped and draped in the usual sterile fashion.     Plan: She will follow-up with [name] in January to discuss left total knee arthroplasty.  Questions were encouraged and answered at length.

## 2022-10-04 ENCOUNTER — Ambulatory Visit
Admission: EM | Admit: 2022-10-04 | Discharge: 2022-10-04 | Disposition: A | Discharge status: Home / Self Care | Payer: BC Managed Care – PPO | Attending: Emergency Medicine | Admitting: Emergency Medicine

## 2022-10-04 ENCOUNTER — Encounter: Payer: Self-pay | Admitting: *Deleted

## 2022-10-04 DIAGNOSIS — N898 Other specified noninflammatory disorders of vagina: Secondary | ICD-10-CM | POA: Insufficient documentation

## 2022-10-04 DIAGNOSIS — Z113 Encounter for screening for infections with a predominantly sexual mode of transmission: Secondary | ICD-10-CM | POA: Insufficient documentation

## 2022-10-04 LAB — POCT URINALYSIS DIP (MANUAL ENTRY)
Bilirubin, UA: NEGATIVE
Glucose, UA: >=1000 mg/dL — AB
Ketones, POC UA: NEGATIVE mg/dL
Leukocytes, UA: NEGATIVE
Nitrite, UA: NEGATIVE
Protein Ur, POC: NEGATIVE mg/dL
Spec Grav, UA: 1.015 (ref 1.010–1.025)
Urobilinogen, UA: 0.2 E.U./dL
pH, UA: 6.5 (ref 5.0–8.0)

## 2022-10-04 MED ORDER — FLUCONAZOLE 150 MG PO TABS
150.0000 mg | ORAL_TABLET | Freq: Once | ORAL | 0 refills | Status: AC
Start: 2022-10-04 — End: 2022-10-04

## 2022-10-04 NOTE — ED Provider Notes (Signed)
[place]   [id number] 10/04/22 Arrival Time: 68  Chief Complaint  Patient presents with   Vaginal Itching     SUBJECTIVE:  Tammy Graham is a 71 y.o. female scented to the urgent care with a complaint of vaginal itching for the past 5 days.  She is diabetic and reports her medication was changed by her doctor recently and developed the symptoms there after.  She was reports she has a 1 female partner and has a concern.  Denies any vaginal discharge.  Has not tried any medication.  Denies any aggravating factors.  Denies symptoms the past.  She denies fever, chills, nausea, vomiting, abdominal or pelvic pain, urinary symptoms, vaginal odor, vaginal bleeding, dyspareunia, vaginal rashes or lesions.   No LMP recorded. Patient is postmenopausal. Current birth control method: Compliant with BC:  ROS: As per HPI.  All other pertinent ROS negative.     Past Medical History:  Diagnosis Date   Anxiety    Arthritis    shoulder and knees   CAD (coronary artery disease)    Complication of anesthesia    states she was still awake one time when surgery started   DM (diabetes mellitus) ([place])    HTN (hypertension)    Hyperlipemia    Myocardial infarction [facility])    Peripheral vascular disease ([place])    Pneumonia    Sleep apnea    no cpap   Stroke [facility])    2012   Past Surgical History:  Procedure Laterality Date   ABDOMINAL AORTOGRAM W/LOWER EXTREMITY Bilateral 02/11/2021   Procedure: ABDOMINAL AORTOGRAM W/LOWER EXTREMITY;  Surgeon: Waynetta Sandy, MD;  Location: [place] [facility];  Service: Cardiovascular;  Laterality: Bilateral;   CARDIAC CATHETERIZATION     COLONOSCOPY     COLONOSCOPY  09/25/2011   Procedure: COLONOSCOPY;  Surgeon: Winfield Cunas., MD;  [facility];  Service: Endoscopy;  Laterality: N/A;   CORONARY STENT PLACEMENT     Ostial LAD stent in 2012   FEMORAL-POPLITEAL BYPASS GRAFT Right 02/27/2021   Procedure: RIGHT COMMON FEMORAL-  ABOVE KNEE POPLITEAL ARTERY BYPASS GRAFT;  Surgeon: Waynetta Sandy, MD;  Location: [place];  Service: Vascular;  Laterality: Right;   Left knee surgery     Allergies  Allergen Reactions   Benadryl [Diphenhydramine Hcl (Sleep)] Other (See Comments)    unknown   Rosuvastatin Hives    Patient is allergic to generic rosuvastatin. She says she is able to take branded Crestor.    Simvastatin Other (See Comments)    unknown   No current facility-administered medications on file prior to encounter.   Current Outpatient Medications on File Prior to Encounter  Medication Sig Dispense Refill   aspirin EC 81 MG tablet Take 81 mg by mouth in the morning. Swallow whole.     atorvastatin (LIPITOR) 10 MG tablet TAKE 1 TABLET(10 MG) BY MOUTH DAILY 90 tablet 1   carvedilol (COREG) 12.5 MG tablet TAKE 1 TABLET(12.5 MG) BY MOUTH TWICE DAILY WITH A MEAL 180 tablet 1   dapagliflozin propanediol (FARXIGA) 10 MG TABS tablet Take 1 tablet (10 mg total) by mouth daily before breakfast. 90 tablet 1   gabapentin (NEURONTIN) 100 MG capsule Take 1 capsule (100 mg total) by mouth 3 (three) times daily. 90 capsule 3   Multiple Vitamin (MULTIVITAMIN WITH MINERALS) TABS tablet Take 1 tablet by mouth in the morning.     ACCU-CHEK AVIVA PLUS test strip USE AS INSTRUCTED 100 strip 0  Accu-Chek FastClix Lancets MISC USE AS INSTRUCTED 102 each 11   Accu-Chek FastClix Lancets MISC Use daily as directed 102 each 3   ACCU-CHEK GUIDE test strip USE AS INSTRUCTED 100 strip 1   acetaminophen-codeine (TYLENOL #3) 300-30 MG tablet Take 1 tablet by mouth every 12 (twelve) hours as needed for moderate pain. 60 tablet 1   Blood Glucose Monitoring Suppl (ACCU-CHEK GUIDE ME) w/Device KIT 1 kit by Does not apply route daily. Use to check blood sugar daily. 1 kit 0   carboxymethylcellul-glycerin (LUBRICANT DROPS/DUAL-ACTION) 0.5-0.9 % ophthalmic solution Place 1-2 drops into both eyes 3 (three) times daily as needed for dry eyes.      cetirizine (ZYRTEC ALLERGY) 10 MG tablet Take 1 tablet (10 mg total) by mouth daily. 14 tablet 0   glipiZIDE (GLIPIZIDE XL) 10 MG 24 hr tablet Take 1 tablet (10 mg total) by mouth daily with breakfast. 90 tablet 1   glucose blood test strip Use as instructed 100 each 12   hydrocortisone cream 1 % Apply to affected area 2 times daily 15 g 0   losartan (COZAAR) 25 MG tablet Take 1 tablet (25 mg total) by mouth daily. 90 tablet 1   metroNIDAZOLE (FLAGYL) 500 MG tablet Take 1 tablet (500 mg total) by mouth 2 (two) times daily. 14 tablet 0   Misc. Devices MISC Shower bench, Above the toilet seat.  Diagnosis- Type 2 Diabetes Mellitus 1 each 0   nicotine (NICODERM CQ - DOSED IN MG/24 HOURS) 14 mg/24hr patch Place 1 patch (14 mg total) onto the skin daily. 28 patch 0   nitroGLYCERIN (NITROSTAT) 0.4 MG SL tablet Place 0.4 mg under the tongue every 5 (five) minutes as needed for chest pain.     PRESCRIPTION MEDICATION Apply 1 application topically in the morning and at bedtime. Kamea Foot Cream      Social History   Socioeconomic History   Marital status: Widowed    Spouse name: Not on file   Number of children: Not on file   Years of education: Not on file   Highest education level: Not on file  Occupational History   Not on file  Tobacco Use   Smoking status: Every Day    Packs/day: 0.25    Years: 15.00    Total pack years: 3.75    Types: Cigarettes    Last attempt to quit: 02/27/2021    Years since quitting: 1.6   Smokeless tobacco: Never   Tobacco comments:    4-5 daily  Vaping Use   Vaping Use: Never used  Substance and Sexual Activity   Alcohol use: Not Currently    Alcohol/week: 1.0 standard drink of alcohol    Types: 1 Standard drinks or equivalent per week    Comment: occasionally    Drug use: No   Sexual activity: Yes  Other Topics Concern   Not on file  Social History Narrative   Not on file   Social Determinants of Health   Financial Resource Strain: Not on file   Food Insecurity: Not on file  Transportation Needs: Not on file  Physical Activity: Not on file  Stress: Not on file  Social Connections: Not on file  Intimate Partner Violence: Not on file   Family History  Problem Relation Age of Onset   Heart attack Mother    Anesthesia problems Neg Hx    Hypotension Neg Hx    Malignant hyperthermia Neg Hx    Pseudochol deficiency Neg Hx       OBJECTIVE:  Vitals:   10/04/22 1356  BP: (!) 182/98  Pulse: 66  Resp: 18  Temp: 98 F (36.7 C)  TempSrc: Oral  SpO2: 97%     General appearance: Alert, NAD, appears stated age Head: NCAT Throat: lips, mucosa, and tongue normal; teeth and gums normal Lungs: CTA bilaterally without adventitious breath sounds Heart: regular rate and rhythm.  Radial pulses 2+ symmetrical bilaterally Back: no CVA tenderness Abdomen: soft, non-tender; bowel sounds normal; no masses or organomegaly; no guarding or rebound tenderness GU: Cervical swab obtained Skin: warm and dry Psychological:  Alert and cooperative. Normal mood and affect.  LABS:  Results for orders placed or performed during the hospital encounter of 10/04/22  POCT urinalysis dipstick  Result Value Ref Range   Color, UA yellow yellow   Clarity, UA clear clear   Glucose, UA >=1,000 (A) negative mg/dL   Bilirubin, UA negative negative   Ketones, POC UA negative negative mg/dL   Spec Grav, UA 1.015 1.010 - 1.025   Blood, UA small (A) negative   pH, UA 6.5 5.0 - 8.0   Protein Ur, POC negative negative mg/dL   Urobilinogen, UA 0.2 0.2 or 1.0 E.U./dL   Nitrite, UA Negative Negative   Leukocytes, UA Negative Negative    Labs Reviewed  POCT URINALYSIS DIP (MANUAL ENTRY) - Abnormal; Notable for the following components:      Result Value   Glucose, UA >=1,000 (*)    Blood, UA small (*)    All other components within normal limits  URINE CULTURE  CERVICOVAGINAL ANCILLARY ONLY    ASSESSMENT & PLAN:  1. Vaginal itching   2. Routine  screening for STI (sexually transmitted infection)     Meds ordered this encounter  Medications   fluconazole (DIFLUCAN) 150 MG tablet    Sig: Take 1 tablet (150 mg total) by mouth once for 1 dose. May take the second dose 72 hours after the first if symptoms does not resolve    Dispense:  2 tablet    Refill:  0    Pending: Labs Reviewed  POCT URINALYSIS DIP (MANUAL ENTRY) - Abnormal; Notable for the following components:      Result Value   Glucose, UA >=1,000 (*)    Blood, UA small (*)    All other components within normal limits  URINE CULTURE  CERVICOVAGINAL ANCILLARY ONLY   Discharge instructions  Vaginal self-swab obtained.  We will follow up with you regarding abnormal results Prescribed diflucan 150 mg once daily and then second dose 72 hours later Take medications as prescribed and to completion Return here or go to ER if you have any new or worsening symptoms fever, chills, nausea, vomiting, abdominal or pelvic pain, painful intercourse, vaginal discharge, vaginal bleeding, persistent symptoms despite treatment, etc...  Reviewed expectations re: course of current medical issues. Questions answered. Outlined signs and symptoms indicating need for more acute intervention. Patient verbalized understanding. After Visit Summary given.        Avegno, Komlanvi S, FNP 10/04/22 1442  

## 2022-10-04 NOTE — ED Triage Notes (Signed)
States she recently had "all my diabetic medicines changed" and she's been having a lot of dizziness "if I take the new diabetes med, and I feel better if I don't take it". Also c/o vaginal irritation onset 5 days ago; pt concerned her sexual partner may be cheating on her, so she's concerned for possible STIs.

## 2022-10-04 NOTE — Discharge Instructions (Signed)
  Vaginal self-swab obtained.  We will follow up with you regarding abnormal results Prescribed diflucan 150 mg once daily and then second dose 72 hours later Take medications as prescribed and to completion Return here or go to ER if you have any new or worsening symptoms fever, chills, nausea, vomiting, abdominal or pelvic pain, painful intercourse, vaginal discharge, vaginal bleeding, persistent symptoms despite treatment, etc..Marland Kitchen

## 2022-10-05 LAB — URINE CULTURE

## 2022-10-06 LAB — CERVICOVAGINAL ANCILLARY ONLY
Bacterial Vaginitis (gardnerella): NEGATIVE
Candida Glabrata: NEGATIVE
Candida Vaginitis: POSITIVE — AB
Chlamydia: NEGATIVE
Comment: NEGATIVE
Comment: NEGATIVE
Comment: NEGATIVE
Comment: NEGATIVE
Comment: NEGATIVE
Comment: NORMAL
Neisseria Gonorrhea: NEGATIVE
Trichomonas: NEGATIVE

## 2022-10-07 DIAGNOSIS — H40033 Anatomical narrow angle, bilateral: Secondary | ICD-10-CM | POA: Diagnosis not present

## 2022-10-07 DIAGNOSIS — H2513 Age-related nuclear cataract, bilateral: Secondary | ICD-10-CM | POA: Diagnosis not present

## 2022-10-07 DIAGNOSIS — E119 Type 2 diabetes mellitus without complications: Secondary | ICD-10-CM | POA: Diagnosis not present

## 2022-10-17 DIAGNOSIS — M25552 Pain in left hip: Secondary | ICD-10-CM | POA: Diagnosis not present

## 2022-10-17 DIAGNOSIS — M25562 Pain in left knee: Secondary | ICD-10-CM | POA: Diagnosis not present

## 2022-10-17 DIAGNOSIS — M25512 Pain in left shoulder: Secondary | ICD-10-CM | POA: Diagnosis not present

## 2022-10-17 DIAGNOSIS — M549 Dorsalgia, unspecified: Secondary | ICD-10-CM | POA: Diagnosis not present

## 2022-10-17 DIAGNOSIS — R519 Headache, unspecified: Secondary | ICD-10-CM | POA: Diagnosis not present

## 2022-10-17 DIAGNOSIS — M25462 Effusion, left knee: Secondary | ICD-10-CM | POA: Diagnosis not present

## 2022-10-17 DIAGNOSIS — M546 Pain in thoracic spine: Secondary | ICD-10-CM | POA: Diagnosis not present

## 2022-10-22 DIAGNOSIS — H538 Other visual disturbances: Secondary | ICD-10-CM | POA: Diagnosis not present

## 2022-10-23 ENCOUNTER — Ambulatory Visit (INDEPENDENT_AMBULATORY_CARE_PROVIDER_SITE_OTHER): Payer: BC Managed Care – PPO | Admitting: Physician Assistant

## 2022-10-23 VITALS — Wt 152.2 lb

## 2022-10-23 DIAGNOSIS — M1712 Unilateral primary osteoarthritis, left knee: Secondary | ICD-10-CM

## 2022-10-23 DIAGNOSIS — S0993XA Unspecified injury of face, initial encounter: Secondary | ICD-10-CM | POA: Diagnosis not present
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
## 2022-10-29 ENCOUNTER — Ambulatory Visit
Admission: EM | Admit: 2022-10-29 | Discharge: 2022-10-29 | Disposition: A | Discharge status: Home / Self Care | Payer: BC Managed Care – PPO | Attending: Urgent Care | Admitting: Urgent Care

## 2022-10-29 DIAGNOSIS — E119 Type 2 diabetes mellitus without complications: Secondary | ICD-10-CM | POA: Diagnosis not present

## 2022-10-29 DIAGNOSIS — R350 Frequency of micturition: Secondary | ICD-10-CM | POA: Insufficient documentation

## 2022-10-29 DIAGNOSIS — B3731 Acute candidiasis of vulva and vagina: Secondary | ICD-10-CM | POA: Diagnosis not present

## 2022-10-29 LAB — POCT URINALYSIS DIP (MANUAL ENTRY)
Bilirubin, UA: NEGATIVE
Glucose, UA: 500 mg/dL — AB
Ketones, POC UA: NEGATIVE mg/dL
Leukocytes, UA: NEGATIVE
Nitrite, UA: NEGATIVE
Spec Grav, UA: >=1.03 — AB (ref 1.010–1.025)
Urobilinogen, UA: 0.2 E.U./dL
pH, UA: 6.5 (ref 5.0–8.0)

## 2022-10-29 MED ORDER — FLUCONAZOLE 150 MG PO TABS: 150.0000 mg | ORAL_TABLET | ORAL | 0 refills | Status: DC

## 2022-10-29 NOTE — ED Triage Notes (Signed)
Pt c/o cont'd vaginal itching-recent dx with yeast infection-states she took the "2 pills"-NAD-steady gait

## 2022-10-29 NOTE — ED Provider Notes (Signed)
[facility] - URGENT CARE CENTER  Note:  This document was prepared using Systems analyst and may include unintentional dictation errors.  MRN: [id number] DOB: 1950-12-26  Subjective:   Tammy Graham is a 72 y.o. female presenting for recurrent vaginal itching, vaginal irritation.  Patient was seen in mid December, tested positive for yeast vaginitis.  She was prescribed oral fluconazole.  She took 2 doses and states that normally helps but this time is persisting.  Has also had urinary frequency.  No vaginal discharge.  Denies fever, nausea, vomiting, abdominal or pelvic pain.  She has type 2 diabetes treated without insulin.  No current facility-administered medications for this encounter.  Current Outpatient Medications:    ACCU-CHEK AVIVA PLUS test strip, USE AS INSTRUCTED, Disp: 100 strip, Rfl: 0   Accu-Chek FastClix Lancets MISC, USE AS INSTRUCTED, Disp: 102 each, Rfl: 11   Accu-Chek FastClix Lancets MISC, Use daily as directed, Disp: 102 each, Rfl: 3   ACCU-CHEK GUIDE test strip, USE AS INSTRUCTED, Disp: 100 strip, Rfl: 1   acetaminophen-codeine (TYLENOL #3) 300-30 MG tablet, Take 1 tablet by mouth every 12 (twelve) hours as needed for moderate pain., Disp: 60 tablet, Rfl: 1   aspirin EC 81 MG tablet, Take 81 mg by mouth in the morning. Swallow whole., Disp: , Rfl:    atorvastatin (LIPITOR) 10 MG tablet, TAKE 1 TABLET(10 MG) BY MOUTH DAILY, Disp: 90 tablet, Rfl: 1   Blood Glucose Monitoring Suppl (ACCU-CHEK GUIDE ME) w/Device KIT, 1 kit by Does not apply route daily. Use to check blood sugar daily., Disp: 1 kit, Rfl: 0   carboxymethylcellul-glycerin (LUBRICANT DROPS/DUAL-ACTION) 0.5-0.9 % ophthalmic solution, Place 1-2 drops into both eyes 3 (three) times daily as needed for dry eyes., Disp: , Rfl:    carvedilol (COREG) 12.5 MG tablet, TAKE 1 TABLET(12.5 MG) BY MOUTH TWICE DAILY WITH A MEAL, Disp: 180 tablet, Rfl: 1   cetirizine (ZYRTEC ALLERGY) 10 MG tablet,  Take 1 tablet (10 mg total) by mouth daily., Disp: 14 tablet, Rfl: 0   dapagliflozin propanediol (FARXIGA) 10 MG TABS tablet, Take 1 tablet (10 mg total) by mouth daily before breakfast., Disp: 90 tablet, Rfl: 1   gabapentin (NEURONTIN) 100 MG capsule, Take 1 capsule (100 mg total) by mouth 3 (three) times daily., Disp: 90 capsule, Rfl: 3   glipiZIDE (GLIPIZIDE XL) 10 MG 24 hr tablet, Take 1 tablet (10 mg total) by mouth daily with breakfast., Disp: 90 tablet, Rfl: 1   glucose blood test strip, Use as instructed, Disp: 100 each, Rfl: 12   hydrocortisone cream 1 %, Apply to affected area 2 times daily, Disp: 15 g, Rfl: 0   losartan (COZAAR) 25 MG tablet, Take 1 tablet (25 mg total) by mouth daily., Disp: 90 tablet, Rfl: 1   metroNIDAZOLE (FLAGYL) 500 MG tablet, Take 1 tablet (500 mg total) by mouth 2 (two) times daily., Disp: 14 tablet, Rfl: 0   Misc. Devices MISC, Shower bench, Above the toilet seat.  Diagnosis- Type 2 Diabetes Mellitus, Disp: 1 each, Rfl: 0   Multiple Vitamin (MULTIVITAMIN WITH MINERALS) TABS tablet, Take 1 tablet by mouth in the morning., Disp: , Rfl:    nicotine (NICODERM CQ - DOSED IN MG/24 HOURS) 14 mg/24hr patch, Place 1 patch (14 mg total) onto the skin daily., Disp: 28 patch, Rfl: 0   nitroGLYCERIN (NITROSTAT) 0.4 MG SL tablet, Place 0.4 mg under the tongue every 5 (five) minutes as needed for chest pain., Disp: , Rfl:  PRESCRIPTION MEDICATION, Apply 1 application topically in the morning and at bedtime. Kamea Foot Cream, Disp: , Rfl:    Allergies  Allergen Reactions   Benadryl [Diphenhydramine Hcl (Sleep)] Other (See Comments)    unknown   Rosuvastatin Hives    Patient is allergic to generic rosuvastatin. She says she is able to take branded Crestor.    Simvastatin Other (See Comments)    unknown    Past Medical History:  Diagnosis Date   Anxiety    Arthritis    shoulder and knees   CAD (coronary artery disease)    Complication of anesthesia    states she  was still awake one time when surgery started   DM (diabetes mellitus) ([place])    HTN (hypertension)    Hyperlipemia    Myocardial infarction [facility])    Peripheral vascular disease ([place])    Pneumonia    Sleep apnea    no cpap   Stroke [facility])    2012     Past Surgical History:  Procedure Laterality Date   ABDOMINAL AORTOGRAM W/LOWER EXTREMITY Bilateral 02/11/2021   Procedure: ABDOMINAL AORTOGRAM W/LOWER EXTREMITY;  Surgeon: Waynetta Sandy, MD;  Location: [place] [facility];  Service: Cardiovascular;  Laterality: Bilateral;   CARDIAC CATHETERIZATION     COLONOSCOPY     COLONOSCOPY  09/25/2011   Procedure: COLONOSCOPY;  Surgeon: Winfield Cunas., MD;  [facility];  Service: Endoscopy;  Laterality: N/A;   CORONARY STENT PLACEMENT     Ostial LAD stent in 2012   FEMORAL-POPLITEAL BYPASS GRAFT Right 02/27/2021   Procedure: RIGHT COMMON FEMORAL- ABOVE KNEE POPLITEAL ARTERY BYPASS GRAFT;  Surgeon: Waynetta Sandy, MD;  Location: [place];  Service: Vascular;  Laterality: Right;   Left knee surgery      Family History  Problem Relation Age of Onset   Heart attack Mother    Anesthesia problems Neg Hx    Hypotension Neg Hx    Malignant hyperthermia Neg Hx    Pseudochol deficiency Neg Hx     Social History   Tobacco Use   Smoking status: Every Day    Packs/day: 0.25    Years: 15.00    Total pack years: 3.75    Types: Cigarettes    Last attempt to quit: 02/27/2021    Years since quitting: 1.6   Smokeless tobacco: Never   Tobacco comments:    4-5 daily  Vaping Use   Vaping Use: Never used  Substance Use Topics   Alcohol use: Not Currently    Alcohol/week: 1.0 standard drink of alcohol    Types: 1 Standard drinks or equivalent per week    Comment: occasionally    Drug use: No    ROS   Objective:   Vitals: BP (!) 159/89 (BP Location: Right Arm)   Pulse 75   Temp 98.3 F (36.8 C) (Oral)   Resp 16   SpO2 95%   Physical Exam Constitutional:       General: She is not in acute distress.    Appearance: Normal appearance. She is well-developed. She is not ill-appearing, toxic-appearing or diaphoretic.  HENT:     Head: Normocephalic and atraumatic.     Nose: Nose normal.     Mouth/Throat:     Mouth: Mucous membranes are moist.     Pharynx: Oropharynx is clear.  Eyes:     General: No scleral icterus.       Right eye: No discharge.  Left eye: No discharge.     Extraocular Movements: Extraocular movements intact.     Conjunctiva/sclera: Conjunctivae normal.  Cardiovascular:     Rate and Rhythm: Normal rate.  Pulmonary:     Effort: Pulmonary effort is normal.  Abdominal:     General: Bowel sounds are normal. There is no distension.     Palpations: Abdomen is soft. There is no mass.     Tenderness: There is no abdominal tenderness. There is no right CVA tenderness, left CVA tenderness, guarding or rebound.  Skin:    General: Skin is warm and dry.  Neurological:     General: No focal deficit present.     Mental Status: She is alert and oriented to person, place, and time.  Psychiatric:        Mood and Affect: Mood normal.        Behavior: Behavior normal.        Thought Content: Thought content normal.        Judgment: Judgment normal.     Results for orders placed or performed during the hospital encounter of 10/29/22 (from the past 24 hour(s))  POCT urinalysis dipstick     Status: Abnormal   Collection Time: 10/29/22  3:08 PM  Result Value Ref Range   Color, UA yellow yellow   Clarity, UA clear clear   Glucose, UA =500 (A) negative mg/dL   Bilirubin, UA negative negative   Ketones, POC UA negative negative mg/dL   Spec Grav, UA >=1.030 (A) 1.010 - 1.025   Blood, UA small (A) negative   pH, UA 6.5 5.0 - 8.0   Protein Ur, POC trace (A) negative mg/dL   Urobilinogen, UA 0.2 0.2 or 1.0 E.U./dL   Nitrite, UA Negative Negative   Leukocytes, UA Negative Negative     Assessment and Plan :   PDMP not reviewed  this encounter.  1. Yeast vaginitis   2. Type 2 diabetes mellitus treated without insulin ([place])   3. Urinary frequency     Recommended additional oral fluconazole at a dose of 150 mg every 3 days likely worsened by her diabetes.  Creatinine clearance 55 mL/min.  Urine culture and vaginal swab results pending. Counseled patient on potential for adverse effects with medications prescribed/recommended today, ER and return-to-clinic precautions discussed, patient verbalized understanding.    Jaynee Eagles, [place] 10/29/22 1657

## 2022-10-29 NOTE — Discharge Instructions (Signed)
Please start oral fluconazole once every 3 days to address a persistent yeast infection. Make sure you hydrate very well with plain water and a quantity of 64 ounces of water a day.  Please limit drinks that are considered urinary irritants such as soda, sweet tea, coffee, energy drinks, alcohol.  These can worsen your urinary and genital symptoms but also be the source of them.  I will let you know about your urine culture and vaginal swab results through MyChart to see if we need to prescribe or change your antibiotics based off of those results.

## 2022-10-30 LAB — CERVICOVAGINAL ANCILLARY ONLY
Bacterial Vaginitis (gardnerella): NEGATIVE
Candida Glabrata: NEGATIVE
Candida Vaginitis: POSITIVE — AB
Chlamydia: NEGATIVE
Comment: NEGATIVE
Comment: NEGATIVE
Comment: NEGATIVE
Comment: NEGATIVE
Comment: NEGATIVE
Comment: NORMAL
Neisseria Gonorrhea: NEGATIVE
Trichomonas: NEGATIVE

## 2022-10-30 LAB — URINE CULTURE: Culture: NO GROWTH

## 2022-11-06 ENCOUNTER — Encounter: Payer: Self-pay | Admitting: Optometrist

## 2022-11-06 ENCOUNTER — Other Ambulatory Visit: Payer: Self-pay | Admitting: Family Medicine

## 2022-11-06 DIAGNOSIS — I152 Hypertension secondary to endocrine disorders: Secondary | ICD-10-CM

## 2022-11-12 ENCOUNTER — Other Ambulatory Visit: Payer: Self-pay | Admitting: *Deleted

## 2022-11-12 DIAGNOSIS — I739 Peripheral vascular disease, unspecified: Secondary | ICD-10-CM

## 2022-11-12 DIAGNOSIS — I714 Abdominal aortic aneurysm, without rupture, unspecified: Secondary | ICD-10-CM

## 2022-11-13 ENCOUNTER — Other Ambulatory Visit: Payer: Self-pay | Admitting: Family Medicine

## 2022-11-13 DIAGNOSIS — M1712 Unilateral primary osteoarthritis, left knee: Secondary | ICD-10-CM

## 2022-11-14 NOTE — Telephone Encounter (Signed)
Requested medications are due for refill today.  Provider to determine  Requested medications are on the active medications list.  yes  Last refill. 08/08/2022 #60 1 rf  Future visit scheduled.   yes  Notes to clinic.  Refill not delegated.    Requested Prescriptions  Pending Prescriptions Disp Refills   acetaminophen-codeine (TYLENOL #3) 300-30 MG tablet [Pharmacy Med Name: ACETAMINOPHEN/COD #3 (300/'30MG'$ ) TAB] 60 tablet     Sig: TAKE 1 TABLET BY MOUTH EVERY 12 HOURS AS NEEDED FOR MODERATE PAIN     Not Delegated - Analgesics:  Opioid Agonist Combinations 2 Failed - 11/13/2022  5:01 PM      Failed - This refill cannot be delegated      Failed - Cr in normal range and within 360 days    Creat  Date Value Ref Range Status  11/01/2015 1.20 (H) 0.50 - 0.99 mg/dL Final   Creatinine, Ser  Date Value Ref Range Status  08/05/2022 1.03 (H) 0.57 - 1.00 mg/dL Final   Creatinine, Urine  Date Value Ref Range Status  11/01/2015 82 20 - 320 mg/dL Final         Failed - Urine Drug Screen completed in last 360 days      Failed - Valid encounter within last 3 months    Recent Outpatient Visits           3 months ago Type 2 diabetes mellitus with diabetic polyneuropathy, without long-term current use of insulin ([place])   [place], [place], MD   5 months ago Type 2 diabetes mellitus with diabetic polyneuropathy, without long-term current use of insulin ([place])   [place], Charlane Ferretti, MD   1 year ago Type 2 diabetes mellitus with diabetic polyneuropathy, without long-term current use of insulin ([place])   [place], [place], MD   1 year ago Type 2 diabetes mellitus with diabetic polyneuropathy, without long-term current use of insulin ([place])   [place], [place], MD   1 year ago Type 2 diabetes mellitus with diabetic  polyneuropathy, without long-term current use of insulin ([place])   [place] Charlott Rakes, MD       Future Appointments             In 6 days Mcarthur Rossetti, MD [facility]   In 3 weeks Charlott Rakes, MD [place] - eGFR is 10 or above and within 360 days    GFR, Est African American  Date Value Ref Range Status  01/11/2014 66 mL/min Final   GFR calc Af Amer  Date Value Ref Range Status  02/21/2020 55 (L) >59 mL/min/1.73 Final    Comment:    **[facility] currently reports eGFR in compliance with the current**   recommendations of the [employer]. [facility] will   update reporting as new guidelines are published from the NKF-ASN   Task force.    GFR, Est Non African American  Date Value Ref Range Status  01/11/2014 57 (L) mL/min Final    Comment:      The estimated GFR is a calculation valid for adults (>=32 years old) that uses the CKD-EPI algorithm to adjust for age and sex. It is   not to be used for  children, pregnant women, hospitalized patients,    patients on dialysis, or with rapidly changing kidney function. According to the NKDEP, eGFR >89 is normal, 60-89 shows mild impairment, 30-59 shows moderate impairment, 15-29 shows severe impairment and <15 is ESRD.     GFR, Estimated  Date Value Ref Range Status  02/28/2021 58 (L) >60 mL/min Final    Comment:    (NOTE) Calculated using the CKD-EPI Creatinine Equation (2021)    eGFR  Date Value Ref Range Status  08/05/2022 58 (L) >59 mL/min/1.73 Final         Passed - Patient is not pregnant

## 2022-11-20 ENCOUNTER — Telehealth: Payer: Self-pay

## 2022-11-20 ENCOUNTER — Ambulatory Visit: Payer: BC Managed Care – PPO | Admitting: Orthopaedic Surgery

## 2022-11-20 NOTE — Telephone Encounter (Signed)
I wrote a prescription for 60 tablets with 1 refill of Tylenol 3 on 11/14/2021.  Can you please check with the pharmacy to see what the problem is?  Thank you
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
## 2022-11-21 ENCOUNTER — Other Ambulatory Visit: Payer: Self-pay

## 2022-11-26 ENCOUNTER — Ambulatory Visit (INDEPENDENT_AMBULATORY_CARE_PROVIDER_SITE_OTHER): Payer: BC Managed Care – PPO | Admitting: Physician Assistant

## 2022-11-26 ENCOUNTER — Telehealth: Payer: Self-pay

## 2022-11-26 ENCOUNTER — Ambulatory Visit (INDEPENDENT_AMBULATORY_CARE_PROVIDER_SITE_OTHER)
Admission: RE | Admit: 2022-11-26 | Discharge: 2022-11-26 | Disposition: A | Discharge status: Home / Self Care | Payer: BC Managed Care – PPO | Source: Ambulatory Visit | Attending: Vascular Surgery | Admitting: Vascular Surgery

## 2022-11-26 ENCOUNTER — Ambulatory Visit (HOSPITAL_COMMUNITY)
Admission: RE | Admit: 2022-11-26 | Discharge: 2022-11-26 | Disposition: A | Discharge status: Home / Self Care | Payer: BC Managed Care – PPO | Source: Ambulatory Visit | Attending: Vascular Surgery | Admitting: Vascular Surgery

## 2022-11-26 VITALS — BP 188/104 | HR 71 | Temp 97.8°F | Ht 66.0 in | Wt 153.0 lb

## 2022-11-26 DIAGNOSIS — I739 Peripheral vascular disease, unspecified: Secondary | ICD-10-CM

## 2022-11-26 DIAGNOSIS — I714 Abdominal aortic aneurysm, without rupture, unspecified: Secondary | ICD-10-CM | POA: Diagnosis not present

## 2022-11-26 DIAGNOSIS — I70211 Atherosclerosis of native arteries of extremities with intermittent claudication, right leg: Secondary | ICD-10-CM

## 2022-11-26 LAB — VAS US ABI WITH/WO TBI
Left ABI: 1.01
Right ABI: 0.93

## 2022-11-26 NOTE — Progress Notes (Addendum)
[facility] [place] HISTORY AND PHYSICAL   History of Present Illness:  Patient is a 72 y.o. year old female who presents for evaluation of life limiting claudication.  S/P angiogram findings of SFA occlusion ultimately requiring Right SFA to above-knee popliteal artery bypass with ipsilateral, translocated, reversed greater saphenous vein.    She has been doing well over all until a few months ago.  She states she has had return of calf pain with ambulation.  She denise rest pain and non healing wounds.  She states it just doesn't feel right.  She continues to try and stay busy.  Works at [employer].  She   has history of  very short distance claudication which was preventing her from adequately caring out her job.  These symptoms are simular to prior to surgery.     The pt is on a statin for cholesterol management.    The pt is on an aspirin.    Other AC:  none The pt is on BB, CCB for hypertension.  The pt does have diabetes. Tobacco hx:  former   She Has admitted to smoking again.       Past Medical History:  Diagnosis Date   Anxiety    Arthritis    shoulder and knees   CAD (coronary artery disease)    Complication of anesthesia    states she was still awake one time when surgery started   DM (diabetes mellitus) (HCC)    HTN (hypertension)    Hyperlipemia    Myocardial infarction [facility])    Peripheral vascular disease (HCC)    Pneumonia    Sleep apnea    no cpap   Stroke [facility])    2012    Past Surgical History:  Procedure Laterality Date   ABDOMINAL AORTOGRAM W/LOWER EXTREMITY Bilateral 02/11/2021   Procedure: ABDOMINAL AORTOGRAM W/LOWER EXTREMITY;  Surgeon: Maeola Harman, MD;  Location: [facility] INVASIVE CV LAB;  Service: Cardiovascular;  Laterality: Bilateral;   CARDIAC CATHETERIZATION     COLONOSCOPY     COLONOSCOPY  09/25/2011   Procedure: COLONOSCOPY;  Surgeon: Vertell Novak., MD;  Location: [facility] ENDOSCOPY;  Service: Endoscopy;  Laterality:  N/A;   CORONARY STENT PLACEMENT     Ostial LAD stent in 2012   FEMORAL-POPLITEAL BYPASS GRAFT Right 02/27/2021   Procedure: RIGHT COMMON FEMORAL- ABOVE KNEE POPLITEAL ARTERY BYPASS GRAFT;  Surgeon: Maeola Harman, MD;  Location: [facility] OR;  Service: Vascular;  Laterality: Right;   Left knee surgery      ROS:   General:  No weight loss, Fever, chills  HEENT: No recent headaches, no nasal bleeding, no visual changes, no sore throat  Neurologic: No dizziness, blackouts, seizures. No recent symptoms of stroke or mini- stroke. No recent episodes of slurred speech, or temporary blindness.  Cardiac: No recent episodes of chest pain/pressure, no shortness of breath at rest.  No shortness of breath with exertion.  Denies history of atrial fibrillation or irregular heartbeat  Vascular: No history of rest pain in feet.  No history of claudication.  No history of non-healing ulcer, No history of DVT   Pulmonary: No home oxygen, no productive cough, no hemoptysis,  No asthma or wheezing  Musculoskeletal:  [ ]  Arthritis, [ ]  Low back pain,  [ ]  Joint pain  Hematologic:No history of hypercoagulable state.  No history of easy bleeding.  No history of anemia  Gastrointestinal: No hematochezia or melena,  No gastroesophageal reflux, no trouble swallowing  Urinary: [ ]  chronic Kidney disease, [ ]  on HD - [ ]  MWF or [ ]  TTHS, [ ]  Burning with urination, [ ]  Frequent urination, [ ]  Difficulty urinating;   Skin: No rashes  Psychological: No history of anxiety,  No history of depression  Social History Social History   Tobacco Use   Smoking status: Every Day    Packs/day: 0.25    Years: 15.00    Total pack years: 3.75    Types: Cigarettes    Last attempt to quit: 02/27/2021    Years since quitting: 1.7   Smokeless tobacco: Never   Tobacco comments:    4-5 daily  Vaping Use   Vaping Use: Never used  Substance Use Topics   Alcohol use: Not Currently    Alcohol/week: 1.0 standard drink  of alcohol    Types: 1 Standard drinks or equivalent per week    Comment: occasionally    Drug use: No    Family History Family History  Problem Relation Age of Onset   Heart attack Mother    Anesthesia problems Neg Hx    Hypotension Neg Hx    Malignant hyperthermia Neg Hx    Pseudochol deficiency Neg Hx     Allergies  Allergies  Allergen Reactions   Benadryl [Diphenhydramine Hcl (Sleep)] Other (See Comments)    unknown   Rosuvastatin Hives    Patient is allergic to generic rosuvastatin. She says she is able to take branded Crestor.    Simvastatin Other (See Comments)    unknown     Current Outpatient Medications  Medication Sig Dispense Refill   ACCU-CHEK AVIVA PLUS test strip USE AS INSTRUCTED 100 strip 0   Accu-Chek FastClix Lancets MISC USE AS INSTRUCTED 102 each 11   Accu-Chek FastClix Lancets MISC Use daily as directed 102 each 3   ACCU-CHEK GUIDE test strip USE AS INSTRUCTED 100 strip 1   acetaminophen-codeine (TYLENOL #3) 300-30 MG tablet TAKE 1 TABLET BY MOUTH EVERY 12 HOURS AS NEEDED FOR MODERATE PAIN 60 tablet 1   aspirin EC 81 MG tablet Take 81 mg by mouth in the morning. Swallow whole.     Blood Glucose Monitoring Suppl (ACCU-CHEK GUIDE ME) w/Device KIT 1 kit by Does not apply route daily. Use to check blood sugar daily. 1 kit 0   carboxymethylcellul-glycerin (LUBRICANT DROPS/DUAL-ACTION) 0.5-0.9 % ophthalmic solution Place 1-2 drops into both eyes 3 (three) times daily as needed for dry eyes.     carvedilol (COREG) 12.5 MG tablet TAKE 1 TABLET(12.5 MG) BY MOUTH TWICE DAILY WITH A MEAL 180 tablet 0   cetirizine (ZYRTEC ALLERGY) 10 MG tablet Take 1 tablet (10 mg total) by mouth daily. 14 tablet 0   dapagliflozin propanediol (FARXIGA) 10 MG TABS tablet Take 1 tablet (10 mg total) by mouth daily before breakfast. 90 tablet 1   fluconazole (DIFLUCAN) 150 MG tablet Take 1 tablet (150 mg total) by mouth every 3 (three) days. 5 tablet 0   gabapentin (NEURONTIN) 100 MG  capsule Take 1 capsule (100 mg total) by mouth 3 (three) times daily. 90 capsule 3   glipiZIDE (GLIPIZIDE XL) 10 MG 24 hr tablet Take 1 tablet (10 mg total) by mouth daily with breakfast. 90 tablet 1   glucose blood test strip Use as instructed 100 each 12   hydrocortisone cream 1 % Apply to affected area 2 times daily 15 g 0   losartan (COZAAR) 25 MG tablet Take 1 tablet (25 mg total) by mouth  daily. 90 tablet 1   metroNIDAZOLE (FLAGYL) 500 MG tablet Take 1 tablet (500 mg total) by mouth 2 (two) times daily. 14 tablet 0   Misc. Devices MISC Shower bench, Above the toilet seat.  Diagnosis- Type 2 Diabetes Mellitus 1 each 0   Multiple Vitamin (MULTIVITAMIN WITH MINERALS) TABS tablet Take 1 tablet by mouth in the morning.     nicotine (NICODERM CQ - DOSED IN MG/24 HOURS) 14 mg/24hr patch Place 1 patch (14 mg total) onto the skin daily. 28 patch 0   nitroGLYCERIN (NITROSTAT) 0.4 MG SL tablet Place 0.4 mg under the tongue every 5 (five) minutes as needed for chest pain.     PRESCRIPTION MEDICATION Apply 1 application topically in the morning and at bedtime. Kamea Foot Cream     No current facility-administered medications for this visit.    Physical Examination  Vitals:   11/26/22 1055  BP: (!) 188/104  Pulse: 71  Temp: 97.8 F (36.6 C)  TempSrc: Temporal  SpO2: 98%  Weight: 153 lb (69.4 kg)  Height: 5\' 6"  (1.676 m)    Body mass index is 24.69 kg/m.  General:  Alert and oriented, no acute distress HEENT: Normal Neck: No bruit or JVD Pulmonary: Clear to auscultation bilaterally Cardiac: Regular Rate and Rhythm without murmur Abdomen: Soft, non-tender, non-distended, no mass, no scars Skin: No rash Extremity Pulses:   radial,  femoral, dorsalis pedis,  pulses bilaterally Musculoskeletal: No deformity or edema, left knee OA pain  Neurologic: Upper and lower extremity motor 5/5 and symmetric  DATA:  ABI Findings:  +---------+------------------+-----+-----------+--------+  Right    Rt Pressure (mmHg)IndexWaveform   Comment   +---------+------------------+-----+-----------+--------+  Brachial 179                                         +---------+------------------+-----+-----------+--------+  PTA     167               0.93 multiphasic          +---------+------------------+-----+-----------+--------+  DP      161               0.90 multiphasic          +---------+------------------+-----+-----------+--------+  Great Toe136               0.76 Normal               +---------+------------------+-----+-----------+--------+   +---------+------------------+-----+-----------+-------+  Left    Lt Pressure (mmHg)IndexWaveform   Comment  +---------+------------------+-----+-----------+-------+  Brachial 173                                        +---------+------------------+-----+-----------+-------+  PTA     177               0.99 multiphasic         +---------+------------------+-----+-----------+-------+  DP      181               1.01 multiphasic         +---------+------------------+-----+-----------+-------+  Great Toe163               0.91 Normal              +---------+------------------+-----+-----------+-------+   +-------+-----------+-----------+------------+------------+  ABI/TBIToday's ABIToday's TBIPrevious ABIPrevious TBI  +-------+-----------+-----------+------------+------------+  Right 0.93       0.76       0.98        0.69          +-------+-----------+-----------+------------+------------+  Left  1.01       0.91       1.10        0.75          +-------+-----------+-----------+------------+------------+        Arterial wall calcification precludes accurate ankle pressures and ABIs.  Bilateral ABIs appear decreased. Bilateral TBIs appear increased. Left ABI  is decreased but still in the normal range.    Summary:  Right: Resting right ankle-brachial index indicates  mild right lower  extremity arterial disease. The right toe-brachial index is normal.   Left: Resting left ankle-brachial index is within normal range. The left  toe-brachial index is normal.   Abdominal Aorta Findings:  +-----------+-------+----------+----------+---------+--------+-------------  ----+  Location  AP (cm)Trans (cm)PSV (cm/s)Waveform ThrombusComments            +-----------+-------+----------+----------+---------+--------+-------------  ----+  Proximal  1.98   2.00      59        triphasic                            +-----------+-------+----------+----------+---------+--------+-------------  ----+  Mid       1.50   1.52      52        triphasic                            +-----------+-------+----------+----------+---------+--------+-------------  ----+  Distal    1.81   2.04      60        triphasic        Dilated just                                                               before                                                                     bifurcation         +-----------+-------+----------+----------+---------+--------+-------------  ----+  RT CIA Prox1.2    1.2       59        triphasic                            +-----------+-------+----------+----------+---------+--------+-------------  ----+  LT CIA Prox1.7    1.8       60        triphasic                            +-----------+-------+----------+----------+---------+--------+-------------  ----+  LT CIA Mid                  44        triphasic                            +-----------+-------+----------+----------+---------+--------+-------------  ----+  Summary:  Abdominal Aorta: The largest aortic measurement is 2.0 cm. The Distal  Aorta is slightly dilated just before the Bifurcation.    Right Graft #1: Right Femoral Popliteal artery bypass graft   +------------------+--------+-------------+---------+----------------------  ----+                   PSV cm/sStenosis     Waveform Comments                     +------------------+--------+-------------+---------+----------------------  ----+  Inflow           73                   biphasic                              +------------------+--------+-------------+---------+----------------------  ----+  Prox Anastomosis  83                   biphasic                              +------------------+--------+-------------+---------+----------------------  ----+  Proximal Graft    103                  triphasic                             +------------------+--------+-------------+---------+----------------------  ----+  Mid Graft         420     >70% stenosistriphasicNo plaque visualized  but                                                    there is irregular  flow                                                     pattern on Color  Doppler    +------------------+--------+-------------+---------+----------------------  ----+  Distal Graft      55                   triphasic                             +------------------+--------+-------------+---------+----------------------  ----+  Distal Anastomosis77                   triphasic                             +------------------+--------+-------------+---------+----------------------  ----+  Outflow          41                   biphasic                              +------------------+--------+-------------+---------+----------------------  ----+      Summary:  Right: Patent femoral-popliteal artery bypass graft with >70% stenosis in  the prox-mid  graft.        ASSESSMENT/PLAN:  S/P angiogram findings of SFA occlusion ultimately requiring Right SFA to above-knee popliteal artery bypass with ipsilateral, translocated, reversed greater saphenous vein.   02/27/21 by Dr. Randie Heinz   She has had mild claudication symptoms return over the past 2-3 months.  No rest pain or non healing wounds.  The Bypass duplex demonstrates PSV of 420 cm/s > 70% stenosis.  The TBI's have improved, with a slight decline in ABI's B.  The tibials are improved with multiphasic flow, but the bypass stenosis of > 70% could demonstrate impending occlusion.    The AAA duplex shows   Abdominal Aorta: The largest aortic measurement is 2.0 cm. The Distal  Aorta is slightly dilated just before the Bifurcation.    Based on her symptoms of claudication and stenosis > 70 % in the vein bypass I will schedule her for angiogram with right LE runoff and possible intervention to prevent pending occlusion.      Mosetta Pigeon PA-C [facility] [place] Office: [phone]  MD in clinic [place]

## 2022-11-26 NOTE — Telephone Encounter (Signed)
Contacted patient to schedule aortogram procedure. Patient stated she will have to call our office back when she ready to schedule because she has a lot of other things going on currently.

## 2022-12-08 ENCOUNTER — Ambulatory Visit: Payer: BC Managed Care – PPO | Attending: Family Medicine | Admitting: Family Medicine

## 2022-12-08 ENCOUNTER — Encounter: Payer: Self-pay | Admitting: Family Medicine

## 2022-12-08 VITALS — BP 158/92 | HR 80 | Ht 66.0 in | Wt 155.8 lb

## 2022-12-08 DIAGNOSIS — E1159 Type 2 diabetes mellitus with other circulatory complications: Secondary | ICD-10-CM

## 2022-12-08 DIAGNOSIS — G44209 Tension-type headache, unspecified, not intractable: Secondary | ICD-10-CM

## 2022-12-08 DIAGNOSIS — E785 Hyperlipidemia, unspecified: Secondary | ICD-10-CM

## 2022-12-08 DIAGNOSIS — E1142 Type 2 diabetes mellitus with diabetic polyneuropathy: Secondary | ICD-10-CM | POA: Diagnosis not present

## 2022-12-08 DIAGNOSIS — I739 Peripheral vascular disease, unspecified: Secondary | ICD-10-CM

## 2022-12-08 DIAGNOSIS — R5383 Other fatigue: Secondary | ICD-10-CM | POA: Diagnosis not present

## 2022-12-08 DIAGNOSIS — M546 Pain in thoracic spine: Secondary | ICD-10-CM

## 2022-12-08 DIAGNOSIS — E1169 Type 2 diabetes mellitus with other specified complication: Secondary | ICD-10-CM

## 2022-12-08 DIAGNOSIS — I152 Hypertension secondary to endocrine disorders: Secondary | ICD-10-CM

## 2022-12-08 LAB — POCT GLYCOSYLATED HEMOGLOBIN (HGB A1C): HbA1c, POC (controlled diabetic range): 6.5 % (ref 0.0–7.0)

## 2022-12-08 LAB — GLUCOSE, POCT (MANUAL RESULT ENTRY): POC Glucose: 111 mg/dl — AB (ref 70–99)

## 2022-12-08 MED ORDER — LOSARTAN POTASSIUM 50 MG PO TABS: 50.0000 mg | ORAL_TABLET | Freq: Every day | ORAL | 1 refills | Status: DC

## 2022-12-08 MED ORDER — ATORVASTATIN CALCIUM 20 MG PO TABS: 20.0000 mg | ORAL_TABLET | Freq: Every day | ORAL | 1 refills | Status: DC

## 2022-12-08 NOTE — Progress Notes (Signed)
Back pain. Medication making her head hurt she thinks its the Atorvastatin.

## 2022-12-08 NOTE — Patient Instructions (Signed)
Managing Your Hypertension Hypertension, also called high blood pressure, is when the force of the blood pressing against the walls of the arteries is too strong. Arteries are blood vessels that carry blood from your heart throughout your body. Hypertension forces the heart to work harder to pump blood and may cause the arteries to become narrow or stiff. Understanding blood pressure readings A blood pressure reading includes a higher number over a lower number: The first, or top, number is called the systolic pressure. It is a measure of the pressure in your arteries as your heart beats. The second, or bottom number, is called the diastolic pressure. It is a measure of the pressure in your arteries as the heart relaxes. For most people, a normal blood pressure is below 120/80. Your personal target blood pressure may vary depending on your medical conditions, your age, and other factors. Blood pressure is classified into four stages. Based on your blood pressure reading, your health care provider may use the following stages to determine what type of treatment you need, if any. Systolic pressure and diastolic pressure are measured in a unit called millimeters of mercury (mmHg). Normal Systolic pressure: below 120. Diastolic pressure: below 80. Elevated Systolic pressure: 120-129. Diastolic pressure: below 80. Hypertension stage 1 Systolic pressure: 130-139. Diastolic pressure: 80-89. Hypertension stage 2 Systolic pressure: 140 or above. Diastolic pressure: 90 or above. How can this condition affect me? Managing your hypertension is very important. Over time, hypertension can damage the arteries and decrease blood flow to parts of the body, including the brain, heart, and kidneys. Having untreated or uncontrolled hypertension can lead to: A heart attack. A stroke. A weakened blood vessel (aneurysm). Heart failure. Kidney damage. Eye damage. Memory and concentration problems. Vascular  dementia. What actions can I take to manage this condition? Hypertension can be managed by making lifestyle changes and possibly by taking medicines. Your health care provider will help you make a plan to bring your blood pressure within a normal range. You may be referred for counseling on a healthy diet and physical activity. Nutrition  Eat a diet that is high in fiber and potassium, and low in salt (sodium), added sugar, and fat. An example eating plan is called the DASH diet. DASH stands for Dietary Approaches to Stop Hypertension. To eat this way: Eat plenty of fresh fruits and vegetables. Try to fill one-half of your plate at each meal with fruits and vegetables. Eat whole grains, such as whole-wheat pasta, brown rice, or whole-grain bread. Fill about one-fourth of your plate with whole grains. Eat low-fat dairy products. Avoid fatty cuts of meat, processed or cured meats, and poultry with skin. Fill about one-fourth of your plate with lean proteins such as fish, chicken without skin, beans, eggs, and tofu. Avoid pre-made and processed foods. These tend to be higher in sodium, added sugar, and fat. Reduce your daily sodium intake. Many people with hypertension should eat less than 1,500 mg of sodium a day. Lifestyle  Work with your health care provider to maintain a healthy body weight or to lose weight. Ask what an ideal weight is for you. Get at least 30 minutes of exercise that causes your heart to beat faster (aerobic exercise) most days of the week. Activities may include walking, swimming, or biking. Include exercise to strengthen your muscles (resistance exercise), such as weight lifting, as part of your weekly exercise routine. Try to do these types of exercises for 30 minutes at least 3 days a week. Do   not use any products that contain nicotine or tobacco. These products include cigarettes, chewing tobacco, and vaping devices, such as e-cigarettes. If you need help quitting, ask your  health care provider. Control any long-term (chronic) conditions you have, such as high cholesterol or diabetes. Identify your sources of stress and find ways to manage stress. This may include meditation, deep breathing, or making time for fun activities. Alcohol use Do not drink alcohol if: Your health care provider tells you not to drink. You are pregnant, may be pregnant, or are planning to become pregnant. If you drink alcohol: Limit how much you have to: 0-1 drink a day for women. 0-2 drinks a day for men. Know how much alcohol is in your drink. In the [place] one drink equals one 12 oz bottle of beer (355 mL), one 5 oz glass of wine (148 mL), or one 1 oz glass of hard liquor (44 mL). Medicines Your health care provider may prescribe medicine if lifestyle changes are not enough to get your blood pressure under control and if: Your systolic blood pressure is 130 or higher. Your diastolic blood pressure is 80 or higher. Take medicines only as told by your health care provider. Follow the directions carefully. Blood pressure medicines must be taken as told by your health care provider. The medicine does not work as well when you skip doses. Skipping doses also puts you at risk for problems. Monitoring Before you monitor your blood pressure: Do not smoke, drink caffeinated beverages, or exercise within 30 minutes before taking a measurement. Use the bathroom and empty your bladder (urinate). Sit quietly for at least 5 minutes before taking measurements. Monitor your blood pressure at home as told by your health care provider. To do this: Sit with your back straight and supported. Place your feet flat on the floor. Do not cross your legs. Support your arm on a flat surface, such as a table. Make sure your upper arm is at heart level. Each time you measure, take two or three readings one minute apart and record the results. You may also need to have your blood pressure checked regularly by  your health care provider. General information Talk with your health care provider about your diet, exercise habits, and other lifestyle factors that may be contributing to hypertension. Review all the medicines you take with your health care provider because there may be side effects or interactions. Keep all follow-up visits. Your health care provider can help you create and adjust your plan for managing your high blood pressure. Where to find more information National Heart, Lung, and Blood Institute: www.nhlbi.nih.gov American Heart Association: www.heart.org Contact a health care provider if: You think you are having a reaction to medicines you have taken. You have repeated (recurrent) headaches. You feel dizzy. You have swelling in your ankles. You have trouble with your vision. Get help right away if: You develop a severe headache or confusion. You have unusual weakness or numbness, or you feel faint. You have severe pain in your chest or abdomen. You vomit repeatedly. You have trouble breathing. These symptoms may be an emergency. Get help right away. Call 911. Do not wait to see if the symptoms will go away. Do not drive yourself to the hospital. Summary Hypertension is when the force of blood pumping through your arteries is too strong. If this condition is not controlled, it may put you at risk for serious complications. Your personal target blood pressure may vary depending on your medical conditions,   your age, and other factors. For most people, a normal blood pressure is less than 120/80. Hypertension is managed by lifestyle changes, medicines, or both. Lifestyle changes to help manage hypertension include losing weight, eating a healthy, low-sodium diet, exercising more, stopping smoking, and limiting alcohol. This information is not intended to replace advice given to you by your health care provider. Make sure you discuss any questions you have with your health care  provider. Document Revised: 06/20/2021 Document Reviewed: 06/20/2021 Elsevier Patient Education  2023 [employer]  
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
## 2022-12-09 ENCOUNTER — Other Ambulatory Visit: Payer: Self-pay

## 2022-12-09 DIAGNOSIS — R5383 Other fatigue: Secondary | ICD-10-CM

## 2022-12-09 LAB — CMP14+EGFR
ALT: 17 IU/L (ref 0–32)
AST: 18 IU/L (ref 0–40)
Albumin/Globulin Ratio: 1.8 (ref 1.2–2.2)
Albumin: 4.2 g/dL (ref 3.8–4.8)
Alkaline Phosphatase: 122 IU/L — ABNORMAL HIGH (ref 44–121)
BUN/Creatinine Ratio: 12 (ref 12–28)
BUN: 12 mg/dL (ref 8–27)
Bilirubin Total: 0.8 mg/dL (ref 0.0–1.2)
CO2: 24 mmol/L (ref 20–29)
Calcium: 10.1 mg/dL (ref 8.7–10.3)
Chloride: 106 mmol/L (ref 96–106)
Creatinine, Ser: 0.97 mg/dL (ref 0.57–1.00)
Globulin, Total: 2.3 g/dL (ref 1.5–4.5)
Glucose: 89 mg/dL (ref 70–99)
Potassium: 4.4 mmol/L (ref 3.5–5.2)
Sodium: 145 mmol/L — ABNORMAL HIGH (ref 134–144)
Total Protein: 6.5 g/dL (ref 6.0–8.5)
eGFR: 62 mL/min/{1.73_m2} (ref >59)

## 2022-12-09 LAB — LP+NON-HDL CHOLESTEROL
Cholesterol, Total: 133 mg/dL (ref 100–199)
HDL: 65 mg/dL (ref >39)
LDL Chol Calc (NIH): 55 mg/dL (ref 0–99)
Total Non-HDL-Chol (LDL+VLDL): 68 mg/dL (ref 0–129)
Triglycerides: 61 mg/dL (ref 0–149)
VLDL Cholesterol Cal: 13 mg/dL (ref 5–40)

## 2022-12-10 ENCOUNTER — Other Ambulatory Visit: Payer: Self-pay

## 2022-12-10 LAB — SPECIMEN STATUS REPORT

## 2022-12-10 LAB — VITAMIN D 25 HYDROXY (VIT D DEFICIENCY, FRACTURES): Vit D, 25-Hydroxy: 31.4 ng/mL (ref 30.0–100.0)

## 2022-12-17 ENCOUNTER — Other Ambulatory Visit: Payer: Self-pay

## 2022-12-17 MED ORDER — FLUCONAZOLE 150 MG PO TABS: 150.0000 mg | ORAL_TABLET | Freq: Once | ORAL | 0 refills | Status: AC

## 2022-12-25 ENCOUNTER — Encounter: Payer: Self-pay | Admitting: Radiology

## 2022-12-29 ENCOUNTER — Ambulatory Visit: Payer: BC Managed Care – PPO | Admitting: Family Medicine

## 2023-01-10 ENCOUNTER — Other Ambulatory Visit: Payer: Self-pay | Admitting: Family Medicine

## 2023-01-10 DIAGNOSIS — M1712 Unilateral primary osteoarthritis, left knee: Secondary | ICD-10-CM

## 2023-01-12 NOTE — Telephone Encounter (Signed)
Requested medications are due for refill today.  Provider to determine  Requested medications are on the active medications list. yes  Last refill. Tylenol 3 11/14/2022 #60 1 rf, Unclear regarding test strips  Future visit scheduled.   no  Notes to clinic.  No PCP listed. Tylenol 3 not delegated. Unclear when test strips were last filled.    Requested Prescriptions  Pending Prescriptions Disp Refills   ONETOUCH ULTRA test strip [Pharmacy Med Name: [place] TESTST(NEW)100] 100 strip     Sig: TEST TWICE DAILY     Endocrinology: Diabetes - Testing Supplies Passed - 01/10/2023  5:07 PM      Passed - Valid encounter within last 12 months    Recent Outpatient Visits           1 month ago Type 2 diabetes mellitus with diabetic polyneuropathy, without long-term current use of insulin ([place])   [place], [place], MD   5 months ago Type 2 diabetes mellitus with diabetic polyneuropathy, without long-term current use of insulin ([place])   [place], [place], MD   7 months ago Type 2 diabetes mellitus with diabetic polyneuropathy, without long-term current use of insulin ([place])   [place], [place], MD   1 year ago Type 2 diabetes mellitus with diabetic polyneuropathy, without long-term current use of insulin ([place])   [place], [place], MD   1 year ago Type 2 diabetes mellitus with diabetic polyneuropathy, without long-term current use of insulin ([place])   [place], [place], MD               acetaminophen-codeine (TYLENOL #3) 300-30 MG tablet [Pharmacy Med Name: ACETAMINOPHEN/COD #3 (300/30MG ) TAB] 60 tablet     Sig: TAKE 1 TABLET BY MOUTH EVERY 12 HOURS AS NEEDED FOR MODERATE PAIN     Not Delegated - Analgesics:  Opioid Agonist Combinations 2 Failed - 01/10/2023   5:07 PM      Failed - This refill cannot be delegated      Failed - Urine Drug Screen completed in last 360 days      Passed - Cr in normal range and within 360 days    Creat  Date Value Ref Range Status  11/01/2015 1.20 (H) 0.50 - 0.99 mg/dL Final   Creatinine, Ser  Date Value Ref Range Status  12/08/2022 0.97 0.57 - 1.00 mg/dL Final   Creatinine, Urine  Date Value Ref Range Status  11/01/2015 82 20 - 320 mg/dL Final         Passed - eGFR is 10 or above and within 360 days    GFR, Est African American  Date Value Ref Range Status  01/11/2014 66 mL/min Final   GFR calc Af Amer  Date Value Ref Range Status  02/21/2020 55 (L) >59 mL/min/1.73 Final    Comment:    **[facility] currently reports eGFR in compliance with the current**   recommendations of the [employer]. [facility] will   update reporting as new guidelines are published from the NKF-ASN   Task force.    GFR, Est Non African American  Date Value Ref Range Status  01/11/2014 57 (L) mL/min Final    Comment:      The estimated GFR is a calculation valid for adults (>=30 years old) that uses the CKD-EPI algorithm to  adjust for age and sex. It is   not to be used for children, pregnant women, hospitalized patients,    patients on dialysis, or with rapidly changing kidney function. According to the NKDEP, eGFR >89 is normal, 60-89 shows mild impairment, 30-59 shows moderate impairment, 15-29 shows severe impairment and <15 is ESRD.     GFR, Estimated  Date Value Ref Range Status  02/28/2021 58 (L) >60 mL/min Final    Comment:    (NOTE) Calculated using the CKD-EPI Creatinine Equation (2021)    eGFR  Date Value Ref Range Status  12/08/2022 62 >59 mL/min/1.73 Final         Passed - Patient is not pregnant      Passed - Valid encounter within last 3 months    Recent Outpatient Visits           1 month ago Type 2 diabetes mellitus with diabetic polyneuropathy, without long-term current use  of insulin ([place])   [place], [place], MD   5 months ago Type 2 diabetes mellitus with diabetic polyneuropathy, without long-term current use of insulin ([place])   [place], [place], MD   7 months ago Type 2 diabetes mellitus with diabetic polyneuropathy, without long-term current use of insulin ([place])   [place], [place], MD   1 year ago Type 2 diabetes mellitus with diabetic polyneuropathy, without long-term current use of insulin ([place])   [place], [place], MD   1 year ago Type 2 diabetes mellitus with diabetic polyneuropathy, without long-term current use of insulin [facility])   [place] Charlott Rakes, MD

## 2023-01-15 ENCOUNTER — Other Ambulatory Visit: Payer: Self-pay | Admitting: Family Medicine

## 2023-01-15 DIAGNOSIS — E1142 Type 2 diabetes mellitus with diabetic polyneuropathy: Secondary | ICD-10-CM

## 2023-01-16 NOTE — Telephone Encounter (Signed)
Requested Prescriptions  Pending Prescriptions Disp Refills   FARXIGA 10 MG TABS tablet [Pharmacy Med Name: Farxiga 10 MG Oral Tablet] 90 tablet 0    Sig: TAKE 1 TABLET BY MOUTH ONCE DAILY BEFORE BREAKFAST     Endocrinology:  Diabetes - SGLT2 Inhibitors Passed - 01/15/2023  3:54 PM      Passed - Cr in normal range and within 360 days    Creat  Date Value Ref Range Status  11/01/2015 1.20 (H) 0.50 - 0.99 mg/dL Final   Creatinine, Ser  Date Value Ref Range Status  12/08/2022 0.97 0.57 - 1.00 mg/dL Final   Creatinine, Urine  Date Value Ref Range Status  11/01/2015 82 20 - 320 mg/dL Final         Passed - HBA1C is between 0 and 7.9 and within 180 days    HbA1c, POC (controlled diabetic range)  Date Value Ref Range Status  12/08/2022 6.5 0.0 - 7.0 % Final         Passed - eGFR in normal range and within 360 days    GFR, Est African American  Date Value Ref Range Status  01/11/2014 66 mL/min Final   GFR calc Af Amer  Date Value Ref Range Status  02/21/2020 55 (L) >59 mL/min/1.73 Final    Comment:    **[facility] currently reports eGFR in compliance with the current**   recommendations of the [employer]. [facility] will   update reporting as new guidelines are published from the NKF-ASN   Task force.    GFR, Est Non African American  Date Value Ref Range Status  01/11/2014 57 (L) mL/min Final    Comment:      The estimated GFR is a calculation valid for adults (>=34 years old) that uses the CKD-EPI algorithm to adjust for age and sex. It is   not to be used for children, pregnant women, hospitalized patients,    patients on dialysis, or with rapidly changing kidney function. According to the NKDEP, eGFR >89 is normal, 60-89 shows mild impairment, 30-59 shows moderate impairment, 15-29 shows severe impairment and <15 is ESRD.     GFR, Estimated  Date Value Ref Range Status  02/28/2021 58 (L) >60 mL/min Final    Comment:    (NOTE) Calculated using the  CKD-EPI Creatinine Equation (2021)    eGFR  Date Value Ref Range Status  12/08/2022 62 >59 mL/min/1.73 Final         Passed - Valid encounter within last 6 months    Recent Outpatient Visits           1 month ago Type 2 diabetes mellitus with diabetic polyneuropathy, without long-term current use of insulin ([place])   [place], [place], MD   5 months ago Type 2 diabetes mellitus with diabetic polyneuropathy, without long-term current use of insulin ([place])   [place], [place], MD   7 months ago Type 2 diabetes mellitus with diabetic polyneuropathy, without long-term current use of insulin ([place])   [place], [place], MD   1 year ago Type 2 diabetes mellitus with diabetic polyneuropathy, without long-term current use of insulin ([place])   [place], [place], MD   1 year ago Type 2 diabetes mellitus with diabetic polyneuropathy, without long-term current use of insulin [facility])   [place], [place],  MD

## 2023-01-28 ENCOUNTER — Telehealth: Payer: Self-pay

## 2023-01-28 DIAGNOSIS — E1159 Type 2 diabetes mellitus with other circulatory complications: Secondary | ICD-10-CM

## 2023-01-28 MED ORDER — LOSARTAN POTASSIUM 100 MG PO TABS: 100.0000 mg | ORAL_TABLET | Freq: Every day | ORAL | 1 refills | Status: DC

## 2023-01-28 NOTE — Telephone Encounter (Signed)
I have sent a prescription for an increased dose of losartan 100 mg daily to her pharmacy.  She can take 2 of her 50 mg tablets to make 100 mg in the meantime.

## 2023-01-28 NOTE — Telephone Encounter (Signed)
Pt called and states that her BP has been running high 188/90 and 192/120, she has not been able to do her PT due to her BP being elevated. Pt is wanting to know what to do.

## 2023-01-29 ENCOUNTER — Other Ambulatory Visit: Payer: Self-pay | Admitting: Family Medicine

## 2023-01-29 NOTE — Telephone Encounter (Signed)
Pt has been informed of medication change. 

## 2023-02-03 ENCOUNTER — Other Ambulatory Visit: Payer: Self-pay

## 2023-02-03 DIAGNOSIS — I739 Peripheral vascular disease, unspecified: Secondary | ICD-10-CM

## 2023-02-03 DIAGNOSIS — I70211 Atherosclerosis of native arteries of extremities with intermittent claudication, right leg: Secondary | ICD-10-CM

## 2023-02-04 ENCOUNTER — Other Ambulatory Visit: Payer: Self-pay | Admitting: Pharmacist

## 2023-02-04 ENCOUNTER — Telehealth: Payer: Self-pay

## 2023-02-04 DIAGNOSIS — M1712 Unilateral primary osteoarthritis, left knee: Secondary | ICD-10-CM

## 2023-02-04 DIAGNOSIS — E119 Type 2 diabetes mellitus without complications: Secondary | ICD-10-CM

## 2023-02-04 MED ORDER — ONETOUCH ULTRA VI STRP
ORAL_STRIP | 6 refills | Status: DC
Start: 2023-02-04 — End: 2023-03-10

## 2023-02-04 NOTE — Telephone Encounter (Signed)
Patient came in stating she's having yeast infection symptoms she's wanting refills for Fluconazole  tablets at the [facility] in [facility] [address] Patient would like a call back for when they would be ready.

## 2023-02-09 ENCOUNTER — Encounter (HOSPITAL_COMMUNITY): Payer: Self-pay | Admitting: Vascular Surgery

## 2023-02-09 ENCOUNTER — Other Ambulatory Visit (HOSPITAL_COMMUNITY): Payer: Self-pay

## 2023-02-09 ENCOUNTER — Ambulatory Visit (HOSPITAL_COMMUNITY)
Admission: RE | Admit: 2023-02-09 | Discharge: 2023-02-09 | Disposition: A | Discharge status: Home / Self Care | Payer: BC Managed Care – PPO | Attending: Vascular Surgery | Admitting: Vascular Surgery

## 2023-02-09 ENCOUNTER — Encounter (HOSPITAL_COMMUNITY)
Admission: RE | Disposition: A | Discharge status: Home / Self Care | Payer: Self-pay | Source: Home / Self Care | Attending: Vascular Surgery

## 2023-02-09 ENCOUNTER — Other Ambulatory Visit: Payer: Self-pay

## 2023-02-09 DIAGNOSIS — E119 Type 2 diabetes mellitus without complications: Secondary | ICD-10-CM | POA: Insufficient documentation

## 2023-02-09 DIAGNOSIS — I10 Essential (primary) hypertension: Secondary | ICD-10-CM | POA: Diagnosis not present

## 2023-02-09 DIAGNOSIS — Z7984 Long term (current) use of oral hypoglycemic drugs: Secondary | ICD-10-CM | POA: Diagnosis not present

## 2023-02-09 DIAGNOSIS — I2510 Atherosclerotic heart disease of native coronary artery without angina pectoris: Secondary | ICD-10-CM | POA: Diagnosis not present

## 2023-02-09 DIAGNOSIS — Z8249 Family history of ischemic heart disease and other diseases of the circulatory system: Secondary | ICD-10-CM | POA: Insufficient documentation

## 2023-02-09 DIAGNOSIS — E785 Hyperlipidemia, unspecified: Secondary | ICD-10-CM | POA: Insufficient documentation

## 2023-02-09 DIAGNOSIS — T82858A Stenosis of vascular prosthetic devices, implants and grafts, initial encounter: Secondary | ICD-10-CM | POA: Diagnosis not present

## 2023-02-09 DIAGNOSIS — Y832 Surgical operation with anastomosis, bypass or graft as the cause of abnormal reaction of the patient, or of later complication, without mention of misadventure at the time of the procedure: Secondary | ICD-10-CM | POA: Diagnosis not present

## 2023-02-09 DIAGNOSIS — F17210 Nicotine dependence, cigarettes, uncomplicated: Secondary | ICD-10-CM | POA: Diagnosis not present

## 2023-02-09 DIAGNOSIS — I70211 Atherosclerosis of native arteries of extremities with intermittent claudication, right leg: Secondary | ICD-10-CM | POA: Diagnosis not present

## 2023-02-09 DIAGNOSIS — I739 Peripheral vascular disease, unspecified: Secondary | ICD-10-CM

## 2023-02-09 HISTORY — PX: ABDOMINAL AORTOGRAM W/LOWER EXTREMITY: CATH118223

## 2023-02-09 HISTORY — PX: PERIPHERAL VASCULAR INTERVENTION: CATH118257

## 2023-02-09 LAB — POCT I-STAT, CHEM 8
BUN: 18 mg/dL (ref 8–23)
Calcium, Ion: 1.25 mmol/L (ref 1.15–1.40)
Chloride: 108 mmol/L (ref 98–111)
Creatinine, Ser: 0.9 mg/dL (ref 0.44–1.00)
Glucose, Bld: 119 mg/dL — ABNORMAL HIGH (ref 70–99)
HCT: 46 % (ref 36.0–46.0)
Hemoglobin: 15.6 g/dL — ABNORMAL HIGH (ref 12.0–15.0)
Potassium: 4 mmol/L (ref 3.5–5.1)
Sodium: 144 mmol/L (ref 135–145)
TCO2: 28 mmol/L (ref 22–32)

## 2023-02-09 LAB — GLUCOSE, CAPILLARY: Glucose-Capillary: 77 mg/dL (ref 70–99)

## 2023-02-09 SURGERY — ABDOMINAL AORTOGRAM W/LOWER EXTREMITY
Anesthesia: LOCAL | Laterality: Right

## 2023-02-09 MED ORDER — HEPARIN (PORCINE) IN NACL 1000-0.9 UT/500ML-% IV SOLN
INTRAVENOUS | Status: DC | PRN
  Administered 2023-02-09 (×2): 500 mL

## 2023-02-09 MED ORDER — CLOPIDOGREL BISULFATE 300 MG PO TABS
ORAL_TABLET | ORAL | Status: DC | PRN
  Administered 2023-02-09: 300 mg via ORAL

## 2023-02-09 MED ORDER — HEPARIN SODIUM (PORCINE) 1000 UNIT/ML IJ SOLN
INTRAMUSCULAR | Status: DC | PRN
  Administered 2023-02-09: 5000 [IU] via INTRAVENOUS

## 2023-02-09 MED ORDER — ASPIRIN 81 MG PO CHEW
CHEWABLE_TABLET | ORAL | Status: DC | PRN
  Administered 2023-02-09: 81 mg via ORAL

## 2023-02-09 MED ORDER — CLOPIDOGREL BISULFATE 75 MG PO TABS
75.0000 mg | ORAL_TABLET | Freq: Every day | ORAL | 11 refills | Status: DC
  Filled 2023-02-09: qty 30, 30d supply, fill #0

## 2023-02-09 MED ORDER — ACETAMINOPHEN 325 MG PO TABS: 650.0000 mg | ORAL_TABLET | ORAL | Status: DC | PRN

## 2023-02-09 MED ORDER — LIDOCAINE HCL (PF) 1 % IJ SOLN
INTRAMUSCULAR | Status: DC | PRN
  Administered 2023-02-09: 15 mL

## 2023-02-09 MED ORDER — MIDAZOLAM HCL 2 MG/2ML IJ SOLN
INTRAMUSCULAR | Status: DC | PRN
  Administered 2023-02-09: 1 mg via INTRAVENOUS

## 2023-02-09 MED ORDER — ONDANSETRON HCL 4 MG/2ML IJ SOLN: 4.0000 mg | Freq: Four times a day (QID) | INTRAMUSCULAR | Status: DC | PRN

## 2023-02-09 MED ORDER — MORPHINE SULFATE (PF) 2 MG/ML IV SOLN: 2.0000 mg | INTRAVENOUS | Status: DC | PRN

## 2023-02-09 MED ORDER — SODIUM CHLORIDE 0.9 % WEIGHT BASED INFUSION: 1.0000 mL/kg/h | INTRAVENOUS | Status: DC

## 2023-02-09 MED ORDER — ASPIRIN 81 MG PO CHEW
CHEWABLE_TABLET | ORAL | Status: AC
  Filled 2023-02-09: qty 1

## 2023-02-09 MED ORDER — SODIUM CHLORIDE 0.9% FLUSH: 3.0000 mL | INTRAVENOUS | Status: DC | PRN

## 2023-02-09 MED ORDER — SODIUM CHLORIDE 0.9% FLUSH: 3.0000 mL | Freq: Two times a day (BID) | INTRAVENOUS | Status: DC

## 2023-02-09 MED ORDER — CLOPIDOGREL BISULFATE 300 MG PO TABS
ORAL_TABLET | ORAL | Status: AC
  Filled 2023-02-09: qty 1

## 2023-02-09 MED ORDER — SODIUM CHLORIDE 0.9 % IV SOLN: 250.0000 mL | INTRAVENOUS | Status: DC | PRN

## 2023-02-09 MED ORDER — MIDAZOLAM HCL 2 MG/2ML IJ SOLN
INTRAMUSCULAR | Status: AC
  Filled 2023-02-09: qty 2

## 2023-02-09 MED ORDER — HEPARIN SODIUM (PORCINE) 1000 UNIT/ML IJ SOLN
INTRAMUSCULAR | Status: AC
  Filled 2023-02-09: qty 10

## 2023-02-09 MED ORDER — FENTANYL CITRATE (PF) 100 MCG/2ML IJ SOLN
INTRAMUSCULAR | Status: DC | PRN
  Administered 2023-02-09: 50 ug via INTRAVENOUS

## 2023-02-09 MED ORDER — CLOPIDOGREL BISULFATE 75 MG PO TABS: 300.0000 mg | ORAL_TABLET | Freq: Once | ORAL | Status: AC

## 2023-02-09 MED ORDER — CLOPIDOGREL BISULFATE 75 MG PO TABS: 75.0000 mg | ORAL_TABLET | Freq: Every day | ORAL | Status: DC

## 2023-02-09 MED ORDER — OXYCODONE HCL 5 MG PO TABS: 5.0000 mg | ORAL_TABLET | ORAL | Status: DC | PRN

## 2023-02-09 MED ORDER — LIDOCAINE HCL (PF) 1 % IJ SOLN
INTRAMUSCULAR | Status: AC
  Filled 2023-02-09: qty 30

## 2023-02-09 MED ORDER — ASPIRIN 81 MG PO TBEC: 81.0000 mg | DELAYED_RELEASE_TABLET | Freq: Every day | ORAL | Status: DC

## 2023-02-09 MED ORDER — SODIUM CHLORIDE 0.9 % IV SOLN: INTRAVENOUS | Status: DC

## 2023-02-09 MED ORDER — LABETALOL HCL 5 MG/ML IV SOLN: 10.0000 mg | INTRAVENOUS | Status: DC | PRN

## 2023-02-09 MED ORDER — IODIXANOL 320 MG/ML IV SOLN
INTRAVENOUS | Status: DC | PRN
  Administered 2023-02-09: 80 mL

## 2023-02-09 MED ORDER — HYDRALAZINE HCL 20 MG/ML IJ SOLN: 5.0000 mg | INTRAMUSCULAR | Status: DC | PRN

## 2023-02-09 MED ORDER — FENTANYL CITRATE (PF) 100 MCG/2ML IJ SOLN
INTRAMUSCULAR | Status: AC
  Filled 2023-02-09: qty 2

## 2023-02-09 SURGICAL SUPPLY — 17 items
BALLN MUSTANG 5.0X40 75 (BALLOONS) ×1
BALLOON MUSTANG 5.0X40 75 (BALLOONS) IMPLANT
CATH OMNI FLUSH 5F 65CM (CATHETERS) IMPLANT
CATH STRAIGHT 5FR 65CM (CATHETERS) IMPLANT
GLIDEWIRE ADV .035X260CM (WIRE) IMPLANT
KIT MICROPUNCTURE NIT STIFF (SHEATH) IMPLANT
KIT PV (KITS) ×1 IMPLANT
SHEATH CATAPULT 6FR 45 (SHEATH) IMPLANT
SHEATH PINNACLE 5F 10CM (SHEATH) IMPLANT
SHEATH PINNACLE 6F 10CM (SHEATH) IMPLANT
STENT ELUVIA 6X40X130 (Permanent Stent) IMPLANT
STOPCOCK MORSE 400PSI 3WAY (MISCELLANEOUS) IMPLANT
SYR MEDRAD MARK 7 150ML (SYRINGE) ×1 IMPLANT
TRANSDUCER W/STOPCOCK (MISCELLANEOUS) ×1 IMPLANT
TRAY PV CATH (CUSTOM PROCEDURE TRAY) ×1 IMPLANT
TUBING CIL FLEX 10 FLL-RA (TUBING) IMPLANT
WIRE STARTER BENTSON 035X150 (WIRE) IMPLANT

## 2023-02-09 NOTE — H&P (Signed)
History of Present Illness  72 y.o. year old female who presents for evaluation of life limiting claudication.  S/P angiogram findings of SFA occlusion ultimately requiring Right SFA to above-knee popliteal artery bypass with ipsilateral, translocated, reversed greater saphenous vein.                She has been doing well over all until a few months ago.  She states she has had return of calf pain with ambulation.  She denise rest pain and non healing wounds.  She states it just doesn't feel right.  She continues to try and stay busy.  Works at [employer].  She   has history of  very short distance claudication which was preventing her from adequately caring out her job.  These symptoms are simular to prior to surgery.       The pt is on a statin for cholesterol management.    The pt is on an aspirin.    Other AC:  none The pt is on BB, CCB for hypertension.  The pt does have diabetes. Tobacco hx:  former   She Has admitted to smoking again.             Past Medical History:  Diagnosis Date   Anxiety     Arthritis      shoulder and knees   CAD (coronary artery disease)     Complication of anesthesia      states she was still awake one time when surgery started   DM (diabetes mellitus) (HCC)     HTN (hypertension)     Hyperlipemia     Myocardial infarction [facility])     Peripheral vascular disease (HCC)     Pneumonia     Sleep apnea      no cpap   Stroke [facility])      2012           Past Surgical History:  Procedure Laterality Date   ABDOMINAL AORTOGRAM W/LOWER EXTREMITY Bilateral 02/11/2021    Procedure: ABDOMINAL AORTOGRAM W/LOWER EXTREMITY;  Surgeon: Maeola Harman, MD;  Location: [facility] INVASIVE CV LAB;  Service: Cardiovascular;  Laterality: Bilateral;   CARDIAC CATHETERIZATION       COLONOSCOPY       COLONOSCOPY   09/25/2011    Procedure: COLONOSCOPY;  Surgeon: Vertell Novak., MD;  Location: [facility] ENDOSCOPY;  Service: Endoscopy;  Laterality: N/A;   CORONARY STENT  PLACEMENT        Ostial LAD stent in 2012   FEMORAL-POPLITEAL BYPASS GRAFT Right 02/27/2021    Procedure: RIGHT COMMON FEMORAL- ABOVE KNEE POPLITEAL ARTERY BYPASS GRAFT;  Surgeon: Maeola Harman, MD;  Location: [facility] OR;  Service: Vascular;  Laterality: Right;   Left knee surgery          ROS:    General:  No weight loss, Fever, chills   HEENT: No recent headaches, no nasal bleeding, no visual changes, no sore throat   Neurologic: No dizziness, blackouts, seizures. No recent symptoms of stroke or mini- stroke. No recent episodes of slurred speech, or temporary blindness.   Cardiac: No recent episodes of chest pain/pressure, no shortness of breath at rest.  No shortness of breath with exertion.  Denies history of atrial fibrillation or irregular heartbeat   Vascular: No history of rest pain in feet.  No history of claudication.  No history of non-healing ulcer, No history of DVT    Pulmonary: No home oxygen, no productive cough, no  hemoptysis,  No asthma or wheezing   Musculoskeletal:  [ ]  Arthritis, [ ]  Low back pain,  [ ]  Joint pain   Hematologic:No history of hypercoagulable state.  No history of easy bleeding.  No history of anemia   Gastrointestinal: No hematochezia or melena,  No gastroesophageal reflux, no trouble swallowing   Urinary: [ ]  chronic Kidney disease, [ ]  on HD - [ ]  MWF or [ ]  TTHS, [ ]  Burning with urination, [ ]  Frequent urination, [ ]  Difficulty urinating;    Skin: No rashes   Psychological: No history of anxiety,  No history of depression   Social History Social History         Tobacco Use   Smoking status: Every Day      Packs/day: 0.25      Years: 15.00      Total pack years: 3.75      Types: Cigarettes      Last attempt to quit: 02/27/2021      Years since quitting: 1.7   Smokeless tobacco: Never   Tobacco comments:      4-5 daily  Vaping Use   Vaping Use: Never used  Substance Use Topics   Alcohol use: Not Currently       Alcohol/week: 1.0 standard drink of alcohol      Types: 1 Standard drinks or equivalent per week      Comment: occasionally    Drug use: No      Family History      Family History  Problem Relation Age of Onset   Heart attack Mother     Anesthesia problems Neg Hx     Hypotension Neg Hx     Malignant hyperthermia Neg Hx     Pseudochol deficiency Neg Hx        Allergies        Allergies  Allergen Reactions   Benadryl [Diphenhydramine Hcl (Sleep)] Other (See Comments)      unknown   Rosuvastatin Hives      Patient is allergic to generic rosuvastatin. She says she is able to take branded Crestor.    Simvastatin Other (See Comments)      unknown              Current Outpatient Medications  Medication Sig Dispense Refill   ACCU-CHEK AVIVA PLUS test strip USE AS INSTRUCTED 100 strip 0   Accu-Chek FastClix Lancets MISC USE AS INSTRUCTED 102 each 11   Accu-Chek FastClix Lancets MISC Use daily as directed 102 each 3   ACCU-CHEK GUIDE test strip USE AS INSTRUCTED 100 strip 1   acetaminophen-codeine (TYLENOL #3) 300-30 MG tablet TAKE 1 TABLET BY MOUTH EVERY 12 HOURS AS NEEDED FOR MODERATE PAIN 60 tablet 1   aspirin EC 81 MG tablet Take 81 mg by mouth in the morning. Swallow whole.       Blood Glucose Monitoring Suppl (ACCU-CHEK GUIDE ME) w/Device KIT 1 kit by Does not apply route daily. Use to check blood sugar daily. 1 kit 0   carboxymethylcellul-glycerin (LUBRICANT DROPS/DUAL-ACTION) 0.5-0.9 % ophthalmic solution Place 1-2 drops into both eyes 3 (three) times daily as needed for dry eyes.       carvedilol (COREG) 12.5 MG tablet TAKE 1 TABLET(12.5 MG) BY MOUTH TWICE DAILY WITH A MEAL 180 tablet 0   cetirizine (ZYRTEC ALLERGY) 10 MG tablet Take 1 tablet (10 mg total) by mouth daily. 14 tablet 0   dapagliflozin propanediol (FARXIGA) 10 MG TABS tablet Take  1 tablet (10 mg total) by mouth daily before breakfast. 90 tablet 1   fluconazole (DIFLUCAN) 150 MG tablet Take 1 tablet (150 mg  total) by mouth every 3 (three) days. 5 tablet 0   gabapentin (NEURONTIN) 100 MG capsule Take 1 capsule (100 mg total) by mouth 3 (three) times daily. 90 capsule 3   glipiZIDE (GLIPIZIDE XL) 10 MG 24 hr tablet Take 1 tablet (10 mg total) by mouth daily with breakfast. 90 tablet 1   glucose blood test strip Use as instructed 100 each 12   hydrocortisone cream 1 % Apply to affected area 2 times daily 15 g 0   losartan (COZAAR) 25 MG tablet Take 1 tablet (25 mg total) by mouth daily. 90 tablet 1   metroNIDAZOLE (FLAGYL) 500 MG tablet Take 1 tablet (500 mg total) by mouth 2 (two) times daily. 14 tablet 0   Misc. Devices MISC Shower bench, Above the toilet seat.  Diagnosis- Type 2 Diabetes Mellitus 1 each 0   Multiple Vitamin (MULTIVITAMIN WITH MINERALS) TABS tablet Take 1 tablet by mouth in the morning.       nicotine (NICODERM CQ - DOSED IN MG/24 HOURS) 14 mg/24hr patch Place 1 patch (14 mg total) onto the skin daily. 28 patch 0   nitroGLYCERIN (NITROSTAT) 0.4 MG SL tablet Place 0.4 mg under the tongue every 5 (five) minutes as needed for chest pain.       PRESCRIPTION MEDICATION Apply 1 application topically in the morning and at bedtime. Kamea Foot Cream        No current facility-administered medications for this visit.      Physical Examination   Vitals:   02/09/23 0844  BP: (!) 152/79  Pulse: 64  Resp: 18  Temp: 98.5 F (36.9 C)  SpO2: 98%      General:  Alert and oriented, no acute distress HEENT: Normal Neck: No bruit or JVD Pulmonary: Clear to auscultation bilaterally Cardiac: Regular Rate and Rhythm without murmur Abdomen: Soft, non-tender, non-distended, no mass, no scars Skin: No rash Extremity Pulses:   radial,  femoral, dorsalis pedis,  pulses bilaterally Musculoskeletal: No deformity or edema, left knee OA pain           Neurologic: Upper and lower extremity motor 5/5 and symmetric   DATA:  ABI Findings:  +---------+------------------+-----+-----------+--------+   Right   Rt Pressure (mmHg)IndexWaveform   Comment   +---------+------------------+-----+-----------+--------+  Brachial 179                                         +---------+------------------+-----+-----------+--------+  PTA     167               0.93 multiphasic          +---------+------------------+-----+-----------+--------+  DP      161               0.90 multiphasic          +---------+------------------+-----+-----------+--------+  Great Toe136               0.76 Normal               +---------+------------------+-----+-----------+--------+   +---------+------------------+-----+-----------+-------+  Left    Lt Pressure (mmHg)IndexWaveform   Comment  +---------+------------------+-----+-----------+-------+  Brachial 173                                        +---------+------------------+-----+-----------+-------+  PTA     177               0.99 multiphasic         +---------+------------------+-----+-----------+-------+  DP      181               1.01 multiphasic         +---------+------------------+-----+-----------+-------+  Great Toe163               0.91 Normal              +---------+------------------+-----+-----------+-------+   +-------+-----------+-----------+------------+------------+  ABI/TBIToday's ABIToday's TBIPrevious ABIPrevious TBI  +-------+-----------+-----------+------------+------------+  Right 0.93       0.76       0.98        0.69          +-------+-----------+-----------+------------+------------+  Left  1.01       0.91       1.10        0.75          +-------+-----------+-----------+------------+------------+        Arterial wall calcification precludes accurate ankle pressures and ABIs.  Bilateral ABIs appear decreased. Bilateral TBIs appear increased. Left ABI  is decreased but still in the normal range.    Summary:  Right: Resting right ankle-brachial index  indicates mild right lower  extremity arterial disease. The right toe-brachial index is normal.   Left: Resting left ankle-brachial index is within normal range. The left  toe-brachial index is normal.    Abdominal Aorta Findings:  +-----------+-------+----------+----------+---------+--------+-------------  ----+  Location  AP (cm)Trans (cm)PSV (cm/s)Waveform ThrombusComments            +-----------+-------+----------+----------+---------+--------+-------------  ----+  Proximal  1.98   2.00      59        triphasic                            +-----------+-------+----------+----------+---------+--------+-------------  ----+  Mid       1.50   1.52      52        triphasic                            +-----------+-------+----------+----------+---------+--------+-------------  ----+  Distal    1.81   2.04      60        triphasic        Dilated just                                                               before                                                                     bifurcation         +-----------+-------+----------+----------+---------+--------+-------------  ----+  RT CIA Prox1.2    1.2       59        triphasic                            +-----------+-------+----------+----------+---------+--------+-------------  ----+  LT CIA Prox1.7    1.8       60        triphasic                            +-----------+-------+----------+----------+---------+--------+-------------  ----+  LT CIA Mid                  44        triphasic                            +-----------+-------+----------+----------+---------+--------+-------------  ----+   Summary:  Abdominal Aorta: The largest aortic measurement is 2.0 cm. The Distal  Aorta is slightly dilated just before the Bifurcation.      Right Graft #1: Right Femoral Popliteal artery bypass graft   +------------------+--------+-------------+---------+----------------------  ----+                   PSV cm/sStenosis     Waveform Comments                     +------------------+--------+-------------+---------+----------------------  ----+  Inflow           73                   biphasic                              +------------------+--------+-------------+---------+----------------------  ----+  Prox Anastomosis  83                   biphasic                              +------------------+--------+-------------+---------+----------------------  ----+  Proximal Graft    103                  triphasic                             +------------------+--------+-------------+---------+----------------------  ----+  Mid Graft         420     >70% stenosistriphasicNo plaque visualized  but                                                    there is irregular  flow                                                     pattern on Color  Doppler    +------------------+--------+-------------+---------+----------------------  ----+  Distal Graft      55                   triphasic                             +------------------+--------+-------------+---------+----------------------  ----+  Distal Anastomosis77  triphasic                             +------------------+--------+-------------+---------+----------------------  ----+  Outflow          41                   biphasic                              +------------------+--------+-------------+---------+----------------------  ----+      Summary:  Right: Patent femoral-popliteal artery bypass graft with >70% stenosis in  the prox-mid graft.          ASSESSMENT/PLAN:  S/P angiogram findings of SFA occlusion ultimately requiring Right SFA to above-knee popliteal artery bypass with ipsilateral, translocated, reversed greater saphenous vein.   02/27/21 by Dr. Randie Heinz               She has had mild claudication symptoms return over the past 2-3 months.  No rest pain or non healing wounds.  The Bypass duplex demonstrates PSV of 420 cm/s > 70% stenosis.  The TBI's have improved, with a slight decline in ABI's.  The tibials are improved with multiphasic flow, but the bypass stenosis of > 70% could demonstrate impending occlusion.     The AAA duplex shows    Abdominal Aorta: The largest aortic measurement is 2.0 cm. The Distal  Aorta is slightly dilated just before the Bifurcation.                Based on her symptoms of claudication and stenosis > 70 % in the vein bypass I will schedule her for angiogram with right LE runoff and possible intervention to prevent pending graft failure.     Hammond Obeirne C. Randie Heinz, MD [facility] Office: [phone] Pager: [phone]

## 2023-02-09 NOTE — Op Note (Signed)
Patient name: NELLE SAYED MRN: [id number] DOB: 10/18/1951 Sex: female  02/09/2023 Pre-operative Diagnosis: Recurrent right lower extremity claudication with high-grade bypass graft stenosis Post-operative diagnosis:  Same Surgeon:  Apolinar Junes C. Randie Heinz, MD Procedure Performed: 1.  Ultrasound-guided cannulation left common femoral artery 2.  Catheter aorta and aortogram followed by pelvic angiography with catheter and distal aorta 3.  Bilateral lower extremity angiography 4.  Stent of right femoral to above-knee popliteal artery bypass graft stenosis with 6 x 40 mm Eluvia 5.  Moderate sedation with fentanyl and Versed for 42 minutes 6.  Mynx device closure left common femoral artery  Indications: 72 year old female with a history of right SFA to above-the-knee popliteal artery bypass for life-limiting claudication with attempted revascularization by providers elsewhere.  She now has recurrent claudication with high-grade stenosis identified on the bypass graft duplex recently.  She is indicated for angiography with possible intervention.  Findings: Catheter was placed in the aorta aortogram revealed no significant stenoses with bilateral renal arteries patent.  The common iliac artery and external iliac arteries bilaterally were patent as were the common femoral arteries and takeoff of the SFA and profunda femoris arteries.  In the right lower extremity there is an end-to-end bypass graft that originates from the SFA without any identification of this anastomosis and brisk flow up until the mid graft where there appears to be valve which was actually difficult to cross.  Pullback gradient across this demonstrated a gradient of 75 mmHg.  Distally there is brisk runoff through the distal anastomosis to the above-knee popliteal artery with patency of the popliteal above and below the knee and three-vessel runoff to the foot dominant via the posterior tibial artery.  After stenting of the valve there is  0% residual stenosis by angiography and a 0 gradient across the the stent.    Procedure:  The patient was identified in the holding area and taken to room 8.  The patient was then placed supine on the table and prepped and draped in the usual sterile fashion.  A time out was called.  Ultrasound was used to evaluate the left common femoral artery which was noted to be patent.  The area was anesthetized 1% lidocaine cannulated with micropuncture needle followed by wire and a sheath.  And images saved to the permanent record.  We placed a Bentson wire followed by a 5 French sheath and then placed an Omni catheter to the level of the renal arteries at L1.  Concomitantly moderate sedation was administered with fentanyl and Versed and her vital signs were monitored by bedside nursing throughout the case.  Aortogram was performed followed by pullback to the aortic bifurcation and pelvic angiography with limited bilateral lower extremity angiography was performed.  We then crossed the bifurcation and placed the catheter in the right common femoral artery perform right lower extremity angiography.  With the above findings we then placed a wire down into the bypass graft and placed a long 6 French sheath.  Heparin totaling 5000 units was administered.  We crossed the valve and then did a pullback gradient which was 75 mmHg.  We then recross the valve placed a 6 x 40 mm stent postdilated with 5 mm balloon.  Completion demonstrated no residual stenosis with brisk flow through the stent and there was a 0 gradient across the stent.  Satisfied with this we exchanged for a short 6 [place] sheath advanced device was deployed.  Patient tolerated procedure without immediate complication.  Contrast: 80  cc   Citlally Captain C. Randie Heinz, MD [facility] [place] Office: [phone] Pager: [phone]

## 2023-02-10 MED ORDER — ACETAMINOPHEN-CODEINE 300-30 MG PO TABS
ORAL_TABLET | ORAL | 1 refills | Status: DC
Start: 2023-02-10 — End: 2023-02-23

## 2023-02-10 MED ORDER — FLUCONAZOLE 150 MG PO TABS: 150.0000 mg | ORAL_TABLET | Freq: Every day | ORAL | 0 refills | Status: DC

## 2023-02-10 NOTE — Addendum Note (Signed)
Addended by: Hoy Register on: 02/10/2023 12:55 PM   Modules accepted: Orders

## 2023-02-10 NOTE — Telephone Encounter (Signed)
Done

## 2023-02-10 NOTE — Telephone Encounter (Signed)
Pt states that she has been taking antibiotics and know has a yeast infection pt is requesting 5 diflucan pills to take over time.  Pt is also requesting refill on tylenol #3

## 2023-02-10 NOTE — Telephone Encounter (Signed)
Pt was called and informed of medication being sent.

## 2023-02-22 ENCOUNTER — Other Ambulatory Visit: Payer: Self-pay | Admitting: Family Medicine

## 2023-02-22 DIAGNOSIS — M1712 Unilateral primary osteoarthritis, left knee: Secondary | ICD-10-CM

## 2023-02-23 ENCOUNTER — Telehealth: Payer: Self-pay

## 2023-02-23 NOTE — Telephone Encounter (Signed)
Called Tammy Graham and relayed Dr. Darcella Cheshire msg. She stated that she is still taking the ASA, but she thinks it may be the pain medication. She has been taking it long term, but noticed worsening itching after taking a dose.   Tammy Graham will be obtaining paperwork to be excused from work to get filled out in the office. She lifts 30-50 lbs over her head and is causing her pain. Instructed her to lift no more than 10 lbs.

## 2023-02-23 NOTE — Telephone Encounter (Signed)
-----   Message from Maeola Harman, MD sent at 02/23/2023  2:50 PM EDT ----- Regarding: RE: Itching She can discontinue Plavix as long as she is taking aspirin.  Otherwise she would need Brilinta.  Apolinar Junes ----- Message ----- From: Hurshel Keys, RN Sent: 02/23/2023  12:12 PM EDT To: Maeola Harman, MD Subject: Itching                                        Dr. Randie Heinz,  This pt is c/o all over itching, that has continued to worsen. She thinks it's the Plavix, but she does have some other meds on board. Sam, PA told me to ask you and see if she should switch to something else. Please advise.  Thanks, Lawanna Kobus, RN - Triage

## 2023-02-27 ENCOUNTER — Other Ambulatory Visit: Payer: Self-pay

## 2023-02-27 MED ORDER — FLUCONAZOLE 150 MG PO TABS: 150.0000 mg | ORAL_TABLET | Freq: Every day | ORAL | 0 refills | Status: DC

## 2023-03-01 ENCOUNTER — Other Ambulatory Visit: Payer: Self-pay | Admitting: Family Medicine

## 2023-03-01 DIAGNOSIS — E1142 Type 2 diabetes mellitus with diabetic polyneuropathy: Secondary | ICD-10-CM

## 2023-03-02 ENCOUNTER — Ambulatory Visit (INDEPENDENT_AMBULATORY_CARE_PROVIDER_SITE_OTHER): Payer: BC Managed Care – PPO | Admitting: Physician Assistant

## 2023-03-02 ENCOUNTER — Encounter: Payer: Self-pay | Admitting: Physician Assistant

## 2023-03-02 ENCOUNTER — Other Ambulatory Visit: Payer: Self-pay

## 2023-03-02 ENCOUNTER — Telehealth: Payer: Self-pay

## 2023-03-02 ENCOUNTER — Telehealth: Payer: Self-pay | Admitting: Orthopaedic Surgery

## 2023-03-02 VITALS — Ht 66.0 in | Wt 140.0 lb

## 2023-03-02 DIAGNOSIS — M1712 Unilateral primary osteoarthritis, left knee: Secondary | ICD-10-CM

## 2023-03-02 DIAGNOSIS — I70211 Atherosclerosis of native arteries of extremities with intermittent claudication, right leg: Secondary | ICD-10-CM

## 2023-03-02 NOTE — Telephone Encounter (Signed)
Patient walked into office today requesting FMLA forms to be completed because she was told if she was unable to do her job she would need FMLA. Pt stated her legs began to bother her after she returned to work 2 days after the procedure on 02/11/23. I advised that the procedure she had doesn't require time off or out of work, also that Dr. Randie Heinz did not indicate that she needed to be out after the procedure. Pt was frustrated stating she can not work due to her leg pains so Post-op appt moved to sooner date with studies. She describes her pains to be 10/10, constant achy & pinching sensation, she noted some pink/redness at the groin with no drainage or opened areas. Appt r/s to 03/11/23. Patient voiced her understanding.

## 2023-03-02 NOTE — Telephone Encounter (Signed)
Requested medication (s) are due for refill today - no  Requested medication (s) are on the active medication list -yes  Future visit scheduled -yes  Last refill: 01/29/23 #90  Notes to clinic: Please review there are 2 doses listed on current medication list- possibly should be taking 20 mg and not 10mg   Requested Prescriptions  Pending Prescriptions Disp Refills   atorvastatin (LIPITOR) 10 MG tablet [Pharmacy Med Name: ATORVASTATIN 10MG  TABLETS] 90 tablet 0    Sig: TAKE 1 TABLET BY MOUTH EVERY DAY     Cardiovascular:  Antilipid - Statins Failed - 03/01/2023 12:16 PM      Failed - Lipid Panel in normal range within the last 12 months    Cholesterol, Total  Date Value Ref Range Status  12/08/2022 133 100 - 199 mg/dL Final   LDL Chol Calc (NIH)  Date Value Ref Range Status  12/08/2022 55 0 - 99 mg/dL Final   HDL  Date Value Ref Range Status  12/08/2022 65 >39 mg/dL Final   Triglycerides  Date Value Ref Range Status  12/08/2022 61 0 - 149 mg/dL Final         Passed - Patient is not pregnant      Passed - Valid encounter within last 12 months    Recent Outpatient Visits           2 months ago Type 2 diabetes mellitus with diabetic polyneuropathy, without long-term current use of insulin (HCC)   [place] [facility] [place], [place], MD   6 months ago Type 2 diabetes mellitus with diabetic polyneuropathy, without long-term current use of insulin (HCC)   [place] [facility] [place], [place], MD   9 months ago Type 2 diabetes mellitus with diabetic polyneuropathy, without long-term current use of insulin (HCC)   [place] [facility] [place], [place], MD   1 year ago Type 2 diabetes mellitus with diabetic polyneuropathy, without long-term current use of insulin (HCC)   [place] [facility] [place], [place], MD   1 year ago Type 2 diabetes mellitus with diabetic polyneuropathy,  without long-term current use of insulin (HCC)   [place] [facility] [place], Odette Horns, MD       Future Appointments             In 1 week Hoy Register, MD [place] [facility]            Signed Prescriptions Disp Refills   glipiZIDE (GLUCOTROL XL) 10 MG 24 hr tablet 90 tablet 1    Sig: TAKE 1 TABLET(10 MG) BY MOUTH DAILY WITH BREAKFAST* DISCONTINUE IMMEDIATE RELEASE GLIPIZIDE*     Endocrinology:  Diabetes - Sulfonylureas Passed - 03/01/2023 12:16 PM      Passed - HBA1C is between 0 and 7.9 and within 180 days    HbA1c, POC (controlled diabetic range)  Date Value Ref Range Status  12/08/2022 6.5 0.0 - 7.0 % Final         Passed - Cr in normal range and within 360 days    Creat  Date Value Ref Range Status  11/01/2015 1.20 (H) 0.50 - 0.99 mg/dL Final   Creatinine, Ser  Date Value Ref Range Status  02/09/2023 0.90 0.44 - 1.00 mg/dL Final   Creatinine, Urine  Date Value Ref Range Status  11/01/2015 82 20 - 320 mg/dL Final  Passed - Valid encounter within last 6 months    Recent Outpatient Visits           2 months ago Type 2 diabetes mellitus with diabetic polyneuropathy, without long-term current use of insulin (HCC)   [place] [facility] [place], [place], MD   6 months ago Type 2 diabetes mellitus with diabetic polyneuropathy, without long-term current use of insulin (HCC)   [place] [facility] [place], [place], MD   9 months ago Type 2 diabetes mellitus with diabetic polyneuropathy, without long-term current use of insulin (HCC)   [place] [facility] [place], [place], MD   1 year ago Type 2 diabetes mellitus with diabetic polyneuropathy, without long-term current use of insulin (HCC)   [place] [facility] [place], [place], MD   1 year ago Type 2 diabetes mellitus with diabetic polyneuropathy, without  long-term current use of insulin (HCC)   [place] [facility] Hoy Register, MD       Future Appointments             In 1 week Hoy Register, MD [facility]               Requested Prescriptions  Pending Prescriptions Disp Refills   atorvastatin (LIPITOR) 10 MG tablet [Pharmacy Med Name: ATORVASTATIN 10MG  TABLETS] 90 tablet 0    Sig: TAKE 1 TABLET BY MOUTH EVERY DAY     Cardiovascular:  Antilipid - Statins Failed - 03/01/2023 12:16 PM      Failed - Lipid Panel in normal range within the last 12 months    Cholesterol, Total  Date Value Ref Range Status  12/08/2022 133 100 - 199 mg/dL Final   LDL Chol Calc (NIH)  Date Value Ref Range Status  12/08/2022 55 0 - 99 mg/dL Final   HDL  Date Value Ref Range Status  12/08/2022 65 >39 mg/dL Final   Triglycerides  Date Value Ref Range Status  12/08/2022 61 0 - 149 mg/dL Final         Passed - Patient is not pregnant      Passed - Valid encounter within last 12 months    Recent Outpatient Visits           2 months ago Type 2 diabetes mellitus with diabetic polyneuropathy, without long-term current use of insulin (HCC)   [place] [facility] [place], [place], MD   6 months ago Type 2 diabetes mellitus with diabetic polyneuropathy, without long-term current use of insulin (HCC)   [place] [facility] [place], [place], MD   9 months ago Type 2 diabetes mellitus with diabetic polyneuropathy, without long-term current use of insulin (HCC)   [place] [facility] [place], [place], MD   1 year ago Type 2 diabetes mellitus with diabetic polyneuropathy, without long-term current use of insulin (HCC)    [facility] [place], [place], MD   1 year ago Type 2 diabetes mellitus with diabetic polyneuropathy, without long-term current use of insulin (HCC)   [facility] Hoy Register, MD       Future Appointments             In 1 week Hoy Register, MD [facility]  Signed Prescriptions Disp Refills   glipiZIDE (GLUCOTROL XL) 10 MG 24 hr tablet 90 tablet 1    Sig: TAKE 1 TABLET(10 MG) BY MOUTH DAILY WITH BREAKFAST* DISCONTINUE IMMEDIATE RELEASE GLIPIZIDE*     Endocrinology:  Diabetes - Sulfonylureas Passed - 03/01/2023 12:16 PM      Passed - HBA1C is between 0 and 7.9 and within 180 days    HbA1c, POC (controlled diabetic range)  Date Value Ref Range Status  12/08/2022 6.5 0.0 - 7.0 % Final         Passed - Cr in normal range and within 360 days    Creat  Date Value Ref Range Status  11/01/2015 1.20 (H) 0.50 - 0.99 mg/dL Final   Creatinine, Ser  Date Value Ref Range Status  02/09/2023 0.90 0.44 - 1.00 mg/dL Final   Creatinine, Urine  Date Value Ref Range Status  11/01/2015 82 20 - 320 mg/dL Final         Passed - Valid encounter within last 6 months    Recent Outpatient Visits           2 months ago Type 2 diabetes mellitus with diabetic polyneuropathy, without long-term current use of insulin (HCC)   [place] [facility] [place], [place], MD   6 months ago Type 2 diabetes mellitus with diabetic polyneuropathy, without long-term current use of insulin (HCC)   [place] [facility] [place], [place], MD   9 months ago Type 2 diabetes mellitus with diabetic polyneuropathy, without long-term current use of insulin (HCC)   [place] [facility] [place], [place], MD   1 year ago Type 2 diabetes mellitus with diabetic polyneuropathy, without long-term current use of insulin (HCC)   [place] [facility] [place], [place], MD   1 year ago Type 2 diabetes mellitus with diabetic polyneuropathy, without long-term current use of insulin (HCC)   [place]  [facility] Hoy Register, MD       Future Appointments             In 1 week Hoy Register, MD [facility]

## 2023-03-02 NOTE — Telephone Encounter (Signed)
Patient Cardiologist Dr. Randie Heinz is going to need a clearance form faxed over for pt knee replacement please advise

## 2023-03-02 NOTE — Progress Notes (Signed)
HPI: Mrs. Tammy Graham comes in today wanting to discuss left total knee arthroplasty.  She has known severe tricompartmental arthritis of her left knee.  However unfortunately she had a stent placed by Dr. Pascal Lux less than a month ago in the right leg.  Discussed with her that she would need to gain approval from Dr.Cain prior to having surgery given that this recent femoral stent placement.  She is given a note to give to Dr. Randie Heinz stating that we need clearance to proceed with scheduling her.  When she has clearance she will return so we can further discuss knee replacement surgery with her.  No charge for today's office visit.

## 2023-03-02 NOTE — Telephone Encounter (Signed)
Patient was in the office today seeing Bronson Curb to discuss scheduling of total knee. She has had recent stent placement in opposite leg and Bronson Curb explained that we would have to get an okay from her cardiologist that she could have the surgery, especially being this close to stent placement. Could you fax clearance sheet over to them? Surgery sheet has not been completed yet.

## 2023-03-02 NOTE — Telephone Encounter (Signed)
Requested Prescriptions  Pending Prescriptions Disp Refills   atorvastatin (LIPITOR) 10 MG tablet [Pharmacy Med Name: ATORVASTATIN 10MG  TABLETS] 90 tablet 0    Sig: TAKE 1 TABLET BY MOUTH EVERY DAY     Cardiovascular:  Antilipid - Statins Failed - 03/01/2023 12:16 PM      Failed - Lipid Panel in normal range within the last 12 months    Cholesterol, Total  Date Value Ref Range Status  12/08/2022 133 100 - 199 mg/dL Final   LDL Chol Calc (NIH)  Date Value Ref Range Status  12/08/2022 55 0 - 99 mg/dL Final   HDL  Date Value Ref Range Status  12/08/2022 65 >39 mg/dL Final   Triglycerides  Date Value Ref Range Status  12/08/2022 61 0 - 149 mg/dL Final         Passed - Patient is not pregnant      Passed - Valid encounter within last 12 months    Recent Outpatient Visits           2 months ago Type 2 diabetes mellitus with diabetic polyneuropathy, without long-term current use of insulin (HCC)   [place] [place], [place], MD   6 months ago Type 2 diabetes mellitus with diabetic polyneuropathy, without long-term current use of insulin (HCC)   [place] [facility] [place], [place], MD   9 months ago Type 2 diabetes mellitus with diabetic polyneuropathy, without long-term current use of insulin (HCC)   [place] [facility] [place], [place], MD   1 year ago Type 2 diabetes mellitus with diabetic polyneuropathy, without long-term current use of insulin (HCC)   [place] [facility] [place], [place] Horns, MD   1 year ago Type 2 diabetes mellitus with diabetic polyneuropathy, without long-term current use of insulin (HCC)   [place] [facility] [place], [place] Horns, MD       Future Appointments             In 1 week Hoy Register, MD [place]             glipiZIDE (GLUCOTROL XL) 10 MG 24 hr tablet [Pharmacy Med  Name: GLIPIZIDE ER 10MG  TABLETS] 90 tablet 1    Sig: TAKE 1 TABLET(10 MG) BY MOUTH DAILY WITH BREAKFAST* DISCONTINUE IMMEDIATE RELEASE GLIPIZIDE*     Endocrinology:  Diabetes - Sulfonylureas Passed - 03/01/2023 12:16 PM      Passed - HBA1C is between 0 and 7.9 and within 180 days    HbA1c, POC (controlled diabetic range)  Date Value Ref Range Status  12/08/2022 6.5 0.0 - 7.0 % Final         Passed - Cr in normal range and within 360 days    Creat  Date Value Ref Range Status  11/01/2015 1.20 (H) 0.50 - 0.99 mg/dL Final   Creatinine, Ser  Date Value Ref Range Status  02/09/2023 0.90 0.44 - 1.00 mg/dL Final   Creatinine, Urine  Date Value Ref Range Status  11/01/2015 82 20 - 320 mg/dL Final         Passed - Valid encounter within last 6 months    Recent Outpatient Visits           2 months ago Type 2 diabetes mellitus with diabetic polyneuropathy, without long-term current use of insulin (HCC)   [facility] Hoy Register, MD  6 months ago Type 2 diabetes mellitus with diabetic polyneuropathy, without long-term current use of insulin (HCC)   [place] [facility] [place], [place], MD   9 months ago Type 2 diabetes mellitus with diabetic polyneuropathy, without long-term current use of insulin (HCC)   [place] [facility] Center [place], [place], MD   1 year ago Type 2 diabetes mellitus with diabetic polyneuropathy, without long-term current use of insulin (HCC)   [place] [facility] [place], Od[place] Horns, MD   1 year ago Type 2 diabetes mellitus with diabetic polyneuropathy, without long-term current use of insulin (HCC)   [facility] Hoy Register, MD       Future Appointments             In 1 week Hoy Register, MD [facility]

## 2023-03-05 ENCOUNTER — Telehealth: Payer: Self-pay

## 2023-03-05 NOTE — Progress Notes (Signed)
Patient attempted to be outreached by Frederic Jericho, PharmD Candidate on 03/05/23 to discuss hypertension.  Patient was unavailable to talk as she was driving; she was informed that we would check in with her on 5/17.  Frederic Jericho, Student-PharmD

## 2023-03-10 ENCOUNTER — Ambulatory Visit: Payer: BC Managed Care – PPO | Attending: Family Medicine | Admitting: Family Medicine

## 2023-03-10 ENCOUNTER — Encounter: Payer: Self-pay | Admitting: Family Medicine

## 2023-03-10 ENCOUNTER — Other Ambulatory Visit: Payer: Self-pay

## 2023-03-10 VITALS — BP 171/84 | HR 66 | Temp 98.2°F | Ht 66.0 in | Wt 155.6 lb

## 2023-03-10 DIAGNOSIS — M1712 Unilateral primary osteoarthritis, left knee: Secondary | ICD-10-CM | POA: Diagnosis not present

## 2023-03-10 DIAGNOSIS — Z01818 Encounter for other preprocedural examination: Secondary | ICD-10-CM | POA: Insufficient documentation

## 2023-03-10 DIAGNOSIS — Z955 Presence of coronary angioplasty implant and graft: Secondary | ICD-10-CM | POA: Insufficient documentation

## 2023-03-10 DIAGNOSIS — I739 Peripheral vascular disease, unspecified: Secondary | ICD-10-CM | POA: Diagnosis not present

## 2023-03-10 DIAGNOSIS — E1142 Type 2 diabetes mellitus with diabetic polyneuropathy: Secondary | ICD-10-CM | POA: Diagnosis not present

## 2023-03-10 DIAGNOSIS — Z7984 Long term (current) use of oral hypoglycemic drugs: Secondary | ICD-10-CM

## 2023-03-10 DIAGNOSIS — I10 Essential (primary) hypertension: Secondary | ICD-10-CM | POA: Insufficient documentation

## 2023-03-10 DIAGNOSIS — E1151 Type 2 diabetes mellitus with diabetic peripheral angiopathy without gangrene: Secondary | ICD-10-CM | POA: Diagnosis not present

## 2023-03-10 DIAGNOSIS — E1159 Type 2 diabetes mellitus with other circulatory complications: Secondary | ICD-10-CM

## 2023-03-10 DIAGNOSIS — I2510 Atherosclerotic heart disease of native coronary artery without angina pectoris: Secondary | ICD-10-CM | POA: Diagnosis not present

## 2023-03-10 DIAGNOSIS — I152 Hypertension secondary to endocrine disorders: Secondary | ICD-10-CM

## 2023-03-10 MED ORDER — ONETOUCH ULTRASOFT LANCETS MISC
12 refills | Status: AC
Start: 2023-03-10 — End: ?

## 2023-03-10 MED ORDER — MISC. DEVICES MISC
0 refills | Status: AC
Start: 2023-03-10 — End: ?

## 2023-03-10 MED ORDER — ONETOUCH VERIO VI STRP: ORAL_STRIP | 12 refills | Status: DC

## 2023-03-10 MED ORDER — VALSARTAN 320 MG PO TABS
320.0000 mg | ORAL_TABLET | Freq: Every day | ORAL | 1 refills | Status: DC
Start: 2023-03-10 — End: 2023-12-08

## 2023-03-10 MED ORDER — ONETOUCH VERIO W/DEVICE KIT
PACK | 0 refills | Status: AC
Start: 2023-03-10 — End: ?

## 2023-03-10 NOTE — Progress Notes (Unsigned)
Subjective:  Patient ID: Tammy Graham, female    DOB: [birth year]  Age: 72 y.o. MRN: [id number]  CC: Pre-op Exam   HPI Tammy Graham is a 72 y.o. year old female with a history of coronary artery disease (status post DES), type 2 diabetes mellitus (A1c 6.7), hypertension, PAD s/p right common femoral to above-knee popliteal bypass in 02/2021, status post right femoral artery stenting 02/2023, L knee OA, tobacco abuse who presents today for chronic disease management.   Interval History:  Since her last visit she has undergone right abdominal aortogram with lower extremity and right peripheral vascular intervention in 01/2023 with placement of right femoral stent. She also had a visit with orthopedics due to osteoarthritis of left knee and notes reviewed review clearance will be needed from her vascular surgeon due to recent femoral stent placed.  She has been taken out of her 2nd job at [employer] due to heavy lifting which caused her to have left groin pain.  She also complains she lifted something at work and this fell on her left lower leg and she sustained a bump.  Her BP is elevated and she endorses adherence with her medications but states she would like to discontinue Losartan due to side effects.  She also states she will be discussing Plavix with her vascular surgeon as she also has adverse effects from it. In the past she tried Amlodipine and requested to discontinue it due to side effects. She complains her antihypertensives are not working.  She took her antihypertensive today and her blood pressure is elevated. Past Medical History:  Diagnosis Date   Anxiety    Arthritis    shoulder and knees   CAD (coronary artery disease)    Complication of anesthesia    states she was still awake one time when surgery started   DM (diabetes mellitus) (HCC)    HTN (hypertension)    Hyperlipemia    Myocardial infarction [facility])    Peripheral vascular disease (HCC)    Pneumonia    Sleep  apnea    no cpap   Stroke [facility])    2012    Past Surgical History:  Procedure Laterality Date   ABDOMINAL AORTOGRAM W/LOWER EXTREMITY Bilateral 02/11/2021   Procedure: ABDOMINAL AORTOGRAM W/LOWER EXTREMITY;  Surgeon: Maeola Harman, MD;  Location: [facility] INVASIVE CV LAB;  Service: Cardiovascular;  Laterality: Bilateral;   ABDOMINAL AORTOGRAM W/LOWER EXTREMITY Right 02/09/2023   Procedure: ABDOMINAL AORTOGRAM W/LOWER EXTREMITY;  Surgeon: Maeola Harman, MD;  Location: [facility] INVASIVE CV LAB;  Service: Cardiovascular;  Laterality: Right;   CARDIAC CATHETERIZATION     COLONOSCOPY     COLONOSCOPY  09/25/2011   Procedure: COLONOSCOPY;  Surgeon: Vertell Novak., MD;  Location: [facility] ENDOSCOPY;  Service: Endoscopy;  Laterality: N/A;   CORONARY STENT PLACEMENT     Ostial LAD stent in 2012   FEMORAL-POPLITEAL BYPASS GRAFT Right 02/27/2021   Procedure: RIGHT COMMON FEMORAL- ABOVE KNEE POPLITEAL ARTERY BYPASS GRAFT;  Surgeon: Maeola Harman, MD;  Location: [facility] OR;  Service: Vascular;  Laterality: Right;   Left knee surgery     PERIPHERAL VASCULAR INTERVENTION Right 02/09/2023   Procedure: PERIPHERAL VASCULAR INTERVENTION;  Surgeon: Maeola Harman, MD;  Location: [facility] INVASIVE CV LAB;  Service: Cardiovascular;  Laterality: Right;  RT SFA    Family History  Problem Relation Age of Onset   Heart attack Mother    Anesthesia problems Neg Hx    Hypotension Neg Hx  Malignant hyperthermia Neg Hx    Pseudochol deficiency Neg Hx     Social History   Socioeconomic History   Marital status: Widowed    Spouse name: Not on file   Number of children: Not on file   Years of education: Not on file   Highest education level: Not on file  Occupational History   Not on file  Tobacco Use   Smoking status: Every Day    Packs/day: 0.25    Years: 15.00    Additional pack years: 0.00    Total pack years: 3.75    Types: Cigarettes    Last attempt to quit: 02/27/2021     Years since quitting: 2.0   Smokeless tobacco: Never   Tobacco comments:    4-5 daily  Vaping Use   Vaping Use: Never used  Substance and Sexual Activity   Alcohol use: Not Currently    Alcohol/week: 1.0 standard drink of alcohol    Types: 1 Standard drinks or equivalent per week    Comment: occasionally    Drug use: No   Sexual activity: Not on file  Other Topics Concern   Not on file  Social History Narrative   Not on file   Social Determinants of Health   Financial Resource Strain: Not on file  Food Insecurity: Not on file  Transportation Needs: Not on file  Physical Activity: Not on file  Stress: Not on file  Social Connections: Not on file    Allergies  Allergen Reactions   Benadryl [Diphenhydramine Hcl (Sleep)] Other (See Comments)    unknown   Plavix [Clopidogrel Bisulfate] Itching   Rosuvastatin Hives    Patient is allergic to generic rosuvastatin. She says she is able to take branded Crestor.    Simvastatin Other (See Comments)    unknown    Outpatient Medications Prior to Visit  Medication Sig Dispense Refill   acetaminophen-codeine (TYLENOL #3) 300-30 MG tablet TAKE 1 TABLET BY MOUTH EVERY 12 HOURS AS NEEDED FOR MODERATE PAIN 60 tablet 2   aspirin EC 81 MG tablet Take 81 mg by mouth in the morning. Swallow whole.     atorvastatin (LIPITOR) 10 MG tablet TAKE 1 TABLET BY MOUTH EVERY DAY 90 tablet 0   atorvastatin (LIPITOR) 20 MG tablet Take 1 tablet (20 mg total) by mouth daily. 90 tablet 1   carboxymethylcellul-glycerin (LUBRICANT DROPS/DUAL-ACTION) 0.5-0.9 % ophthalmic solution Place 1-2 drops into both eyes 3 (three) times daily as needed for dry eyes.     carvedilol (COREG) 12.5 MG tablet TAKE 1 TABLET(12.5 MG) BY MOUTH TWICE DAILY WITH A MEAL 180 tablet 0   cetirizine (ZYRTEC ALLERGY) 10 MG tablet Take 1 tablet (10 mg total) by mouth daily. 14 tablet 0   FARXIGA 10 MG TABS tablet TAKE 1 TABLET BY MOUTH ONCE DAILY BEFORE BREAKFAST 90 tablet 0    fluconazole (DIFLUCAN) 150 MG tablet Take 1 tablet (150 mg total) by mouth daily. 5 tablet 0   gabapentin (NEURONTIN) 100 MG capsule Take 1 capsule (100 mg total) by mouth 3 (three) times daily. 90 capsule 3   glipiZIDE (GLUCOTROL XL) 10 MG 24 hr tablet TAKE 1 TABLET(10 MG) BY MOUTH DAILY WITH BREAKFAST* DISCONTINUE IMMEDIATE RELEASE GLIPIZIDE* 90 tablet 1   hydrocortisone cream 1 % Apply to affected area 2 times daily 15 g 0   metroNIDAZOLE (FLAGYL) 500 MG tablet Take 1 tablet (500 mg total) by mouth 2 (two) times daily. 14 tablet 0   Misc. Devices  MISC Shower bench, Above the toilet seat.  Diagnosis- Type 2 Diabetes Mellitus 1 each 0   Multiple Vitamin (MULTIVITAMIN WITH MINERALS) TABS tablet Take 1 tablet by mouth in the morning.     nicotine (NICODERM CQ - DOSED IN MG/24 HOURS) 14 mg/24hr patch Place 1 patch (14 mg total) onto the skin daily. 28 patch 0   nitroGLYCERIN (NITROSTAT) 0.4 MG SL tablet Place 0.4 mg under the tongue every 5 (five) minutes as needed for chest pain.     PRESCRIPTION MEDICATION Apply 1 application topically in the morning and at bedtime. Kamea Foot Cream     Accu-Chek FastClix Lancets MISC USE AS INSTRUCTED 102 each 11   Accu-Chek FastClix Lancets MISC Use daily as directed 102 each 3   Blood Glucose Monitoring Suppl (ACCU-CHEK GUIDE ME) w/Device KIT 1 kit by Does not apply route daily. Use to check blood sugar daily. 1 kit 0   clopidogrel (PLAVIX) 75 MG tablet Take 1 tablet (75 mg total) by mouth daily. 30 tablet 11   glucose blood (ONETOUCH ULTRA) test strip Use to check blood sugar twice daily. E11.9 100 strip 6   losartan (COZAAR) 100 MG tablet Take 1 tablet (100 mg total) by mouth daily. 90 tablet 1   No facility-administered medications prior to visit.     ROS Review of Systems  Constitutional:  Negative for activity change and appetite change.  HENT:  Negative for sinus pressure and sore throat.   Respiratory:  Negative for chest tightness, shortness of  breath and wheezing.   Cardiovascular:  Negative for chest pain and palpitations.  Gastrointestinal:  Negative for abdominal distention, abdominal pain and constipation.  Genitourinary: Negative.   Musculoskeletal: Negative.   Psychiatric/Behavioral:  Negative for behavioral problems and dysphoric mood.     Objective:  BP (!) 171/84   Pulse 66   Temp 98.2 F (36.8 C) (Oral)   Ht 5\' 6"  (1.676 m)   Wt 155 lb 9.6 oz (70.6 kg)   SpO2 97%   BMI 25.11 kg/m      03/11/2023    1:14 PM 03/10/2023    2:09 PM 03/02/2023    9:57 AM  BP/Weight  Systolic BP 162 171   Diastolic BP 89 84   Wt. (Lbs) 154 155.6 140  BMI 24.86 kg/m2 25.11 kg/m2 22.6 kg/m2      Physical Exam Constitutional:      Appearance: She is well-developed.  Cardiovascular:     Rate and Rhythm: Normal rate.     Heart sounds: Normal heart sounds. No murmur heard. Pulmonary:     Effort: Pulmonary effort is normal.     Breath sounds: Normal breath sounds. No wheezing or rales.  Chest:     Chest wall: No tenderness.  Abdominal:     General: Bowel sounds are normal. There is no distension.     Palpations: Abdomen is soft. There is no mass.     Tenderness: There is no abdominal tenderness.  Musculoskeletal:     Right lower leg: No edema.     Left lower leg: No edema.     Comments: Tenderness and crepitus on range of motion of left knee  Skin:    Comments: Tiny painful lump on lower left leg  Neurological:     Mental Status: She is alert and oriented to person, place, and time.  Psychiatric:        Mood and Affect: Mood normal.        Latest Ref  Rng & Units 02/09/2023    9:24 AM 12/08/2022   12:18 PM 08/05/2022   11:57 AM  CMP  Glucose 70 - 99 mg/dL 161  89  64   BUN 8 - 23 mg/dL 18  12  13    Creatinine 0.44 - 1.00 mg/dL 0.96  0.45  4.09   Sodium 135 - 145 mmol/L 144  145  144   Potassium 3.5 - 5.1 mmol/L 4.0  4.4  4.1   Chloride 98 - 111 mmol/L 108  106  103   CO2 20 - 29 mmol/L  24  27   Calcium 8.7  - 10.3 mg/dL  81.1  91.4   Total Protein 6.0 - 8.5 g/dL  6.5    Total Bilirubin 0.0 - 1.2 mg/dL  0.8    Alkaline Phos 44 - 121 IU/L  122    AST 0 - 40 IU/L  18    ALT 0 - 32 IU/L  17      Lipid Panel     Component Value Date/Time   CHOL 133 12/08/2022 1218   TRIG 61 12/08/2022 1218   HDL 65 12/08/2022 1218   CHOLHDL 2.2 02/28/2021 0155   VLDL 12 02/28/2021 0155   LDLCALC 55 12/08/2022 1218    CBC    Component Value Date/Time   WBC 13.5 (H) 02/28/2021 0155   RBC 3.61 (L) 02/28/2021 0155   HGB 15.6 (H) 02/09/2023 0924   HGB 15.3 07/01/2019 1550   HCT 46.0 02/09/2023 0924   HCT 46.5 07/01/2019 1550   PLT 193 02/28/2021 0155   PLT 257 07/01/2019 1550   MCV 95.3 02/28/2021 0155   MCV 98 (H) 07/01/2019 1550   MCH 32.7 02/28/2021 0155   MCHC 34.3 02/28/2021 0155   RDW 12.7 02/28/2021 0155   RDW 12.6 07/01/2019 1550   LYMPHSABS 2.9 09/11/2016 2316   MONOABS 0.4 09/11/2016 2316   EOSABS 0.2 09/11/2016 2316   BASOSABS 0.0 09/11/2016 2316    Lab Results  Component Value Date   HGBA1C 6.5 12/08/2022    Assessment & Plan:  1. Type 2 diabetes mellitus with diabetic polyneuropathy, without long-term current use of insulin (HCC) Controlled with A1c of 6.5 Continue current regimen Counseled on Diabetic diet, my plate method, 782 minutes of moderate intensity exercise/week Blood sugar logs with fasting goals of 80-120 mg/dl, random of less than 956 and in the event of sugars less than 60 mg/dl or greater than 213 mg/dl encouraged to notify the clinic. Advised on the need for annual eye exams, annual foot exams, Pneumonia vaccine. - Lancets (ONETOUCH ULTRASOFT) lancets; Use to test blood sugar 3 times daily  Dispense: 100 each; Refill: 12 - Blood Glucose Monitoring Suppl (ONETOUCH VERIO) w/Device KIT; Use to test blood sugar 3 times daily  Dispense: 1 kit; Refill: 0 - glucose blood (ONETOUCH VERIO) test strip; Use to test blood sugar 3 times daily  Dispense: 100 each; Refill:  12  2. Hypertension associated with diabetes (HCC) Uncontrolled She complains side effects with losartan hence I have discontinued it Placed on Diovan Will recheck blood pressure in 1 week - valsartan (DIOVAN) 320 MG tablet; Take 1 tablet (320 mg total) by mouth daily.  Dispense: 90 tablet; Refill: 1 - Misc. Devices MISC; Blood pressure monitor.  Diagnosis hypertension  Dispense: 1 each; Refill: 0  3. Peripheral arterial disease (HCC) Status post multiple interventions and most recently right femoral artery stent She is currently on Plavix She has an appointment with  vascular surgery tomorrow  4. Preoperative examination Due to uncontrolled blood pressure she is not optimized for her procedure We have performed an medication regimen change I will see her back in 1 week to reassess blood pressure EKG revealed no findings suspicious for ischemia  5. Osteoarthritis of left knee, unspecified osteoarthritis type Currently being worked up for elective surgery Continue to follow-up with orthopedics    Meds ordered this encounter  Medications   valsartan (DIOVAN) 320 MG tablet    Sig: Take 1 tablet (320 mg total) by mouth daily.    Dispense:  90 tablet    Refill:  1    Discontinue losartan   Misc. Devices MISC    Sig: Blood pressure monitor.  Diagnosis hypertension    Dispense:  1 each    Refill:  0   Lancets (ONETOUCH ULTRASOFT) lancets    Sig: Use to test blood sugar 3 times daily    Dispense:  100 each    Refill:  12   Blood Glucose Monitoring Suppl (ONETOUCH VERIO) w/Device KIT    Sig: Use to test blood sugar 3 times daily    Dispense:  1 kit    Refill:  0   glucose blood (ONETOUCH VERIO) test strip    Sig: Use to test blood sugar 3 times daily    Dispense:  100 each    Refill:  12    Follow-up: Return in about 1 week (around 03/17/2023) for Blood Pressure follow-up, ok to double book.       Hoy Register, MD, FAAFP. [facility]  [place], [place] [phone]   03/11/2023, 1:51 PM

## 2023-03-10 NOTE — Patient Instructions (Signed)
Peripheral Vascular Disease  Peripheral vascular disease (PVD) is a disease of the blood vessels. PVD may also be called peripheral artery disease (PAD) or poor circulation. PVD is the blocking or hardening of the arteries anywhere within the circulatory system beyond the heart. This can result in a decreased supply of blood to the arms, legs, and internal organs, such as the stomach or kidneys. However, PVD most often affects a person's lower legs and feet. Without treatment, PVD often worsens. PVD can lead to acute limb ischemia. This occurs when an arm or leg suddenly has trouble getting enough blood. This is a medical emergency. What are the causes? The most common cause of PVD is atherosclerosis. This is a buildup of fatty material and other substances (plaque)inside your arteries. Pieces of plaque can break off from the walls of an artery and become stuck in a smaller artery, blocking blood flow and possibly causing acute limb ischemia. Other common causes of PVD include: Blood clots that form inside the blood vessels. Injuries to blood vessels. Diseases that cause inflammation of blood vessels or cause blood vessel tightening (spasms). What increases the risk? The following factors may make you more likely to develop this condition: A family history of PVD. Common medical conditions, including: High cholesterol. Diabetes. High blood pressure (hypertension). Heart disease. Known atherosclerotic disease in another area of the body. Past injury, such as burns or a broken bone. Other medical conditions, such as: Buerger's disease. This is caused by inflamed blood vessels in your hands and feet. Some forms of arthritis. Birth defects that affect the arteries in your legs. Kidney disease. Using tobacco and nicotine products. Not getting enough exercise. Obesity. Being age 65 or older, or being age 50 or older and having the other risk factors. What are the signs or symptoms? This  condition may cause different symptoms. Your symptoms depend on what body part is not getting enough blood. Common signs and symptoms include: Cramps in your buttocks, legs, and feet. Intermittent claudication. This is pain and weakness in your legs during activity that resolves with rest. Leg pain at rest and leg numbness, tingling, or weakness. Coldness in a leg or foot, especially when compared to the other leg or foot. Skin or hair changes. These can include: Hair loss. Shiny skin. Pale or bluish skin. Thick toenails. Inability to get or maintain an erection (erectile dysfunction). Tiredness (fatigue). Weak pulse or no pulse in the feet. People with PVD are more likely to develop open wounds (ulcers) and sores on their toes, feet, or legs. The ulcers or sores may take longer than normal to heal. How is this diagnosed? PVD is diagnosed based on your signs and symptoms, a physical exam, and your medical history. You may also have other tests to find the cause. Tests include: Ankle-brachial index test.This test compares the blood pressure readings of the legs and arms. This may also include an exercise ankle-brachial index test in which you walk on a treadmill to check your symptoms. Doppler ultrasound. This takes pictures of blood flow through your blood vessels. Imaging studies that use dye to show blood flow. These are: CT angiogram. Magnetic resonance angiogram, or MRA. How is this treated? Treatment for PVD depends on the cause of your condition, how severe your symptoms are, and your age. Underlying causes need to be treated and controlled. These include long-term (chronic) conditions, such as diabetes, high cholesterol, and hypertension. Treatment may include: Lifestyle changes, such as: Quitting tobacco use. Exercising regularly. Following a   low-fat, low-cholesterol diet. Not drinking alcohol. Taking medicines, such as: Blood thinners to prevent blood clots. Medicines to  improve blood flow. Medicines to improve cholesterol levels. Procedures, such as: Angioplasty. This uses an inflated balloon to open a blocked artery and improve blood flow. Stent implant. This inserts a small mesh tube to keep a blocked artery open. Peripheral bypass surgery. This reroutes blood flow around a blocked artery. Surgery to remove dead tissue from an infected wound (debridement). Amputation. This is surgical removal of the affected limb. It may be necessary in cases of acute limb ischemia when medical or surgical treatments have not helped. Follow these instructions at home: Medicines Take over-the-counter and prescription medicines only as told by your health care provider. If you are taking blood thinners: Talk with your health care provider before you take any medicines that contain aspirin or NSAIDs, such as ibuprofen. These medicines increase your risk for dangerous bleeding. Take your medicine exactly as told, at the same time every day. Avoid activities that could cause injury or bruising, and follow instructions about how to prevent falls. Wear a medical alert bracelet or carry a card that lists what medicines you take. Lifestyle     Exercise regularly. Ask your health care provider about some good activities for you. Talk with your health care provider about maintaining a healthy weight. If needed, ask about losing weight. Eat a diet that is low in fat and cholesterol. If you need help, talk with your health care provider. Do not drink alcohol. Do not use any products that contain nicotine or tobacco. These products include cigarettes, chewing tobacco, and vaping devices, such as e-cigarettes. If you need help quitting, ask your health care provider. General instructions Take good care of your feet. To do this: Wear comfortable shoes that fit well. Check your feet often for any cuts or sores. Get an annual influenza vaccine. Keep all follow-up visits. This is  important. Where to find more information Society for Vascular Surgery: vascular.org American Heart Association: heart.org National Heart, Lung, and Blood Institute: nhlbi.nih.gov Contact a health care provider if: You have leg cramps while walking. You have leg pain when you rest. Your leg or foot feels cold. Your skin changes color. You have erectile dysfunction. You have cuts or sores on your legs or feet that do not heal. Get help right away if: You have sudden changes in color and feeling of your arms or legs, such as: Your arm or leg turns cold, numb, and blue. Your arm or leg becomes red, warm, swollen, painful, or numb. You have any symptoms of a stroke. "BE FAST" is an easy way to remember the main warning signs of a stroke: B - Balance. Signs are dizziness, sudden trouble walking, or loss of balance. E - Eyes. Signs are trouble seeing or a sudden change in vision. F - Face. Signs are sudden weakness or numbness of the face, or the face or eyelid drooping on one side. A - Arms. Signs are weakness or numbness in an arm. This happens suddenly and usually on one side of the body. S - Speech. Signs are sudden trouble speaking, slurred speech, or trouble understanding what people say. T - Time. Time to call emergency services. Write down what time symptoms started. You have other signs of a stroke, such as: A sudden, severe headache with no known cause. Nausea or vomiting. Seizure. You have chest pain or trouble breathing. These symptoms may represent a serious problem that is an emergency.   Do not wait to see if the symptoms will go away. Get medical help right away. Call your local emergency services (911 in the [place] Do not drive yourself to the hospital. Summary Peripheral vascular disease (PVD) is a disease of the blood vessels. PVD is the blocking or hardening of the arteries anywhere within the circulatory system beyond the heart. PVD may cause different symptoms. Your  symptoms depend on what part of your body is not getting enough blood. Treatment for PVD depends on what caused it, how severe your symptoms are, and your age. This information is not intended to replace advice given to you by your health care provider. Make sure you discuss any questions you have with your health care provider. Document Revised: 04/09/2020 Document Reviewed: 04/09/2020 Elsevier Patient Education  2023 [employer]  

## 2023-03-11 ENCOUNTER — Encounter: Payer: Self-pay | Admitting: Physician Assistant

## 2023-03-11 ENCOUNTER — Ambulatory Visit (INDEPENDENT_AMBULATORY_CARE_PROVIDER_SITE_OTHER)
Admission: RE | Admit: 2023-03-11 | Discharge: 2023-03-11 | Disposition: A | Discharge status: Home / Self Care | Payer: BC Managed Care – PPO | Source: Ambulatory Visit | Attending: Vascular Surgery | Admitting: Vascular Surgery

## 2023-03-11 ENCOUNTER — Ambulatory Visit (INDEPENDENT_AMBULATORY_CARE_PROVIDER_SITE_OTHER): Payer: BC Managed Care – PPO | Admitting: Physician Assistant

## 2023-03-11 ENCOUNTER — Ambulatory Visit (HOSPITAL_COMMUNITY)
Admission: RE | Admit: 2023-03-11 | Discharge: 2023-03-11 | Disposition: A | Discharge status: Home / Self Care | Payer: BC Managed Care – PPO | Source: Ambulatory Visit | Attending: Vascular Surgery | Admitting: Vascular Surgery

## 2023-03-11 VITALS — BP 162/89 | HR 62 | Temp 97.8°F | Wt 154.0 lb

## 2023-03-11 DIAGNOSIS — I70211 Atherosclerosis of native arteries of extremities with intermittent claudication, right leg: Secondary | ICD-10-CM

## 2023-03-11 DIAGNOSIS — I739 Peripheral vascular disease, unspecified: Secondary | ICD-10-CM

## 2023-03-11 LAB — VAS US ABI WITH/WO TBI
Left ABI: 0.97
Right ABI: 1.15

## 2023-03-11 NOTE — Progress Notes (Signed)
Office Note   History of Present Illness   Tammy Graham is a 73 y.o. (01-09-1951) female who presents for surveillance of PAD.  She has a history of right SFA to above-knee popliteal artery bypass with greater saphenous vein on 02/27/2021 by Dr. Randie Heinz.  This was done for lifestyle limiting claudication of the right lower extremity.  She recently required stenting of the midportion of her bypass graft on 02/09/2023 by Dr. Randie Heinz.  This was done for high-grade stenosis of the bypass.  She returns today for follow-up. She denies any rest pain, claudication, or tissue loss of the lower extremities.  She occasionally has some tingling in the right lower extremity which is improving since her procedure.  Currently she is frustrated with her left knee issues.  She has severe arthritis of her left knee and this is keeping her from being able to work.  She is also requiring clearance from our office for left knee replacement surgery.  She is taking aspirin and atorvastatin daily.  She stopped taking her Plavix 2 weeks ago because it made her feel itchy.   Current Outpatient Medications  Medication Sig Dispense Refill   acetaminophen-codeine (TYLENOL #3) 300-30 MG tablet TAKE 1 TABLET BY MOUTH EVERY 12 HOURS AS NEEDED FOR MODERATE PAIN 60 tablet 2   aspirin EC 81 MG tablet Take 81 mg by mouth in the morning. Swallow whole.     atorvastatin (LIPITOR) 10 MG tablet TAKE 1 TABLET BY MOUTH EVERY DAY 90 tablet 0   atorvastatin (LIPITOR) 20 MG tablet Take 1 tablet (20 mg total) by mouth daily. 90 tablet 1   Blood Glucose Monitoring Suppl (ONETOUCH VERIO) w/Device KIT Use to test blood sugar 3 times daily 1 kit 0   carboxymethylcellul-glycerin (LUBRICANT DROPS/DUAL-ACTION) 0.5-0.9 % ophthalmic solution Place 1-2 drops into both eyes 3 (three) times daily as needed for dry eyes.     carvedilol (COREG) 12.5 MG tablet TAKE 1 TABLET(12.5 MG) BY MOUTH TWICE DAILY WITH A MEAL 180 tablet 0   cetirizine (ZYRTEC  ALLERGY) 10 MG tablet Take 1 tablet (10 mg total) by mouth daily. 14 tablet 0   FARXIGA 10 MG TABS tablet TAKE 1 TABLET BY MOUTH ONCE DAILY BEFORE BREAKFAST 90 tablet 0   fluconazole (DIFLUCAN) 150 MG tablet Take 1 tablet (150 mg total) by mouth daily. 5 tablet 0   gabapentin (NEURONTIN) 100 MG capsule Take 1 capsule (100 mg total) by mouth 3 (three) times daily. 90 capsule 3   glipiZIDE (GLUCOTROL XL) 10 MG 24 hr tablet TAKE 1 TABLET(10 MG) BY MOUTH DAILY WITH BREAKFAST* DISCONTINUE IMMEDIATE RELEASE GLIPIZIDE* 90 tablet 1   glucose blood (ONETOUCH VERIO) test strip Use to test blood sugar 3 times daily 100 each 12   hydrocortisone cream 1 % Apply to affected area 2 times daily 15 g 0   Lancets (ONETOUCH ULTRASOFT) lancets Use to test blood sugar 3 times daily 100 each 12   metroNIDAZOLE (FLAGYL) 500 MG tablet Take 1 tablet (500 mg total) by mouth 2 (two) times daily. 14 tablet 0   Misc. Devices MISC Shower bench, Above the toilet seat.  Diagnosis- Type 2 Diabetes Mellitus 1 each 0   Misc. Devices MISC Blood pressure monitor.  Diagnosis hypertension 1 each 0   Multiple Vitamin (MULTIVITAMIN WITH MINERALS) TABS tablet Take 1 tablet by mouth in the morning.     nicotine (NICODERM CQ - DOSED IN MG/24 HOURS) 14 mg/24hr patch Place 1 patch (14 mg  total) onto the skin daily. 28 patch 0   nitroGLYCERIN (NITROSTAT) 0.4 MG SL tablet Place 0.4 mg under the tongue every 5 (five) minutes as needed for chest pain.     PRESCRIPTION MEDICATION Apply 1 application topically in the morning and at bedtime. Kamea Foot Cream     valsartan (DIOVAN) 320 MG tablet Take 1 tablet (320 mg total) by mouth daily. 90 tablet 1   No current facility-administered medications for this visit.    REVIEW OF SYSTEMS (negative unless checked):   Cardiac:  []  Chest pain or chest pressure? []  Shortness of breath upon activity? []  Shortness of breath when lying flat? []  Irregular heart rhythm?  Vascular:  []  Pain in calf,  thigh, or hip brought on by walking? []  Pain in feet at night that wakes you up from your sleep? []  Blood clot in your veins? []  Leg swelling?  Pulmonary:  []  Oxygen at home? []  Productive cough? []  Wheezing?  Neurologic:  []  Sudden weakness in arms or legs? []  Sudden numbness in arms or legs? []  Sudden onset of difficult speaking or slurred speech? []  Temporary loss of vision in one eye? []  Problems with dizziness?  Gastrointestinal:  []  Blood in stool? []  Vomited blood?  Genitourinary:  []  Burning when urinating? []  Blood in urine?  Psychiatric:  []  Major depression  Hematologic:  []  Bleeding problems? []  Problems with blood clotting?  Dermatologic:  []  Rashes or ulcers?  Constitutional:  []  Fever or chills?  Ear/Nose/Throat:  []  Change in hearing? []  Nose bleeds? []  Sore throat?  Musculoskeletal:  []  Back pain? [x]  Joint pain? []  Muscle pain?   Physical Examination   Vitals:   03/11/23 1314  BP: (!) 162/89  Pulse: 62  Temp: 97.8 F (36.6 C)  TempSrc: Temporal  SpO2: 94%  Weight: 154 lb (69.9 kg)   Body mass index is 24.86 kg/m.  General:  WDWN in NAD; vital signs documented above Gait: Not observed HENT: WNL, normocephalic Pulmonary: normal non-labored breathing , without rales, rhonchi,  wheezing Cardiac: regular HR, without murmurs  Abdomen: soft, NT, no masses Skin: without rashes Vascular Exam/Pulses: Palpable DP/PT pulses bilaterally Extremities: without ischemic changes, without gangrene , without cellulitis; without open wounds;  Musculoskeletal: no muscle wasting or atrophy  Neurologic: A&O X 3;  No focal weakness or paresthesias are detected Psychiatric:  The pt has Normal affect.  Non-Invasive Vascular Imaging ABI (03/11/2023) R:  ABI: 1.15 (0.93),  PT: tri DP: tri TBI:  0.95 L:  ABI: 0.97 (1.01),  PT: tri DP: tri TBI: 0.75   RLE Bypass Duplex (03/11/2023) Patent right femoral to above-knee popliteal artery bypass  without stenosis. Patent stent in proximal/mid bypass.   Medical Decision Making   Tammy Graham is a 72 y.o. female who presents for surveillance of PAD  Based on the patient's vascular studies, her ABIs have increased on the right since her last visit.  Her right ABI is 1.15 with triphasic tibial vessel flow.  Her left ABI is essentially unchanged and is 0.97. She recently underwent stenting of the midportion of her right SFA to above-knee popliteal artery bypass on 02/09/2023.  Duplex of her bypass graft demonstrates a patent bypass without hemodynamically significant stenosis.  The stent in her bypass is patent without stenosis. She denies any rest pain, tissue loss, claudication of her lower extremities.  She has easily palpable DP/PT pulses bilaterally Unfortunately she has severe arthritis in her left knee which is keeping her from being able  to work.  She is requiring clearance from our office so she can undergo knee replacement surgery soon.  She is no longer taking her Plavix due to itchiness.  She does not need to resume her Plavix.  She should continue taking her aspirin and statin.  She is cleared from a vascular standpoint for knee replacement surgery. She can follow-up with our office in 6 months with ABIs and right lower extremity bypass graft duplex study   Loel Dubonnet PA-C [facility] Office: [phone]  Clinic MD: Randie Heinz

## 2023-03-12 NOTE — Telephone Encounter (Signed)
Faxed clearance request to Dr. Randie Heinz - Vascular surgeon.

## 2023-03-13 ENCOUNTER — Telehealth: Payer: Self-pay

## 2023-03-13 DIAGNOSIS — M1712 Unilateral primary osteoarthritis, left knee: Secondary | ICD-10-CM

## 2023-03-13 NOTE — Telephone Encounter (Signed)
Can you please ask Dr Laural Benes to please do it for me since I am out? Thanks

## 2023-03-13 NOTE — Telephone Encounter (Signed)
Can you

## 2023-03-13 NOTE — Telephone Encounter (Signed)
Can you change her tylenol #3 scrip to [facility] in [place]

## 2023-03-17 ENCOUNTER — Ambulatory Visit: Payer: BC Managed Care – PPO | Admitting: Orthopaedic Surgery

## 2023-03-17 MED ORDER — ACETAMINOPHEN-CODEINE 300-30 MG PO TABS
1.0000 | ORAL_TABLET | Freq: Two times a day (BID) | ORAL | 2 refills | Status: DC | PRN
Start: 2023-03-17 — End: 2023-06-09

## 2023-03-17 NOTE — Addendum Note (Signed)
Addended by: Hoy Register on: 03/17/2023 05:40 PM   Modules accepted: Orders

## 2023-03-17 NOTE — Telephone Encounter (Signed)
Done

## 2023-03-17 NOTE — Telephone Encounter (Signed)
Message was not read on Friday, can medication be sent to [facility] [place]

## 2023-03-18 NOTE — Telephone Encounter (Signed)
Pt called and informed of medication being sent to pharmacy. 

## 2023-03-19 ENCOUNTER — Other Ambulatory Visit: Payer: Self-pay

## 2023-03-19 DIAGNOSIS — I739 Peripheral vascular disease, unspecified: Secondary | ICD-10-CM

## 2023-03-19 DIAGNOSIS — I70211 Atherosclerosis of native arteries of extremities with intermittent claudication, right leg: Secondary | ICD-10-CM

## 2023-03-24 ENCOUNTER — Telehealth: Payer: Self-pay

## 2023-03-24 NOTE — Telephone Encounter (Signed)
Copied from CRM [id number]. Topic: General - Other >> Mar 24, 2023 12:20 PM Franchot Heidelberg wrote: Reason for CRM: Alinda Money from [employer] called to see if fax was received 01/21/2023, regarding a prescription change (from [place] to [place] which is less expensive)   Best contact: [phone]  [facility] will be on the top right corner of the page

## 2023-03-25 ENCOUNTER — Other Ambulatory Visit: Payer: Self-pay | Admitting: Pharmacist

## 2023-03-25 DIAGNOSIS — E1142 Type 2 diabetes mellitus with diabetic polyneuropathy: Secondary | ICD-10-CM

## 2023-03-25 MED ORDER — FARXIGA 10 MG PO TABS
10.0000 mg | ORAL_TABLET | Freq: Every day | ORAL | 1 refills | Status: DC
Start: 2023-03-25 — End: 2023-11-24

## 2023-03-25 NOTE — Progress Notes (Signed)
Fax received from [place] on 03/12/23 for medical clearance/medication hold for orthopedic procedure to be signed by B. Randie Heinz, MD.  Provider signed on 03/18/23, form faxed back to sender on 03/19/23, verified successful, sent to scan center.

## 2023-03-25 NOTE — Telephone Encounter (Signed)
She has a cardiac history and would benefit from remaining on Farxiga. Thanks

## 2023-03-25 NOTE — Telephone Encounter (Signed)
Can mediation be changed?

## 2023-03-31 IMAGING — DX DG CHEST 1V
1 series · 1 of 1 positions shown · non-contrast
Comparison: 04/06/2011.

CLINICAL DATA: Postop day 3 RIGHT femoropopliteal bypass graft.
Oxygen desaturation at rest.

EXAM:
CHEST  1 VIEW

[chest ap]
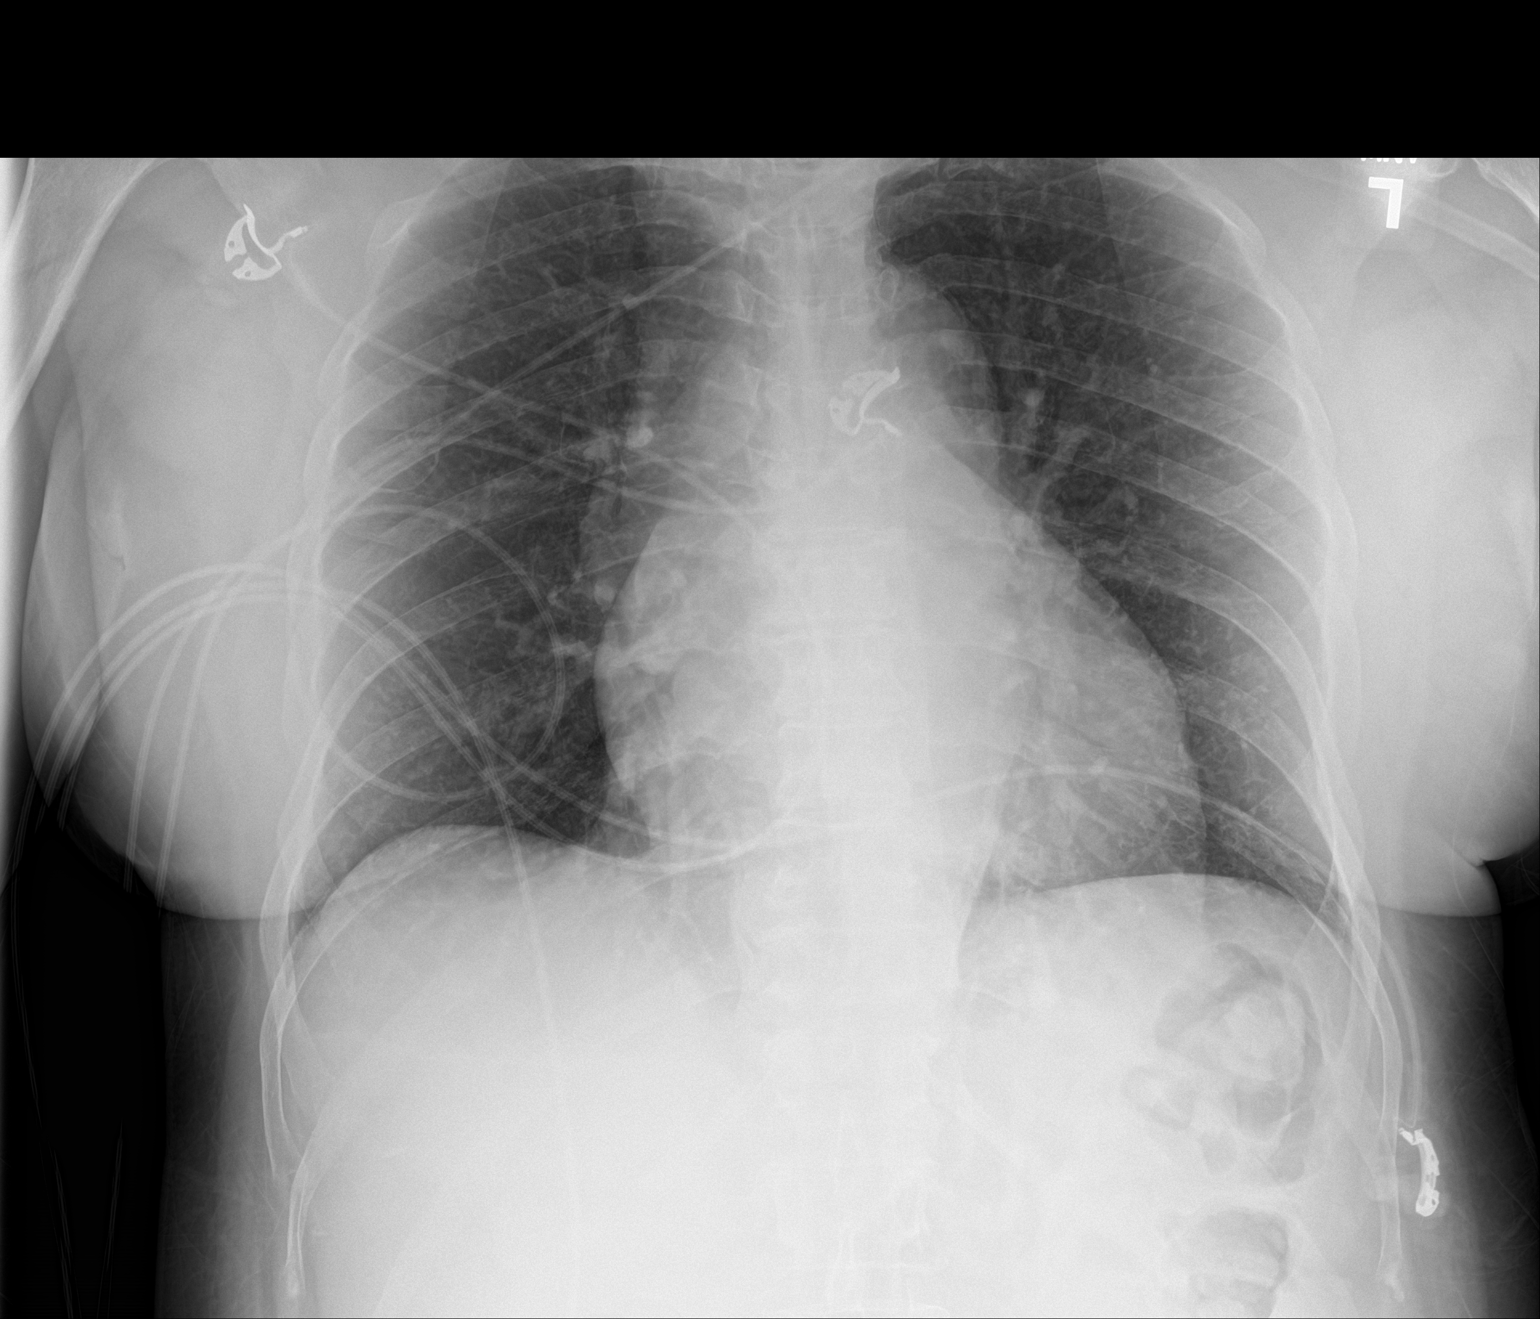

[1 of 1 positions shown; findings below may reference images not displayed]

FINDINGS: Cardiac silhouette upper normal in size for AP technique. Thoracic
aorta mildly tortuous. Hilar and mediastinal contours otherwise
unremarkable. Lungs clear. Bronchovascular markings normal.
Pulmonary vascularity normal. No visible pleural effusions. No
pneumothorax.
IMPRESSION: No acute cardiopulmonary disease.

## 2023-04-08 ENCOUNTER — Encounter: Payer: Self-pay | Admitting: Orthopaedic Surgery

## 2023-04-08 ENCOUNTER — Ambulatory Visit (INDEPENDENT_AMBULATORY_CARE_PROVIDER_SITE_OTHER): Payer: BC Managed Care – PPO | Admitting: Orthopaedic Surgery

## 2023-04-08 DIAGNOSIS — G8929 Other chronic pain: Secondary | ICD-10-CM

## 2023-04-08 DIAGNOSIS — M25562 Pain in left knee: Secondary | ICD-10-CM

## 2023-04-08 DIAGNOSIS — M1712 Unilateral primary osteoarthritis, left knee: Secondary | ICD-10-CM | POA: Diagnosis not present

## 2023-04-08 NOTE — Progress Notes (Signed)
The patient comes in today again to discuss knee replacement surgery for her left knee.  I did review her chart and she is not on blood thinning medication now and her hemoglobin A1c is below 7.  She had had vascular bypass surgery and a stent on her right lower extremity.  This was performed by Dr. Lemar Livings.  Her left knee has severe well-documented end-stage arthritis and she is in need of knee replacement surgery at this point given that her daily pain and the deformity that is developed with her knee with time.  I have reviewed all of her previous notes and her x-rays.  We did go over x-rays again of her left knee showing bone-on-bone wear with varus malalignment.  There is osteophytes in all 3 compartments.  On exam she lacks full extension by at least 5 degrees and her flexion is limited secondary to severe pain.  There is varus malalignment that is not correctable and significant crepitation.  Her pain is severe as well.  She walks with a significant limp.  We did go over extensively knee replacement surgery and what this involves.  We discussed the risks and benefits of the surgery and what to expect from an intraoperative and postoperative standpoint.  We will work on getting this scheduled in the near future.

## 2023-04-15 ENCOUNTER — Other Ambulatory Visit (HOSPITAL_COMMUNITY): Payer: BC Managed Care – PPO

## 2023-04-15 ENCOUNTER — Other Ambulatory Visit: Payer: Self-pay | Admitting: Family Medicine

## 2023-04-15 ENCOUNTER — Encounter (HOSPITAL_COMMUNITY): Payer: BC Managed Care – PPO

## 2023-04-15 DIAGNOSIS — E1159 Type 2 diabetes mellitus with other circulatory complications: Secondary | ICD-10-CM

## 2023-04-19 ENCOUNTER — Other Ambulatory Visit: Payer: Self-pay | Admitting: Family Medicine

## 2023-04-19 DIAGNOSIS — I152 Hypertension secondary to endocrine disorders: Secondary | ICD-10-CM

## 2023-05-05 ENCOUNTER — Ambulatory Visit: Payer: BC Managed Care – PPO | Attending: Family Medicine | Admitting: Family Medicine

## 2023-05-05 DIAGNOSIS — I160 Hypertensive urgency: Secondary | ICD-10-CM

## 2023-05-05 DIAGNOSIS — M1712 Unilateral primary osteoarthritis, left knee: Secondary | ICD-10-CM

## 2023-05-05 MED ORDER — CHLORTHALIDONE 25 MG PO TABS
25.0000 mg | ORAL_TABLET | Freq: Every day | ORAL | 1 refills | Status: DC
Start: 2023-05-05 — End: 2023-11-24

## 2023-05-05 NOTE — Progress Notes (Signed)
Virtual Visit via Telephone Note  I connected with Tammy Graham, on 05/05/2023 at 9:05 AM by telephone and verified that I am speaking with the correct person using two identifiers.   Consent: I discussed the limitations, risks, security and privacy concerns of performing an evaluation and management service by telephone and the availability of in person appointments. I also discussed with the patient that there may be a patient responsible charge related to this service. The patient expressed understanding and agreed to proceed.   Location of Patient: Home  Location of Provider: Clinic   Persons participating in Telemedicine visit: [name] Dr. Alvis Lemmings     History of Present Illness: Tammy Graham is a 72 y.o. year old female with a history of coronary artery disease (status post DES), type 2 diabetes mellitus (A1c 6.7), hypertension, PAD s/p right common femoral to above-knee popliteal bypass in 02/2021, status post right femoral artery stenting 02/2023, L knee OA, tobacco abuse.   Discussed the use of AI scribe software for clinical note transcription with the patient, who gave verbal consent to proceed.   The patient, with a history of hypertension, presents with persistently elevated blood pressure readings of 150-160 mmHg despite current medication. She reports compliance with her current regimen and has been trying to stay calm and avoid stressors. She expresses concern about her upcoming knee surgery being postponed due to her uncontrolled hypertension. She also reports a tingling sensation in her left arm, which occurred when her blood pressure was in the 180 systolic prior to regimen adjustment at her last visit.  Symptoms have since resolved.  In addition to her hypertension, the patient is also dealing with a knee condition that has resulted in significant disability, to the point where she can hardly walk. She is eager to proceed with the planned knee surgery,  which has been repeatedly postponed. She is currently on short term and long term disability due to her knee condition.   Today she is requesting completion of FMLA paperwork due to the fact that she had been out of work when blood pressure was elevated from 02/28/2023 and will also need this to cover her through recovery.  Postsurgery.  Left total knee arthroplasty is scheduled for 06/09/2023.  Past Medical History:  Diagnosis Date   Anxiety    Arthritis    shoulder and knees   CAD (coronary artery disease)    Complication of anesthesia    states she was still awake one time when surgery started   DM (diabetes mellitus) (HCC)    HTN (hypertension)    Hyperlipemia    Myocardial infarction [facility])    Peripheral vascular disease (HCC)    Pneumonia    Sleep apnea    no cpap   Stroke [facility])    2012   Allergies  Allergen Reactions   Benadryl [Diphenhydramine Hcl (Sleep)] Other (See Comments)    unknown   Plavix [Clopidogrel Bisulfate] Itching   Rosuvastatin Hives    Patient is allergic to generic rosuvastatin. She says she is able to take branded Crestor.    Simvastatin Other (See Comments)    unknown    Current Outpatient Medications on File Prior to Visit  Medication Sig Dispense Refill   acetaminophen-codeine (TYLENOL #3) 300-30 MG tablet Take 1 tablet by mouth 2 (two) times daily as needed for moderate pain. 60 tablet 2   aspirin EC 81 MG tablet Take 81 mg by mouth in the morning. Swallow whole.  atorvastatin (LIPITOR) 10 MG tablet TAKE 1 TABLET BY MOUTH EVERY DAY 90 tablet 0   atorvastatin (LIPITOR) 20 MG tablet Take 1 tablet (20 mg total) by mouth daily. 90 tablet 1   Blood Glucose Monitoring Suppl (ONETOUCH VERIO) w/Device KIT Use to test blood sugar 3 times daily 1 kit 0   carboxymethylcellul-glycerin (LUBRICANT DROPS/DUAL-ACTION) 0.5-0.9 % ophthalmic solution Place 1-2 drops into both eyes 3 (three) times daily as needed for dry eyes.     carvedilol (COREG) 12.5 MG tablet  TAKE 1 TABLET(12.5 MG) BY MOUTH TWICE DAILY WITH A MEAL 180 tablet 1   cetirizine (ZYRTEC ALLERGY) 10 MG tablet Take 1 tablet (10 mg total) by mouth daily. 14 tablet 0   FARXIGA 10 MG TABS tablet Take 1 tablet (10 mg total) by mouth daily before breakfast. 90 tablet 1   fluconazole (DIFLUCAN) 150 MG tablet Take 1 tablet (150 mg total) by mouth daily. 5 tablet 0   gabapentin (NEURONTIN) 100 MG capsule Take 1 capsule (100 mg total) by mouth 3 (three) times daily. 90 capsule 3   glipiZIDE (GLUCOTROL XL) 10 MG 24 hr tablet TAKE 1 TABLET(10 MG) BY MOUTH DAILY WITH BREAKFAST* DISCONTINUE IMMEDIATE RELEASE GLIPIZIDE* 90 tablet 1   glucose blood (ONETOUCH VERIO) test strip Use to test blood sugar 3 times daily 100 each 12   hydrocortisone cream 1 % Apply to affected area 2 times daily 15 g 0   Lancets (ONETOUCH ULTRASOFT) lancets Use to test blood sugar 3 times daily 100 each 12   metroNIDAZOLE (FLAGYL) 500 MG tablet Take 1 tablet (500 mg total) by mouth 2 (two) times daily. 14 tablet 0   Misc. Devices MISC Shower bench, Above the toilet seat.  Diagnosis- Type 2 Diabetes Mellitus 1 each 0   Misc. Devices MISC Blood pressure monitor.  Diagnosis hypertension 1 each 0   Multiple Vitamin (MULTIVITAMIN WITH MINERALS) TABS tablet Take 1 tablet by mouth in the morning.     nicotine (NICODERM CQ - DOSED IN MG/24 HOURS) 14 mg/24hr patch Place 1 patch (14 mg total) onto the skin daily. 28 patch 0   nitroGLYCERIN (NITROSTAT) 0.4 MG SL tablet Place 0.4 mg under the tongue every 5 (five) minutes as needed for chest pain.     PRESCRIPTION MEDICATION Apply 1 application topically in the morning and at bedtime. Kamea Foot Cream     valsartan (DIOVAN) 320 MG tablet Take 1 tablet (320 mg total) by mouth daily. 90 tablet 1   No current facility-administered medications on file prior to visit.    ROS: See HPI  Observations/Objective: Awake, alert, oriented x3 Not in acute distress Normal mood      Latest Ref Rng  & Units 02/09/2023    9:24 AM 12/08/2022   12:18 PM 08/05/2022   11:57 AM  CMP  Glucose 70 - 99 mg/dL 782  89  64   BUN 8 - 23 mg/dL 18  12  13    Creatinine 0.44 - 1.00 mg/dL 9.56  2.13  0.86   Sodium 135 - 145 mmol/L 144  145  144   Potassium 3.5 - 5.1 mmol/L 4.0  4.4  4.1   Chloride 98 - 111 mmol/L 108  106  103   CO2 20 - 29 mmol/L  24  27   Calcium 8.7 - 10.3 mg/dL  57.8  46.9   Total Protein 6.0 - 8.5 g/dL  6.5    Total Bilirubin 0.0 - 1.2 mg/dL  0.8  Alkaline Phos 44 - 121 IU/L  122    AST 0 - 40 IU/L  18    ALT 0 - 32 IU/L  17      Lipid Panel     Component Value Date/Time   CHOL 133 12/08/2022 1218   TRIG 61 12/08/2022 1218   HDL 65 12/08/2022 1218   CHOLHDL 2.2 02/28/2021 0155   VLDL 12 02/28/2021 0155   LDLCALC 55 12/08/2022 1218   LABVLDL 13 12/08/2022 1218    Lab Results  Component Value Date   HGBA1C 6.5 12/08/2022    Assessment and Plan: 1. Hypertensive urgency Uncontrolled She previously had elevated blood pressure of 180 systolic Chlorthalidone added to regimen Continue valsartan and carvedilol Will reassess blood pressure at next visit -Counseled on blood pressure goal of less than 130/80, low-sodium, DASH diet, medication compliance, 150 minutes of moderate intensity exercise per week. Discussed medication compliance, adverse effects. - chlorthalidone (HYGROTON) 25 MG tablet; Take 1 tablet (25 mg total) by mouth daily.  Dispense: 90 tablet; Refill: 1  2. Osteoarthritis of left knee, unspecified osteoarthritis type Completed FMLA paperwork   Follow Up Instructions: Keep upcoming appointment for 05/27/2023   I discussed the assessment and treatment plan with the patient. The patient was provided an opportunity to ask questions and all were answered. The patient agreed with the plan and demonstrated an understanding of the instructions.   The patient was advised to call back or seek an in-person evaluation if the symptoms worsen or if the  condition fails to improve as anticipated.     I provided 11 minutes total of non-face-to-face time during this encounter.   Hoy Register, MD, FAAFP. [facility] [place], [place] [phone]   05/05/2023, 9:05 AM

## 2023-05-14 ENCOUNTER — Other Ambulatory Visit: Payer: Self-pay | Admitting: Family Medicine

## 2023-05-14 NOTE — Telephone Encounter (Signed)
Requested medications are due for refill today.  yes  Requested medications are on the active medications list.  yes  Last refill. 02/27/2023 #5 0 rf  Future visit scheduled.   yes  Notes to clinic.  Medication not assigned a protocol, please review for refill.    Requested Prescriptions  Pending Prescriptions Disp Refills   fluconazole (DIFLUCAN) 150 MG tablet [Pharmacy Med Name: FLUCONAZOLE 150MG  TABLETS] 5 tablet 0    Sig: TAKE 1 TABLET BY MOUTH DAILY     Off-Protocol Failed - 05/14/2023  2:06 PM      Failed - Medication not assigned to a protocol, review manually.      Passed - Valid encounter within last 12 months    Recent Outpatient Visits           1 week ago Hypertensive urgency   [facility] [place], [place], MD   2 months ago Type 2 diabetes mellitus with diabetic polyneuropathy, without long-term current use of insulin (HCC)   [place] [facility] [place], [place], MD   5 months ago Type 2 diabetes mellitus with diabetic polyneuropathy, without long-term current use of insulin (HCC)   [facility] [place], [place], MD   9 months ago Type 2 diabetes mellitus with diabetic polyneuropathy, without long-term current use of insulin (HCC)   [facility] [place], Odette Horns, MD   11 months ago Type 2 diabetes mellitus with diabetic polyneuropathy, without long-term current use of insulin (HCC)   [place] [facility] Hoy Register, MD       Future Appointments             In 1 week Hoy Register, MD [facility]

## 2023-05-22 ENCOUNTER — Other Ambulatory Visit: Payer: Self-pay | Admitting: Family Medicine

## 2023-05-22 DIAGNOSIS — E1159 Type 2 diabetes mellitus with other circulatory complications: Secondary | ICD-10-CM

## 2023-05-27 ENCOUNTER — Encounter: Payer: Self-pay | Admitting: Family Medicine

## 2023-05-27 ENCOUNTER — Ambulatory Visit: Payer: BC Managed Care – PPO | Attending: Family Medicine | Admitting: Family Medicine

## 2023-05-27 VITALS — BP 157/79 | HR 68 | Temp 98.0°F | Ht 66.0 in | Wt 160.2 lb

## 2023-05-27 DIAGNOSIS — Z7984 Long term (current) use of oral hypoglycemic drugs: Secondary | ICD-10-CM

## 2023-05-27 DIAGNOSIS — M1712 Unilateral primary osteoarthritis, left knee: Secondary | ICD-10-CM

## 2023-05-27 DIAGNOSIS — F17210 Nicotine dependence, cigarettes, uncomplicated: Secondary | ICD-10-CM

## 2023-05-27 DIAGNOSIS — I739 Peripheral vascular disease, unspecified: Secondary | ICD-10-CM | POA: Diagnosis not present

## 2023-05-27 DIAGNOSIS — E1159 Type 2 diabetes mellitus with other circulatory complications: Secondary | ICD-10-CM | POA: Diagnosis not present

## 2023-05-27 DIAGNOSIS — E1169 Type 2 diabetes mellitus with other specified complication: Secondary | ICD-10-CM | POA: Diagnosis not present

## 2023-05-27 DIAGNOSIS — E1142 Type 2 diabetes mellitus with diabetic polyneuropathy: Secondary | ICD-10-CM | POA: Diagnosis not present

## 2023-05-27 DIAGNOSIS — E785 Hyperlipidemia, unspecified: Secondary | ICD-10-CM

## 2023-05-27 DIAGNOSIS — I2510 Atherosclerotic heart disease of native coronary artery without angina pectoris: Secondary | ICD-10-CM

## 2023-05-27 DIAGNOSIS — I152 Hypertension secondary to endocrine disorders: Secondary | ICD-10-CM

## 2023-05-27 LAB — POCT GLYCOSYLATED HEMOGLOBIN (HGB A1C): HbA1c, POC (controlled diabetic range): 7.2 % — AB (ref 0.0–7.0)

## 2023-05-27 MED ORDER — ATORVASTATIN CALCIUM 20 MG PO TABS
20.0000 mg | ORAL_TABLET | Freq: Every day | ORAL | 1 refills | Status: DC
Start: 2023-05-27 — End: 2023-11-24

## 2023-05-27 MED ORDER — NICOTINE 14 MG/24HR TD PT24: 14.0000 mg | MEDICATED_PATCH | Freq: Every day | TRANSDERMAL | 2 refills | Status: AC

## 2023-05-27 NOTE — Patient Instructions (Signed)

## 2023-05-27 NOTE — Progress Notes (Signed)
Surgical Instructions   Your procedure is scheduled on Tuesday June 09, 2023. Report to [facility] Main Entrance "A" at 5:30 A.M., then check in with the Admitting office. Any questions or running late day of surgery: call [phone]  Questions prior to your surgery date: call [phone], Monday-Friday, 8am-4pm. If you experience any cold or flu symptoms such as cough, fever, chills, shortness of breath, etc. between now and your scheduled surgery, please notify us at the above number.     Remember:  Do not eat after midnight the night before your surgery  You may drink clear liquids until 4:30 the morning of your surgery.   Clear liquids allowed are: Water, Non-Citrus Juices (without pulp), Carbonated Beverages, Clear Tea, Black Coffee Only (NO MILK, CREAM OR POWDERED CREAMER of any kind), and Gatorade.    Take these medicines the morning of surgery with A SIP OF WATER  atorvastatin (LIPITOR)  carvedilol (COREG)  fluconazole (DIFLUCAN)  Naphazoline-Pheniramine (VISINE OP)    May take these medicines IF NEEDED: acetaminophen-codeine (TYLENOL #3)    Follow your surgeon's instructions on when to stop aspirin EC.  If no instructions were given by your surgeon then you will need to call the office to get those instructions.     One week prior to surgery, STOP taking any Aleve, Naproxen, Ibuprofen, Motrin, Advil, Goody's, BC's, all herbal medications, fish oil, and non-prescription vitamins.    WHAT DO I DO ABOUT MY DIABETES MEDICATION?   Do not take oral diabetes medicines (pills) the morning of surgery. DO NOT TAKE YOUR glipiZIDE (GLUCOTROL XL) THE DAY OF SURGERY.   STOP TAKING YOUR FARXIGA 72 HOURS PRIOR TO SURGERY.  Your last dose will be 06/05/2023.    The day of surgery, do not take other diabetes injectables, including Byetta (exenatide), Bydureon (exenatide ER), Victoza (liraglutide), or Trulicity (dulaglutide).  If your CBG is greater than 220 mg/dL, you may  take  of your sliding scale (correction) dose of insulin.   HOW TO MANAGE YOUR DIABETES BEFORE AND AFTER SURGERY  Why is it important to control my blood sugar before and after surgery? Improving blood sugar levels before and after surgery helps healing and can limit problems. A way of improving blood sugar control is eating a healthy diet by:  Eating less sugar and carbohydrates  Increasing activity/exercise  Talking with your doctor about reaching your blood sugar goals High blood sugars (greater than 180 mg/dL) can raise your risk of infections and slow your recovery, so you will need to focus on controlling your diabetes during the weeks before surgery. Make sure that the doctor who takes care of your diabetes knows about your planned surgery including the date and location.  How do I manage my blood sugar before surgery? Check your blood sugar at least 4 times a day, starting 2 days before surgery, to make sure that the level is not too high or low.  Check your blood sugar the morning of your surgery when you wake up and every 2 hours until you get to the Short Stay unit.  If your blood sugar is less than 70 mg/dL, you will need to treat for low blood sugar: Do not take insulin. Treat a low blood sugar (less than 70 mg/dL) with  cup of clear juice (cranberry or apple), 4 glucose tablets, OR glucose gel. Recheck blood sugar in 15 minutes after treatment (to make sure it is greater than 70 mg/dL). If your blood sugar is not greater than 70  mg/dL on recheck, call [phone] for further instructions. Report your blood sugar to the short stay nurse when you get to Short Stay.  If you are admitted to the hospital after surgery: Your blood sugar will be checked by the staff and you will probably be given insulin after surgery (instead of oral diabetes medicines) to make sure you have good blood sugar levels. The goal for blood sugar control after surgery is 80-180 mg/dL.                       Do NOT Smoke (Tobacco/Vaping) for 24 hours prior to your procedure.  If you use a CPAP at night, you may bring your mask/headgear for your overnight stay.   You will be asked to remove any contacts, glasses, piercing's, hearing aid's, dentures/partials prior to surgery. Please bring cases for these items if needed.    Patients discharged the day of surgery will not be allowed to drive home, and someone needs to stay with them for 24 hours.  SURGICAL WAITING ROOM VISITATION Patients may have no more than 2 support people in the waiting area - these visitors may rotate.   Pre-op nurse will coordinate an appropriate time for 1 ADULT support person, who may not rotate, to accompany patient in pre-op.  Children under the age of 85 must have an adult with them who is not the patient and must remain in the main waiting area with an adult.  If the patient needs to stay at the hospital during part of their recovery, the visitor guidelines for inpatient rooms apply.  Please refer to the [facility] website for the visitor guidelines for any additional information.   If you received a COVID test during your pre-op visit  it is requested that you wear a mask when out in public, stay away from anyone that may not be feeling well and notify your surgeon if you develop symptoms. If you have been in contact with anyone that has tested positive in the last 10 days please notify you surgeon.      Pre-operative 5 CHG Bathing Instructions   You can play a key role in reducing the risk of infection after surgery. Your skin needs to be as free of germs as possible. You can reduce the number of germs on your skin by washing with CHG (chlorhexidine gluconate) soap before surgery. CHG is an antiseptic soap that kills germs and continues to kill germs even after washing.   DO NOT use if you have an allergy to chlorhexidine/CHG or antibacterial soaps. If your skin becomes reddened or irritated, stop using  the CHG and notify one of our RNs at [phone].   Please shower with the CHG soap starting 4 days before surgery using the following schedule:     Please keep in mind the following:  DO NOT shave, including legs and underarms, starting the day of your first shower.   You may shave your face at any point before/day of surgery.  Place clean sheets on your bed the day you start using CHG soap. Use a clean washcloth (not used since being washed) for each shower. DO NOT sleep with pets once you start using the CHG.   CHG Shower Instructions:  If you choose to wash your hair and private area, wash first with your normal shampoo/soap.  After you use shampoo/soap, rinse your hair and body thoroughly to remove shampoo/soap residue.  Turn the water OFF and apply about 3  tablespoons (45 ml) of CHG soap to a CLEAN washcloth.  Apply CHG soap ONLY FROM YOUR NECK DOWN TO YOUR TOES (washing for 3-5 minutes)  DO NOT use CHG soap on face, private areas, open wounds, or sores.  Pay special attention to the area where your surgery is being performed.  If you are having back surgery, having someone wash your back for you may be helpful. Wait 2 minutes after CHG soap is applied, then you may rinse off the CHG soap.  Pat dry with a clean towel  Put on clean clothes/pajamas   If you choose to wear lotion, please use ONLY the CHG-compatible lotions on the back of this paper.   Additional instructions for the day of surgery: DO NOT APPLY any lotions, deodorants or perfumes.   Do not bring valuables to the hospital. [facility] is not responsible for any belongings/valuables. Do not wear nail polish, gel polish, artificial nails, or any other type of covering on natural nails (fingers and toes) Do not wear jewelry or makeup Put on clean/comfortable clothes.  Please brush your teeth.  Ask your nurse before applying any prescription medications to the skin.     CHG Compatible Lotions   Aveeno  Moisturizing lotion  Cetaphil Moisturizing Cream  Cetaphil Moisturizing Lotion  Clairol Herbal Essence Moisturizing Lotion, Dry Skin  Clairol Herbal Essence Moisturizing Lotion, Extra Dry Skin  Clairol Herbal Essence Moisturizing Lotion, Normal Skin  Curel Age Defying Therapeutic Moisturizing Lotion with Alpha Hydroxy  Curel Extreme Care Body Lotion  Curel Soothing Hands Moisturizing Hand Lotion  Curel Therapeutic Moisturizing Cream, Fragrance-Free  Curel Therapeutic Moisturizing Lotion, Fragrance-Free  Curel Therapeutic Moisturizing Lotion, Original Formula  Eucerin Daily Replenishing Lotion  Eucerin Dry Skin Therapy Plus Alpha Hydroxy Crme  Eucerin Dry Skin Therapy Plus Alpha Hydroxy Lotion  Eucerin Original Crme  Eucerin Original Lotion  Eucerin Plus Crme Eucerin Plus Lotion  Eucerin TriLipid Replenishing Lotion  Keri Anti-Bacterial Hand Lotion  Keri Deep Conditioning Original Lotion Dry Skin Formula Softly Scented  Keri Deep Conditioning Original Lotion, Fragrance Free Sensitive Skin Formula  Keri Lotion Fast Absorbing Fragrance Free Sensitive Skin Formula  Keri Lotion Fast Absorbing Softly Scented Dry Skin Formula  Keri Original Lotion  Keri Skin Renewal Lotion Keri Silky Smooth Lotion  Keri Silky Smooth Sensitive Skin Lotion  Nivea Body Creamy Conditioning Oil  Nivea Body Extra Enriched Lotion  Nivea Body Original Lotion  Nivea Body Sheer Moisturizing Lotion Nivea Crme  Nivea Skin Firming Lotion  NutraDerm 30 Skin Lotion  NutraDerm Skin Lotion  NutraDerm Therapeutic Skin Cream  NutraDerm Therapeutic Skin Lotion  ProShield Protective Hand Cream  Provon moisturizing lotion  Please read over the following fact sheets that you were given.

## 2023-05-27 NOTE — Progress Notes (Signed)
Subjective:  Patient ID: Tammy Graham, female    DOB: January 17, 1951  Age: 72 y.o. MRN: [id number]  CC: Medical Management of Chronic Issues   HPI Tammy Graham is a 72 y.o. year old female with a history of coronary artery disease (status post DES), type 2 diabetes mellitus (A1c 6.7), hypertension, PAD s/p right common femoral to above-knee popliteal bypass in 02/2021, status post right femoral artery stenting 02/2023, L knee OA, tobacco abuse.   Interval History: Discussed the use of AI scribe software for clinical note transcription with the patient, who gave verbal consent to proceed.   She presents with a chief complaint of headache attributed to recently started Chlorthalidone. She describes the headache as severe, stating it's 'like sucking my life out of me' and causing her to feel like a 'walking zombie.' She reports taking the medication for three days before discontinuing it due to the intolerable side effects. She has been taking carvedilol and Diovan for her blood pressure and today it is still elevated at 157/79.  Of note in the past she has complained of headaches with several other antihypertensives.  She is scheduled for left TKA in 2 weeks.  She expresses a strong desire to proceed with the surgery and is willing to endure the side effects of the Chlorthalidone to achieve this. She has no chest pain or dyspnea and has a good exercise tolerance.  She currently lives alone and will not have any help at home after her surgery.  In addition to her hypertension, the patient's diabetes is well controlled with Glipizide and Farxiga, although her recent A1c has increased slightly from 6.5 to 7.2. She admits to frequent consumption of [employer], which may be contributing to the increase.  The patient also reports a history of smoking and is currently trying to cut back. She requests nicotine patches to assist with this.        Past Medical History:  Diagnosis Date   Anxiety     Arthritis    shoulder and knees   CAD (coronary artery disease)    Complication of anesthesia    states she was still awake one time when surgery started   DM (diabetes mellitus) (HCC)    HTN (hypertension)    Hyperlipemia    Myocardial infarction [facility])    Peripheral vascular disease (HCC)    Pneumonia    Sleep apnea    no cpap   Stroke [facility])    2012    Past Surgical History:  Procedure Laterality Date   ABDOMINAL AORTOGRAM W/LOWER EXTREMITY Bilateral 02/11/2021   Procedure: ABDOMINAL AORTOGRAM W/LOWER EXTREMITY;  Surgeon: Maeola Harman, MD;  Location: [facility] INVASIVE CV LAB;  Service: Cardiovascular;  Laterality: Bilateral;   ABDOMINAL AORTOGRAM W/LOWER EXTREMITY Right 02/09/2023   Procedure: ABDOMINAL AORTOGRAM W/LOWER EXTREMITY;  Surgeon: Maeola Harman, MD;  Location: [facility] INVASIVE CV LAB;  Service: Cardiovascular;  Laterality: Right;   CARDIAC CATHETERIZATION     COLONOSCOPY     COLONOSCOPY  09/25/2011   Procedure: COLONOSCOPY;  Surgeon: Vertell Novak., MD;  Location: [facility] ENDOSCOPY;  Service: Endoscopy;  Laterality: N/A;   CORONARY STENT PLACEMENT     Ostial LAD stent in 2012   FEMORAL-POPLITEAL BYPASS GRAFT Right 02/27/2021   Procedure: RIGHT COMMON FEMORAL- ABOVE KNEE POPLITEAL ARTERY BYPASS GRAFT;  Surgeon: Maeola Harman, MD;  Location: [facility] OR;  Service: Vascular;  Laterality: Right;   Left knee surgery     PERIPHERAL VASCULAR INTERVENTION Right  02/09/2023   Procedure: PERIPHERAL VASCULAR INTERVENTION;  Surgeon: Maeola Harman, MD;  Location: [facility] INVASIVE CV LAB;  Service: Cardiovascular;  Laterality: Right;  RT SFA    Family History  Problem Relation Age of Onset   Heart attack Mother    Anesthesia problems Neg Hx    Hypotension Neg Hx    Malignant hyperthermia Neg Hx    Pseudochol deficiency Neg Hx     Social History   Socioeconomic History   Marital status: Widowed    Spouse name: Not on file   Number of children:  Not on file   Years of education: Not on file   Highest education level: Not on file  Occupational History   Not on file  Tobacco Use   Smoking status: Every Day    Current packs/day: 0.00    Average packs/day: 0.3 packs/day for 15.0 years (3.8 ttl pk-yrs)    Types: Cigarettes    Start date: 02/27/2006    Last attempt to quit: 02/27/2021    Years since quitting: 2.2   Smokeless tobacco: Never   Tobacco comments:    4-5 daily  Vaping Use   Vaping status: Never Used  Substance and Sexual Activity   Alcohol use: Not Currently    Alcohol/week: 1.0 standard drink of alcohol    Types: 1 Standard drinks or equivalent per week    Comment: occasionally    Drug use: No   Sexual activity: Not on file  Other Topics Concern   Not on file  Social History Narrative   Not on file   Social Determinants of Health   Financial Resource Strain: Not on file  Food Insecurity: Not on file  Transportation Needs: Not on file  Physical Activity: Not on file  Stress: Not on file  Social Connections: Unknown (03/18/2023)   [facility] from [facility]   Social Network    Social Network: Not on file    Allergies  Allergen Reactions   Plavix [Clopidogrel Bisulfate] Itching   Rosuvastatin Hives    Patient is allergic to generic rosuvastatin. She says she is able to take branded Crestor.    Simvastatin Other (See Comments)    unknown   Benadryl [Diphenhydramine Hcl (Sleep)] Rash    Outpatient Medications Prior to Visit  Medication Sig Dispense Refill   acetaminophen-codeine (TYLENOL #3) 300-30 MG tablet Take 1 tablet by mouth 2 (two) times daily as needed for moderate pain. 60 tablet 2   aspirin EC 81 MG tablet Take 81 mg by mouth in the morning. Swallow whole.     Blood Glucose Monitoring Suppl (ONETOUCH VERIO) w/Device KIT Use to test blood sugar 3 times daily 1 kit 0   carvedilol (COREG) 12.5 MG tablet TAKE 1 TABLET(12.5 MG) BY MOUTH TWICE DAILY WITH A MEAL 180 tablet 1    chlorthalidone (HYGROTON) 25 MG tablet Take 1 tablet (25 mg total) by mouth daily. 90 tablet 1   FARXIGA 10 MG TABS tablet Take 1 tablet (10 mg total) by mouth daily before breakfast. 90 tablet 1   fluconazole (DIFLUCAN) 150 MG tablet TAKE 1 TABLET BY MOUTH DAILY 5 tablet 0   glipiZIDE (GLUCOTROL XL) 10 MG 24 hr tablet TAKE 1 TABLET(10 MG) BY MOUTH DAILY WITH BREAKFAST* DISCONTINUE IMMEDIATE RELEASE GLIPIZIDE* 90 tablet 1   glucose blood (ONETOUCH VERIO) test strip Use to test blood sugar 3 times daily 100 each 12   Lancets (ONETOUCH ULTRASOFT) lancets Use to test blood sugar 3 times  daily 100 each 12   Misc. Devices MISC Shower bench, Above the toilet seat.  Diagnosis- Type 2 Diabetes Mellitus 1 each 0   Misc. Devices MISC Blood pressure monitor.  Diagnosis hypertension 1 each 0   Multiple Vitamin (MULTIVITAMIN WITH MINERALS) TABS tablet Take 1 tablet by mouth in the morning.     Naphazoline-Pheniramine (VISINE OP) Place 1 drop into both eyes daily.     valsartan (DIOVAN) 320 MG tablet Take 1 tablet (320 mg total) by mouth daily. 90 tablet 1   atorvastatin (LIPITOR) 20 MG tablet Take 1 tablet (20 mg total) by mouth daily. 90 tablet 1   No facility-administered medications prior to visit.     ROS Review of Systems  Constitutional:  Negative for activity change and appetite change.  HENT:  Negative for sinus pressure and sore throat.   Respiratory:  Negative for chest tightness, shortness of breath and wheezing.   Cardiovascular:  Negative for chest pain and palpitations.  Gastrointestinal:  Negative for abdominal distention, abdominal pain and constipation.  Genitourinary: Negative.   Musculoskeletal:        See HPI  Psychiatric/Behavioral:  Negative for behavioral problems and dysphoric mood.     Objective:  BP (!) 157/79   Pulse 68   Temp 98 F (36.7 C) (Oral)   Ht 5\' 6"  (1.676 m)   Wt 160 lb 3.2 oz (72.7 kg)   SpO2 97%   BMI 25.86 kg/m      05/27/2023   10:02 AM 03/11/2023     1:14 PM 03/10/2023    2:09 PM  BP/Weight  Systolic BP 157 162 171  Diastolic BP 79 89 84  Wt. (Lbs) 160.2 154 155.6  BMI 25.86 kg/m2 24.86 kg/m2 25.11 kg/m2      Physical Exam Constitutional:      Appearance: She is well-developed.  Cardiovascular:     Rate and Rhythm: Normal rate.     Heart sounds: Normal heart sounds. No murmur heard. Pulmonary:     Effort: Pulmonary effort is normal.     Breath sounds: Normal breath sounds. No wheezing or rales.  Chest:     Chest wall: No tenderness.  Abdominal:     General: Bowel sounds are normal. There is no distension.     Palpations: Abdomen is soft. There is no mass.     Tenderness: There is no abdominal tenderness.  Musculoskeletal:     Right knee: Normal range of motion. No tenderness.     Left knee: Swelling present. Decreased range of motion. Tenderness present.     Right lower leg: No edema.     Left lower leg: No edema.  Neurological:     Mental Status: She is alert and oriented to person, place, and time.     Gait: Gait abnormal.  Psychiatric:        Mood and Affect: Mood normal.        Latest Ref Rng & Units 02/09/2023    9:24 AM 12/08/2022   12:18 PM 08/05/2022   11:57 AM  CMP  Glucose 70 - 99 mg/dL 161  89  64   BUN 8 - 23 mg/dL 18  12  13    Creatinine 0.44 - 1.00 mg/dL 0.96  0.45  4.09   Sodium 135 - 145 mmol/L 144  145  144   Potassium 3.5 - 5.1 mmol/L 4.0  4.4  4.1   Chloride 98 - 111 mmol/L 108  106  103   CO2  20 - 29 mmol/L  24  27   Calcium 8.7 - 10.3 mg/dL  52.8  41.3   Total Protein 6.0 - 8.5 g/dL  6.5    Total Bilirubin 0.0 - 1.2 mg/dL  0.8    Alkaline Phos 44 - 121 IU/L  122    AST 0 - 40 IU/L  18    ALT 0 - 32 IU/L  17      Lipid Panel     Component Value Date/Time   CHOL 133 12/08/2022 1218   TRIG 61 12/08/2022 1218   HDL 65 12/08/2022 1218   CHOLHDL 2.2 02/28/2021 0155   VLDL 12 02/28/2021 0155   LDLCALC 55 12/08/2022 1218    CBC    Component Value Date/Time   WBC 13.5 (H)  02/28/2021 0155   RBC 3.61 (L) 02/28/2021 0155   HGB 15.6 (H) 02/09/2023 0924   HGB 15.3 07/01/2019 1550   HCT 46.0 02/09/2023 0924   HCT 46.5 07/01/2019 1550   PLT 193 02/28/2021 0155   PLT 257 07/01/2019 1550   MCV 95.3 02/28/2021 0155   MCV 98 (H) 07/01/2019 1550   MCH 32.7 02/28/2021 0155   MCHC 34.3 02/28/2021 0155   RDW 12.7 02/28/2021 0155   RDW 12.6 07/01/2019 1550   LYMPHSABS 2.9 09/11/2016 2316   MONOABS 0.4 09/11/2016 2316   EOSABS 0.2 09/11/2016 2316   BASOSABS 0.0 09/11/2016 2316    Lab Results  Component Value Date   HGBA1C 7.2 (A) 05/27/2023    Assessment & Plan:      Hypertension Patient reports headaches and fatigue with Chlorthalidone. Currently on Carvedilol and Valsartan. -Given she has her preop assessment coming up tomorrow she is willing to endure the side effects of the Chlorthalidone to achieve this. -If blood pressure remains uncontrolled, consider increasing Carvedilol to 25mg  twice daily.  Heart rate is on the lower border of normal at 68 and I am concerned she could develop bradycardia with increase. -Unfortunately she has been intolerant to several other antihypertensives in the past complaining of headache with history of them. -She was on amlodipine in the past and we could retry that again if blood pressure remains uncontrolled.  Type II Diabetes Mellitus A1c increased from 6.5 to 7.2. Patient reports frequent consumption of [employer]. -Continue Glipizide and Farxiga. -Advise patient to limit consumption of fast food and increase intake of home-cooked meals with vegetables.  Hyperlipidemia Patient is currently on Atorvastatin 20mg . -Continue Atorvastatin 20mg  daily.  Tobacco Use Patient reports cutting back on smoking and requests nicotine patches. -Prescribe Nicotine patches 14mg  daily.  Left knee osteoarthritis Patient scheduled for knee surgery. Concerns about post-operative care as patient lives alone. -Coordinate with case  manager for home health services post-surgery.  PVD Status post intervention Risk factor modification especially smoking cessation -Continue Plavix -Continue statin  CAD -Asymptomatic Status post DES -Good functional capacity able to achieve 3 METS Continue statin Risk factor modification  Colon Cancer Screening Patient missed scheduled colonoscopy. -Reschedule colonoscopy after knee surgery.  Eye Exam Patient missed scheduled eye exam due to work commitments. -Reschedule eye exam on a day off work.          Meds ordered this encounter  Medications   nicotine (NICODERM CQ) 14 mg/24hr patch    Sig: Place 1 patch (14 mg total) onto the skin daily.    Dispense:  28 patch    Refill:  2   atorvastatin (LIPITOR) 20 MG tablet    Sig: Take  1 tablet (20 mg total) by mouth daily.    Dispense:  90 tablet    Refill:  1    Follow-up: Return in about 3 months (around 08/27/2023) for Chronic medical conditions.       Hoy Register, MD, FAAFP. [facility] [place], [place] [phone]   05/27/2023, 10:36 AM

## 2023-05-28 ENCOUNTER — Inpatient Hospital Stay (HOSPITAL_COMMUNITY)
Admission: RE | Admit: 2023-05-28 | Discharge: 2023-05-28 | Disposition: A | Discharge status: Home / Self Care | Payer: BC Managed Care – PPO | Source: Ambulatory Visit

## 2023-05-28 NOTE — Progress Notes (Signed)
Surgical Instructions  Your procedure is scheduled on Tuesday June 09, 2023 at 7:30 AM. Report to [facility] Main Entrance "A" at 5:30 AM, then check in with the Admitting office. Any questions or running late day of surgery: call [phone]  Questions prior to your surgery date: call [phone], Monday-Friday, 8am-4pm. If you experience any cold or flu symptoms such as cough, fever, chills, shortness of breath, etc. between now and your scheduled surgery, please notify us at the above number.    Remember:  Do not eat after midnight the night before your surgery  You may drink clear liquids until 4:30 the morning of your surgery.   Clear liquids allowed are: Water, Non-Citrus Juices (without pulp), Carbonated Beverages, Clear Tea, Black Coffee Only (NO MILK, CREAM OR POWDERED CREAMER of any kind), and Gatorade.   Take these medicines the morning of surgery with A SIP OF WATER: atorvastatin (LIPITOR)  carvedilol (COREG)  fluconazole (DIFLUCAN)  Naphazoline-Pheniramine (VISINE OP)   May take these medicines IF NEEDED: acetaminophen-codeine (TYLENOL #3)   Follow your surgeon's instructions on when to stop Aspirin,  If no instructions were given by your surgeon then you will need to call the office to get those instructions.    One week prior to surgery, STOP taking any Aleve, Naproxen, Ibuprofen, Motrin, Advil, Goody's, BC's, all herbal medications, fish oil and non-prescription vitamins.    WHAT DO I DO ABOUT MY DIABETES MEDICATION?   Do not take oral diabetes medicines (pills) the morning of surgery. DO NOT take your Glipizide the day of surgery. STOP TAKING YOUR FARXIGA 72 HOURS PRIOR TO SURGERY.  Your last dose will be 06/05/2023.   The day of surgery, do not take other diabetes injectables, including Byetta (exenatide), Bydureon (exenatide ER), Victoza (liraglutide), or Trulicity (dulaglutide).   HOW TO MANAGE YOUR DIABETES BEFORE AND AFTER SURGERY  Why is it  important to control my blood sugar before and after surgery? Improving blood sugar levels before and after surgery helps healing and can limit problems. A way of improving blood sugar control is eating a healthy diet by:  Eating less sugar and carbohydrates  Increasing activity/exercise  Talking with your doctor about reaching your blood sugar goals High blood sugars (greater than 180 mg/dL) can raise your risk of infections and slow your recovery, so you will need to focus on controlling your diabetes during the weeks before surgery. Make sure that the doctor who takes care of your diabetes knows about your planned surgery including the date and location.  How do I manage my blood sugar before surgery? Check your blood sugar at least 4 times a day, starting 2 days before surgery, to make sure that the level is not too high or low.  Check your blood sugar the morning of your surgery when you wake up and every 2 hours until you get to the Short Stay unit.  If your blood sugar is less than 70 mg/dL, you will need to treat for low blood sugar: Do not take insulin. Treat a low blood sugar (less than 70 mg/dL) with  cup of clear juice (cranberry or apple), 4 glucose tablets, OR glucose gel. Recheck blood sugar in 15 minutes after treatment (to make sure it is greater than 70 mg/dL). If your blood sugar is not greater than 70 mg/dL on recheck, call [phone] for further instructions. Report your blood sugar to the short stay nurse when you get to Short Stay.  If you are admitted to the hospital  after surgery: Your blood sugar will be checked by the staff and you will probably be given insulin after surgery (instead of oral diabetes medicines) to make sure you have good blood sugar levels. The goal for blood sugar control after surgery is 80-180 mg/dL.            Do NOT Smoke (Tobacco/Vaping) for 24 hours prior to your procedure.   You will be asked to remove any contacts, glasses,  piercing's, hearing aid's, dentures/partials prior to surgery. Please bring cases for these items if needed.    SURGICAL WAITING ROOM VISITATION Patients may have no more than 2 support people in the waiting area - these visitors may rotate.   Pre-op nurse will coordinate an appropriate time for 1 ADULT support person, who may not rotate, to accompany patient in pre-op.  Children under the age of 31 must have an adult with them who is not the patient and must remain in the main waiting area with an adult.  If the patient needs to stay at the hospital during part of their recovery, the visitor guidelines for inpatient rooms apply.  Please refer to the [facility] website for the visitor guidelines for any additional information.   If you have been in contact with anyone that has tested positive in the last 10 days please notify you surgeon.      Pre-operative 5 CHG Bathing Instructions   You can play a key role in reducing the risk of infection after surgery. Your skin needs to be as free of germs as possible. You can reduce the number of germs on your skin by washing with CHG (chlorhexidine gluconate) soap before surgery. CHG is an antiseptic soap that kills germs and continues to kill germs even after washing.   DO NOT use if you have an allergy to chlorhexidine/CHG or antibacterial soaps. If your skin becomes reddened or irritated, stop using the CHG and notify one of our RNs at [phone].   Please shower with the CHG soap starting 4 days before surgery using the following schedule:     Please keep in mind the following:  DO NOT shave, including legs and underarms, starting the day of your first shower.   You may shave your face at any point before/day of surgery.  Place clean sheets on your bed the day you start using CHG soap. Use a clean washcloth (not used since being washed) for each shower. DO NOT sleep with pets once you start using the CHG.   CHG Shower Instructions:   If you choose to wash your hair and private area, wash first with your normal shampoo/soap.  After you use shampoo/soap, rinse your hair and body thoroughly to remove shampoo/soap residue.  Turn the water OFF and apply about 3 tablespoons (45 ml) of CHG soap to a CLEAN washcloth.  Apply CHG soap ONLY FROM YOUR NECK DOWN TO YOUR TOES (washing for 3-5 minutes)  DO NOT use CHG soap on face, private areas, open wounds, or sores.  Pay special attention to the area where your surgery is being performed.  If you are having back surgery, having someone wash your back for you may be helpful. Wait 2 minutes after CHG soap is applied, then you may rinse off the CHG soap.  Pat dry with a clean towel  Put on clean clothes/pajamas   If you choose to wear lotion, please use ONLY the CHG-compatible lotions on the back of this paper.   Additional instructions for  the day of surgery: DO NOT APPLY any lotions, deodorants or perfumes.   Do not bring valuables to the hospital. [facility] is not responsible for any belongings/valuables. Do not wear nail polish, gel polish, artificial nails, or any other type of covering on natural nails (fingers and toes) Do not wear jewelry or makeup Put on clean/comfortable clothes.  Please brush your teeth.  Ask your nurse before applying any prescription medications to the skin.     CHG Compatible Lotions   Aveeno Moisturizing lotion  Cetaphil Moisturizing Cream  Cetaphil Moisturizing Lotion  Clairol Herbal Essence Moisturizing Lotion, Dry Skin  Clairol Herbal Essence Moisturizing Lotion, Extra Dry Skin  Clairol Herbal Essence Moisturizing Lotion, Normal Skin  Curel Age Defying Therapeutic Moisturizing Lotion with Alpha Hydroxy  Curel Extreme Care Body Lotion  Curel Soothing Hands Moisturizing Hand Lotion  Curel Therapeutic Moisturizing Cream, Fragrance-Free  Curel Therapeutic Moisturizing Lotion, Fragrance-Free  Curel Therapeutic Moisturizing Lotion, Original  Formula  Eucerin Daily Replenishing Lotion  Eucerin Dry Skin Therapy Plus Alpha Hydroxy Crme  Eucerin Dry Skin Therapy Plus Alpha Hydroxy Lotion  Eucerin Original Crme  Eucerin Original Lotion  Eucerin Plus Crme Eucerin Plus Lotion  Eucerin TriLipid Replenishing Lotion  Keri Anti-Bacterial Hand Lotion  Keri Deep Conditioning Original Lotion Dry Skin Formula Softly Scented  Keri Deep Conditioning Original Lotion, Fragrance Free Sensitive Skin Formula  Keri Lotion Fast Absorbing Fragrance Free Sensitive Skin Formula  Keri Lotion Fast Absorbing Softly Scented Dry Skin Formula  Keri Original Lotion  Keri Skin Renewal Lotion Keri Silky Smooth Lotion  Keri Silky Smooth Sensitive Skin Lotion  Nivea Body Creamy Conditioning Oil  Nivea Body Extra Enriched Lotion  Nivea Body Original Lotion  Nivea Body Sheer Moisturizing Lotion Nivea Crme  Nivea Skin Firming Lotion  NutraDerm 30 Skin Lotion  NutraDerm Skin Lotion  NutraDerm Therapeutic Skin Cream  NutraDerm Therapeutic Skin Lotion  ProShield Protective Hand Cream  Provon moisturizing lotion  Please read over the fact sheets that you were given.

## 2023-06-01 ENCOUNTER — Other Ambulatory Visit: Payer: Self-pay

## 2023-06-01 ENCOUNTER — Encounter (HOSPITAL_COMMUNITY): Payer: Self-pay

## 2023-06-01 ENCOUNTER — Encounter (HOSPITAL_COMMUNITY)
Admission: RE | Admit: 2023-06-01 | Discharge: 2023-06-01 | Disposition: A | Discharge status: Home / Self Care | Payer: BC Managed Care – PPO | Source: Ambulatory Visit | Attending: Orthopaedic Surgery | Admitting: Orthopaedic Surgery

## 2023-06-01 DIAGNOSIS — Z01812 Encounter for preprocedural laboratory examination: Secondary | ICD-10-CM | POA: Diagnosis not present

## 2023-06-01 DIAGNOSIS — Z01818 Encounter for other preprocedural examination: Secondary | ICD-10-CM

## 2023-06-01 DIAGNOSIS — M1712 Unilateral primary osteoarthritis, left knee: Secondary | ICD-10-CM | POA: Insufficient documentation

## 2023-06-01 HISTORY — DX: Family history of other specified conditions: Z84.89

## 2023-06-01 LAB — CBC
HCT: 44.8 % (ref 36.0–46.0)
Hemoglobin: 15.3 g/dL — ABNORMAL HIGH (ref 12.0–15.0)
MCH: 33.6 pg (ref 26.0–34.0)
MCHC: 34.2 g/dL (ref 30.0–36.0)
MCV: 98.2 fL (ref 80.0–100.0)
Platelets: 206 10*3/uL (ref 150–400)
RBC: 4.56 MIL/uL (ref 3.87–5.11)
RDW: 13.2 % (ref 11.5–15.5)
WBC: 5.7 10*3/uL (ref 4.0–10.5)
nRBC: 0 % (ref 0.0–0.2)

## 2023-06-01 LAB — SURGICAL PCR SCREEN
MRSA, PCR: NEGATIVE
Staphylococcus aureus: NEGATIVE

## 2023-06-01 LAB — GLUCOSE, CAPILLARY: Glucose-Capillary: 173 mg/dL — ABNORMAL HIGH (ref 70–99)

## 2023-06-01 NOTE — Progress Notes (Addendum)
PCP - Dr.Enobong Newlin  Cardiologist - Dr. Clifton James  EP-no  Endocrine-no  Pulm-no  Chest x-ray - na  EKG - 03/10/2023  Stress Test - 02/20/21  ECHO - no  Cardiac Cath - no  AICD-no PM-no LOOP-no  Nerve Stimulator-no  Dialysis-no  Sleep Study - "many years, does not use CPAP- Stop Bang score is 4.   LABS-CMP- 05/27/23, CBC- 06/01/23  ASA- 81 mg- Ms Liz instructed to call Dr. Eliberto Ivory office and ask.  ERAS-yes = Low Sugar Gatorade HA1C-05/27/23- 7.2  GLP-1-no Fasting Blood Sugar - 120- 170 Checks Blood Sugar ___1__ times a day  Anesthesia- Ms Buffkin has stopped taking Hygroton- causes a sever headache.  Patient did not take any medications this am,  BP was 166/89.  Dr. Alvis Lemmings is aware that Hygroton causes Ms. Shaker headaches., but the note states that patient is willing to endure the side effects in order to have knee surgery done. Pt denies having chest pain, sob, or fever at this time. All instructions explained to the pt, with a verbal understanding of the material. Pt agrees to go over the instructions while at home for a better understanding. The opportunity to ask questions was provided.

## 2023-06-02 ENCOUNTER — Other Ambulatory Visit: Payer: Self-pay

## 2023-06-02 ENCOUNTER — Telehealth: Payer: Self-pay | Admitting: *Deleted

## 2023-06-02 NOTE — Telephone Encounter (Signed)
   Name: Tammy Graham  DOB: 08-17-1951  MRN: [id number]  Primary Cardiologist: Verne Carrow, MD  Chart reviewed as part of pre-operative protocol coverage. Because of [name]'s past medical history and time since last visit, she will require a follow-up in-office visit in order to better assess preoperative cardiovascular risk.  Pre-op covering staff: - Please schedule appointment and call patient to inform them. If patient already had an upcoming appointment within acceptable timeframe, please add "pre-op clearance" to the appointment notes so provider is aware. - Please contact requesting surgeon's office via preferred method (i.e, phone, fax) to inform them of need for appointment prior to surgery.  Patient is on aspirin for history of CAD.  Can hold aspirin 5 to 7 days prior to procedure if asymptomatic at the time of office visit.  [name], PA-C  06/02/2023, 12:19 PM

## 2023-06-02 NOTE — Progress Notes (Addendum)
Anesthesia Review:  PCP: Hoy Register PCp  LOV 05/27/23  Cardiologist : Vasc- McKenzi Schub- LOV 03/11/23  Chest x-ray : EKG :03/10/23  Echo : Stress test: 2022  Cardiac Cath :  Activity level:  Sleep Study/ CPAP : Fasting Blood Sugar :      / Checks Blood Sugar -- times a day:   Blood Thinner/ Instructions /Last Dose: ASA / Instructions/ Last Dose :    Labs of pcr, cbc  done  06/01/23.   CMP done 05/27/23  Hgba1c- done 05/27/23- 7.2.    DM- type 2  -   PT was called on 06/02/23 and made aware surgery will be at [facility] on 05/[facility]23.  Pt aware to arrive at 0515 for 0715am surgery.  Pt aware to follow preop instructons given on 06/01/23 at preop appt and may have clear liquids until 0415am.  PT voiced understanding. Pt was at her job and only had few minutes to talk.

## 2023-06-02 NOTE — Telephone Encounter (Signed)
CORRECTION ON CLEARANCE FORM; DATE OF PROCEDURE: 06/09/23.    I left a message for the pt to call back and schedule an appt in office.

## 2023-06-02 NOTE — Telephone Encounter (Signed)
   Pre-operative Risk Assessment    Patient Name: Tammy Graham  DOB: [birth year] MRN: [id number]   DATE OF LAST VISIT- 03/04/22 WITH DR. Clifton James DATE OF NEXT VISIT- NO FUTURE APPTS SCHED  Request for Surgical Clearance    Procedure:   LEFT TOTAL KNEE ARTHROPLASTY  Date of Surgery:  Clearance TBD                                 Surgeon:  DR. Doneen Poisson Surgeon's Group or Practice Name:  [facility] Phone number:  [phone] Fax number:  [phone] ATTN: SHERRIE   Type of Clearance Requested:   - Medical ; ASA    Type of Anesthesia:  Spinal & BLOCK   Additional requests/questions:    Elpidio Anis   06/02/2023, 12:02 PM

## 2023-06-03 ENCOUNTER — Telehealth: Payer: Self-pay | Admitting: Orthopaedic Surgery

## 2023-06-03 NOTE — Telephone Encounter (Signed)
Patient was returning a call. Please advise  

## 2023-06-03 NOTE — Telephone Encounter (Signed)
LVMFCB to let pt know there the next available appt time is @2 :45 p.m. on Monday at [facility] with Kandyce Rud.

## 2023-06-03 NOTE — Telephone Encounter (Signed)
[place] forms received. To [employer]. 

## 2023-06-03 NOTE — Telephone Encounter (Signed)
Message sent to APP advising on OV availability as her procedure is next Tuesday 8/20 and there's no open slots prior to

## 2023-06-04 NOTE — Telephone Encounter (Signed)
I s/w the pt today and I stated that we have been trying to reach her and that we have left messages. Pt states she has not received any messages. Pt is agreeable to plan of care for in office appt 06/05/23 with Gillian Shields, NP at [facility] location. Pt asked for me to send her the address and the hung up on me.   Pt has MY CHART, I will send her the information through [facility] CHART. I will update all parties involved.

## 2023-06-05 ENCOUNTER — Ambulatory Visit (INDEPENDENT_AMBULATORY_CARE_PROVIDER_SITE_OTHER): Payer: BC Managed Care – PPO | Admitting: Family

## 2023-06-05 ENCOUNTER — Encounter (HOSPITAL_BASED_OUTPATIENT_CLINIC_OR_DEPARTMENT_OTHER): Payer: Self-pay | Admitting: Family

## 2023-06-05 VITALS — BP 140/82 | HR 64 | Ht 66.0 in | Wt 159.0 lb

## 2023-06-05 DIAGNOSIS — I10 Essential (primary) hypertension: Secondary | ICD-10-CM | POA: Diagnosis not present

## 2023-06-05 DIAGNOSIS — I2510 Atherosclerotic heart disease of native coronary artery without angina pectoris: Secondary | ICD-10-CM

## 2023-06-05 DIAGNOSIS — E785 Hyperlipidemia, unspecified: Secondary | ICD-10-CM | POA: Diagnosis not present

## 2023-06-05 DIAGNOSIS — Z01810 Encounter for preprocedural cardiovascular examination: Secondary | ICD-10-CM | POA: Diagnosis not present

## 2023-06-05 NOTE — Patient Instructions (Addendum)
Medication Instructions:  If your surgical team wants, you can hold Aspirin 5-7 days prior to surgery.  *If you need a refill on your cardiac medications before your next appointment, please call your pharmacy*  Testing/Procedures: Your EKG looked good today!  Follow-Up: At [facility], you and your health needs are our priority.  As part of our continuing mission to provide you with exceptional heart care, we have created designated Provider Care Teams.  These Care Teams include your primary Cardiologist (physician) and Advanced Practice Providers (APPs -  Physician Assistants and Nurse Practitioners) who all work together to provide you with the care you need, when you need it.  We recommend signing up for the patient portal called "MyChart".  Sign up information is provided on this After Visit Summary.  MyChart is used to connect with patients for Virtual Visits (Telemedicine).  Patients are able to view lab/test results, encounter notes, upcoming appointments, etc.  Non-urgent messages can be sent to your provider as well.   To learn more about what you can do with MyChart, go to [email or web address].    Your next appointment:   10/17 AT 10:30 WITH Tammy Shields, NP   Other Instructions  We sent a note to Dr. Magnus Ivan that you are good to go for your knee surgery.   Tips to Measure your Blood Pressure Correctly  Check BP once per day and keep a log.  Here's what you can do to ensure a correct reading:  Don't drink a caffeinated beverage or smoke during the 30 minutes before the test.  Sit quietly for five minutes before the test begins.  During the measurement, sit in a chair with your feet on the floor and your arm supported so your elbow is at about heart level.  The inflatable part of the cuff should completely cover at least 80% of your upper arm, and the cuff should be placed on bare skin, not over a shirt.  Don't talk during the measurement.   Blood pressure  categories  Blood pressure category SYSTOLIC (upper number)  DIASTOLIC (lower number)  Normal Less than 120 mm Hg and Less than 80 mm Hg  Elevated 120-129 mm Hg and Less than 80 mm Hg  High blood pressure: Stage 1 hypertension 130-139 mm Hg or 80-89 mm Hg  High blood pressure: Stage 2 hypertension 140 mm Hg or higher or 90 mm Hg or higher  Hypertensive crisis (consult your doctor immediately) Higher than 180 mm Hg and/or Higher than 120 mm Hg  Source: American Heart Association and American Stroke Association. For more on getting your blood pressure under control, buy Controlling Your Blood Pressure, a Special Health Report from [facility].
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
## 2023-06-08 NOTE — H&P (Signed)
TOTAL KNEE ADMISSION H&P  Patient is being admitted for left total knee arthroplasty.  Subjective:  Chief Complaint:left knee pain.  HPI: Tammy Graham, 72 y.o. female, has a history of pain and functional disability in the left knee due to arthritis and has failed non-surgical conservative treatments for greater than 12 weeks to includeNSAID's and/or analgesics, corticosteriod injections, viscosupplementation injections, flexibility and strengthening excercises, and activity modification.  Onset of symptoms was gradual, starting 6 years ago with gradually worsening course since that time. The patient noted no past surgery on the left knee(s).  Patient currently rates pain in the left knee(s) at 10 out of 10 with activity. Patient has night pain, worsening of pain with activity and weight bearing, pain that interferes with activities of daily living, pain with passive range of motion, crepitus, and joint swelling.  Patient has evidence of subchondral cysts, subchondral sclerosis, periarticular osteophytes, and joint space narrowing by imaging studies. There is no active infection.  Patient Active Problem List   Diagnosis Date Noted   Unilateral primary osteoarthritis, left knee 02/12/2022   PAD (peripheral artery disease) (HCC) 02/27/2021   Pain due to onychomycosis of toenails of both feet 07/05/2019   Right flank pain 07/01/2019   Dysuria 01/28/2017   Vaginal itching 01/24/2016   Midline low back pain without sciatica 01/24/2016   Type 2 diabetes mellitus without complication, without long-term current use of insulin (HCC) 12/06/2015   Pap smear for cervical cancer screening 12/06/2015   Dyslipidemia 11/29/2015   Cough 11/01/2015   Hip pain 02/08/2015   Essential hypertension 03/22/2014   Mixed hyperlipidemia 03/22/2014   Tobacco use 03/22/2014   Type 2 diabetes mellitus (HCC) 03/22/2014   Anxiety 03/22/2014   CAD (coronary artery disease) 05/06/2011   Past Medical History:   Diagnosis Date   Anxiety    Arthritis    shoulder and knees   CAD (coronary artery disease)    Complication of anesthesia    states she was still awake one time when surgery started   DM (diabetes mellitus) (HCC)    Family history of adverse reaction to anesthesia    tooks alot to put to sleep, slow to awaken.   HTN (hypertension)    Hyperlipemia    Myocardial infarction [facility])    Peripheral vascular disease (HCC)    Pneumonia    Sleep apnea    no cpap   Stroke (HCC)    2012, no residual    Past Surgical History:  Procedure Laterality Date   ABDOMINAL AORTOGRAM W/LOWER EXTREMITY Bilateral 02/11/2021   Procedure: ABDOMINAL AORTOGRAM W/LOWER EXTREMITY;  Surgeon: Maeola Harman, MD;  Location: [facility] INVASIVE CV LAB;  Service: Cardiovascular;  Laterality: Bilateral;   ABDOMINAL AORTOGRAM W/LOWER EXTREMITY Right 02/09/2023   Procedure: ABDOMINAL AORTOGRAM W/LOWER EXTREMITY;  Surgeon: Maeola Harman, MD;  Location: [facility] INVASIVE CV LAB;  Service: Cardiovascular;  Laterality: Right;   CARDIAC CATHETERIZATION     COLONOSCOPY     COLONOSCOPY  09/25/2011   Procedure: COLONOSCOPY;  Surgeon: Vertell Novak., MD;  Location: [facility];  Service: Endoscopy;  Laterality: N/A;   CORONARY STENT PLACEMENT     Ostial LAD stent in 2012   FEMORAL-POPLITEAL BYPASS GRAFT Right 02/27/2021   Procedure: RIGHT COMMON FEMORAL- ABOVE KNEE POPLITEAL ARTERY BYPASS GRAFT;  Surgeon: Maeola Harman, MD;  Location: [facility] OR;  Service: Vascular;  Laterality: Right;   Left knee surgery     PERIPHERAL VASCULAR INTERVENTION Right 02/09/2023   Procedure: PERIPHERAL  VASCULAR INTERVENTION;  Surgeon: Maeola Harman, MD;  Location: [facility] INVASIVE CV LAB;  Service: Cardiovascular;  Laterality: Right;  RT SFA    No current facility-administered medications for this encounter.   Current Outpatient Medications  Medication Sig Dispense Refill Last Dose   acetaminophen-codeine (TYLENOL #3)  300-30 MG tablet Take 1 tablet by mouth 2 (two) times daily as needed for moderate pain. 60 tablet 2    aspirin EC 81 MG tablet Take 81 mg by mouth in the morning. Swallow whole.      carvedilol (COREG) 12.5 MG tablet TAKE 1 TABLET(12.5 MG) BY MOUTH TWICE DAILY WITH A MEAL 180 tablet 1    chlorthalidone (HYGROTON) 25 MG tablet Take 1 tablet (25 mg total) by mouth daily. 90 tablet 1    FARXIGA 10 MG TABS tablet Take 1 tablet (10 mg total) by mouth daily before breakfast. 90 tablet 1    glipiZIDE (GLUCOTROL XL) 10 MG 24 hr tablet TAKE 1 TABLET(10 MG) BY MOUTH DAILY WITH BREAKFAST* DISCONTINUE IMMEDIATE RELEASE GLIPIZIDE* 90 tablet 1    Multiple Vitamin (MULTIVITAMIN WITH MINERALS) TABS tablet Take 1 tablet by mouth in the morning.      Naphazoline-Pheniramine (VISINE OP) Place 1 drop into both eyes daily.      valsartan (DIOVAN) 320 MG tablet Take 1 tablet (320 mg total) by mouth daily. 90 tablet 1    atorvastatin (LIPITOR) 20 MG tablet Take 1 tablet (20 mg total) by mouth daily. 90 tablet 1    Blood Glucose Monitoring Suppl (ONETOUCH VERIO) w/Device KIT Use to test blood sugar 3 times daily 1 kit 0    fluconazole (DIFLUCAN) 150 MG tablet TAKE 1 TABLET BY MOUTH DAILY (Patient taking differently: Take 150 mg by mouth daily as needed.) 5 tablet 0 Not Taking   glucose blood (ONETOUCH VERIO) test strip Use to test blood sugar 3 times daily 100 each 12    Lancets (ONETOUCH ULTRASOFT) lancets Use to test blood sugar 3 times daily 100 each 12    Misc. Devices MISC Shower bench, Above the toilet seat.  Diagnosis- Type 2 Diabetes Mellitus 1 each 0    Misc. Devices MISC Blood pressure monitor.  Diagnosis hypertension 1 each 0    nicotine (NICODERM CQ) 14 mg/24hr patch Place 1 patch (14 mg total) onto the skin daily. 28 patch 2    Allergies  Allergen Reactions   Chlorthalidone Other (See Comments)    headache   Plavix [Clopidogrel Bisulfate] Itching   Rosuvastatin Hives    Patient is allergic to generic  rosuvastatin. She says she is able to take branded Crestor.    Simvastatin Other (See Comments)    unknown   Benadryl [Diphenhydramine Hcl (Sleep)] Rash    Social History   Tobacco Use   Smoking status: Every Day    Current packs/day: 0.00    Average packs/day: 0.3 packs/day for 15.0 years (3.8 ttl pk-yrs)    Types: Cigarettes    Start date: 02/27/2006    Last attempt to quit: 02/27/2021    Years since quitting: 2.2   Smokeless tobacco: Never   Tobacco comments:    4-5 daily  Substance Use Topics   Alcohol use: Not Currently    Alcohol/week: 1.0 standard drink of alcohol    Types: 1 Standard drinks or equivalent per week    Family History  Problem Relation Age of Onset   Heart attack Mother    Anesthesia problems Neg Hx    Hypotension Neg  Hx    Malignant hyperthermia Neg Hx    Pseudochol deficiency Neg Hx      Review of Systems  Objective:  Physical Exam Vitals reviewed.  Constitutional:      Appearance: Normal appearance. She is normal weight.  HENT:     Head: Normocephalic and atraumatic.  Eyes:     Extraocular Movements: Extraocular movements intact.     Pupils: Pupils are equal, round, and reactive to light.  Cardiovascular:     Rate and Rhythm: Normal rate and regular rhythm.  Pulmonary:     Effort: Pulmonary effort is normal.     Breath sounds: Normal breath sounds.  Abdominal:     Palpations: Abdomen is soft.  Musculoskeletal:     Cervical back: Normal range of motion and neck supple.     Left knee: Effusion, bony tenderness and crepitus present. Decreased range of motion. Tenderness present over the medial joint line and lateral joint line. Abnormal alignment.  Skin:    Capillary Refill: Capillary refill takes more than 3 seconds.  Neurological:     Mental Status: She is alert.  Psychiatric:        Behavior: Behavior normal.     Vital signs in last 24 hours:    Labs:   Estimated body mass index is 25.66 kg/m as calculated from the  following:   Height as of 06/05/23: 5\' 6"  (1.676 m).   Weight as of 06/05/23: 72.1 kg.   Imaging Review Plain radiographs demonstrate severe degenerative joint disease of the left knee(s). The overall alignment ismild varus. The bone quality appears to be good for age and reported activity level.      Assessment/Plan:  End stage arthritis, left knee   The patient history, physical examination, clinical judgment of the provider and imaging studies are consistent with end stage degenerative joint disease of the left knee(s) and total knee arthroplasty is deemed medically necessary. The treatment options including medical management, injection therapy arthroscopy and arthroplasty were discussed at length. The risks and benefits of total knee arthroplasty were presented and reviewed. The risks due to aseptic loosening, infection, stiffness, patella tracking problems, thromboembolic complications and other imponderables were discussed. The patient acknowledged the explanation, agreed to proceed with the plan and consent was signed. Patient is being admitted for inpatient treatment for surgery, pain control, PT, OT, prophylactic antibiotics, VTE prophylaxis, progressive ambulation and ADL's and discharge planning. The patient is planning to be discharged home with home health services

## 2023-06-08 NOTE — Anesthesia Preprocedure Evaluation (Addendum)
Anesthesia Evaluation  Patient identified by MRN, date of birth, ID band Patient awake    Reviewed: Allergy & Precautions, NPO status , Patient's Chart, lab work & pertinent test results  Airway Mallampati: II  TM Distance: >3 FB Neck ROM: Full    Dental  (+) Dental Advisory Given, Edentulous Upper, Edentulous Lower   Pulmonary sleep apnea , Current Smoker and Patient abstained from smoking.   Pulmonary exam normal breath sounds clear to auscultation       Cardiovascular hypertension, Pt. on medications and Pt. on home beta blockers + CAD, + Past MI, + Cardiac Stents and + Peripheral Vascular Disease   Rhythm:Regular Rate:Normal  Stress 02/2021  The left ventricular ejection fraction is hyperdynamic (>65%).  Nuclear stress EF: 72%.  There was no ST segment deviation noted during stress.  No T wave inversion was noted during stress.  The study is normal.  This is a low risk study.   Negative stress test for ischemia or infarction. Low risk study.    Neuro/Psych  PSYCHIATRIC DISORDERS Anxiety     CVA    GI/Hepatic negative GI ROS, Neg liver ROS,,,  Endo/Other  diabetes    Renal/GU negative Renal ROS     Musculoskeletal  (+) Arthritis ,    Abdominal  (+) - obese  Peds  Hematology negative hematology ROS (+)   Anesthesia Other Findings   Reproductive/Obstetrics                             Anesthesia Physical Anesthesia Plan  ASA: 3  Anesthesia Plan: Spinal   Post-op Pain Management: Tylenol PO (pre-op)* and Regional block*   Induction: Intravenous  PONV Risk Score and Plan: 2 and Ondansetron, Treatment may vary due to age or medical condition and Dexamethasone  Airway Management Planned:   Additional Equipment:   Intra-op Plan:   Post-operative Plan:   Informed Consent: I have reviewed the patients History and Physical, chart, labs and discussed the procedure  including the risks, benefits and alternatives for the proposed anesthesia with the patient or authorized representative who has indicated his/her understanding and acceptance.     Dental advisory given  Plan Discussed with: CRNA  Anesthesia Plan Comments: (See PAT note 06/01/2023)        Anesthesia Quick Evaluation

## 2023-06-08 NOTE — Progress Notes (Addendum)
Anesthesia Chart Review   Case: [id number] Date/Time: 06/09/23 0715   Procedure: LEFT TOTAL KNEE ARTHROPLASTY (Left: Knee)   Anesthesia type: Spinal   Pre-op diagnosis: osteoarthritis left knee   [facility] / [facility]   Surgeons: Kathryne Hitch, MD       DISCUSSION:72 y.o. smoker with h/o HTN, PVD, CAD s/p NSTEMI 2012 with DES-ostium of LAD with mild disease of RCA, right SFA to above-knee popliteal artery bypass with greater saphenous vein on 02/27/2021, underwent stenting of the midportion of her right SFA to above-knee popliteal artery bypass on 02/09/2023, CVA, DM, left knee OA scheduled for above procedure 06/09/2023 with Dr. Doneen Poisson.   Pt seen by cardiology 06/05/2023. Per OV note, "Preop - Pending knee surgery. According to the Revised Cardiac Risk Index (RCRI), her Perioperative Risk of Major Cardiac Event is (%): 6.6. Her Functional Capacity in METs is: 5.07 according to the [facility] Activity Status Index (DASI). Per AHA/ACC guidelines, she is deemed acceptable risk for the planned procedure without additional cardiovascular testing. Will route to surgical team so they are aware.  My hold Aspirin 5-7 days prior to surgery if deemed necessary. "  Pt last seen by vascular surgeon 03/11/2023. Per OV note, "Unfortunately she has severe arthritis in her left knee which is keeping her from being able to work.  She is requiring clearance from our office so she can undergo knee replacement surgery soon.  She is no longer taking her Plavix due to itchiness.  She does not need to resume her Plavix.  She should continue taking her aspirin and statin.  She is cleared from a vascular standpoint for knee replacement surgery."  VS: BP (!) 166/89   Pulse 66   Temp 36.9 C   Resp 16   Ht 5\' 6"  (1.676 m)   Wt 72.5 kg   SpO2 100%   BMI 25.81 kg/m   PROVIDERS: Hoy Register, MD is PCP   Cardiologist:  Verne Carrow, MD    LABS: Labs reviewed: Acceptable for  surgery. (all labs ordered are listed, but only abnormal results are displayed)  Labs Reviewed  GLUCOSE, CAPILLARY - Abnormal; Notable for the following components:      Result Value   Glucose-Capillary 173 (*)    All other components within normal limits  CBC - Abnormal; Notable for the following components:   Hemoglobin 15.3 (*)    All other components within normal limits  SURGICAL PCR SCREEN     IMAGES:   EKG:   CV: Myocardial Perfusion 02/20/2021 The left ventricular ejection fraction is hyperdynamic (>65%). Nuclear stress EF: 72%. There was no ST segment deviation noted during stress. No T wave inversion was noted during stress. The study is normal. This is a low risk study.   Negative stress test for ischemia or infarction. Low risk study. Past Medical History:  Diagnosis Date   Anxiety    Arthritis    shoulder and knees   CAD (coronary artery disease)    Complication of anesthesia    states she was still awake one time when surgery started   DM (diabetes mellitus) (HCC)    Family history of adverse reaction to anesthesia    tooks alot to put to sleep, slow to awaken.   HTN (hypertension)    Hyperlipemia    Myocardial infarction [facility])    Peripheral vascular disease (HCC)    Pneumonia    Sleep apnea    no cpap   Stroke (HCC)  2012, no residual    Past Surgical History:  Procedure Laterality Date   ABDOMINAL AORTOGRAM W/LOWER EXTREMITY Bilateral 02/11/2021   Procedure: ABDOMINAL AORTOGRAM W/LOWER EXTREMITY;  Surgeon: Maeola Harman, MD;  [facility] INVASIVE CV LAB;  Service: Cardiovascular;  Laterality: Bilateral;   ABDOMINAL AORTOGRAM W/LOWER EXTREMITY Right 02/09/2023   Procedure: ABDOMINAL AORTOGRAM W/LOWER EXTREMITY;  Surgeon: Maeola Harman, MD;  Location: [facility] INVASIVE CV LAB;  Service: Cardiovascular;  Laterality: Right;   CARDIAC CATHETERIZATION     COLONOSCOPY     COLONOSCOPY  09/25/2011   Procedure: COLONOSCOPY;   Surgeon: Vertell Novak., MD;  [facility];  Service: Endoscopy;  Laterality: N/A;   CORONARY STENT PLACEMENT     Ostial LAD stent in 2012   FEMORAL-POPLITEAL BYPASS GRAFT Right 02/27/2021   Procedure: RIGHT COMMON FEMORAL- ABOVE KNEE POPLITEAL ARTERY BYPASS GRAFT;  Surgeon: Maeola Harman, MD;  Location: [facility] OR;  Service: Vascular;  Laterality: Right;   Left knee surgery     PERIPHERAL VASCULAR INTERVENTION Right 02/09/2023   Procedure: PERIPHERAL VASCULAR INTERVENTION;  Surgeon: Maeola Harman, MD;  Location: [facility] INVASIVE CV LAB;  Service: Cardiovascular;  Laterality: Right;  RT SFA    MEDICATIONS:  acetaminophen-codeine (TYLENOL #3) 300-30 MG tablet   aspirin EC 81 MG tablet   atorvastatin (LIPITOR) 20 MG tablet   Blood Glucose Monitoring Suppl (ONETOUCH VERIO) w/Device KIT   carvedilol (COREG) 12.5 MG tablet   chlorthalidone (HYGROTON) 25 MG tablet   FARXIGA 10 MG TABS tablet   fluconazole (DIFLUCAN) 150 MG tablet   glipiZIDE (GLUCOTROL XL) 10 MG 24 hr tablet   glucose blood (ONETOUCH VERIO) test strip   Lancets (ONETOUCH ULTRASOFT) lancets   Misc. Devices MISC   Misc. Devices MISC   Multiple Vitamin (MULTIVITAMIN WITH MINERALS) TABS tablet   Naphazoline-Pheniramine (VISINE OP)   nicotine (NICODERM CQ) 14 mg/24hr patch   valsartan (DIOVAN) 320 MG tablet   No current facility-administered medications for this encounter.    Jodell Cipro Ward, PA-C [facility] [phone]

## 2023-06-09 ENCOUNTER — Ambulatory Visit (HOSPITAL_COMMUNITY): Payer: BC Managed Care – PPO | Admitting: Physician Assistant

## 2023-06-09 ENCOUNTER — Other Ambulatory Visit: Payer: Self-pay

## 2023-06-09 ENCOUNTER — Encounter (HOSPITAL_COMMUNITY): Payer: Self-pay | Admitting: Orthopaedic Surgery

## 2023-06-09 ENCOUNTER — Observation Stay (HOSPITAL_COMMUNITY): Payer: BC Managed Care – PPO

## 2023-06-09 ENCOUNTER — Observation Stay (HOSPITAL_COMMUNITY)
Admission: RE | Admit: 2023-06-09 | Discharge: 2023-06-15 | Disposition: A | Discharge status: Home Health | Payer: BC Managed Care – PPO | Attending: Orthopaedic Surgery | Admitting: Orthopaedic Surgery

## 2023-06-09 ENCOUNTER — Ambulatory Visit (HOSPITAL_COMMUNITY): Payer: BC Managed Care – PPO | Admitting: Anesthesiology

## 2023-06-09 ENCOUNTER — Encounter (HOSPITAL_COMMUNITY)
Admission: RE | Disposition: A | Discharge status: Home Health | Payer: Self-pay | Source: Home / Self Care | Attending: Orthopaedic Surgery

## 2023-06-09 DIAGNOSIS — Z01818 Encounter for other preprocedural examination: Secondary | ICD-10-CM

## 2023-06-09 DIAGNOSIS — E119 Type 2 diabetes mellitus without complications: Secondary | ICD-10-CM | POA: Insufficient documentation

## 2023-06-09 DIAGNOSIS — Z8673 Personal history of transient ischemic attack (TIA), and cerebral infarction without residual deficits: Secondary | ICD-10-CM | POA: Diagnosis not present

## 2023-06-09 DIAGNOSIS — Z96652 Presence of left artificial knee joint: Secondary | ICD-10-CM

## 2023-06-09 DIAGNOSIS — I2510 Atherosclerotic heart disease of native coronary artery without angina pectoris: Secondary | ICD-10-CM | POA: Insufficient documentation

## 2023-06-09 DIAGNOSIS — Z7982 Long term (current) use of aspirin: Secondary | ICD-10-CM | POA: Insufficient documentation

## 2023-06-09 DIAGNOSIS — Z79899 Other long term (current) drug therapy: Secondary | ICD-10-CM | POA: Insufficient documentation

## 2023-06-09 DIAGNOSIS — I10 Essential (primary) hypertension: Secondary | ICD-10-CM | POA: Insufficient documentation

## 2023-06-09 DIAGNOSIS — Z955 Presence of coronary angioplasty implant and graft: Secondary | ICD-10-CM | POA: Insufficient documentation

## 2023-06-09 DIAGNOSIS — Z7984 Long term (current) use of oral hypoglycemic drugs: Secondary | ICD-10-CM | POA: Diagnosis not present

## 2023-06-09 DIAGNOSIS — M1712 Unilateral primary osteoarthritis, left knee: Principal | ICD-10-CM | POA: Diagnosis present

## 2023-06-09 DIAGNOSIS — R609 Edema, unspecified: Secondary | ICD-10-CM | POA: Diagnosis not present

## 2023-06-09 DIAGNOSIS — E782 Mixed hyperlipidemia: Secondary | ICD-10-CM | POA: Diagnosis not present

## 2023-06-09 DIAGNOSIS — Z471 Aftercare following joint replacement surgery: Secondary | ICD-10-CM | POA: Diagnosis not present

## 2023-06-09 DIAGNOSIS — F17210 Nicotine dependence, cigarettes, uncomplicated: Secondary | ICD-10-CM | POA: Insufficient documentation

## 2023-06-09 DIAGNOSIS — G8918 Other acute postprocedural pain: Secondary | ICD-10-CM | POA: Diagnosis not present

## 2023-06-09 HISTORY — PX: TOTAL KNEE ARTHROPLASTY: SHX125

## 2023-06-09 LAB — GLUCOSE, CAPILLARY
Glucose-Capillary: 132 mg/dL — ABNORMAL HIGH (ref 70–99)
Glucose-Capillary: 152 mg/dL — ABNORMAL HIGH (ref 70–99)

## 2023-06-09 SURGERY — ARTHROPLASTY, KNEE, TOTAL
Anesthesia: Spinal | Site: Knee | Laterality: Left

## 2023-06-09 MED ORDER — ONDANSETRON HCL 4 MG/2ML IJ SOLN: 4.0000 mg | Freq: Four times a day (QID) | INTRAMUSCULAR | Status: DC | PRN

## 2023-06-09 MED ORDER — OXYCODONE HCL 5 MG PO TABS
10.0000 mg | ORAL_TABLET | ORAL | Status: DC | PRN
  Administered 2023-06-09 – 2023-06-12 (×10): 15 mg via ORAL
  Administered 2023-06-12: 10 mg via ORAL
  Administered 2023-06-12 – 2023-06-15 (×10): 15 mg via ORAL
  Filled 2023-06-09 (×23): qty 3

## 2023-06-09 MED ORDER — MENTHOL 3 MG MT LOZG: 1.0000 | LOZENGE | OROMUCOSAL | Status: DC | PRN

## 2023-06-09 MED ORDER — ALUM & MAG HYDROXIDE-SIMETH 200-200-20 MG/5ML PO SUSP: 30.0000 mL | ORAL | Status: DC | PRN

## 2023-06-09 MED ORDER — SODIUM CHLORIDE 0.9 % IR SOLN
Status: DC | PRN
  Administered 2023-06-09: 1000 mL

## 2023-06-09 MED ORDER — METHOCARBAMOL 500 MG PO TABS
500.0000 mg | ORAL_TABLET | Freq: Four times a day (QID) | ORAL | Status: DC | PRN
  Administered 2023-06-10 – 2023-06-15 (×11): 500 mg via ORAL
  Filled 2023-06-09 (×12): qty 1

## 2023-06-09 MED ORDER — BUPIVACAINE-EPINEPHRINE 0.25% -1:200000 IJ SOLN
INTRAMUSCULAR | Status: DC | PRN
  Administered 2023-06-09: 30 mL

## 2023-06-09 MED ORDER — ONDANSETRON HCL 4 MG/2ML IJ SOLN
INTRAMUSCULAR | Status: AC
  Filled 2023-06-09: qty 2

## 2023-06-09 MED ORDER — 0.9 % SODIUM CHLORIDE (POUR BTL) OPTIME
TOPICAL | Status: DC | PRN
  Administered 2023-06-09: 1000 mL

## 2023-06-09 MED ORDER — POVIDONE-IODINE 10 % EX SWAB: 2.0000 | Freq: Once | CUTANEOUS | Status: DC

## 2023-06-09 MED ORDER — CEFAZOLIN SODIUM-DEXTROSE 2-4 GM/100ML-% IV SOLN
2.0000 g | INTRAVENOUS | Status: AC
  Administered 2023-06-09: 2 g via INTRAVENOUS
  Filled 2023-06-09: qty 100

## 2023-06-09 MED ORDER — CEFAZOLIN SODIUM-DEXTROSE 1-4 GM/50ML-% IV SOLN
1.0000 g | Freq: Four times a day (QID) | INTRAVENOUS | Status: AC
  Administered 2023-06-09 (×2): 1 g via INTRAVENOUS
  Filled 2023-06-09 (×2): qty 50

## 2023-06-09 MED ORDER — NICOTINE 14 MG/24HR TD PT24
14.0000 mg | MEDICATED_PATCH | Freq: Every day | TRANSDERMAL | Status: DC
  Administered 2023-06-09 – 2023-06-15 (×7): 14 mg via TRANSDERMAL
  Filled 2023-06-09 (×7): qty 1

## 2023-06-09 MED ORDER — PHENOL 1.4 % MT LIQD: 1.0000 | OROMUCOSAL | Status: DC | PRN

## 2023-06-09 MED ORDER — DOCUSATE SODIUM 100 MG PO CAPS
100.0000 mg | ORAL_CAPSULE | Freq: Two times a day (BID) | ORAL | Status: DC
  Administered 2023-06-09 – 2023-06-15 (×12): 100 mg via ORAL
  Filled 2023-06-09 (×12): qty 1

## 2023-06-09 MED ORDER — IRBESARTAN 150 MG PO TABS
300.0000 mg | ORAL_TABLET | Freq: Every day | ORAL | Status: DC
  Administered 2023-06-09 – 2023-06-15 (×7): 300 mg via ORAL
  Filled 2023-06-09 (×7): qty 2

## 2023-06-09 MED ORDER — METOCLOPRAMIDE HCL 5 MG PO TABS: 5.0000 mg | ORAL_TABLET | Freq: Three times a day (TID) | ORAL | Status: DC | PRN

## 2023-06-09 MED ORDER — ACETAMINOPHEN 500 MG PO TABS
1000.0000 mg | ORAL_TABLET | Freq: Once | ORAL | Status: AC
  Administered 2023-06-09: 1000 mg via ORAL
  Filled 2023-06-09: qty 2

## 2023-06-09 MED ORDER — HYDROMORPHONE HCL 1 MG/ML IJ SOLN
0.5000 mg | INTRAMUSCULAR | Status: DC | PRN
  Administered 2023-06-09: 1 mg via INTRAVENOUS
  Administered 2023-06-09: 0.5 mg via INTRAVENOUS
  Filled 2023-06-09 (×2): qty 1

## 2023-06-09 MED ORDER — METOCLOPRAMIDE HCL 5 MG/ML IJ SOLN: 5.0000 mg | Freq: Three times a day (TID) | INTRAMUSCULAR | Status: DC | PRN

## 2023-06-09 MED ORDER — DEXAMETHASONE SODIUM PHOSPHATE 10 MG/ML IJ SOLN
INTRAMUSCULAR | Status: AC
  Filled 2023-06-09: qty 1

## 2023-06-09 MED ORDER — CLONIDINE HCL (ANALGESIA) 100 MCG/ML EP SOLN
EPIDURAL | Status: DC | PRN
Start: 2023-06-09 — End: 2023-06-09
  Administered 2023-06-09: 80 ug

## 2023-06-09 MED ORDER — ATORVASTATIN CALCIUM 20 MG PO TABS
20.0000 mg | ORAL_TABLET | Freq: Every day | ORAL | Status: DC
  Administered 2023-06-10 – 2023-06-15 (×6): 20 mg via ORAL
  Filled 2023-06-09 (×6): qty 1

## 2023-06-09 MED ORDER — DEXAMETHASONE SODIUM PHOSPHATE 4 MG/ML IJ SOLN
INTRAMUSCULAR | Status: DC | PRN
  Administered 2023-06-09: 5 mg via PERINEURAL

## 2023-06-09 MED ORDER — GLYCOPYRROLATE 0.2 MG/ML IJ SOLN
INTRAMUSCULAR | Status: AC
  Filled 2023-06-09: qty 1

## 2023-06-09 MED ORDER — ONDANSETRON HCL 4 MG/2ML IJ SOLN
INTRAMUSCULAR | Status: DC | PRN
Start: 2023-06-09 — End: 2023-06-09
  Administered 2023-06-09: 4 mg via INTRAVENOUS

## 2023-06-09 MED ORDER — MIDAZOLAM HCL 5 MG/5ML IJ SOLN
INTRAMUSCULAR | Status: DC | PRN
  Administered 2023-06-09: 1 mg via INTRAVENOUS

## 2023-06-09 MED ORDER — MIDAZOLAM HCL 2 MG/2ML IJ SOLN
INTRAMUSCULAR | Status: AC
  Filled 2023-06-09: qty 2

## 2023-06-09 MED ORDER — CHLORTHALIDONE 25 MG PO TABS
25.0000 mg | ORAL_TABLET | Freq: Every day | ORAL | Status: DC
  Administered 2023-06-09 – 2023-06-15 (×7): 25 mg via ORAL
  Filled 2023-06-09 (×7): qty 1

## 2023-06-09 MED ORDER — CARVEDILOL 12.5 MG PO TABS
12.5000 mg | ORAL_TABLET | Freq: Two times a day (BID) | ORAL | Status: DC
  Administered 2023-06-09 – 2023-06-15 (×12): 12.5 mg via ORAL
  Filled 2023-06-09 (×12): qty 1

## 2023-06-09 MED ORDER — PROPOFOL 1000 MG/100ML IV EMUL
INTRAVENOUS | Status: AC
  Filled 2023-06-09: qty 100

## 2023-06-09 MED ORDER — PROPOFOL 10 MG/ML IV BOLUS
INTRAVENOUS | Status: DC | PRN
  Administered 2023-06-09: 80 ug/kg/min via INTRAVENOUS
  Administered 2023-06-09 (×2): 10 mg via INTRAVENOUS

## 2023-06-09 MED ORDER — GLIPIZIDE ER 5 MG PO TB24
10.0000 mg | ORAL_TABLET | Freq: Every day | ORAL | Status: DC
  Administered 2023-06-10 – 2023-06-15 (×6): 10 mg via ORAL
  Filled 2023-06-09 (×6): qty 2

## 2023-06-09 MED ORDER — DAPAGLIFLOZIN PROPANEDIOL 10 MG PO TABS
10.0000 mg | ORAL_TABLET | Freq: Every day | ORAL | Status: DC
  Administered 2023-06-10 – 2023-06-15 (×6): 10 mg via ORAL
  Filled 2023-06-09 (×6): qty 1

## 2023-06-09 MED ORDER — BUPIVACAINE IN DEXTROSE 0.75-8.25 % IT SOLN
INTRATHECAL | Status: DC | PRN
  Administered 2023-06-09: 1.6 mL via INTRATHECAL

## 2023-06-09 MED ORDER — ASPIRIN 81 MG PO CHEW
81.0000 mg | CHEWABLE_TABLET | Freq: Two times a day (BID) | ORAL | Status: DC
  Administered 2023-06-09 – 2023-06-15 (×12): 81 mg via ORAL
  Filled 2023-06-09 (×12): qty 1

## 2023-06-09 MED ORDER — METHOCARBAMOL 1000 MG/10ML IJ SOLN: 500.0000 mg | Freq: Four times a day (QID) | INTRAVENOUS | Status: DC | PRN

## 2023-06-09 MED ORDER — FENTANYL CITRATE (PF) 100 MCG/2ML IJ SOLN
INTRAMUSCULAR | Status: DC | PRN
  Administered 2023-06-09: 25 ug via INTRAVENOUS
  Administered 2023-06-09: 50 ug via INTRAVENOUS

## 2023-06-09 MED ORDER — BUPIVACAINE-EPINEPHRINE 0.25% -1:200000 IJ SOLN
INTRAMUSCULAR | Status: AC
  Filled 2023-06-09: qty 1

## 2023-06-09 MED ORDER — ACETAMINOPHEN 325 MG PO TABS
325.0000 mg | ORAL_TABLET | Freq: Four times a day (QID) | ORAL | Status: DC | PRN
  Administered 2023-06-10: 650 mg via ORAL
  Filled 2023-06-09: qty 2

## 2023-06-09 MED ORDER — ROPIVACAINE HCL 5 MG/ML IJ SOLN
INTRAMUSCULAR | Status: DC | PRN
  Administered 2023-06-09: 30 mL via PERINEURAL

## 2023-06-09 MED ORDER — TRANEXAMIC ACID-NACL 1000-0.7 MG/100ML-% IV SOLN
1000.0000 mg | INTRAVENOUS | Status: AC
  Administered 2023-06-09: 1000 mg via INTRAVENOUS
  Filled 2023-06-09: qty 100

## 2023-06-09 MED ORDER — ORAL CARE MOUTH RINSE: 15.0000 mL | OROMUCOSAL | Status: DC | PRN

## 2023-06-09 MED ORDER — FENTANYL CITRATE (PF) 100 MCG/2ML IJ SOLN
INTRAMUSCULAR | Status: AC
  Filled 2023-06-09: qty 2

## 2023-06-09 MED ORDER — DROPERIDOL 2.5 MG/ML IJ SOLN: 0.6250 mg | Freq: Once | INTRAMUSCULAR | Status: DC | PRN

## 2023-06-09 MED ORDER — CHLORHEXIDINE GLUCONATE 0.12 % MT SOLN
15.0000 mL | Freq: Once | OROMUCOSAL | Status: AC
  Administered 2023-06-09: 15 mL via OROMUCOSAL

## 2023-06-09 MED ORDER — OXYCODONE HCL 5 MG PO TABS
5.0000 mg | ORAL_TABLET | ORAL | Status: DC | PRN
  Administered 2023-06-09: 5 mg via ORAL
  Administered 2023-06-15: 10 mg via ORAL
  Filled 2023-06-09: qty 1
  Filled 2023-06-09: qty 2
  Filled 2023-06-09: qty 1

## 2023-06-09 MED ORDER — STERILE WATER FOR IRRIGATION IR SOLN
Status: DC | PRN
  Administered 2023-06-09: 2000 mL

## 2023-06-09 MED ORDER — SODIUM CHLORIDE 0.9 % IV SOLN: INTRAVENOUS | Status: DC

## 2023-06-09 MED ORDER — PANTOPRAZOLE SODIUM 40 MG PO TBEC
40.0000 mg | DELAYED_RELEASE_TABLET | Freq: Every day | ORAL | Status: DC
  Administered 2023-06-09 – 2023-06-15 (×7): 40 mg via ORAL
  Filled 2023-06-09 (×7): qty 1

## 2023-06-09 MED ORDER — LACTATED RINGERS IV SOLN: INTRAVENOUS | Status: DC

## 2023-06-09 MED ORDER — ONDANSETRON HCL 4 MG PO TABS: 4.0000 mg | ORAL_TABLET | Freq: Four times a day (QID) | ORAL | Status: DC | PRN

## 2023-06-09 MED ORDER — HYDROMORPHONE HCL 1 MG/ML IJ SOLN: 0.2500 mg | INTRAMUSCULAR | Status: DC | PRN

## 2023-06-09 SURGICAL SUPPLY — 62 items
APL SKNCLS STERI-STRIP NONHPOA (GAUZE/BANDAGES/DRESSINGS)
ARTISURF 14M VE L 6-9 EF KNEE (Knees) IMPLANT
BAG COUNTER SPONGE SURGICOUNT (BAG) IMPLANT
BAG SPEC THK2 15X12 ZIP CLS (MISCELLANEOUS) ×1
BAG SPNG CNTER NS LX DISP (BAG)
BAG ZIPLOCK 12X15 (MISCELLANEOUS) ×1 IMPLANT
BASEPLATE CMT PS KNEE F 0D LT (Joint) IMPLANT
BENZOIN TINCTURE PRP APPL 2/3 (GAUZE/BANDAGES/DRESSINGS) IMPLANT
BLADE SAG 18X100X1.27 (BLADE) ×1 IMPLANT
BLADE SAW SGTL 13.0X1.19X90.0M (BLADE) IMPLANT
BLADE SURG SZ10 CARB STEEL (BLADE) ×2 IMPLANT
BNDG CMPR 6 X 5 YARDS HK CLSR (GAUZE/BANDAGES/DRESSINGS) ×2
BNDG ELASTIC 6INX 5YD STR LF (GAUZE/BANDAGES/DRESSINGS) ×2 IMPLANT
BOWL SMART MIX CTS (DISPOSABLE) IMPLANT
CEMENT BONE R 1X40 (Cement) IMPLANT
CEMENT BONE SIMPLEX SPEEDSET (Cement) IMPLANT
COOLER ICEMAN CLASSIC (MISCELLANEOUS) ×1 IMPLANT
COVER SURGICAL LIGHT HANDLE (MISCELLANEOUS) ×1 IMPLANT
CUFF TOURN SGL QUICK 34 (TOURNIQUET CUFF) ×1
CUFF TRNQT CYL 34X4.125X (TOURNIQUET CUFF) ×1 IMPLANT
DRAPE INCISE IOBAN 66X45 STRL (DRAPES) ×1 IMPLANT
DRAPE U-SHAPE 47X51 STRL (DRAPES) ×1 IMPLANT
DURAPREP 26ML APPLICATOR (WOUND CARE) ×1 IMPLANT
ELECT BLADE TIP CTD 4 INCH (ELECTRODE) ×1 IMPLANT
ELECT REM PT RETURN 15FT ADLT (MISCELLANEOUS) ×1 IMPLANT
FEMUR CMT CCR STD SZ7 L KNEE (Knees) ×1 IMPLANT
FEMUR CMTD CCR STD SZ7 L KNEE (Knees) IMPLANT
GAUZE PAD ABD 8X10 STRL (GAUZE/BANDAGES/DRESSINGS) ×2 IMPLANT
GAUZE SPONGE 4X4 12PLY STRL (GAUZE/BANDAGES/DRESSINGS) ×1 IMPLANT
GAUZE XEROFORM 1X8 LF (GAUZE/BANDAGES/DRESSINGS) IMPLANT
GLOVE BIO SURGEON STRL SZ7.5 (GLOVE) ×1 IMPLANT
GLOVE BIOGEL PI IND STRL 8 (GLOVE) ×2 IMPLANT
GLOVE ECLIPSE 8.0 STRL XLNG CF (GLOVE) ×1 IMPLANT
GOWN STRL REUS W/ TWL XL LVL3 (GOWN DISPOSABLE) ×2 IMPLANT
GOWN STRL REUS W/TWL XL LVL3 (GOWN DISPOSABLE) ×2
HANDPIECE INTERPULSE COAX TIP (DISPOSABLE) ×1
HOLDER FOLEY CATH W/STRAP (MISCELLANEOUS) IMPLANT
IMMOBILIZER KNEE 20 (SOFTGOODS) ×1
IMMOBILIZER KNEE 20 THIGH 36 (SOFTGOODS) ×1 IMPLANT
KIT TURNOVER KIT A (KITS) IMPLANT
NS IRRIG 1000ML POUR BTL (IV SOLUTION) ×1 IMPLANT
PACK TOTAL KNEE CUSTOM (KITS) ×1 IMPLANT
PAD COLD SHLDR WRAP-ON (PAD) ×1 IMPLANT
PADDING CAST COTTON 6X4 STRL (CAST SUPPLIES) ×2 IMPLANT
PIN DRILL HDLS TROCAR 75 4PK (PIN) IMPLANT
PROTECTOR NERVE ULNAR (MISCELLANEOUS) ×1 IMPLANT
SCREW FEMALE HEX FIX 25X2.5 (ORTHOPEDIC DISPOSABLE SUPPLIES) IMPLANT
SET HNDPC FAN SPRY TIP SCT (DISPOSABLE) ×1 IMPLANT
SET PAD KNEE POSITIONER (MISCELLANEOUS) ×1 IMPLANT
SPIKE FLUID TRANSFER (MISCELLANEOUS) IMPLANT
STAPLER VISISTAT 35W (STAPLE) IMPLANT
STEM POLY PAT PLY 29M KNEE (Knees) IMPLANT
STRIP CLOSURE SKIN 1/2X4 (GAUZE/BANDAGES/DRESSINGS) IMPLANT
SUT MNCRL AB 4-0 PS2 18 (SUTURE) IMPLANT
SUT VIC AB 0 CT1 27 (SUTURE) ×1
SUT VIC AB 0 CT1 27XBRD ANTBC (SUTURE) ×1 IMPLANT
SUT VIC AB 1 CT1 36 (SUTURE) ×2 IMPLANT
SUT VIC AB 2-0 CT1 27 (SUTURE) ×2
SUT VIC AB 2-0 CT1 TAPERPNT 27 (SUTURE) ×2 IMPLANT
TRAY FOLEY MTR SLVR 14FR STAT (SET/KITS/TRAYS/PACK) IMPLANT
TRAY FOLEY MTR SLVR 16FR STAT (SET/KITS/TRAYS/PACK) IMPLANT
WATER STERILE IRR 1000ML POUR (IV SOLUTION) ×2 IMPLANT

## 2023-06-09 NOTE — Op Note (Signed)
Operative Note  Date of operation: 06/09/2023 Preoperative diagnosis: Left knee primary osteoarthritis Postoperative diagnosis: Same  Procedure: Left cemented total knee arthroplasty  Implants: Biomet/Zimmer persona PS cemented knee system Implant Name Type Inv. Item Serial No. Manufacturer Lot No. LRB No. Used Action  CEMENT BONE R 1X40 - [id number] Cement CEMENT BONE R 1X40  ZIMMER RECON(ORTH,TRAU,BIO,SG) [id number] Left 1 Implanted  CEMENT BONE R 1X40 - [id number] Cement CEMENT BONE R 1X40  ZIMMER RECON(ORTH,TRAU,BIO,SG) [id number] Left 1 Implanted  STEM POLY PAT PLY 82M KNEE - [id number] Knees STEM POLY PAT PLY 82M KNEE  ZIMMER RECON(ORTH,TRAU,BIO,SG) [id number] Left 1 Implanted  ARTISURF 58M VE L 6-9 EF KNEE - [id number] Knees ARTISURF 58M VE L 6-9 EF KNEE  ZIMMER RECON(ORTH,TRAU,BIO,SG) [id number] Left 1 Implanted  FEMUR CMT CCR STD SZ7 L KNEE - [id number] Knees FEMUR CMT CCR STD SZ7 L KNEE  ZIMMER RECON(ORTH,TRAU,BIO,SG) [id number] Left 1 Implanted  BASEPLATE CMT PS KNEE F 0D LT - [id number] Joint BASEPLATE CMT PS KNEE F 0D LT  ZIMMER RECON(ORTH,TRAU,BIO,SG) [id number] Left 1 Implanted   Surgeon: Vanita Panda. Magnus Ivan, MD Assistant: Rexene Edison, PA-C  Anesthesia: #1 left lower extremity adductor canal block, #2 spinal, #3 local EBL: Less than 100 cc Tourniquet time: Less than 1 hour Antibiotics: 2 g IV Ancef Complications: None  Indications: The patient is a 72 year old active female with severe debilitating arthritis involving her left knee.  I have seen her for many years now.  She has tried and failed conservative treatment and at this point her left knee pain is daily and it is detrimentally affecting her mobility, her quality of life and her actives daily living.  She has a significant flexion contracture and varus malalignment of that knee and the arthritis is severe.  We did talk about the risk of acute blood loss anemia, nerve or vessel injury, fracture, infection, DVT, implant  failure and wound healing issues.  She understands her goals are hopefully decrease pain, improve mobility, and improve quality of life.  Procedure description: After informed consent was obtained and the appropriate left knee was marked, an adductor canal block was obtained of the left lower extremity in the holding room.  The patient was then brought to the operating room and sat up on the operating table and spinal anesthesia was obtained.  She was laid in supine position on the operating table and a Foley catheter was placed.  A nonsterile tractors placed around her upper left thigh and her left thigh, knee, leg and ankle were prepped and draped with DuraPrep and sterile drapes including a sterile stockinette.  A timeout was called and she was then divided the correct patient and the correct left knee.  An Esmarch was used to wrap out the leg and the tourniquet was inflated to 300 mm of pressure.  With the knee extended a direct midline incision was made over the patella and carried proximally distally.  Dissection was carried down to the knee joint and a medial parapatellar arthrotomy was made finding a very large joint effusion.  There was significant wear in all 3 compartments of the knee with bone-on-bone wear.  We removed remnants of the ACL PCL medial and lateral meniscus.  Using an extramedullary cutting guide we made our proximal tibia cut correction for varus and valgus and a 7 degree slope.  We made this cut without difficulty.  We then went to the femur and made our distal femoral cut based on an alignment guide there was intramedullary for a  left knee at 5 degrees externally rotated and a 10 mm distal femoral cut.  We then brought the knee back down to full extension and she actually been slightly hyperextended with a 10 mm block.  We then back to the femur and put a 4-in-1 cutting block based off the epicondylar axis.  Based off of this we chose a size 7 femur.  With better 4-in-1 cutting block  and made our anterior posterior cuts followed our chamfer cuts.  Given her deformity which said to go to the PS knees we did a femoral box cut.  We then backed the tibia and chose a size F left tibial tray for coverage over the tibial plateau setting the rotation of the tibial tubercle and the femur.  We made our keel punch and drill hole off of this.  We then trialed our size F left tibial plate followed by our size 7 left PS standard femur.  We placed a 10 mm PS polyethylene insert and went up to 14 mm..  We are pleased with range of motion and stability of the 14 mm insert.  We then made our patella cut and drilled 3 holes for a size 29 mm patella button.  We then put the knee through several cycles of motion with all instrumentation the knee.  We then removed all transportation and irrigate the knee with normal saline solution using pulsatile lavage.  We dried the knee roll well and placed our Marcaine with epinephrine around the arthrotomy.  The cement was then mixed off the back table.  With the knee flexed we then cemented our Biomet Zimmer persona tibial tray for a left knee size F followed by cementing our size 7 left PS standard femur.  We placed our 14 mm thickness PS polyethylene insert and cemented our size 29 patella button.  We then held the knee fully extended and compressed while the cement hardened.  Once it hardened we put the knee through motion again and we are pleased with range of motion and stability.  The tourniquet was let down and hemostasis was obtained with electrocautery.  The arthrotomy was closed with interrupted #1 Vicryl suture followed by 0 Vicryl close deep tissue and 2-0 Vicryl to close subcutaneous tissue.  The skin was closed with staples.  Well-padded sterile dressings applied.  The patient was taken the recovery room in stable condition.  Rexene Edison, PA-C did assist during entire case and beginning to end and his assistance of medical necessary and crucial for soft tissue  management and retraction, helping guide implant placement and a layered closure of the wound.

## 2023-06-09 NOTE — Progress Notes (Signed)
Patient c/o moderate pain to mid sternal chest. Was unable to describe the pain, would just say "it hurts," but did specify that this pain does not feel like the MI she had a couple of years ago. VS taken, stable, EKG taken, noted sinus bradycardia. Patient states she believes the pain is related to having missed her am medications. She also reports feeling extremely hungry. Scheduled meds administered, meal tray in front of patient. Pain completely resolved after eating. Will cont to monitor.

## 2023-06-09 NOTE — Progress Notes (Signed)
PT Cancellation Note  Patient Details Name: Tammy Graham MRN: [id number] DOB: 03/30/1951   Cancelled Treatment:    Reason Eval/Treat Not Completed:  Attempted PT eval POD 0, pt declined to participate with therapy at this time-pt stated she was tired, too many people coming in one after the other. Encouraged her to try to sit up with nursing later this evening. Will reattempt PT eval on tomorrow morning.    Faye Ramsay, PT Acute Rehabilitation  Office: [phone]

## 2023-06-09 NOTE — Transfer of Care (Signed)
Immediate Anesthesia Transfer of Care Note  Patient: Tammy Graham  Procedure(s) Performed: LEFT TOTAL KNEE ARTHROPLASTY (Left: Knee)  Patient Location: PACU  Anesthesia Type:Spinal  Level of Consciousness: drowsy and patient cooperative  Airway & Oxygen Therapy: Patient Spontanous Breathing and Patient connected to face mask oxygen  Post-op Assessment: Report given to RN and Post -op Vital signs reviewed and stable  Post vital signs: Reviewed and stable  Last Vitals:  Vitals Value Taken Time  BP 127/79 06/09/23 0928  Temp    Pulse 63 06/09/23 0930  Resp 19 06/09/23 0930  SpO2 98 % 06/09/23 0930  Vitals shown include unfiled device data.  Last Pain:  Vitals:   06/09/23 0610  TempSrc: Oral  PainSc:          Complications: No notable events documented.

## 2023-06-09 NOTE — Interval H&P Note (Signed)
History and Physical Interval Note: The patient is here today for a left total knee replacement to treat her severe left knee arthritis.  There has been no acute or interval change in her medical status.  The risks and benefits of surgery been discussed in detail and informed consent has been obtained.  The left operative knee has been marked.  06/09/2023 6:59 AM  Tammy Graham  has presented today for surgery, with the diagnosis of osteoarthritis left knee.  The various methods of treatment have been discussed with the patient and family. After consideration of risks, benefits and other options for treatment, the patient has consented to  Procedure(s): LEFT TOTAL KNEE ARTHROPLASTY (Left) as a surgical intervention.  The patient's history has been reviewed, patient examined, no change in status, stable for surgery.  I have reviewed the patient's chart and labs.  Questions were answered to the patient's satisfaction.     Kathryne Hitch

## 2023-06-09 NOTE — Anesthesia Procedure Notes (Signed)
Spinal  Patient location during procedure: OR Start time: 06/09/2023 7:35 AM End time: 06/09/2023 7:39 AM Reason for block: surgical anesthesia Staffing Performed: anesthesiologist  Anesthesiologist: Lewie Loron, MD Performed by: Lewie Loron, MD Authorized by: Lewie Loron, MD   Preanesthetic Checklist Completed: patient identified, IV checked, site marked, risks and benefits discussed, surgical consent, monitors and equipment checked, pre-op evaluation and timeout performed Spinal Block Patient position: sitting Prep: DuraPrep and site prepped and draped Patient monitoring: heart rate, continuous pulse ox and blood pressure Approach: left paramedian Location: L3-4 Injection technique: single-shot Needle Needle type: Spinocan  Needle gauge: 25 G Needle length: 9 cm Additional Notes Expiration date of kit checked and confirmed. Patient tolerated procedure well, without complications.

## 2023-06-09 NOTE — Anesthesia Procedure Notes (Signed)
Anesthesia Regional Block: Adductor canal block   Pre-Anesthetic Checklist: , timeout performed,  Correct Patient, Correct Site, Correct Laterality,  Correct Procedure, Correct Position, site marked,  Risks and benefits discussed,  Surgical consent,  Pre-op evaluation,  At surgeon's request and post-op pain management  Laterality: Lower and Left  Prep: chloraprep       Needles:  Injection technique: Single-shot  Needle Type: Stimiplex     Needle Length: 9cm  Needle Gauge: 21     Additional Needles:   Procedures:,,,, ultrasound used (permanent image in chart),,    Narrative:  Start time: 06/09/2023 7:01 AM End time: 06/09/2023 7:21 AM Injection made incrementally with aspirations every 5 mL.  Performed by: Personally  Anesthesiologist: Lewie Loron, MD  Additional Notes: BP cuff, EKG monitors applied. Sedation begun. Artery and nerve location verified with ultrasound. Anesthetic injected incrementally (5ml), slowly, and after negative aspirations under direct u/s guidance. Good fascial/perineural spread. Tolerated well.

## 2023-06-10 ENCOUNTER — Encounter (HOSPITAL_COMMUNITY): Payer: Self-pay | Admitting: Orthopaedic Surgery

## 2023-06-10 DIAGNOSIS — Z79899 Other long term (current) drug therapy: Secondary | ICD-10-CM | POA: Diagnosis not present

## 2023-06-10 DIAGNOSIS — F17210 Nicotine dependence, cigarettes, uncomplicated: Secondary | ICD-10-CM | POA: Diagnosis not present

## 2023-06-10 DIAGNOSIS — I10 Essential (primary) hypertension: Secondary | ICD-10-CM | POA: Diagnosis not present

## 2023-06-10 DIAGNOSIS — Z7984 Long term (current) use of oral hypoglycemic drugs: Secondary | ICD-10-CM | POA: Diagnosis not present

## 2023-06-10 DIAGNOSIS — M1712 Unilateral primary osteoarthritis, left knee: Secondary | ICD-10-CM | POA: Diagnosis not present

## 2023-06-10 DIAGNOSIS — Z7982 Long term (current) use of aspirin: Secondary | ICD-10-CM | POA: Diagnosis not present

## 2023-06-10 DIAGNOSIS — E119 Type 2 diabetes mellitus without complications: Secondary | ICD-10-CM | POA: Diagnosis not present

## 2023-06-10 DIAGNOSIS — I2510 Atherosclerotic heart disease of native coronary artery without angina pectoris: Secondary | ICD-10-CM | POA: Diagnosis not present

## 2023-06-10 DIAGNOSIS — Z8673 Personal history of transient ischemic attack (TIA), and cerebral infarction without residual deficits: Secondary | ICD-10-CM | POA: Diagnosis not present

## 2023-06-10 DIAGNOSIS — Z955 Presence of coronary angioplasty implant and graft: Secondary | ICD-10-CM | POA: Diagnosis not present

## 2023-06-10 LAB — CBC
HCT: 41.8 % (ref 36.0–46.0)
Hemoglobin: 14 g/dL (ref 12.0–15.0)
MCH: 32.9 pg (ref 26.0–34.0)
MCHC: 33.5 g/dL (ref 30.0–36.0)
MCV: 98.1 fL (ref 80.0–100.0)
Platelets: 179 10*3/uL (ref 150–400)
RBC: 4.26 MIL/uL (ref 3.87–5.11)
RDW: 13.4 % (ref 11.5–15.5)
WBC: 11.4 10*3/uL — ABNORMAL HIGH (ref 4.0–10.5)
nRBC: 0 % (ref 0.0–0.2)

## 2023-06-10 LAB — BASIC METABOLIC PANEL
Anion gap: 8 (ref 5–15)
BUN: 15 mg/dL (ref 8–23)
CO2: 26 mmol/L (ref 22–32)
Calcium: 9.2 mg/dL (ref 8.9–10.3)
Chloride: 103 mmol/L (ref 98–111)
Creatinine, Ser: 1.02 mg/dL — ABNORMAL HIGH (ref 0.44–1.00)
GFR, Estimated: 58 mL/min — ABNORMAL LOW (ref >60)
Glucose, Bld: 155 mg/dL — ABNORMAL HIGH (ref 70–99)
Potassium: 4 mmol/L (ref 3.5–5.1)
Sodium: 137 mmol/L (ref 135–145)

## 2023-06-10 MED ORDER — KETOROLAC TROMETHAMINE 15 MG/ML IJ SOLN
7.5000 mg | Freq: Four times a day (QID) | INTRAMUSCULAR | Status: AC
  Administered 2023-06-10 (×3): 7.5 mg via INTRAVENOUS
  Filled 2023-06-10 (×3): qty 1

## 2023-06-10 NOTE — TOC Initial Note (Signed)
Transition of Care [facility]) - Initial/Assessment Note    Patient Details  Name: Tammy Graham MRN: [id number] Date of Birth: 05-10-1951  Transition of Care [facility]) CM/SW Contact:    Amada Jupiter, LCSW Phone Number: 06/10/2023, 3:20 PM  Clinical Narrative:                  Met with pt and reviewed dc planning needs.  Pt is concerned that she does not have needed assistance in the home and may need short term SNF.  Per PT eval, recommendation made for SNF.  Will begin SNF bed search.  Expected Discharge Plan: Skilled Nursing Facility Barriers to Discharge: SNF Pending bed offer, Insurance Authorization   Patient Goals and CMS Choice Patient states their goals for this hospitalization and ongoing recovery are:: return home after SNF rehab CMS Medicare.gov Compare Post Acute Care list provided to:: Patient Choice offered to / list presented to : Patient      Expected Discharge Plan and Services In-house Referral: Clinical Social Work   Post Acute Care Choice: Skilled Nursing Facility Living arrangements for the past 2 months: Single Family Home                 DME Arranged: N/A DME Agency: NA                  Prior Living Arrangements/Services Living arrangements for the past 2 months: Single Family Home Lives with:: Self Patient language and need for interpreter reviewed:: Yes Do you feel safe going back to the place where you live?: Yes      Need for Family Participation in Patient Care: Yes (Comment) Care giver support system in place?: No (comment)   Criminal Activity/Legal Involvement Pertinent to Current Situation/Hospitalization: No - Comment as needed  Activities of Daily Living Home Assistive Devices/Equipment: Walker (specify type), Eyeglasses, Dentures (specify type), Bedside commode/3-in-1 ADL Screening (condition at time of admission) Patient's cognitive ability adequate to safely complete daily activities?: Yes Is the patient deaf or have difficulty  hearing?: No Does the patient have difficulty seeing, even when wearing glasses/contacts?: No Does the patient have difficulty concentrating, remembering, or making decisions?: No Patient able to express need for assistance with ADLs?: Yes Does the patient have difficulty dressing or bathing?: No Independently performs ADLs?: Yes (appropriate for developmental age) Does the patient have difficulty walking or climbing stairs?: No Weakness of Legs: Left Weakness of Arms/Hands: None  Permission Sought/Granted Permission sought to share information with : Family Supports, Oceanographer granted to share information with : Yes, Verbal Permission Granted  Share Information with NAME: Martie Lee Claggion @ [phone]  Permission granted to share info w AGENCY: SNFs        Emotional Assessment Appearance:: Appears stated age Attitude/Demeanor/Rapport: Engaged, Gracious Affect (typically observed): Accepting Orientation: : Oriented to Self, Oriented to Place, Oriented to  Time, Oriented to Situation Alcohol / Substance Use: Not Applicable Psych Involvement: No (comment)  Admission diagnosis:  Status post total left knee replacement [Z96.652] Patient Active Problem List   Diagnosis Date Noted   Status post total left knee replacement 06/09/2023   Unilateral primary osteoarthritis, left knee 02/12/2022   PAD (peripheral artery disease) (HCC) 02/27/2021   Pain due to onychomycosis of toenails of both feet 07/05/2019   Right flank pain 07/01/2019   Dysuria 01/28/2017   Vaginal itching 01/24/2016   Midline low back pain without sciatica 01/24/2016   Type 2 diabetes mellitus without complication, without long-term current use  of insulin (HCC) 12/06/2015   Pap smear for cervical cancer screening 12/06/2015   Dyslipidemia 11/29/2015   Cough 11/01/2015   Hip pain 02/08/2015   Essential hypertension 03/22/2014   Mixed hyperlipidemia 03/22/2014   Tobacco use  03/22/2014   Type 2 diabetes mellitus (HCC) 03/22/2014   Anxiety 03/22/2014   CAD (coronary artery disease) 05/06/2011   PCP:  Hoy Register, MD Pharmacy:   [facility] [phone] - [place], [place] - [name] AT [facility] [name] [place] [address] Phone: [phone] Fax: [phone]  [facility] [address] [place] [address] Phone: [phone] Fax: [phone]  [id number] - [place], [place] - [address] AT [facility] OF [address] [place] [address] Phone: [phone] Fax: [phone]  [facility] - [facility] [address] [place] [address] Phone: [phone] Fax: [phone]  [id number] - [place], [place] - [address] [place] [address] Phone: [phone] Fax: [phone]     Social Determinants of Health (SDOH) Social History: SDOH Screenings   Food Insecurity: No Food Insecurity (06/09/2023)  Housing: Low Risk  (06/09/2023)  Transportation Needs: No Transportation Needs (06/09/2023)  Utilities: Not At Risk (06/09/2023)  Depression (PHQ2-9): Low Risk  (05/27/2023)  Social Connections: Unknown (03/18/2023)   Received from [facility], [facility]  Tobacco Use: High Risk (06/09/2023)   SDOH Interventions:     Readmission Risk Interventions    03/06/2021    1:59 PM  Readmission Risk Prevention Plan  Post Dischage Appt Complete  Medication Screening Complete  Transportation Screening Complete

## 2023-06-10 NOTE — Discharge Instructions (Signed)

## 2023-06-10 NOTE — Progress Notes (Signed)
Physical Therapy Treatment Patient Details Name: Tammy Graham MRN: [id number] DOB: Feb 20, 1951 Today's Date: 06/10/2023   History of Present Illness Pt is a 72 y/o F admitted on 06/09/23 for scheduled L TKA. PMH: PAD, DM2, dyslipidemia, essential HTN, tobacco use, anxiety, CAD    PT Comments  Pt seen for PT tx with pt received asleep in bed, requiring extra time to awaken & max encouragement for participation. Pt eventually agreeable to participating when she voiced need to urgently void. Not enough time to [facility] & reassess SLR so utilized KI for session. Pt ambulates in room/bathroom with RW & CGA. Pt requires cuing for hand placement during STS with grab bar & cuing to then hold to RW as pt attempting to walk to sink with LUE still remaining on grab bar. Pt ambulates into hallway with RW & CGA with standing rest breaks 2/2 pain. Pt progressing from walking on L forefoot to foot flat despite cuing for increased dorsiflexion & heel strike. Continue to recommend ongoing PT services to progress gait with LRAD, stair negotiation, safety awareness, strengthening & balance as pt lives home alone.    If plan is discharge home, recommend the following: A little help with walking and/or transfers;A little help with bathing/dressing/bathroom;Assistance with cooking/housework;Assist for transportation;Help with stairs or ramp for entrance   Can travel by private vehicle        Equipment Recommendations  Rolling walker (2 wheels)    Recommendations for Other Services       Precautions / Restrictions Precautions Precautions: Fall Restrictions Weight Bearing Restrictions: Yes LLE Weight Bearing: Weight bearing as tolerated     Mobility  Bed Mobility Overal bed mobility: Needs Assistance Bed Mobility: Supine to Sit     Supine to sit: Supervision, HOB elevated, Used rails     General bed mobility comments: extra time    Transfers Overall transfer level: Needs assistance Equipment used:  Rolling walker (2 wheels) Transfers: Sit to/from Stand Sit to Stand: Contact guard assist           General transfer comment: STS from EOB, toilet, cuing to use grab bars when transferring on/off toilet    Ambulation/Gait Ambulation/Gait assistance: Contact guard assist Gait Distance (Feet): 140 Feet (3 standing rest breaks 2/2 pain) Assistive device: Rolling walker (2 wheels) Gait Pattern/deviations: Decreased step length - right, Decreased step length - left, Decreased dorsiflexion - right, Decreased dorsiflexion - left, Decreased stride length Gait velocity: decreased     General Gait Details: Pt initially ambulating on L forefoot with cuing for increased dorsiflexion & heel strike, pt able to progress from ambulating on forefoot to flat foot but still no heel strike.   Stairs             Wheelchair Mobility     Tilt Bed    Modified Rankin (Stroke Patients Only)       Balance Overall balance assessment: Needs assistance Sitting-balance support: Feet supported Sitting balance-Leahy Scale: Fair     Standing balance support: During functional activity, Bilateral upper extremity supported, Reliant on assistive device for balance Standing balance-Leahy Scale: Fair                          Pt with continent void on toilet but pt soiled with urine 2/2 incontinence in bed. PT assisted pt with donning clean gown.    Cognition Arousal: Alert Behavior During Therapy: WFL for tasks assessed/performed Overall Cognitive Status: Within Functional Limits for  tasks assessed                                          Exercises Total Joint Exercises Ankle Circles/Pumps: AROM, Supine, Left, 10 reps Quad Sets: AROM, Supine, Strengthening, Left, 10 reps Towel Squeeze: AROM, Supine, Strengthening, 10 reps (hip adduction squeezes) Short Arc Quad: AAROM, Supine, Strengthening, Left, 10 reps Heel Slides: Supine, Strengthening, AAROM, Left, 10  reps Hip ABduction/ADduction: Supine, AAROM, Strengthening, 10 reps, Left Straight Leg Raises: AAROM, Supine, Strengthening, Left, 10 reps Long Arc Quad: AROM, Seated, Strengthening, Left, 5 reps    General Comments        Pertinent Vitals/Pain Pain Assessment Pain Assessment: 0-10 Pain Score: 10-Worst pain ever Faces Pain Scale: Hurts even more Pain Location: L knee with movement/gait Pain Descriptors / Indicators: Grimacing, Guarding, Discomfort Pain Intervention(s): Limited activity within patient's tolerance, Repositioned, Monitored during session, Patient requesting pain meds-RN notified    Home Living                          Prior Function            PT Goals (current goals can now be found in the care plan section) Acute Rehab PT Goals Patient Stated Goal: to get better, return to work PT Goal Formulation: With patient Time For Goal Achievement: 06/24/23 Potential to Achieve Goals: Good Progress towards PT goals: Progressing toward goals    Frequency    7X/week      PT Plan      Co-evaluation              AM-PAC PT "6 Clicks" Mobility   Outcome Measure  Help needed turning from your back to your side while in a flat bed without using bedrails?: None Help needed moving from lying on your back to sitting on the side of a flat bed without using bedrails?: None Help needed moving to and from a bed to a chair (including a wheelchair)?: A Little Help needed standing up from a chair using your arms (e.g., wheelchair or bedside chair)?: A Little Help needed to walk in hospital room?: A Little Help needed climbing 3-5 steps with a railing? : A Lot 6 Click Score: 19    End of Session Equipment Utilized During Treatment: Gait belt;Left knee immobilizer Activity Tolerance: Patient tolerated treatment well Patient left: in chair;with call bell/phone within reach;with nursing/sitter in room Nurse Communication: Mobility status PT Visit Diagnosis:  Muscle weakness (generalized) (M62.81);Pain;Difficulty in walking, not elsewhere classified (R26.2);Other abnormalities of gait and mobility (R26.89) Pain - Right/Left: Left Pain - part of body: Knee     Time: 1454-1510 PT Time Calculation (min) (ACUTE ONLY): 16 min  Charges:    $Therapeutic Activity: 8-22 mins PT General Charges $$ ACUTE PT VISIT: 1 Visit                     Aleda Grana, PT, DPT 06/10/23, 3:20 PM   Sandi Mariscal 06/10/2023, 3:18 PM

## 2023-06-10 NOTE — Evaluation (Signed)
Physical Therapy Evaluation Patient Details Name: Tammy Graham MRN: [id number] DOB: [birth year] Today's Date: 06/10/2023  History of Present Illness  Pt is a 72 y/o F admitted on 06/09/23 for scheduled L TKA. PMH: PAD, DM2, dyslipidemia, essential HTN, tobacco use, anxiety, CAD  Clinical Impression  Pt seen for PT evaluation with pt agreeable to tx. Prior to admission pt was independent without AD, driving, working 2 jobs. PT provided pt with HEP handout & pt performed LLE strengthening/ROM exercises with cuing for technique, AAROM PRN. Pt requires CGA for bed mobility, min<>mod assist for STS from EOB, and CGA<>min assist for gait with RW. Utilized L KI for gait to support L knee & reduce chance of buckling - PT educated pt on need to use KI at this time. Pt mobilizes well despite L knee weakness. Will continue to follow pt acutely to progress mobility as able.        If plan is discharge home, recommend the following: A little help with walking and/or transfers;A little help with bathing/dressing/bathroom;Assistance with cooking/housework;Assist for transportation;Help with stairs or ramp for entrance   Can travel by private vehicle        Equipment Recommendations Rolling walker (2 wheels)  Recommendations for Other Services       Functional Status Assessment Patient has had a recent decline in their functional status and demonstrates the ability to make significant improvements in function in a reasonable and predictable amount of time.     Precautions / Restrictions Precautions Precautions: Fall Restrictions Weight Bearing Restrictions: Yes LLE Weight Bearing: Weight bearing as tolerated      Mobility  Bed Mobility Overal bed mobility: Needs Assistance Bed Mobility: Supine to Sit     Supine to sit: Contact guard, HOB elevated, Used rails     General bed mobility comments: extra time    Transfers Overall transfer level: Needs assistance Equipment used: Rolling  walker (2 wheels) Transfers: Sit to/from Stand Sit to Stand: Mod assist, Min assist           General transfer comment: STS from EOB with RW, cuing for technique, hand placement, extra time to power up to standing    Ambulation/Gait Ambulation/Gait assistance: Min assist, Contact guard assist Gait Distance (Feet): 120 Feet Assistive device: Rolling walker (2 wheels) Gait Pattern/deviations: Decreased step length - right, Decreased step length - left, Decreased dorsiflexion - right, Decreased dorsiflexion - left, Decreased stride length Gait velocity: decreased     General Gait Details: educated pt on positioning of pt/RW  Stairs            Wheelchair Mobility     Tilt Bed    Modified Rankin (Stroke Patients Only)       Balance Overall balance assessment: Needs assistance Sitting-balance support: Feet supported Sitting balance-Leahy Scale: Fair     Standing balance support: During functional activity, Bilateral upper extremity supported, Reliant on assistive device for balance Standing balance-Leahy Scale: Fair                               Pertinent Vitals/Pain Pain Assessment Pain Assessment: Faces Faces Pain Scale: Hurts even more Pain Location: L knee with movement/gait Pain Descriptors / Indicators: Grimacing, Guarding, Discomfort Pain Intervention(s): Monitored during session, Limited activity within patient's tolerance, Repositioned    Home Living Family/patient expects to be discharged to:: Private residence Living Arrangements: Alone Available Help at Discharge: Family;Friend(s);Neighbor;Available PRN/intermittently Type of Home: Apartment Home Access:  Stairs to enter Entrance Stairs-Rails: Right Entrance Stairs-Number of Steps: 7   Home Layout: One level Home Equipment: None      Prior Function Prior Level of Function : Independent/Modified Independent;Working/employed;Driving             Mobility Comments: Ambulatory  without AD, working at [employer], denies falls in the past 6 months.       Extremity/Trunk Assessment   Upper Extremity Assessment Upper Extremity Assessment: Overall WFL for tasks assessed    Lower Extremity Assessment Lower Extremity Assessment: Generalized weakness;LLE deficits/detail LLE Deficits / Details: 1/5 knee extension sitting EOB       Communication   Communication Communication: No apparent difficulties  Cognition Arousal: Alert Behavior During Therapy: WFL for tasks assessed/performed Overall Cognitive Status: Within Functional Limits for tasks assessed                                          General Comments General comments (skin integrity, edema, etc.): Pt on room air during session, SPO2 >90% before & after gait.    Exercises Total Joint Exercises Ankle Circles/Pumps: AROM, Supine, Left, 10 reps Quad Sets: AROM, Supine, Strengthening, Left, 10 reps Towel Squeeze: AROM, Supine, Strengthening, 10 reps (hip adduction squeezes) Short Arc Quad: AAROM, Supine, Strengthening, Left, 10 reps Heel Slides: Supine, Strengthening, AAROM, Left, 10 reps Hip ABduction/ADduction: Supine, AAROM, Strengthening, 10 reps, Left Straight Leg Raises: AAROM, Supine, Strengthening, Left, 10 reps Long Arc Quad: AROM, Seated, Strengthening, Left, 5 reps   Assessment/Plan    PT Assessment Patient needs continued PT services  PT Problem List Decreased strength;Pain;Decreased activity tolerance;Decreased range of motion;Decreased balance;Decreased mobility;Decreased knowledge of precautions;Decreased safety awareness;Decreased knowledge of use of DME       PT Treatment Interventions DME instruction;Balance training;Gait training;Neuromuscular re-education;Stair training;Functional mobility training;Patient/family education;Therapeutic activities;Therapeutic exercise;Manual techniques;Modalities    PT Goals (Current goals can be found in the Care Plan  section)  Acute Rehab PT Goals Patient Stated Goal: to get better, return to work PT Goal Formulation: With patient Time For Goal Achievement: 06/24/23 Potential to Achieve Goals: Good    Frequency 7X/week     Co-evaluation               AM-PAC PT "6 Clicks" Mobility  Outcome Measure Help needed turning from your back to your side while in a flat bed without using bedrails?: None Help needed moving from lying on your back to sitting on the side of a flat bed without using bedrails?: A Little Help needed moving to and from a bed to a chair (including a wheelchair)?: A Little Help needed standing up from a chair using your arms (e.g., wheelchair or bedside chair)?: A Little Help needed to walk in hospital room?: A Little Help needed climbing 3-5 steps with a railing? : A Lot 6 Click Score: 18    End of Session Equipment Utilized During Treatment: Gait belt;Left knee immobilizer Activity Tolerance: Patient tolerated treatment well Patient left: in chair;with call bell/phone within reach Nurse Communication: Mobility status PT Visit Diagnosis: Muscle weakness (generalized) (M62.81);Pain;Difficulty in walking, not elsewhere classified (R26.2);Other abnormalities of gait and mobility (R26.89) Pain - Right/Left: Left Pain - part of body: Knee    Time: 2952-8413 PT Time Calculation (min) (ACUTE ONLY): 43 min   Charges:   PT Evaluation $PT Eval Low Complexity: 1 Low PT Treatments $Therapeutic Exercise: 8-22  mins PT General Charges $$ ACUTE PT VISIT: 1 Visit         Aleda Grana, PT, DPT 06/10/23, 10:41 AM   Sandi Mariscal 06/10/2023, 10:39 AM

## 2023-06-10 NOTE — Progress Notes (Signed)
Subjective: 1 Day Post-Op Procedure(s) (LRB): LEFT TOTAL KNEE ARTHROPLASTY (Left) Patient reports pain as moderate.    Objective: Vital signs in last 24 hours: Temp:  [97.7 F (36.5 C)-98.5 F (36.9 C)] 98.5 F (36.9 C) (08/21 0615) Pulse Rate:  [49-70] 67 (08/21 0615) Resp:  [15-20] 17 (08/21 0615) BP: (123-160)/(71-84) 138/71 (08/21 0615) SpO2:  [92 %-100 %] 95 % (08/21 0615)  Intake/Output from previous day: 08/20 0701 - 08/21 0700 In: 3436.3 [P.O.:480; I.V.:2656.3; IV Piggyback:300] Out: 4355 [Urine:4345; Blood:10] Intake/Output this shift: Total I/O In: -  Out: 300 [Urine:300]  Recent Labs    06/10/23 0351  HGB 14.0   Recent Labs    06/10/23 0351  WBC 11.4*  RBC 4.26  HCT 41.8  PLT 179   Recent Labs    06/10/23 0351  NA 137  K 4.0  CL 103  CO2 26  BUN 15  CREATININE 1.02*  GLUCOSE 155*  CALCIUM 9.2   No results for input(s): "LABPT", "INR" in the last 72 hours.  Sensation intact distally Intact pulses distally Dorsiflexion/Plantar flexion intact Incision: dressing C/D/I Compartment soft   Assessment/Plan: 1 Day Post-Op Procedure(s) (LRB): LEFT TOTAL KNEE ARTHROPLASTY (Left) Up with therapy  Will need to consult with transitional care team/social work to look into short-term skilled nursing facility placement for this patient.  Apparently she has no family that can assist her and no one at home.  She lives alone.  With that being said, we will need to change her likely to an inpatient admission later today.    Tammy Graham 06/10/2023, 7:37 AM

## 2023-06-10 NOTE — NC FL2 (Signed)
[place] MEDICAID FL2 LEVEL OF CARE FORM     IDENTIFICATION  Patient Name: Tammy Graham Birthdate: [birth year] Sex: female Admission Date (Current Location): 06/09/2023  [facility] and [place] Number:  Producer, television/film/video and Address:  [facility],  [address], [place] [address]      Provider Number: [id number]  Attending Physician Name and Address:  Kathryne Hitch  Relative Name and Phone Number:  daughter, Martie Lee Claggion @ [phone]    Current Level of Care: Hospital Recommended Level of Care: Skilled Nursing Facility Prior Approval Number:    Date Approved/Denied:   PASRR Number: [id number]  Discharge Plan: SNF    Current Diagnoses: Patient Active Problem List   Diagnosis Date Noted   Status post total left knee replacement 06/09/2023   Unilateral primary osteoarthritis, left knee 02/12/2022   PAD (peripheral artery disease) (HCC) 02/27/2021   Pain due to onychomycosis of toenails of both feet 07/05/2019   Right flank pain 07/01/2019   Dysuria 01/28/2017   Vaginal itching 01/24/2016   Midline low back pain without sciatica 01/24/2016   Type 2 diabetes mellitus without complication, without long-term current use of insulin (HCC) 12/06/2015   Pap smear for cervical cancer screening 12/06/2015   Dyslipidemia 11/29/2015   Cough 11/01/2015   Hip pain 02/08/2015   Essential hypertension 03/22/2014   Mixed hyperlipidemia 03/22/2014   Tobacco use 03/22/2014   Type 2 diabetes mellitus (HCC) 03/22/2014   Anxiety 03/22/2014   CAD (coronary artery disease) 05/06/2011    Orientation RESPIRATION BLADDER Height & Weight     Self, Time, Situation, Place  Normal Continent Weight: 159 lb (72.1 kg) Height:  5\' 6"  (167.6 cm)  BEHAVIORAL SYMPTOMS/MOOD NEUROLOGICAL BOWEL NUTRITION STATUS      Continent Diet (regular)  AMBULATORY STATUS COMMUNICATION OF NEEDS Skin   Limited Assist Verbally Other (Comment) (sugical incision only)                        Personal Care Assistance Level of Assistance  Bathing, Dressing Bathing Assistance: Limited assistance   Dressing Assistance: Limited assistance     Functional Limitations Info  Sight, Hearing, Speech Sight Info: Adequate Hearing Info: Adequate Speech Info: Adequate    SPECIAL CARE FACTORS FREQUENCY  PT (By licensed PT), OT (By licensed OT)     PT Frequency: 5x/wk OT Frequency: 5x/wk            Contractures Contractures Info: Not present    Additional Factors Info  Code Status, Allergies Code Status Info: Full Allergies Info: Chlorthalidone, Plavix (Clopidogrel Bisulfate), Rosuvastatin, Simvastatin, Benadryl (Diphenhydramine Hcl (Sleep))           Current Medications (06/10/2023):  This is the current hospital active medication list Current Facility-Administered Medications  Medication Dose Route Frequency Provider Last Rate Last Admin   0.9 %  sodium chloride infusion   Intravenous Continuous Kathryne Hitch, MD   Stopped at 06/10/23 0957   acetaminophen (TYLENOL) tablet 325-650 mg  325-650 mg Oral Q6H PRN Kathryne Hitch, MD       alum & mag hydroxide-simeth (MAALOX/MYLANTA) 200-200-20 MG/5ML suspension 30 mL  30 mL Oral Q4H PRN Kathryne Hitch, MD       aspirin chewable tablet 81 mg  81 mg Oral BID Kathryne Hitch, MD   81 mg at 06/10/23 0839   atorvastatin (LIPITOR) tablet 20 mg  20 mg Oral Daily Kathryne Hitch, MD   20 mg  at 06/10/23 1020   carvedilol (COREG) tablet 12.5 mg  12.5 mg Oral BID WC Kathryne Hitch, MD   12.5 mg at 06/10/23 2841   chlorthalidone (HYGROTON) tablet 25 mg  25 mg Oral Daily Kathryne Hitch, MD   25 mg at 06/10/23 1020   dapagliflozin propanediol (FARXIGA) tablet 10 mg  10 mg Oral QAC breakfast Kathryne Hitch, MD   10 mg at 06/10/23 3244   docusate sodium (COLACE) capsule 100 mg  100 mg Oral BID Kathryne Hitch, MD   100 mg at 06/10/23 1020    glipiZIDE (GLUCOTROL XL) 24 hr tablet 10 mg  10 mg Oral Q breakfast Kathryne Hitch, MD   10 mg at 06/10/23 0102   HYDROmorphone (DILAUDID) injection 0.5-1 mg  0.5-1 mg Intravenous Q4H PRN Kathryne Hitch, MD   1 mg at 06/09/23 2126   irbesartan (AVAPRO) tablet 300 mg  300 mg Oral Daily Kathryne Hitch, MD   300 mg at 06/10/23 1019   ketorolac (TORADOL) 15 MG/ML injection 7.5 mg  7.5 mg Intravenous Q6H Kathryne Hitch, MD   7.5 mg at 06/10/23 1208   menthol-cetylpyridinium (CEPACOL) lozenge 3 mg  1 lozenge Oral PRN Kathryne Hitch, MD       Or   phenol (CHLORASEPTIC) mouth spray 1 spray  1 spray Mouth/Throat PRN Kathryne Hitch, MD       methocarbamol (ROBAXIN) tablet 500 mg  500 mg Oral Q6H PRN Kathryne Hitch, MD   500 mg at 06/10/23 0106   Or   methocarbamol (ROBAXIN) 500 mg in dextrose 5 % 50 mL IVPB  500 mg Intravenous Q6H PRN Kathryne Hitch, MD       metoCLOPramide (REGLAN) tablet 5-10 mg  5-10 mg Oral Q8H PRN Kathryne Hitch, MD       Or   metoCLOPramide (REGLAN) injection 5-10 mg  5-10 mg Intravenous Q8H PRN Kathryne Hitch, MD       nicotine (NICODERM CQ - dosed in mg/24 hours) patch 14 mg  14 mg Transdermal Daily Kathryne Hitch, MD   14 mg at 06/10/23 1020   ondansetron (ZOFRAN) tablet 4 mg  4 mg Oral Q6H PRN Kathryne Hitch, MD       Or   ondansetron [facility]) injection 4 mg  4 mg Intravenous Q6H PRN Kathryne Hitch, MD       Oral care mouth rinse  15 mL Mouth Rinse PRN Kathryne Hitch, MD       oxyCODONE (Oxy IR/ROXICODONE) immediate release tablet 10-15 mg  10-15 mg Oral Q4H PRN Kathryne Hitch, MD   15 mg at 06/10/23 1510   oxyCODONE (Oxy IR/ROXICODONE) immediate release tablet 5-10 mg  5-10 mg Oral Q4H PRN Kathryne Hitch, MD   5 mg at 06/09/23 1521   pantoprazole (PROTONIX) EC tablet 40 mg  40 mg Oral Daily Kathryne Hitch, MD   40 mg at 06/10/23  1020     Discharge Medications: Please see discharge summary for a list of discharge medications.  Relevant Imaging Results:  Relevant Lab Results:   Additional Information SS# [id number]  Amada Jupiter, LCSW

## 2023-06-11 DIAGNOSIS — E119 Type 2 diabetes mellitus without complications: Secondary | ICD-10-CM | POA: Diagnosis not present

## 2023-06-11 DIAGNOSIS — Z7982 Long term (current) use of aspirin: Secondary | ICD-10-CM | POA: Diagnosis not present

## 2023-06-11 DIAGNOSIS — Z7984 Long term (current) use of oral hypoglycemic drugs: Secondary | ICD-10-CM | POA: Diagnosis not present

## 2023-06-11 DIAGNOSIS — Z8673 Personal history of transient ischemic attack (TIA), and cerebral infarction without residual deficits: Secondary | ICD-10-CM | POA: Diagnosis not present

## 2023-06-11 DIAGNOSIS — I10 Essential (primary) hypertension: Secondary | ICD-10-CM | POA: Diagnosis not present

## 2023-06-11 DIAGNOSIS — Z955 Presence of coronary angioplasty implant and graft: Secondary | ICD-10-CM | POA: Diagnosis not present

## 2023-06-11 DIAGNOSIS — Z79899 Other long term (current) drug therapy: Secondary | ICD-10-CM | POA: Diagnosis not present

## 2023-06-11 DIAGNOSIS — M1712 Unilateral primary osteoarthritis, left knee: Secondary | ICD-10-CM | POA: Diagnosis not present

## 2023-06-11 DIAGNOSIS — I2510 Atherosclerotic heart disease of native coronary artery without angina pectoris: Secondary | ICD-10-CM | POA: Diagnosis not present

## 2023-06-11 DIAGNOSIS — F17210 Nicotine dependence, cigarettes, uncomplicated: Secondary | ICD-10-CM | POA: Diagnosis not present

## 2023-06-11 MED ORDER — OXYCODONE HCL 5 MG PO TABS: 5.0000 mg | ORAL_TABLET | ORAL | 0 refills | Status: DC | PRN

## 2023-06-11 MED ORDER — ASPIRIN 81 MG PO CHEW: 81.0000 mg | CHEWABLE_TABLET | Freq: Two times a day (BID) | ORAL | 0 refills | Status: DC

## 2023-06-11 NOTE — Plan of Care (Signed)
  Problem: Activity: Goal: Ability to avoid complications of mobility impairment will improve Outcome: Progressing   Problem: Clinical Measurements: Goal: Postoperative complications will be avoided or minimized Outcome: Progressing   Problem: Pain Management: Goal: Pain level will decrease with appropriate interventions Outcome: Progressing   Problem: Safety: Goal: Ability to remain free from injury will improve Outcome: Progressing

## 2023-06-11 NOTE — Progress Notes (Signed)
Physical Therapy Treatment Patient Details Name: Tammy Graham MRN: [id number] DOB: 22-Aug-1951 Today's Date: 06/11/2023   History of Present Illness Pt is a 72 y/o F admitted on 06/09/23 for scheduled L TKA. PMH: PAD, DM2, dyslipidemia, essential HTN, tobacco use, anxiety, CAD    PT Comments  Patient declined in AM.  Patient tolerated exercises , limited knee flexion tolerated. Patient ambulated x 120' with Rw . Patient will benefit from continued inpatient follow up therapy, <3 hours/day as patient has no support.    If plan is discharge home, recommend the following: A little help with walking and/or transfers;A little help with bathing/dressing/bathroom;Assistance with cooking/housework;Assist for transportation;Help with stairs or ramp for entrance   Can travel by private vehicle        Equipment Recommendations  Rolling walker (2 wheels)    Recommendations for Other Services       Precautions / Restrictions Precautions Precautions: Fall Restrictions LLE Weight Bearing: Weight bearing as tolerated     Mobility  Bed Mobility   Bed Mobility: Supine to Sit     Supine to sit: Min assist     General bed mobility comments: assist the left  leg , extra time    Transfers Overall transfer level: Needs assistance Equipment used: Rolling walker (2 wheels) Transfers: Sit to/from Stand Sit to Stand: Contact guard assist           General transfer comment: from EOB, BSC, cuing to use armrests , cues for LLE position    Ambulation/Gait Ambulation/Gait assistance: Contact guard assist Gait Distance (Feet): 120 Feet Assistive device: Rolling walker (2 wheels) Gait Pattern/deviations: Decreased step length - left, Antalgic Gait velocity: decreased     General Gait Details: cues for sequence and posture,   Stairs             Wheelchair Mobility     Tilt Bed    Modified Rankin (Stroke Patients Only)       Balance   Sitting-balance support: Feet  supported Sitting balance-Leahy Scale: Good     Standing balance support: During functional activity, Bilateral upper extremity supported, Reliant on assistive device for balance Standing balance-Leahy Scale: Fair                              Cognition Arousal: Alert Behavior During Therapy: WFL for tasks assessed/performed Overall Cognitive Status: Within Functional Limits for tasks assessed                                          Exercises Total Joint Exercises Quad Sets: AROM, Supine, Strengthening, Left, 10 reps Heel Slides: Supine, Strengthening, AAROM, Left, 10 reps Hip ABduction/ADduction: Supine, AAROM, Strengthening, 10 reps, Left Straight Leg Raises: AAROM, Supine, Strengthening, Left, 10 reps    General Comments        Pertinent Vitals/Pain Pain Assessment Faces Pain Scale: Hurts even more Pain Location: L knee with movement/gait Pain Descriptors / Indicators: Grimacing, Guarding, Discomfort Pain Intervention(s): Monitored during session, Premedicated before session    Home Living                          Prior Function            PT Goals (current goals can now be found in the care plan section) Progress towards  PT goals: Progressing toward goals    Frequency    7X/week      PT Plan      Co-evaluation              AM-PAC PT "6 Clicks" Mobility   Outcome Measure  Help needed turning from your back to your side while in a flat bed without using bedrails?: A Little Help needed moving from lying on your back to sitting on the side of a flat bed without using bedrails?: A Little Help needed moving to and from a bed to a chair (including a wheelchair)?: A Little Help needed standing up from a chair using your arms (e.g., wheelchair or bedside chair)?: A Little Help needed to walk in hospital room?: A Little Help needed climbing 3-5 steps with a railing? : A Lot 6 Click Score: 17    End of Session  Equipment Utilized During Treatment: Gait belt;Left knee immobilizer Activity Tolerance: Patient tolerated treatment well Patient left: in chair;with call bell/phone within reach Nurse Communication: Mobility status PT Visit Diagnosis: Muscle weakness (generalized) (M62.81);Pain;Difficulty in walking, not elsewhere classified (R26.2);Other abnormalities of gait and mobility (R26.89) Pain - Right/Left: Left Pain - part of body: Knee     Time: 1610-9604 PT Time Calculation (min) (ACUTE ONLY): 48 min  Charges:    $Gait Training: 8-22 mins $Therapeutic Exercise: 8-22 mins $Self Care/Home Management: 8-22 PT General Charges $$ ACUTE PT VISIT: 1 Visit                     Blanchard Kelch PT Acute Rehabilitation Services Office [phone] Weekend pager-[phone]    Rada Hay 06/11/2023, 3:53 PM

## 2023-06-11 NOTE — Anesthesia Postprocedure Evaluation (Signed)
Anesthesia Post Note  Patient: Tammy Graham  Procedure(s) Performed: LEFT TOTAL KNEE ARTHROPLASTY (Left: Knee)     Patient location during evaluation: PACU Anesthesia Type: Spinal Level of consciousness: sedated and patient cooperative Pain management: pain level controlled Vital Signs Assessment: post-procedure vital signs reviewed and stable Respiratory status: spontaneous breathing Cardiovascular status: stable Anesthetic complications: no   No notable events documented.  Last Vitals:  Vitals:   06/10/23 2212 06/11/23 0621  BP: 116/66 (!) 147/72  Pulse: 72 84  Resp: 16 16  Temp: 37.4 C 37.4 C  SpO2: 96% 90%    Last Pain:  Vitals:   06/11/23 0752  TempSrc:   PainSc: 10-Worst pain ever                 Tammy Graham

## 2023-06-11 NOTE — TOC Progression Note (Signed)
Transition of Care [facility]) - Progression Note    Patient Details  Name: Tammy Graham MRN: [id number] Date of Birth: Dec 24, 1950  Transition of Care [facility]) CM/SW Contact  Amada Jupiter, LCSW Phone Number: 06/11/2023, 3:40 PM  Clinical Narrative:     Provided pt with printout of SNFs who can make bed offers as patient wanted to talk with family and "look them up".  Returned later to get bed choice but pt simply shaking her head saying "I can't be rushed. The pain. I can't focus."  Explained that I will return in the morning to get choice  - pt agreeable.  Expected Discharge Plan: Skilled Nursing Facility Barriers to Discharge: SNF Pending bed offer, Insurance Authorization  Expected Discharge Plan and Services In-house Referral: Clinical Social Work   Post Acute Care Choice: Skilled Nursing Facility Living arrangements for the past 2 months: Single Family Home                 DME Arranged: N/A DME Agency: NA                   Social Determinants of Health (SDOH) Interventions SDOH Screenings   Food Insecurity: No Food Insecurity (06/09/2023)  Housing: Low Risk  (06/09/2023)  Transportation Needs: No Transportation Needs (06/09/2023)  Utilities: Not At Risk (06/09/2023)  Depression (PHQ2-9): Low Risk  (05/27/2023)  Social Connections: Unknown (03/18/2023)   Received from [facility], [facility]  Tobacco Use: High Risk (06/09/2023)    Readmission Risk Interventions    03/06/2021    1:59 PM  Readmission Risk Prevention Plan  Post Dischage Appt Complete  Medication Screening Complete  Transportation Screening Complete

## 2023-06-11 NOTE — Progress Notes (Signed)
Patient ID: Tammy Graham, female   DOB: [birth year], 72 y.o.   MRN: [id number] The patient is awake and alert.  Her left operative knee is stable.  The transitional care team is working on finding short-term skilled nursing placement.  She can be discharged from an orthopedic standpoint once a bed is been found and is available.

## 2023-06-12 DIAGNOSIS — F17210 Nicotine dependence, cigarettes, uncomplicated: Secondary | ICD-10-CM | POA: Diagnosis not present

## 2023-06-12 DIAGNOSIS — E119 Type 2 diabetes mellitus without complications: Secondary | ICD-10-CM | POA: Diagnosis not present

## 2023-06-12 DIAGNOSIS — Z8673 Personal history of transient ischemic attack (TIA), and cerebral infarction without residual deficits: Secondary | ICD-10-CM | POA: Diagnosis not present

## 2023-06-12 DIAGNOSIS — Z955 Presence of coronary angioplasty implant and graft: Secondary | ICD-10-CM | POA: Diagnosis not present

## 2023-06-12 DIAGNOSIS — Z7982 Long term (current) use of aspirin: Secondary | ICD-10-CM | POA: Diagnosis not present

## 2023-06-12 DIAGNOSIS — Z7984 Long term (current) use of oral hypoglycemic drugs: Secondary | ICD-10-CM | POA: Diagnosis not present

## 2023-06-12 DIAGNOSIS — Z79899 Other long term (current) drug therapy: Secondary | ICD-10-CM | POA: Diagnosis not present

## 2023-06-12 DIAGNOSIS — I2510 Atherosclerotic heart disease of native coronary artery without angina pectoris: Secondary | ICD-10-CM | POA: Diagnosis not present

## 2023-06-12 DIAGNOSIS — M1712 Unilateral primary osteoarthritis, left knee: Secondary | ICD-10-CM | POA: Diagnosis not present

## 2023-06-12 DIAGNOSIS — I10 Essential (primary) hypertension: Secondary | ICD-10-CM | POA: Diagnosis not present

## 2023-06-12 MED ORDER — HYDROCORTISONE 0.5 % EX CREA: TOPICAL_CREAM | Freq: Three times a day (TID) | CUTANEOUS | Status: DC | PRN

## 2023-06-12 MED ORDER — POLYETHYLENE GLYCOL 3350 17 G PO PACK: 17.0000 g | PACK | Freq: Every day | ORAL | Status: DC

## 2023-06-12 MED ORDER — POLYETHYLENE GLYCOL 3350 17 G PO PACK
17.0000 g | PACK | Freq: Every day | ORAL | Status: DC | PRN
  Administered 2023-06-14: 17 g via ORAL
  Filled 2023-06-12: qty 1

## 2023-06-12 NOTE — Progress Notes (Signed)
Physical Therapy Treatment Patient Details Name: Tammy Graham MRN: [id number] DOB: [birth year] Today's Date: 06/12/2023   History of Present Illness Pt is a 72 y/o F admitted on 06/09/23 for scheduled L TKA. PMH: PAD, DM2, dyslipidemia, essential HTN, tobacco use, anxiety, CAD    PT Comments  The patient is making progress in mobility, tolerated increased Left knee flexion but limited. Patient planning to go to rehab before returning home.     If plan is discharge home, recommend the following: A little help with walking and/or transfers;A little help with bathing/dressing/bathroom;Assistance with cooking/housework;Assist for transportation;Help with stairs or ramp for entrance   Can travel by private vehicle        Equipment Recommendations  Rolling walker (2 wheels)    Recommendations for Other Services       Precautions / Restrictions Precautions Precautions: Fall Restrictions LLE Weight Bearing: Weight bearing as tolerated     Mobility  Bed Mobility Overal bed mobility: Needs Assistance Bed Mobility: Supine to Sit     Supine to sit: Supervision     General bed mobility comments: self assist the left  leg with the belt, extra time    Transfers Overall transfer level: Needs assistance Equipment used: Rolling walker (2 wheels) Transfers: Sit to/from Stand Sit to Stand: Contact guard assist           General transfer comment: from EOB, BSC, cuing to use armrests , cues for LLE position    Ambulation/Gait Ambulation/Gait assistance: Contact guard assist Gait Distance (Feet): 20 Feet (then 140) Assistive device: Rolling walker (2 wheels) Gait Pattern/deviations: Decreased step length - left, Antalgic, Step-to pattern Gait velocity: decreased     General Gait Details: cues for sequence and posture,   Stairs             Wheelchair Mobility     Tilt Bed    Modified Rankin (Stroke Patients Only)       Balance Overall balance assessment:  Mild deficits observed, not formally tested                                          Cognition Arousal: Alert Behavior During Therapy: [facility] for tasks assessed/performed                                            Exercises Total Joint Exercises Ankle Circles/Pumps: AROM Quad Sets: AROM, Supine, Strengthening, Left, 10 reps Heel Slides: Supine, Strengthening, AAROM, Left, 10 reps Long Arc Quad: AROM, Seated, Strengthening, Left, 10 reps Knee Flexion: AAROM, Left, 10 reps, Seated Goniometric ROM: 0-40 left knee flexion    General Comments        Pertinent Vitals/Pain Pain Assessment Pain Score: 6  Pain Location: L knee with movement/gait Pain Descriptors / Indicators: Grimacing, Guarding, Discomfort Pain Intervention(s): Monitored during session, Premedicated before session, Ice applied    Home Living                          Prior Function            PT Goals (current goals can now be found in the care plan section) Progress towards PT goals: Progressing toward goals    Frequency    7X/week  PT Plan      Co-evaluation              AM-PAC PT "6 Clicks" Mobility   Outcome Measure  Help needed turning from your back to your side while in a flat bed without using bedrails?: A Little Help needed moving from lying on your back to sitting on the side of a flat bed without using bedrails?: A Little Help needed moving to and from a bed to a chair (including a wheelchair)?: A Little Help needed standing up from a chair using your arms (e.g., wheelchair or bedside chair)?: A Little Help needed to walk in hospital room?: A Little Help needed climbing 3-5 steps with a railing? : A Lot 6 Click Score: 17    End of Session Equipment Utilized During Treatment: Gait belt Activity Tolerance: Patient tolerated treatment well Patient left: in chair;with call bell/phone within reach Nurse Communication: Mobility  status PT Visit Diagnosis: Muscle weakness (generalized) (M62.81);Pain;Difficulty in walking, not elsewhere classified (R26.2);Other abnormalities of gait and mobility (R26.89) Pain - Right/Left: Left Pain - part of body: Knee     Time: 0936-1020 PT Time Calculation (min) (ACUTE ONLY): 44 min  Charges:    $Gait Training: 8-22 mins $Therapeutic Exercise: 8-22 mins $Self Care/Home Management: 8-22 PT General Charges $$ ACUTE PT VISIT: 1 Visit                     Blanchard Kelch PT Acute Rehabilitation Services Office [phone] Weekend pager-[phone]    Rada Hay 06/12/2023, 12:59 PM

## 2023-06-12 NOTE — TOC Progression Note (Signed)
Transition of Care [facility]) - Progression Note    Patient Details  Name: Tammy Graham MRN: [id number] Date of Birth: 1951-03-24  [facility] Care [facility]) CM/SW Contact  Amada Jupiter, LCSW Phone Number: 06/12/2023, 10:07 AM  Clinical Narrative:     Met with patient and she accepts SNF bed at [facility].  Confirmed bed acceptance with facility and facility will begin insurance authorization.  Will keep team posted when auth secured and pt cleared for admission.  Expected Discharge Plan: Skilled Nursing Facility Barriers to Discharge: SNF Pending bed offer, Insurance Authorization  Expected Discharge Plan and Services In-house Referral: Clinical Social Work   Post Acute Care Choice: Skilled Nursing Facility Living arrangements for the past 2 months: Single Family Home Expected Discharge Date: 06/12/23               DME Arranged: N/A DME Agency: NA                   Social Determinants of Health (SDOH) Interventions SDOH Screenings   Food Insecurity: No Food Insecurity (06/09/2023)  Housing: Low Risk  (06/09/2023)  Transportation Needs: No Transportation Needs (06/09/2023)  Utilities: Not At Risk (06/09/2023)  Depression (PHQ2-9): Low Risk  (05/27/2023)  Social Connections: Unknown (03/18/2023)   Received from [facility], [facility]  Tobacco Use: High Risk (06/09/2023)    Readmission Risk Interventions    03/06/2021    1:59 PM  Readmission Risk Prevention Plan  Post Dischage Appt Complete  Medication Screening Complete  Transportation Screening Complete

## 2023-06-12 NOTE — Plan of Care (Signed)
  Problem: Activity: Goal: Ability to avoid complications of mobility impairment will improve Outcome: Progressing Goal: Range of joint motion will improve Outcome: Progressing   Problem: Clinical Measurements: Goal: Postoperative complications will be avoided or minimized Outcome: Progressing   Problem: Pain Management: Goal: Pain level will decrease with appropriate interventions Outcome: Progressing   Problem: Skin Integrity: Goal: Will show signs of wound healing Outcome: Progressing   Problem: Health Behavior/Discharge Planning: Goal: Ability to manage health-related needs will improve Outcome: Progressing   Problem: Clinical Measurements: Goal: Ability to maintain clinical measurements within normal limits will improve Outcome: Progressing Goal: Will remain free from infection Outcome: Progressing Goal: Diagnostic test results will improve Outcome: Progressing Goal: Respiratory complications will improve Outcome: Progressing Goal: Cardiovascular complication will be avoided Outcome: Progressing   Problem: Activity: Goal: Risk for activity intolerance will decrease Outcome: Progressing   Problem: Nutrition: Goal: Adequate nutrition will be maintained Outcome: Progressing   Problem: Coping: Goal: Level of anxiety will decrease Outcome: Progressing   Problem: Elimination: Goal: Will not experience complications related to bowel motility Outcome: Progressing   Problem: Pain Managment: Goal: General experience of comfort will improve Outcome: Progressing   Problem: Safety: Goal: Ability to remain free from injury will improve Outcome: Progressing   Problem: Skin Integrity: Goal: Risk for impaired skin integrity will decrease Outcome: Progressing

## 2023-06-12 NOTE — Progress Notes (Signed)
Subjective: 3 Days Post-Op Procedure(s) (LRB): LEFT TOTAL KNEE ARTHROPLASTY (Left) Patient reports pain as moderate.    Objective: Vital signs in last 24 hours: Temp:  [98.6 F (37 C)] 98.6 F (37 C) (08/23 0626) Pulse Rate:  [67-75] 75 (08/23 0626) Resp:  [16-18] 17 (08/23 0626) BP: (139-145)/(74-84) 140/74 (08/23 0626) SpO2:  [90 %-95 %] 90 % (08/23 0626)  Intake/Output from previous day: 08/22 0701 - 08/23 0700 In: 360 [P.O.:360] Out: 300 [Urine:300] Intake/Output this shift: No intake/output data recorded.  Recent Labs    06/10/23 0351  HGB 14.0   Recent Labs    06/10/23 0351  WBC 11.4*  RBC 4.26  HCT 41.8  PLT 179   Recent Labs    06/10/23 0351  NA 137  K 4.0  CL 103  CO2 26  BUN 15  CREATININE 1.02*  GLUCOSE 155*  CALCIUM 9.2   No results for input(s): "LABPT", "INR" in the last 72 hours.  Sensation intact distally Intact pulses distally Dorsiflexion/Plantar flexion intact Incision: dressing C/D/I Compartment soft   Assessment/Plan: 3 Days Post-Op Procedure(s) (LRB): LEFT TOTAL KNEE ARTHROPLASTY (Left) Discharge to SNF      Kathryne Hitch 06/12/2023, 7:07 AM

## 2023-06-12 NOTE — Discharge Summary (Signed)
Patient ID: Tammy Graham MRN: [id number] DOB/AGE: Oct 31, 1950 72 y.o.  Admit date: 06/09/2023 Discharge date: 06/12/2023  Admission Diagnoses:  Principal Problem:   Unilateral primary osteoarthritis, left knee Active Problems:   Status post total left knee replacement   Discharge Diagnoses:  Same  Past Medical History:  Diagnosis Date   Anxiety    Arthritis    shoulder and knees   CAD (coronary artery disease)    Complication of anesthesia    states she was still awake one time when surgery started   DM (diabetes mellitus) (HCC)    Family history of adverse reaction to anesthesia    tooks alot to put to sleep, slow to awaken.   HTN (hypertension)    Hyperlipemia    Myocardial infarction [facility] [place])    Peripheral vascular disease (HCC)    Pneumonia    Sleep apnea    no cpap   Stroke (HCC)    2012, no residual    Surgeries: Procedure(s): LEFT TOTAL KNEE ARTHROPLASTY on 06/09/2023   Consultants:   Discharged Condition: Improved  Hospital Course: Tammy Graham is an 72 y.o. female who was admitted 06/09/2023 for operative treatment ofUnilateral primary osteoarthritis, left knee. Patient has severe unremitting pain that affects sleep, daily activities, and work/hobbies. After pre-op clearance the patient was taken to the operating room on 06/09/2023 and underwent  Procedure(s): LEFT TOTAL KNEE ARTHROPLASTY.    Patient was given perioperative antibiotics:  Anti-infectives (From admission, onward)    Start     Dose/Rate Route Frequency Ordered Stop   06/09/23 1400  ceFAZolin (ANCEF) IVPB 1 g/50 mL premix        1 g 100 mL/hr over 30 Minutes Intravenous Every 6 hours 06/09/23 1033 06/09/23 2155   06/09/23 0600  ceFAZolin (ANCEF) IVPB 2g/100 mL premix        2 g 200 mL/hr over 30 Minutes Intravenous On call to O.R. 06/09/23 4098 06/09/23 0748        Patient was given sequential compression devices, early ambulation, and chemoprophylaxis to prevent DVT.  Patient  benefited maximally from hospital stay and there were no complications.    Recent vital signs: Patient Vitals for the past 24 hrs:  BP Temp Temp src Pulse Resp SpO2  06/12/23 0626 (!) 140/74 98.6 F (37 C) Oral 75 17 90 %  06/11/23 2132 139/84 98.6 F (37 C) Oral 67 16 93 %  06/11/23 1255 (!) 145/78 98.6 F (37 C) -- 70 18 95 %     Recent laboratory studies:  Recent Labs    06/10/23 0351  WBC 11.4*  HGB 14.0  HCT 41.8  PLT 179  NA 137  K 4.0  CL 103  CO2 26  BUN 15  CREATININE 1.02*  GLUCOSE 155*  CALCIUM 9.2     Discharge Medications:   Allergies as of 06/12/2023       Reactions   Chlorthalidone Other (See Comments)   headache   Plavix [clopidogrel Bisulfate] Itching   Rosuvastatin Hives   Patient is allergic to generic rosuvastatin. She says she is able to take branded Crestor.    Simvastatin Other (See Comments)   unknown   Benadryl [diphenhydramine Hcl (sleep)] Rash        Medication List     STOP taking these medications    aspirin EC 81 MG tablet Replaced by: aspirin 81 MG chewable tablet       TAKE these medications    aspirin 81 MG chewable  tablet Chew 1 tablet (81 mg total) by mouth 2 (two) times daily. Replaces: aspirin EC 81 MG tablet   atorvastatin 20 MG tablet Commonly known as: LIPITOR Take 1 tablet (20 mg total) by mouth daily.   carvedilol 12.5 MG tablet Commonly known as: COREG TAKE 1 TABLET(12.5 MG) BY MOUTH TWICE DAILY WITH A MEAL   chlorthalidone 25 MG tablet Commonly known as: HYGROTON Take 1 tablet (25 mg total) by mouth daily.   Farxiga 10 MG Tabs tablet Generic drug: dapagliflozin propanediol Take 1 tablet (10 mg total) by mouth daily before breakfast.   fluconazole 150 MG tablet Commonly known as: DIFLUCAN TAKE 1 TABLET BY MOUTH DAILY What changed:  when to take this reasons to take this   glipiZIDE 10 MG 24 hr tablet Commonly known as: GLUCOTROL XL TAKE 1 TABLET(10 MG) BY MOUTH DAILY WITH BREAKFAST*  DISCONTINUE IMMEDIATE RELEASE GLIPIZIDE*   Misc. Devices Misc Shower bench, Above the toilet seat.  Diagnosis- Type 2 Diabetes Mellitus   Misc. Devices Misc Blood pressure monitor.  Diagnosis hypertension   multivitamin with minerals Tabs tablet Take 1 tablet by mouth in the morning.   nicotine 14 mg/24hr patch Commonly known as: Nicoderm CQ Place 1 patch (14 mg total) onto the skin daily.   onetouch ultrasoft lancets Use to test blood sugar 3 times daily   OneTouch Verio test strip Generic drug: glucose blood Use to test blood sugar 3 times daily   OneTouch Verio w/Device Kit Use to test blood sugar 3 times daily   oxyCODONE 5 MG immediate release tablet Commonly known as: Oxy IR/ROXICODONE Take 1-2 tablets (5-10 mg total) by mouth every 4 (four) hours as needed for moderate pain (pain score 4-6).   valsartan 320 MG tablet Commonly known as: DIOVAN Take 1 tablet (320 mg total) by mouth daily.   VISINE OP Place 1 drop into both eyes daily.               Durable Medical Equipment  (From admission, onward)           Start     Ordered   06/09/23 1034  DME 3 n 1  Once        06/09/23 1033   06/09/23 1034  DME Walker rolling  Once       Question Answer Comment  Walker: With 5 Inch Wheels   Patient needs a walker to treat with the following condition Status post total left knee replacement      06/09/23 1033            Diagnostic Studies: DG Knee Left Port  Result Date: 06/09/2023 CLINICAL DATA:  Status post knee replacement. EXAM: PORTABLE LEFT KNEE - 1-2 VIEW COMPARISON:  None Available. FINDINGS: Left knee arthroplasty in expected alignment. No periprosthetic lucency or fracture. There has been patellar resurfacing. Recent postsurgical change includes air and edema in the soft tissues and joint space. Anterior skin staples in place. IMPRESSION: Left knee arthroplasty without immediate postoperative complication. Electronically Signed   By: Narda Rutherford M.D.   On: 06/09/2023 10:47    Disposition: Discharge disposition: 03-Skilled Nursing Facility          Follow-up Information     Kathryne Hitch, MD Follow up in 2 week(s).   Specialty: Orthopedic Surgery Contact information: [address] [place] [address] [phone]                  Signed: Kathryne Hitch 06/12/2023,  7:09 AM

## 2023-06-13 DIAGNOSIS — E119 Type 2 diabetes mellitus without complications: Secondary | ICD-10-CM | POA: Diagnosis not present

## 2023-06-13 DIAGNOSIS — I2510 Atherosclerotic heart disease of native coronary artery without angina pectoris: Secondary | ICD-10-CM | POA: Diagnosis not present

## 2023-06-13 DIAGNOSIS — F17210 Nicotine dependence, cigarettes, uncomplicated: Secondary | ICD-10-CM | POA: Diagnosis not present

## 2023-06-13 DIAGNOSIS — Z7982 Long term (current) use of aspirin: Secondary | ICD-10-CM | POA: Diagnosis not present

## 2023-06-13 DIAGNOSIS — M1712 Unilateral primary osteoarthritis, left knee: Secondary | ICD-10-CM | POA: Diagnosis not present

## 2023-06-13 DIAGNOSIS — Z8673 Personal history of transient ischemic attack (TIA), and cerebral infarction without residual deficits: Secondary | ICD-10-CM | POA: Diagnosis not present

## 2023-06-13 DIAGNOSIS — Z955 Presence of coronary angioplasty implant and graft: Secondary | ICD-10-CM | POA: Diagnosis not present

## 2023-06-13 DIAGNOSIS — I10 Essential (primary) hypertension: Secondary | ICD-10-CM | POA: Diagnosis not present

## 2023-06-13 DIAGNOSIS — Z7984 Long term (current) use of oral hypoglycemic drugs: Secondary | ICD-10-CM | POA: Diagnosis not present

## 2023-06-13 DIAGNOSIS — Z79899 Other long term (current) drug therapy: Secondary | ICD-10-CM | POA: Diagnosis not present

## 2023-06-13 NOTE — Progress Notes (Signed)
Physical Therapy Treatment Patient Details Name: Tammy Graham MRN: [id number] DOB: 06/19/1951 Today's Date: 06/13/2023   History of Present Illness Pt is a 72 y/o F admitted on 06/09/23 for scheduled L TKA. PMH: PAD, DM2, dyslipidemia, essential HTN, tobacco use, anxiety, CAD    PT Comments  Pt agreeable to working with therapy. Significant pain with activity on today. Pt performed ROM exercises and gait training. Ice to L knee at end of session. Will continue to follow . Pt reports she would like to be able to d/c home but that she does not have any assistance. Plan is possibly for ST SNF once ready.     If plan is discharge home, recommend the following: A little help with walking and/or transfers;A little help with bathing/dressing/bathroom;Assistance with cooking/housework;Assist for transportation;Help with stairs or ramp for entrance   Can travel by private vehicle        Equipment Recommendations  Rolling walker (2 wheels)    Recommendations for Other Services       Precautions / Restrictions Precautions Precautions: Fall Restrictions Weight Bearing Restrictions: No LLE Weight Bearing: Weight bearing as tolerated     Mobility  Bed Mobility Overal bed mobility: Needs Assistance Bed Mobility: Supine to Sit, Sit to Supine     Supine to sit: Supervision Sit to supine: Min assist   General bed mobility comments: Small amount of assist for L LE back onto bed 2* pain.    Transfers Overall transfer level: Needs assistance Equipment used: Rolling walker (2 wheels) Transfers: Sit to/from Stand, Bed to chair/wheelchair/BSC Sit to Stand: Contact guard assist Stand pivot transfers: Contact guard assist         General transfer comment: Cues for safety, technique,hand/LE placement. Close guard for safety. Increased time. Stand pivot to bsc    Ambulation/Gait Ambulation/Gait assistance: Contact guard assist Gait Distance (Feet): 75 Feet Assistive device: Rolling  walker (2 wheels) Gait Pattern/deviations: Step-to pattern, Antalgic       General Gait Details: Cues for safety, sequencing. Several standing rest breaks taken 2* pain, fatigue.   Stairs             Wheelchair Mobility     Tilt Bed    Modified Rankin (Stroke Patients Only)       Balance Overall balance assessment: Needs assistance   Sitting balance-Leahy Scale: Good     Standing balance support: During functional activity, Bilateral upper extremity supported, Reliant on assistive device for balance Standing balance-Leahy Scale: Fair                              Cognition Arousal: Alert Behavior During Therapy: WFL for tasks assessed/performed Overall Cognitive Status: Within Functional Limits for tasks assessed                                          Exercises Total Joint Exercises Ankle Circles/Pumps: AROM, Both, 10 reps Quad Sets: AROM, Left, 10 reps Heel Slides: AAROM, Left, 10 reps, Supine (used gait belt) Hip ABduction/ADduction: AAROM, Left, 10 reps, Supine (used gait belt) Straight Leg Raises: AAROM, Left, 10 reps, Supine (used gait belt) Goniometric ROM: ~5-60 degrees    General Comments        Pertinent Vitals/Pain Pain Assessment Pain Assessment: 0-10 Pain Score: 9  Pain Location: L knee with activity Pain Descriptors / Indicators:  Grimacing, Guarding, Discomfort Pain Intervention(s): Limited activity within patient's tolerance, Monitored during session, Repositioned, Ice applied    Home Living                          Prior Function            PT Goals (current goals can now be found in the care plan section) Progress towards PT goals: Progressing toward goals    Frequency    7X/week      PT Plan      Co-evaluation              AM-PAC PT "6 Clicks" Mobility   Outcome Measure  Help needed turning from your back to your side while in a flat bed without using bedrails?: A  Little Help needed moving from lying on your back to sitting on the side of a flat bed without using bedrails?: A Little Help needed moving to and from a bed to a chair (including a wheelchair)?: A Little Help needed standing up from a chair using your arms (e.g., wheelchair or bedside chair)?: A Little Help needed to walk in hospital room?: A Little Help needed climbing 3-5 steps with a railing? : A Lot 6 Click Score: 17    End of Session Equipment Utilized During Treatment: Gait belt Activity Tolerance: Patient tolerated treatment well;Patient limited by pain Patient left: in bed;with call bell/phone within reach;with bed alarm set   PT Visit Diagnosis: Muscle weakness (generalized) (M62.81);Pain;Difficulty in walking, not elsewhere classified (R26.2);Other abnormalities of gait and mobility (R26.89) Pain - Right/Left: Left Pain - part of body: Knee     Time: 5427-0623 PT Time Calculation (min) (ACUTE ONLY): 51 min  Charges:    $Gait Training: 23-37 mins $Therapeutic Exercise: 8-22 mins PT General Charges $$ ACUTE PT VISIT: 1 Visit                         Faye Ramsay, PT Acute Rehabilitation  Office: [phone]

## 2023-06-13 NOTE — Progress Notes (Signed)
Subjective: 4 Days Post-Op Procedure(s) (LRB): LEFT TOTAL KNEE ARTHROPLASTY (Left) Patient reports pain as moderate.  Otherwise no complaints.   Objective: Vital signs in last 24 hours: Temp:  [98.4 F (36.9 C)-98.5 F (36.9 C)] 98.5 F (36.9 C) (08/24 0524) Pulse Rate:  [69-72] 69 (08/24 0524) Resp:  [18] 18 (08/24 0524) BP: (123-152)/(77) 152/77 (08/24 0524) SpO2:  [95 %-97 %] 95 % (08/24 0524)  Intake/Output from previous day: 08/23 0701 - 08/24 0700 In: 460 [P.O.:460] Out: 300 [Urine:300] Intake/Output this shift: Total I/O In: 120 [P.O.:120] Out: 200 [Urine:200]  No results for input(s): "HGB" in the last 72 hours. No results for input(s): "WBC", "RBC", "HCT", "PLT" in the last 72 hours. No results for input(s): "NA", "K", "CL", "CO2", "BUN", "CREATININE", "GLUCOSE", "CALCIUM" in the last 72 hours. No results for input(s): "LABPT", "INR" in the last 72 hours.  Sensation intact distally Dorsiflexion/Plantar flexion intact Incision: dressing C/D/I Compartment soft   Assessment/Plan: 4 Days Post-Op Procedure(s) (LRB): LEFT TOTAL KNEE ARTHROPLASTY (Left) Up with therapy Discharge to SNF when bed available.      Cecely Rengel 06/13/2023, 11:55 AM

## 2023-06-13 NOTE — TOC Progression Note (Signed)
Transition of Care [facility]) - Progression Note    Patient Details  Name: Tammy Graham MRN: [id number] Date of Birth: Nov 03, 1950  [facility] of [facility]) CM/SW Contact  Georgie Chard, LCSW Phone Number: 06/13/2023, 2:32 PM  Clinical Narrative:    CSW reached out to [facility] at this time AUTH is currently still pending TOC will continue to follow.   Expected Discharge Plan: Skilled Nursing Facility Barriers to Discharge: SNF Pending bed offer, Insurance Authorization  Expected Discharge Plan and Services In-house Referral: Clinical Social Work   Post Acute Care Choice: Skilled Nursing Facility Living arrangements for the past 2 months: Single Family Home Expected Discharge Date: 06/14/23               DME Arranged: N/A DME Agency: NA                   Social Determinants of Health (SDOH) Interventions SDOH Screenings   Food Insecurity: No Food Insecurity (06/09/2023)  Housing: Low Risk  (06/09/2023)  Transportation Needs: No Transportation Needs (06/09/2023)  Utilities: Not At Risk (06/09/2023)  Depression (PHQ2-9): Low Risk  (05/27/2023)  Social Connections: Unknown (03/18/2023)   Received from [facility]  Tobacco Use: High Risk (06/09/2023)    Readmission Risk Interventions    03/06/2021    1:59 PM  Readmission Risk Prevention Plan  Post Dischage Appt Complete  Medication Screening Complete  Transportation Screening Complete

## 2023-06-13 NOTE — Plan of Care (Signed)
  Problem: Activity: Goal: Ability to avoid complications of mobility impairment will improve Outcome: Progressing Goal: Range of joint motion will improve Outcome: Progressing   Problem: Pain Management: Goal: Pain level will decrease with appropriate interventions Outcome: Progressing   Problem: Activity: Goal: Risk for activity intolerance will decrease Outcome: Progressing   Problem: Elimination: Goal: Will not experience complications related to bowel motility Outcome: Progressing   Problem: Pain Managment: Goal: General experience of comfort will improve Outcome: Progressing   Problem: Safety: Goal: Ability to remain free from injury will improve Outcome: Progressing

## 2023-06-14 DIAGNOSIS — I2510 Atherosclerotic heart disease of native coronary artery without angina pectoris: Secondary | ICD-10-CM | POA: Diagnosis not present

## 2023-06-14 DIAGNOSIS — I10 Essential (primary) hypertension: Secondary | ICD-10-CM | POA: Diagnosis not present

## 2023-06-14 DIAGNOSIS — F17210 Nicotine dependence, cigarettes, uncomplicated: Secondary | ICD-10-CM | POA: Diagnosis not present

## 2023-06-14 DIAGNOSIS — Z7982 Long term (current) use of aspirin: Secondary | ICD-10-CM | POA: Diagnosis not present

## 2023-06-14 DIAGNOSIS — E119 Type 2 diabetes mellitus without complications: Secondary | ICD-10-CM | POA: Diagnosis not present

## 2023-06-14 DIAGNOSIS — M1712 Unilateral primary osteoarthritis, left knee: Secondary | ICD-10-CM | POA: Diagnosis not present

## 2023-06-14 DIAGNOSIS — Z8673 Personal history of transient ischemic attack (TIA), and cerebral infarction without residual deficits: Secondary | ICD-10-CM | POA: Diagnosis not present

## 2023-06-14 DIAGNOSIS — Z79899 Other long term (current) drug therapy: Secondary | ICD-10-CM | POA: Diagnosis not present

## 2023-06-14 DIAGNOSIS — Z955 Presence of coronary angioplasty implant and graft: Secondary | ICD-10-CM | POA: Diagnosis not present

## 2023-06-14 DIAGNOSIS — Z7984 Long term (current) use of oral hypoglycemic drugs: Secondary | ICD-10-CM | POA: Diagnosis not present

## 2023-06-14 MED ORDER — FLUCONAZOLE 150 MG PO TABS
150.0000 mg | ORAL_TABLET | Freq: Every day | ORAL | Status: AC
  Administered 2023-06-14: 150 mg via ORAL
  Filled 2023-06-14: qty 1

## 2023-06-14 NOTE — Progress Notes (Signed)
Physical Therapy Treatment Patient Details Name: Tammy Graham MRN: [id number] DOB: [birth year] Today's Date: 06/14/2023   History of Present Illness Pt is a 72 y/o F admitted on 06/09/23 for scheduled L TKA. PMH: PAD, DM2, dyslipidemia, essential HTN, tobacco use, anxiety, CAD    PT Comments  Progressing with mobility. Pain rated 8/10.     If plan is discharge home, recommend the following: A little help with walking and/or transfers;A little help with bathing/dressing/bathroom;Assistance with cooking/housework;Assist for transportation;Help with stairs or ramp for entrance   Can travel by private vehicle        Equipment Recommendations  Rolling walker (2 wheels)    Recommendations for Other Services       Precautions / Restrictions Precautions Precautions: Fall Restrictions Weight Bearing Restrictions: No LLE Weight Bearing: Weight bearing as tolerated     Mobility  Bed Mobility Overal bed mobility: Needs Assistance Bed Mobility: Sit to Supine       Sit to supine: Contact guard assist, HOB elevated   General bed mobility comments: Pt used her UEs to assist L LE back onto bed. Increased time.    Transfers Overall transfer level: Needs assistance Equipment used: Rolling walker (2 wheels) Transfers: Sit to/from Stand Sit to Stand: Contact guard assist           General transfer comment: Cues for safety, technique,hand/LE placement. Close guard for safety. Increased time.    Ambulation/Gait Ambulation/Gait assistance: Contact guard assist Gait Distance (Feet): 135 Feet Assistive device: Rolling walker (2 wheels) Gait Pattern/deviations: Step-to pattern       General Gait Details: Cues for safety, sequencing. Several standing rest breaks taken 2* pain, fatigue.   Stairs             Wheelchair Mobility     Tilt Bed    Modified Rankin (Stroke Patients Only)       Balance Overall balance assessment: Needs assistance         Standing  balance support: During functional activity, Bilateral upper extremity supported, Reliant on assistive device for balance                                Cognition Arousal: Alert Behavior During Therapy: WFL for tasks assessed/performed Overall Cognitive Status: Within Functional Limits for tasks assessed                                          Exercises Total Joint Exercises Quad Sets: AROM, Left, 10 reps, Supine Heel Slides: AAROM, Left, 10 reps, Supine (used gait belt to assist)    General Comments        Pertinent Vitals/Pain Pain Assessment Pain Assessment: 0-10 Pain Score: 8  Pain Location: L knee with activity Pain Descriptors / Indicators: Grimacing, Guarding, Discomfort Pain Intervention(s): Limited activity within patient's tolerance, Monitored during session, Repositioned (pt declined ice end of session-stated she will ask to have it back on later)    Home Living                          Prior Function            PT Goals (current goals can now be found in the care plan section) Progress towards PT goals: Progressing toward goals    Frequency  7X/week      PT Plan      Co-evaluation              AM-PAC PT "6 Clicks" Mobility   Outcome Measure  Help needed turning from your back to your side while in a flat bed without using bedrails?: A Little Help needed moving from lying on your back to sitting on the side of a flat bed without using bedrails?: A Little Help needed moving to and from a bed to a chair (including a wheelchair)?: A Little Help needed standing up from a chair using your arms (e.g., wheelchair or bedside chair)?: A Little Help needed to walk in hospital room?: A Little Help needed climbing 3-5 steps with a railing? : A Lot 6 Click Score: 17    End of Session Equipment Utilized During Treatment: Gait belt;Left knee immobilizer Activity Tolerance: Patient tolerated treatment  well;Patient limited by pain Patient left: in bed;with call bell/phone within reach;with bed alarm set;with family/visitor present   PT Visit Diagnosis: Muscle weakness (generalized) (M62.81);Pain;Difficulty in walking, not elsewhere classified (R26.2);Other abnormalities of gait and mobility (R26.89) Pain - Right/Left: Left Pain - part of body: Knee     Time: 1515-1540 PT Time Calculation (min) (ACUTE ONLY): 25 min  Charges:    $Gait Training: 8-22 mins $Therapeutic Exercise: 8-22 mins PT General Charges $$ ACUTE PT VISIT: 1 Visit                         Faye Ramsay, PT Acute Rehabilitation  Office: [phone]

## 2023-06-14 NOTE — Plan of Care (Signed)
  Problem: Activity: Goal: Ability to avoid complications of mobility impairment will improve Outcome: Adequate for Discharge Goal: Range of joint motion will improve Outcome: Adequate for Discharge   Problem: Pain Management: Goal: Pain level will decrease with appropriate interventions Outcome: Progressing   Problem: Activity: Goal: Risk for activity intolerance will decrease Outcome: Adequate for Discharge   Problem: Elimination: Goal: Will not experience complications related to bowel motility Outcome: Progressing   Problem: Pain Managment: Goal: General experience of comfort will improve Outcome: Progressing   Problem: Safety: Goal: Ability to remain free from injury will improve Outcome: Adequate for Discharge

## 2023-06-14 NOTE — Progress Notes (Signed)
Subjective: 5 Days Post-Op Procedure(s) (LRB): LEFT TOTAL KNEE ARTHROPLASTY (Left) Patient reports pain as mild.  Reports itching between legs states "I have a yeast infection". Otherwise no complaints.   Objective: Vital signs in last 24 hours: Temp:  [98.5 F (36.9 C)-99.3 F (37.4 C)] 98.5 F (36.9 C) (08/25 5956) Pulse Rate:  [73-87] 73 (08/25 0633) Resp:  [17-18] 18 (08/25 0633) BP: (102-149)/(66-85) 149/85 (08/25 0633) SpO2:  [93 %-96 %] 96 % (08/25 3875)  Intake/Output from previous day: 08/24 0701 - 08/25 0700 In: 600 [P.O.:600] Out: 1700 [Urine:1700] Intake/Output this shift: No intake/output data recorded.  No results for input(s): "HGB" in the last 72 hours. No results for input(s): "WBC", "RBC", "HCT", "PLT" in the last 72 hours. No results for input(s): "NA", "K", "CL", "CO2", "BUN", "CREATININE", "GLUCOSE", "CALCIUM" in the last 72 hours. No results for input(s): "LABPT", "INR" in the last 72 hours.  Dorsiflexion/Plantar flexion intact Incision: dressing C/D/I Compartment soft   Assessment/Plan: 5 Days Post-Op Procedure(s) (LRB): LEFT TOTAL KNEE ARTHROPLASTY (Left) Up with therapy Discharge to SNF when bed available. Diflucan ordered for yeast infection.       Tammy Graham 06/14/2023, 9:40 AM

## 2023-06-15 DIAGNOSIS — I2510 Atherosclerotic heart disease of native coronary artery without angina pectoris: Secondary | ICD-10-CM | POA: Diagnosis not present

## 2023-06-15 DIAGNOSIS — I10 Essential (primary) hypertension: Secondary | ICD-10-CM | POA: Diagnosis not present

## 2023-06-15 DIAGNOSIS — F17210 Nicotine dependence, cigarettes, uncomplicated: Secondary | ICD-10-CM | POA: Diagnosis not present

## 2023-06-15 DIAGNOSIS — M1712 Unilateral primary osteoarthritis, left knee: Secondary | ICD-10-CM | POA: Diagnosis not present

## 2023-06-15 DIAGNOSIS — E119 Type 2 diabetes mellitus without complications: Secondary | ICD-10-CM | POA: Diagnosis not present

## 2023-06-15 DIAGNOSIS — Z79899 Other long term (current) drug therapy: Secondary | ICD-10-CM | POA: Diagnosis not present

## 2023-06-15 DIAGNOSIS — Z955 Presence of coronary angioplasty implant and graft: Secondary | ICD-10-CM | POA: Diagnosis not present

## 2023-06-15 DIAGNOSIS — Z7982 Long term (current) use of aspirin: Secondary | ICD-10-CM | POA: Diagnosis not present

## 2023-06-15 DIAGNOSIS — Z8673 Personal history of transient ischemic attack (TIA), and cerebral infarction without residual deficits: Secondary | ICD-10-CM | POA: Diagnosis not present

## 2023-06-15 DIAGNOSIS — Z7984 Long term (current) use of oral hypoglycemic drugs: Secondary | ICD-10-CM | POA: Diagnosis not present

## 2023-06-15 MED ORDER — ASPIRIN 81 MG PO CHEW: 81.0000 mg | CHEWABLE_TABLET | Freq: Two times a day (BID) | ORAL | 0 refills | Status: AC

## 2023-06-15 MED ORDER — OXYCODONE HCL 5 MG PO TABS: 5.0000 mg | ORAL_TABLET | ORAL | 0 refills | Status: DC | PRN

## 2023-06-15 NOTE — Progress Notes (Addendum)
Physical Therapy Treatment Patient Details Name: Tammy Graham MRN: [id number] DOB: October 17, 1951 Today's Date: 06/15/2023   History of Present Illness Pt is a 72 y/o F admitted on 06/09/23 for scheduled L TKA. PMH: PAD, DM2, dyslipidemia, essential HTN, tobacco use, anxiety, CAD    PT Comments  The  patient reports that she is going home if rehab bed not available today.patient reporting that she cannot get a RW into her BR. Attempted to discuss with patient concerns for her DC home alone. Patient then said she would stay with her family tonight and go to her apt tomorrow. Family not present.  Patient has 5-6 STE so practiced the steps using 1 rail. Patient instructed that she must use th KI for negotiating steps .  Patient states her equipment is at her son's home, Reiterated to patient that she must arrange to have the RW for her at DC if she will be going home vs rehab setting.  Informed patient that she cannot use a cane now, must have RW. TOC aware of plans for Dc home at the present and arranging for HHPT.  Patient states that she will be ok, PT continued to ask about getting through apt and into BR, patient stated 'I have to get out of here, I will be OK."   Instructed patient on don/doff KI and cut to fit  better.    If plan is discharge home, recommend the following: A little help with walking and/or transfers;A little help with bathing/dressing/bathroom;Assistance with cooking/housework;Assist for transportation;Help with stairs or ramp for entrance   Can travel by private vehicle        Equipment Recommendations  Rolling walker (2 wheels)    Recommendations for Other Services       Precautions / Restrictions       Mobility  Bed Mobility   Bed Mobility: Sit to Supine     Supine to sit: Supervision     General bed mobility comments: Pt used her UEs and  belt to self move the left leg over bed edge to floor    Transfers Overall transfer level: Needs  assistance Equipment used: Rolling walker (2 wheels) Transfers: Sit to/from Stand Sit to Stand: Contact guard assist Stand pivot transfers: Contact guard assist         General transfer comment: Cues for safety, technique,hand/LE placement. Close guard for safety. Increased time.    Ambulation/Gait Ambulation/Gait assistance: Contact guard assist Gait Distance (Feet): 135 Feet Assistive device: Rolling walker (2 wheels) Gait Pattern/deviations: Step-to pattern, Antalgic       General Gait Details: Cues for safety, sequencing.    Stairs Stairs: Yes Stairs assistance: Min assist Stair Management: One rail Right   General stair comments: practiced sideways with 1 rail, up 2 then up 3. multimodal cues for safety and to remind patient of her step situation.   Wheelchair Mobility     Tilt Bed    Modified Rankin (Stroke Patients Only)       Balance Overall balance assessment: Needs assistance Sitting-balance support: Feet supported Sitting balance-Leahy Scale: Good     Standing balance support: During functional activity, Bilateral upper extremity supported, Reliant on assistive device for balance                                Cognition  Exercises      General Comments        Pertinent Vitals/Pain      Home Living                          Prior Function            PT Goals (current goals can now be found in the care plan section) Progress towards PT goals: Progressing toward goals    Frequency    7X/week      PT Plan      Co-evaluation              AM-PAC PT "6 Clicks" Mobility   Outcome Measure    Help needed moving from lying on your back to sitting on the side of a flat bed without using bedrails?: A Little Help needed moving to and from a bed to a chair (including a wheelchair)?: A Little Help needed standing up from a chair using  your arms (e.g., wheelchair or bedside chair)?: A Little Help needed to walk in hospital room?: A Little Help needed climbing 3-5 steps with a railing? : A Lot 6 Click Score: 14    End of Session Equipment Utilized During Treatment: Gait belt;Left knee immobilizer Activity Tolerance: Patient tolerated treatment well;Patient limited by pain Patient left:  on bed edge with RN for medication Nurse Communication: Mobility status PT Visit Diagnosis: Muscle weakness (generalized) (M62.81);Pain;Difficulty in walking, not elsewhere classified (R26.2);Other abnormalities of gait and mobility (R26.89) Pain - Right/Left: Left Pain - part of body: Knee     Time: 0924-1000 PT Time Calculation (min) (ACUTE ONLY): 36 min  Charges:    $Gait Training: 23-37 mins PT General Charges $$ ACUTE PT VISIT: 1 Visit                     Blanchard Kelch PT Acute Rehabilitation Services Office [phone] Weekend [phone]    Rada Hay 06/15/2023, 12:39 PM

## 2023-06-15 NOTE — Plan of Care (Signed)
  Problem: Activity: Goal: Ability to avoid complications of mobility impairment will improve Outcome: Adequate for Discharge Goal: Range of joint motion will improve Outcome: Adequate for Discharge   Problem: Pain Management: Goal: Pain level will decrease with appropriate interventions Outcome: Adequate for Discharge   Problem: Activity: Goal: Risk for activity intolerance will decrease Outcome: Adequate for Discharge   Problem: Elimination: Goal: Will not experience complications related to bowel motility Outcome: Adequate for Discharge   Problem: Pain Managment: Goal: General experience of comfort will improve Outcome: Adequate for Discharge   Problem: Safety: Goal: Ability to remain free from injury will improve Outcome: Adequate for Discharge   Problem: Acute Rehab PT Goals(only PT should resolve) Goal: Pt Will Go Supine/Side To Sit Outcome: Adequate for Discharge Goal: Pt Will Go Sit To Supine/Side Outcome: Adequate for Discharge Goal: Pt Will Transfer Bed To Chair/Chair To Bed Outcome: Adequate for Discharge Goal: Pt Will Ambulate Outcome: Adequate for Discharge Goal: Pt Will Go Up/Down Stairs Outcome: Adequate for Discharge   Problem: Acute Rehab PT Goals(only PT should resolve) Goal: Pt/caregiver will Perform Home Exercise Program Outcome: Adequate for Discharge   Discharge teaching done.  Written information given.

## 2023-06-15 NOTE — Plan of Care (Signed)
  Problem: Activity: Goal: Ability to avoid complications of mobility impairment will improve Outcome: Adequate for Discharge Goal: Range of joint motion will improve Outcome: Adequate for Discharge   Problem: Pain Management: Goal: Pain level will decrease with appropriate interventions Outcome: Adequate for Discharge   Problem: Activity: Goal: Risk for activity intolerance will decrease Outcome: Adequate for Discharge   Problem: Elimination: Goal: Will not experience complications related to bowel motility Outcome: Progressing   Problem: Pain Managment: Goal: General experience of comfort will improve Outcome: Progressing   Problem: Safety: Goal: Ability to remain free from injury will improve Outcome: Progressing

## 2023-06-15 NOTE — Discharge Summary (Signed)
Patient ID: Tammy Graham MRN: [id number] DOB/AGE: 05-17-1951 72 y.o.  Admit date: 06/09/2023 Discharge date: 06/15/2023  Admission Diagnoses:  Principal Problem:   Unilateral primary osteoarthritis, left knee Active Problems:   Status post total left knee replacement   Discharge Diagnoses:  Same  Past Medical History:  Diagnosis Date   Anxiety    Arthritis    shoulder and knees   CAD (coronary artery disease)    Complication of anesthesia    states she was still awake one time when surgery started   DM (diabetes mellitus) (HCC)    Family history of adverse reaction to anesthesia    tooks alot to put to sleep, slow to awaken.   HTN (hypertension)    Hyperlipemia    Myocardial infarction [facility])    Peripheral vascular disease (HCC)    Pneumonia    Sleep apnea    no cpap   Stroke (HCC)    2012, no residual    Surgeries: Procedure(s): LEFT TOTAL KNEE ARTHROPLASTY on 06/09/2023   Consultants:   Discharged Condition: Improved  Hospital Course: SAPNA FLORENCE is an 72 y.o. female who was admitted 06/09/2023 for operative treatment ofUnilateral primary osteoarthritis, left knee. Patient has severe unremitting pain that affects sleep, daily activities, and work/hobbies. After pre-op clearance the patient was taken to the operating room on 06/09/2023 and underwent  Procedure(s): LEFT TOTAL KNEE ARTHROPLASTY.    Patient was given perioperative antibiotics:  Anti-infectives (From admission, onward)    Start     Dose/Rate Route Frequency Ordered Stop   06/14/23 1200  fluconazole (DIFLUCAN) tablet 150 mg        150 mg Oral Daily 06/14/23 0937 06/14/23 1347   06/09/23 1400  ceFAZolin (ANCEF) IVPB 1 g/50 mL premix        1 g 100 mL/hr over 30 Minutes Intravenous Every 6 hours 06/09/23 1033 06/09/23 2155   06/09/23 0600  ceFAZolin (ANCEF) IVPB 2g/100 mL premix        2 g 200 mL/hr over 30 Minutes Intravenous On call to O.R. 06/09/23 4098 06/09/23 0748        Patient was  given sequential compression devices, early ambulation, and chemoprophylaxis to prevent DVT.  Patient benefited maximally from hospital stay and there were no complications.    Recent vital signs: Patient Vitals for the past 24 hrs:  BP Temp Temp src Pulse Resp SpO2  06/15/23 0648 122/71 -- -- 75 17 95 %  06/14/23 2115 125/78 99.6 F (37.6 C) Oral 74 17 96 %  06/14/23 1347 128/74 98.6 F (37 C) Oral 77 20 95 %     Recent laboratory studies: No results for input(s): "WBC", "HGB", "HCT", "PLT", "NA", "K", "CL", "CO2", "BUN", "CREATININE", "GLUCOSE", "INR", "CALCIUM" in the last 72 hours.  Invalid input(s): "PT", "2"   Discharge Medications:   Allergies as of 06/15/2023       Reactions   Chlorthalidone Other (See Comments)   headache   Plavix [clopidogrel Bisulfate] Itching   Rosuvastatin Hives   Patient is allergic to generic rosuvastatin. She says she is able to take branded Crestor.    Simvastatin Other (See Comments)   unknown   Benadryl [diphenhydramine Hcl (sleep)] Rash        Medication List     STOP taking these medications    aspirin EC 81 MG tablet Replaced by: aspirin 81 MG chewable tablet       TAKE these medications    aspirin 81 MG  chewable tablet Chew 1 tablet (81 mg total) by mouth 2 (two) times daily. Replaces: aspirin EC 81 MG tablet   atorvastatin 20 MG tablet Commonly known as: LIPITOR Take 1 tablet (20 mg total) by mouth daily.   carvedilol 12.5 MG tablet Commonly known as: COREG TAKE 1 TABLET(12.5 MG) BY MOUTH TWICE DAILY WITH A MEAL   chlorthalidone 25 MG tablet Commonly known as: HYGROTON Take 1 tablet (25 mg total) by mouth daily.   Farxiga 10 MG Tabs tablet Generic drug: dapagliflozin propanediol Take 1 tablet (10 mg total) by mouth daily before breakfast.   fluconazole 150 MG tablet Commonly known as: DIFLUCAN TAKE 1 TABLET BY MOUTH DAILY What changed:  when to take this reasons to take this   glipiZIDE 10 MG 24 hr  tablet Commonly known as: GLUCOTROL XL TAKE 1 TABLET(10 MG) BY MOUTH DAILY WITH BREAKFAST* DISCONTINUE IMMEDIATE RELEASE GLIPIZIDE*   Misc. Devices Misc Shower bench, Above the toilet seat.  Diagnosis- Type 2 Diabetes Mellitus   Misc. Devices Misc Blood pressure monitor.  Diagnosis hypertension   multivitamin with minerals Tabs tablet Take 1 tablet by mouth in the morning.   nicotine 14 mg/24hr patch Commonly known as: Nicoderm CQ Place 1 patch (14 mg total) onto the skin daily.   onetouch ultrasoft lancets Use to test blood sugar 3 times daily   OneTouch Verio test strip Generic drug: glucose blood Use to test blood sugar 3 times daily   OneTouch Verio w/Device Kit Use to test blood sugar 3 times daily   oxyCODONE 5 MG immediate release tablet Commonly known as: Oxy IR/ROXICODONE Take 1-2 tablets (5-10 mg total) by mouth every 4 (four) hours as needed for moderate pain (pain score 4-6).   valsartan 320 MG tablet Commonly known as: DIOVAN Take 1 tablet (320 mg total) by mouth daily.   VISINE OP Place 1 drop into both eyes daily.               Durable Medical Equipment  (From admission, onward)           Start     Ordered   06/09/23 1034  DME 3 n 1  Once        06/09/23 1033   06/09/23 1034  DME Walker rolling  Once       Question Answer Comment  Walker: With 5 Inch Wheels   Patient needs a walker to treat with the following condition Status post total left knee replacement      06/09/23 1033            Diagnostic Studies: DG Knee Left Port  Result Date: 06/09/2023 CLINICAL DATA:  Status post knee replacement. EXAM: PORTABLE LEFT KNEE - 1-2 VIEW COMPARISON:  None Available. FINDINGS: Left knee arthroplasty in expected alignment. No periprosthetic lucency or fracture. There has been patellar resurfacing. Recent postsurgical change includes air and edema in the soft tissues and joint space. Anterior skin staples in place. IMPRESSION: Left knee  arthroplasty without immediate postoperative complication. Electronically Signed   By: Narda Rutherford M.D.   On: 06/09/2023 10:47    Disposition: Discharge disposition: 01-Home or Self Care          Contact information for follow-up providers     Kathryne Hitch, MD Follow up in 2 week(s).   Specialty: Orthopedic Surgery Contact information: [address] [place] [address] [phone]              Contact information for after-discharge  care     Destination     [facility] Preferred SNF .   Service: Skilled Nursing Contact information: [address] [place] [address] [phone]                      Signed: Kathryne Hitch 06/15/2023, 7:28 AM

## 2023-06-15 NOTE — TOC Transition Note (Signed)
Transition of Care [facility]) - CM/SW Discharge Note   Patient Details  Name: Tammy Graham MRN: [id number] Date of Birth: 1951-08-17  [facility] CM/SW Contact:  Amada Jupiter, LCSW Phone Number: 06/15/2023, 2:46 PM   Clinical Narrative:     Met with pt today and noted that insurance authorization has still not been received by SNF.  Pt states she will plan to discharge home as she has support needed there.  She has confirmed with family that she has a RW in the home and she has discharge transportation.  Have alerted [facility] that plan now changed back to home with [facility].  Pt to discharge home this afternoon.  No further TOC needs.  Final next level of care: Home w Home Health Services Barriers to Discharge: Barriers Resolved   Patient Goals and CMS Choice CMS Medicare.gov Compare Post Acute Care list provided to:: Patient Choice offered to / list presented to : Patient  Discharge Placement                         Discharge Plan and Services Additional resources added to the After Visit Summary for   In-house Referral: Clinical Social Work   Post Acute Care Choice: Skilled Nursing Facility          DME Arranged: N/A DME Agency: NA       HH Arranged: PT HH Agency: [facility]        Social Determinants of Health (SDOH) Interventions SDOH Screenings   Food Insecurity: No Food Insecurity (06/09/2023)  Housing: Low Risk  (06/09/2023)  Transportation Needs: No Transportation Needs (06/09/2023)  Utilities: Not At Risk (06/09/2023)  Depression (PHQ2-9): Low Risk  (05/27/2023)  Social Connections: Unknown (03/18/2023)   Received from [facility]  Tobacco Use: High Risk (06/09/2023)     Readmission Risk Interventions    03/06/2021    1:59 PM  Readmission Risk Prevention Plan  Post Dischage Appt Complete  Medication Screening Complete  Transportation Screening Complete

## 2023-06-15 NOTE — Progress Notes (Signed)
Physical Therapy Treatment Patient Details Name: Tammy Graham MRN: [id number] DOB: 08-15-1951 Today's Date: 06/15/2023   History of Present Illness Pt is a 72 y/o F admitted on 06/09/23 for scheduled L TKA. PMH: PAD, DM2, dyslipidemia, essential HTN, tobacco use, anxiety, CAD    PT Comments   Patient is unable to perform a SLR, not also knee is flexed , did stretch when performing quad sets.  Patient  instructed that if she ambulates without KI that she is to be very  cognizant that the knee may buckle although did not note any buckling of the Left knee when ambulated in room without KI.Encouraged  patient to wear the KI , especially on steps.  Patient  stating desire to Dc home, states that she will get her RW from son's home.    If plan is discharge home, recommend the following: A little help with walking and/or transfers;A little help with bathing/dressing/bathroom;Assistance with cooking/housework;Assist for transportation;Help with stairs or ramp for entrance   Can travel by private vehicle        Equipment Recommendations  None recommended by PT    Recommendations for Other Services       Precautions / Restrictions Precautions Precautions: Fall;Knee Precaution Comments: had patient ambulate without KI to assess safety and have pt. Experience ambulation without KI Restrictions LLE Weight Bearing: Weight bearing as tolerated     Mobility  Bed Mobility   Bed Mobility: Sit to Supine          General bed mobility comments: Pt used her UEs and  belt to self move the left leg onto bed    Transfers Overall transfer level: Needs assistance Equipment used: Rolling walker (2 wheels) Transfers: Sit to/from Stand Sit to Stand: Contact guard assist Stand pivot transfers: Contact guard assist         General transfer comment: Cues for safety, technique,hand/LE placement. Close guard for safety. Increased time.    Ambulation/Gait Ambulation/Gait assistance: Contact  guard assist Gait Distance (Feet): 20 Feet (x 2) Assistive device: Rolling walker (2 wheels) Gait Pattern/deviations: Step-to pattern, Antalgic Gait velocity: decreased     General Gait Details: Cues for safety, sequencing.  Ambulated to and from BR without KI, L knee did not buckle, noted the knee in flexed position.   Stairs Stairs: Yes Stairs assistance: Min assist Stair Management: One rail Right   General stair comments: practiced sideways with 1 rail, up 2 then up 3. multimodal cues for safety and to remind patient of her step situation.   Wheelchair Mobility     Tilt Bed    Modified Rankin (Stroke Patients Only)       Balance Overall balance assessment: Needs assistance Sitting-balance support: Feet supported Sitting balance-Leahy Scale: Good     Standing balance support: During functional activity, Bilateral upper extremity supported, Reliant on assistive device for balance                                Cognition Arousal: Alert                                              Exercises Total Joint Exercises Ankle Circles/Pumps: AROM, Both, 10 reps Quad Sets: AROM, Left, 10 reps, Supine Hip ABduction/ADduction: AAROM, Left, 10 reps, Supine Straight Leg Raises: AAROM, Left, 10  reps, Supine Long Arc Quad: AROM, Seated, Strengthening, Left, 10 reps Goniometric ROM: 5-60 left knee    General Comments        Pertinent Vitals/Pain Pain Assessment Pain Assessment: Faces Faces Pain Scale: Hurts whole lot Pain Location: L knee with activity Pain Descriptors / Indicators: Grimacing, Guarding, Discomfort Pain Intervention(s): Patient requesting pain meds-RN notified, Monitored during session, Ice applied    Home Living                          Prior Function            PT Goals (current goals can now be found in the care plan section) Progress towards PT goals: Progressing toward goals    Frequency     7X/week      PT Plan      Co-evaluation              AM-PAC PT "6 Clicks" Mobility   Outcome Measure  Help needed turning from your back to your side while in a flat bed without using bedrails?: None Help needed moving from lying on your back to sitting on the side of a flat bed without using bedrails?: A Little Help needed moving to and from a bed to a chair (including a wheelchair)?: A Little Help needed standing up from a chair using your arms (e.g., wheelchair or bedside chair)?: A Little Help needed to walk in hospital room?: A Little Help needed climbing 3-5 steps with a railing? : A Little 6 Click Score: 19    End of Session Equipment Utilized During Treatment: Gait belt Activity Tolerance: Patient tolerated treatment well Patient left: in bed;with nursing/sitter in room;with bed alarm set Nurse Communication: Mobility status PT Visit Diagnosis: Muscle weakness (generalized) (M62.81);Pain;Difficulty in walking, not elsewhere classified (R26.2);Other abnormalities of gait and mobility (R26.89) Pain - Right/Left: Left Pain - part of body: Knee     Time: 1010-1030 PT Time Calculation (min) (ACUTE ONLY): 20 min  Charges:    $Gait Training: 8-22 mins PT General Charges $$ ACUTE PT VISIT: 1 Visit                     Blanchard Kelch PT Acute Rehabilitation Services Office [phone] Weekend [phone]    Rada Hay 06/15/2023, 1:00 PM

## 2023-06-15 NOTE — Progress Notes (Signed)
Patient ID: Tammy Graham, female   DOB: [birth year], 72 y.o.   MRN: [id number] The patient's vital signs are stable and her left knee is stable.  She can be discharged today.  She states now she would rather go home and to skilled nursing unless somehow that can be set up for today.  I know she was waiting for authorization for skilled nursing.  She says she has support at home as well as a ride but is really hard to tell sometimes with talking with her.  Discharge should be today though.  I will go ahead and put in discharge orders.  If things change in terms of the destination, I can be contacted through secure chat.

## 2023-06-16 ENCOUNTER — Telehealth: Payer: Self-pay | Admitting: Family Medicine

## 2023-06-16 ENCOUNTER — Telehealth: Payer: Self-pay | Admitting: Orthopaedic Surgery

## 2023-06-16 ENCOUNTER — Telehealth: Payer: Self-pay

## 2023-06-16 DIAGNOSIS — M19012 Primary osteoarthritis, left shoulder: Secondary | ICD-10-CM | POA: Diagnosis not present

## 2023-06-16 DIAGNOSIS — F419 Anxiety disorder, unspecified: Secondary | ICD-10-CM | POA: Diagnosis not present

## 2023-06-16 DIAGNOSIS — R300 Dysuria: Secondary | ICD-10-CM | POA: Diagnosis not present

## 2023-06-16 DIAGNOSIS — Z7982 Long term (current) use of aspirin: Secondary | ICD-10-CM | POA: Diagnosis not present

## 2023-06-16 DIAGNOSIS — Z96652 Presence of left artificial knee joint: Secondary | ICD-10-CM | POA: Diagnosis not present

## 2023-06-16 DIAGNOSIS — M1711 Unilateral primary osteoarthritis, right knee: Secondary | ICD-10-CM | POA: Diagnosis not present

## 2023-06-16 DIAGNOSIS — I252 Old myocardial infarction: Secondary | ICD-10-CM | POA: Diagnosis not present

## 2023-06-16 DIAGNOSIS — E1151 Type 2 diabetes mellitus with diabetic peripheral angiopathy without gangrene: Secondary | ICD-10-CM | POA: Diagnosis not present

## 2023-06-16 DIAGNOSIS — I10 Essential (primary) hypertension: Secondary | ICD-10-CM | POA: Diagnosis not present

## 2023-06-16 DIAGNOSIS — E782 Mixed hyperlipidemia: Secondary | ICD-10-CM | POA: Diagnosis not present

## 2023-06-16 DIAGNOSIS — G4730 Sleep apnea, unspecified: Secondary | ICD-10-CM | POA: Diagnosis not present

## 2023-06-16 DIAGNOSIS — I2510 Atherosclerotic heart disease of native coronary artery without angina pectoris: Secondary | ICD-10-CM | POA: Diagnosis not present

## 2023-06-16 DIAGNOSIS — B351 Tinea unguium: Secondary | ICD-10-CM | POA: Diagnosis not present

## 2023-06-16 DIAGNOSIS — Z471 Aftercare following joint replacement surgery: Secondary | ICD-10-CM | POA: Diagnosis not present

## 2023-06-16 DIAGNOSIS — Z7984 Long term (current) use of oral hypoglycemic drugs: Secondary | ICD-10-CM | POA: Diagnosis not present

## 2023-06-16 DIAGNOSIS — M19011 Primary osteoarthritis, right shoulder: Secondary | ICD-10-CM | POA: Diagnosis not present

## 2023-06-16 NOTE — Telephone Encounter (Signed)
Patients daughter Martie Lee called stated her mother fell in the hospital before her discharge and she drove herself home with a total knee replacement. She was denied services [facility]. Daughter is asking the provider to help get patient in a rehab facility as she can not care for herself. Please f/u with daughter at [phone]

## 2023-06-16 NOTE — Telephone Encounter (Signed)
The call to Martie Lee is documented in another telephone encounter from today

## 2023-06-16 NOTE — Telephone Encounter (Signed)
I spoke to [place] and she clarified that she is not the patient's daughter. The patient's daughter died and she Martie Lee) is a good friend.    She and her mother are trying to assist the patient now; but she Martie Lee) lives in [place] area and is not planning to stay in [place].  Martie Lee voiced her concerns about the patient being alone and at this time. She is not able to make her own meals or get around the house safely.  Martie Lee said that the patient drove herself home from the hospital and a neighbor had to assist her getting into her home.  Martie Lee would like the patient placed in a facility.  She said she understands that the insurance is an issue and is wondering if a [facility] facility is an option. I told her that I would check with the hospital SW about [facility].  I also told her that I would check with [facility] as well as another local facility however, I am not able to recommend a facility.  Martie Lee appreciated the help and informed me that the home health PT will be out to the house between 1500-1700 today. She also stated that the orthopedic surgeon's office has been contacted about the concerns and she was told that if placement is not an option, she may need to take the patient back to the hospital and have her admitted for placement.   Email sent to Lollie Sails, Admissions - [facility] inquiring if there is any chance patient can be admitted. Email also sent to [facility] inquiring about possible admission.

## 2023-06-16 NOTE — Transitions of Care (Post Inpatient/ED Visit) (Signed)
06/16/2023  Name: Tammy Graham MRN: [id number] DOB: [birth year]  Today's TOC FU Call Status: Today's TOC FU Call Status:: Successful TOC FU Call Completed TOC FU Call Complete Date: 06/16/23 Patient's Name and Date of Birth confirmed.  Transition Care Management Follow-up Telephone Call Date of Discharge: 06/15/23 Discharge Facility: [facility]) Type of Discharge: Inpatient Admission Primary Inpatient Discharge Diagnosis:: s/p left TKA How have you been since you were released from the hospital?: Worse (She said she is just tired and " hurting bad."  She has not get receivng her pain medication and she said someone is picking  the medication up for her today.) Any questions or concerns?: Yes Patient Questions/Concerns:: She is very upset that she was sent home. She said she feels like she was just pushed out of the hospital . She lives alone and has no family to help her. She said  her daughter;'s friend is coming from [place] to see if she can get her into a facility.  She said she cannot stay home alone another night.  I told her to have her friend call me wiht any questions when she arrives. The patient also stated that she needs a RW and only has a cane and rollator. She is not  supposed to be using the cane and the rollator is too big for her doorways. She wants a standard RW and said that her friend called the surgeon's office about this already.  She could also use a BSC. Patient Questions/Concerns Addressed: Notified Provider of Patient Questions/Concerns, Other: (message sent to Amada Jupiter, LCSW notifying her of patiet's concerns about being discharged home)  Items Reviewed: Did you receive and understand the discharge instructions provided?: Yes Medications obtained,verified, and reconciled?: Partial Review Completed Reason for Partial Mediation Review: She said she just got up and has not reviewed the medications yet so she is not sure what she has. She stated she is in alot  of pain and is waiting for her pain medication and did not want to review the list with me Any new allergies since your discharge?: No Dietary orders reviewed?: No Do you have support at home?: No (She said she lives alone and has no help. Her daughter's beste friend, from [place], is on her way to see her now and find out how she can help.)  Medications Reviewed Today: Medications Reviewed Today   Medications were not reviewed in this encounter     Home Care and Equipment/Supplies: Were Home Health Services Ordered?: Yes Name of Home Health Agency:: [facility] Has Agency set up a time to come to your home?: No EMR reviewed for Home Health Orders:  (She said the agency called today but she was not given an appointment) Any new equipment or medical supplies ordered?: No  Functional Questionnaire: Do you need assistance with bathing/showering or dressing?: Yes (She is not sure who will be helping her..  She wants to be in a facility) Do you need assistance with meal preparation?: Yes (She is ot sure who will be assisting) Do you need assistance with eating?: No Do you have difficulty maintaining continence: No (She does have difficulty getting to the bathroom and could benefit from a BSC) Do you need assistance with getting out of bed/getting out of a chair/moving?: Yes Do you have difficulty managing or taking your medications?: No (this was not addressed.. She said she was too tired)  Follow up appointments reviewed: PCP Follow-up appointment confirmed?: NA Specialist Hospital Follow-up appointment confirmed?: Yes  Date of Specialist follow-up appointment?: 06/24/23 Follow-Up Specialty Provider:: orthopedic surgeon Do you need transportation to your follow-up appointment?: Yes Transportation Need Intervention Addressed By:: Other: (She is not sure how she will get to her appointment. She is just wants to go to a facility.) Do you understand care options if your condition(s) worsen?:  Yes-patient verbalized understanding    SIGNATURE. Robyne Peers, RN

## 2023-06-16 NOTE — Telephone Encounter (Signed)
Pt had total knee replacement on 06/09/23 and was told her was denied from [facility] for inpatient rehab, she was discharged home and lives alone no one lives with her and she cannot navigate in her home alone. Please advise call Martie Lee (daughter) [phone] with details

## 2023-06-16 NOTE — Telephone Encounter (Signed)
Message received from [facility] stating that the [place] policy is not active

## 2023-06-16 NOTE — Telephone Encounter (Signed)
I received a call back from [facility]. She explained that they had problems with obtaining insurance authorizations from th [employer] and it was determined that one plan is inactive and the other plan does not have SNF benefits.  She was not aware of her [facility] plan and I provided her with that number. She said she would check [employer] coverage for SNF and get back to me.

## 2023-06-16 NOTE — Telephone Encounter (Signed)
I spoke to [facility] [place] and she explained how they worked with the patient to determine the plan to send her home because her insurance had not authorized inpatient rehab.  She also noted that  the patient was in agreement with this plan to go home  She also said that they offered to order a RW for the patient but she said she had one at home.    Tammy Graham stated she would contact [facility] and request that the PT make a home visit today.

## 2023-06-16 NOTE — Telephone Encounter (Signed)
Martie Lee called in stating she had a few more questions for Tammy Graham please advise

## 2023-06-16 NOTE — Telephone Encounter (Signed)
Daughter Tammy Graham called back stated she has a couple of questions for the case manager as she is still trying to get her mother in a inpatient rehab facility asap. Patient is not mobile enough to be left alone as she almost fell going to the bathroom and she lives alone. Please f/u with daughter

## 2023-06-16 NOTE — Telephone Encounter (Signed)
Daughter said she is working on community help

## 2023-06-16 NOTE — Telephone Encounter (Signed)
FYI- From the  [facility] call:  She said she is just tired and " hurting bad."  She has not yet receivng her pain medication and she said someone is picking  the medication up for her today.  She is very upset that she was sent home. She said she feels like she was just pushed out of the hospital . She lives alone and has no family to help her. She said  her daughter;'s friend is coming from [place] to see if she can get her into a facility.  She said she cannot stay home alone another night.  I told her to have her friend call me wiht any questions when she arrives.   The patient also stated that she needs a RW and only has a cane and rollator. She is not  supposed to be using the cane and the rollator is too big for her doorways. She wants a standard RW and said that her friend called the surgeon's office about this already.  She could also use a BSC.   She said she just got up and has not reviewed the medications yet so she is not sure what she has. She stated she is in alot of pain and is waiting for her pain medication and did not want to review the list with me

## 2023-06-16 NOTE — Telephone Encounter (Signed)
TOC call placed with Erskine Squibb, RN, CM.  Will forward to f/u with message.

## 2023-06-17 ENCOUNTER — Telehealth: Payer: Self-pay | Admitting: Orthopaedic Surgery

## 2023-06-17 ENCOUNTER — Other Ambulatory Visit: Payer: Self-pay | Admitting: Family Medicine

## 2023-06-17 NOTE — Telephone Encounter (Signed)
Alex needs verbal order for HHPT, also OT and HH aide if you think appropriate. S/p left TKA Call back # [phone]

## 2023-06-17 NOTE — Telephone Encounter (Signed)
Okay thanks for the update. It looks like ortho will be handling this but let me know if there is anything additional I need To do. Thanks

## 2023-06-17 NOTE — Telephone Encounter (Signed)
The conversation is continued in another telephone encounter from today

## 2023-06-17 NOTE — Telephone Encounter (Signed)
Th following message was received from Tammy Graham, A-R Specialist with [name]: Unfortunately, Tammy Graham does not have any Medicare A days remaining to use in a Skilled Nursing facility.  She has not had a 60-day period free of skilled care due to prior nursing home and hospital days, therefore her Medicare A days will not reset.  The only option for SNF coverage would be private pay for room and board. We could then provide therapy under Medicare B, but she would be responsible for the 20% co-insurance privately for all therapy.  The patient does not have Medicare B per our records.   I called patient's friend, Tammy Graham, and informed her of the message from [name].  At this time, SNF placement is not an option.  Tammy Graham said she understood. She is trying to get patient set up in the home to make easy access throughout the home before she returns to [name].  She also said that the PT session yesterday went well.  The patient now has her pain meds. The PT also told them that they can set up for a aide to come to assist/ check on patient a few days a week.  Tammy Graham is planning to hear from him today with an update on the plan. Tammy Graham said she will plan to come back to check on her on her days.off.  I told Tammy Graham to please call me with any additional questions and she said she would.

## 2023-06-17 NOTE — Telephone Encounter (Signed)
FYI Looks like pt was finally able to get HHPT. I gave verbal ok for this for the next 6 weeks. She is scheduled to see you next week for 1st postop

## 2023-06-18 ENCOUNTER — Telehealth: Payer: Self-pay | Admitting: Pharmacist

## 2023-06-18 DIAGNOSIS — I252 Old myocardial infarction: Secondary | ICD-10-CM | POA: Diagnosis not present

## 2023-06-18 DIAGNOSIS — B351 Tinea unguium: Secondary | ICD-10-CM | POA: Diagnosis not present

## 2023-06-18 DIAGNOSIS — Z471 Aftercare following joint replacement surgery: Secondary | ICD-10-CM | POA: Diagnosis not present

## 2023-06-18 DIAGNOSIS — Z7982 Long term (current) use of aspirin: Secondary | ICD-10-CM | POA: Diagnosis not present

## 2023-06-18 DIAGNOSIS — E1151 Type 2 diabetes mellitus with diabetic peripheral angiopathy without gangrene: Secondary | ICD-10-CM | POA: Diagnosis not present

## 2023-06-18 DIAGNOSIS — M19011 Primary osteoarthritis, right shoulder: Secondary | ICD-10-CM | POA: Diagnosis not present

## 2023-06-18 DIAGNOSIS — Z7984 Long term (current) use of oral hypoglycemic drugs: Secondary | ICD-10-CM | POA: Diagnosis not present

## 2023-06-18 DIAGNOSIS — I10 Essential (primary) hypertension: Secondary | ICD-10-CM | POA: Diagnosis not present

## 2023-06-18 DIAGNOSIS — R300 Dysuria: Secondary | ICD-10-CM | POA: Diagnosis not present

## 2023-06-18 DIAGNOSIS — M1711 Unilateral primary osteoarthritis, right knee: Secondary | ICD-10-CM | POA: Diagnosis not present

## 2023-06-18 DIAGNOSIS — M19012 Primary osteoarthritis, left shoulder: Secondary | ICD-10-CM | POA: Diagnosis not present

## 2023-06-18 DIAGNOSIS — I2510 Atherosclerotic heart disease of native coronary artery without angina pectoris: Secondary | ICD-10-CM | POA: Diagnosis not present

## 2023-06-18 DIAGNOSIS — F419 Anxiety disorder, unspecified: Secondary | ICD-10-CM | POA: Diagnosis not present

## 2023-06-18 DIAGNOSIS — Z96652 Presence of left artificial knee joint: Secondary | ICD-10-CM | POA: Diagnosis not present

## 2023-06-18 DIAGNOSIS — G4730 Sleep apnea, unspecified: Secondary | ICD-10-CM | POA: Diagnosis not present

## 2023-06-18 DIAGNOSIS — E782 Mixed hyperlipidemia: Secondary | ICD-10-CM | POA: Diagnosis not present

## 2023-06-18 NOTE — Telephone Encounter (Signed)
Called patient to schedule an appointment for BP check. She had a Ieft total knee replacement 06/09/2023 and does not feel up to coming in. She is scheduled to see her Cardiologist in October. She does not need any refills at this time.   Butch Penny, PharmD, P[place]y [place], CPP Clinical Pharmacist [facility] [phone]

## 2023-06-19 DIAGNOSIS — Z7984 Long term (current) use of oral hypoglycemic drugs: Secondary | ICD-10-CM | POA: Diagnosis not present

## 2023-06-19 DIAGNOSIS — I10 Essential (primary) hypertension: Secondary | ICD-10-CM | POA: Diagnosis not present

## 2023-06-19 DIAGNOSIS — E782 Mixed hyperlipidemia: Secondary | ICD-10-CM | POA: Diagnosis not present

## 2023-06-19 DIAGNOSIS — R300 Dysuria: Secondary | ICD-10-CM | POA: Diagnosis not present

## 2023-06-19 DIAGNOSIS — Z7982 Long term (current) use of aspirin: Secondary | ICD-10-CM | POA: Diagnosis not present

## 2023-06-19 DIAGNOSIS — M19011 Primary osteoarthritis, right shoulder: Secondary | ICD-10-CM | POA: Diagnosis not present

## 2023-06-19 DIAGNOSIS — F419 Anxiety disorder, unspecified: Secondary | ICD-10-CM | POA: Diagnosis not present

## 2023-06-19 DIAGNOSIS — I2510 Atherosclerotic heart disease of native coronary artery without angina pectoris: Secondary | ICD-10-CM | POA: Diagnosis not present

## 2023-06-19 DIAGNOSIS — M19012 Primary osteoarthritis, left shoulder: Secondary | ICD-10-CM | POA: Diagnosis not present

## 2023-06-19 DIAGNOSIS — Z471 Aftercare following joint replacement surgery: Secondary | ICD-10-CM | POA: Diagnosis not present

## 2023-06-19 DIAGNOSIS — Z96652 Presence of left artificial knee joint: Secondary | ICD-10-CM | POA: Diagnosis not present

## 2023-06-19 DIAGNOSIS — G4730 Sleep apnea, unspecified: Secondary | ICD-10-CM | POA: Diagnosis not present

## 2023-06-19 DIAGNOSIS — B351 Tinea unguium: Secondary | ICD-10-CM | POA: Diagnosis not present

## 2023-06-19 DIAGNOSIS — M1711 Unilateral primary osteoarthritis, right knee: Secondary | ICD-10-CM | POA: Diagnosis not present

## 2023-06-19 DIAGNOSIS — I252 Old myocardial infarction: Secondary | ICD-10-CM | POA: Diagnosis not present

## 2023-06-19 DIAGNOSIS — E1151 Type 2 diabetes mellitus with diabetic peripheral angiopathy without gangrene: Secondary | ICD-10-CM | POA: Diagnosis not present

## 2023-06-23 ENCOUNTER — Telehealth: Payer: Self-pay | Admitting: Orthopaedic Surgery

## 2023-06-23 ENCOUNTER — Other Ambulatory Visit: Payer: Self-pay | Admitting: Orthopaedic Surgery

## 2023-06-23 ENCOUNTER — Encounter (HOSPITAL_BASED_OUTPATIENT_CLINIC_OR_DEPARTMENT_OTHER): Payer: Self-pay

## 2023-06-23 DIAGNOSIS — M19012 Primary osteoarthritis, left shoulder: Secondary | ICD-10-CM | POA: Diagnosis not present

## 2023-06-23 DIAGNOSIS — Z7984 Long term (current) use of oral hypoglycemic drugs: Secondary | ICD-10-CM | POA: Diagnosis not present

## 2023-06-23 DIAGNOSIS — Z7982 Long term (current) use of aspirin: Secondary | ICD-10-CM | POA: Diagnosis not present

## 2023-06-23 DIAGNOSIS — I2510 Atherosclerotic heart disease of native coronary artery without angina pectoris: Secondary | ICD-10-CM | POA: Diagnosis not present

## 2023-06-23 DIAGNOSIS — I10 Essential (primary) hypertension: Secondary | ICD-10-CM | POA: Diagnosis not present

## 2023-06-23 DIAGNOSIS — B351 Tinea unguium: Secondary | ICD-10-CM | POA: Diagnosis not present

## 2023-06-23 DIAGNOSIS — E1151 Type 2 diabetes mellitus with diabetic peripheral angiopathy without gangrene: Secondary | ICD-10-CM | POA: Diagnosis not present

## 2023-06-23 DIAGNOSIS — E782 Mixed hyperlipidemia: Secondary | ICD-10-CM | POA: Diagnosis not present

## 2023-06-23 DIAGNOSIS — R300 Dysuria: Secondary | ICD-10-CM | POA: Diagnosis not present

## 2023-06-23 DIAGNOSIS — Z471 Aftercare following joint replacement surgery: Secondary | ICD-10-CM | POA: Diagnosis not present

## 2023-06-23 DIAGNOSIS — M1711 Unilateral primary osteoarthritis, right knee: Secondary | ICD-10-CM | POA: Diagnosis not present

## 2023-06-23 DIAGNOSIS — G4730 Sleep apnea, unspecified: Secondary | ICD-10-CM | POA: Diagnosis not present

## 2023-06-23 DIAGNOSIS — I252 Old myocardial infarction: Secondary | ICD-10-CM | POA: Diagnosis not present

## 2023-06-23 DIAGNOSIS — Z96652 Presence of left artificial knee joint: Secondary | ICD-10-CM | POA: Diagnosis not present

## 2023-06-23 DIAGNOSIS — M19011 Primary osteoarthritis, right shoulder: Secondary | ICD-10-CM | POA: Diagnosis not present

## 2023-06-23 DIAGNOSIS — F419 Anxiety disorder, unspecified: Secondary | ICD-10-CM | POA: Diagnosis not present

## 2023-06-23 MED ORDER — OXYCODONE HCL 5 MG PO TABS: 5.0000 mg | ORAL_TABLET | Freq: Four times a day (QID) | ORAL | 0 refills | Status: DC | PRN

## 2023-06-23 NOTE — Telephone Encounter (Signed)
Pt requesting Oxycodone refill, please advise

## 2023-06-24 ENCOUNTER — Encounter: Payer: Self-pay | Admitting: Orthopaedic Surgery

## 2023-06-24 ENCOUNTER — Ambulatory Visit (INDEPENDENT_AMBULATORY_CARE_PROVIDER_SITE_OTHER): Payer: BC Managed Care – PPO | Admitting: Orthopaedic Surgery

## 2023-06-24 DIAGNOSIS — Z96652 Presence of left artificial knee joint: Secondary | ICD-10-CM

## 2023-06-24 MED ORDER — OXYCODONE HCL 5 MG PO TABS: 5.0000 mg | ORAL_TABLET | Freq: Four times a day (QID) | ORAL | 0 refills | Status: DC | PRN

## 2023-06-24 MED ORDER — TIZANIDINE HCL 2 MG PO TABS: 2.0000 mg | ORAL_TABLET | Freq: Three times a day (TID) | ORAL | 1 refills | Status: AC | PRN

## 2023-06-24 NOTE — Progress Notes (Signed)
The patient is here today for her first postoperative visit status post a left total knee replacement.  She is ambulating with a walker.  She does states she needs a muscle relaxant and a pain medicine refill.  She has been having home health therapy but needs to transition to outpatient therapy.  She lives in [place] and states there is some therapy places near her.  I did give her a prescription for outpatient physical therapy and she knows she needs to contact 1 of these places to start outpatient physical therapy.  Examination of her left knee today shows the incision looks good.  Staples are removed and Steri-Strips applied.  She lacks full extension by 3 to 5 degrees and I can flex her to 90 degrees.  Her calf is soft.  I will send in some Zanaflex as well as oxycodone.  She needs to transition outpatient therapy as soon as possible for her left knee.  We will see her back in 4 weeks for repeat exam but no x-rays are needed.  All questions and concerns were addressed and answered.

## 2023-06-25 DIAGNOSIS — M19012 Primary osteoarthritis, left shoulder: Secondary | ICD-10-CM | POA: Diagnosis not present

## 2023-06-25 DIAGNOSIS — Z96652 Presence of left artificial knee joint: Secondary | ICD-10-CM | POA: Diagnosis not present

## 2023-06-25 DIAGNOSIS — B351 Tinea unguium: Secondary | ICD-10-CM | POA: Diagnosis not present

## 2023-06-25 DIAGNOSIS — I10 Essential (primary) hypertension: Secondary | ICD-10-CM | POA: Diagnosis not present

## 2023-06-25 DIAGNOSIS — G4730 Sleep apnea, unspecified: Secondary | ICD-10-CM | POA: Diagnosis not present

## 2023-06-25 DIAGNOSIS — Z7982 Long term (current) use of aspirin: Secondary | ICD-10-CM | POA: Diagnosis not present

## 2023-06-25 DIAGNOSIS — E1151 Type 2 diabetes mellitus with diabetic peripheral angiopathy without gangrene: Secondary | ICD-10-CM | POA: Diagnosis not present

## 2023-06-25 DIAGNOSIS — Z7984 Long term (current) use of oral hypoglycemic drugs: Secondary | ICD-10-CM | POA: Diagnosis not present

## 2023-06-25 DIAGNOSIS — F419 Anxiety disorder, unspecified: Secondary | ICD-10-CM | POA: Diagnosis not present

## 2023-06-25 DIAGNOSIS — M1711 Unilateral primary osteoarthritis, right knee: Secondary | ICD-10-CM | POA: Diagnosis not present

## 2023-06-25 DIAGNOSIS — Z471 Aftercare following joint replacement surgery: Secondary | ICD-10-CM | POA: Diagnosis not present

## 2023-06-25 DIAGNOSIS — E782 Mixed hyperlipidemia: Secondary | ICD-10-CM | POA: Diagnosis not present

## 2023-06-25 DIAGNOSIS — I252 Old myocardial infarction: Secondary | ICD-10-CM | POA: Diagnosis not present

## 2023-06-25 DIAGNOSIS — R300 Dysuria: Secondary | ICD-10-CM | POA: Diagnosis not present

## 2023-06-25 DIAGNOSIS — I2510 Atherosclerotic heart disease of native coronary artery without angina pectoris: Secondary | ICD-10-CM | POA: Diagnosis not present

## 2023-06-25 DIAGNOSIS — M19011 Primary osteoarthritis, right shoulder: Secondary | ICD-10-CM | POA: Diagnosis not present

## 2023-06-26 DIAGNOSIS — G4730 Sleep apnea, unspecified: Secondary | ICD-10-CM | POA: Diagnosis not present

## 2023-06-26 DIAGNOSIS — I10 Essential (primary) hypertension: Secondary | ICD-10-CM | POA: Diagnosis not present

## 2023-06-26 DIAGNOSIS — M1711 Unilateral primary osteoarthritis, right knee: Secondary | ICD-10-CM | POA: Diagnosis not present

## 2023-06-26 DIAGNOSIS — Z7984 Long term (current) use of oral hypoglycemic drugs: Secondary | ICD-10-CM | POA: Diagnosis not present

## 2023-06-26 DIAGNOSIS — Z96652 Presence of left artificial knee joint: Secondary | ICD-10-CM | POA: Diagnosis not present

## 2023-06-26 DIAGNOSIS — Z471 Aftercare following joint replacement surgery: Secondary | ICD-10-CM | POA: Diagnosis not present

## 2023-06-26 DIAGNOSIS — Z7982 Long term (current) use of aspirin: Secondary | ICD-10-CM | POA: Diagnosis not present

## 2023-06-26 DIAGNOSIS — I252 Old myocardial infarction: Secondary | ICD-10-CM | POA: Diagnosis not present

## 2023-06-26 DIAGNOSIS — M19011 Primary osteoarthritis, right shoulder: Secondary | ICD-10-CM | POA: Diagnosis not present

## 2023-06-26 DIAGNOSIS — B351 Tinea unguium: Secondary | ICD-10-CM | POA: Diagnosis not present

## 2023-06-26 DIAGNOSIS — M19012 Primary osteoarthritis, left shoulder: Secondary | ICD-10-CM | POA: Diagnosis not present

## 2023-06-26 DIAGNOSIS — E782 Mixed hyperlipidemia: Secondary | ICD-10-CM | POA: Diagnosis not present

## 2023-06-26 DIAGNOSIS — E1151 Type 2 diabetes mellitus with diabetic peripheral angiopathy without gangrene: Secondary | ICD-10-CM | POA: Diagnosis not present

## 2023-06-26 DIAGNOSIS — F419 Anxiety disorder, unspecified: Secondary | ICD-10-CM | POA: Diagnosis not present

## 2023-06-26 DIAGNOSIS — I2510 Atherosclerotic heart disease of native coronary artery without angina pectoris: Secondary | ICD-10-CM | POA: Diagnosis not present

## 2023-06-26 DIAGNOSIS — R300 Dysuria: Secondary | ICD-10-CM | POA: Diagnosis not present

## 2023-07-01 DIAGNOSIS — G4730 Sleep apnea, unspecified: Secondary | ICD-10-CM | POA: Diagnosis not present

## 2023-07-01 DIAGNOSIS — M19012 Primary osteoarthritis, left shoulder: Secondary | ICD-10-CM | POA: Diagnosis not present

## 2023-07-01 DIAGNOSIS — B351 Tinea unguium: Secondary | ICD-10-CM | POA: Diagnosis not present

## 2023-07-01 DIAGNOSIS — R300 Dysuria: Secondary | ICD-10-CM | POA: Diagnosis not present

## 2023-07-01 DIAGNOSIS — I2510 Atherosclerotic heart disease of native coronary artery without angina pectoris: Secondary | ICD-10-CM | POA: Diagnosis not present

## 2023-07-01 DIAGNOSIS — Z96652 Presence of left artificial knee joint: Secondary | ICD-10-CM | POA: Diagnosis not present

## 2023-07-01 DIAGNOSIS — E782 Mixed hyperlipidemia: Secondary | ICD-10-CM | POA: Diagnosis not present

## 2023-07-01 DIAGNOSIS — F419 Anxiety disorder, unspecified: Secondary | ICD-10-CM | POA: Diagnosis not present

## 2023-07-01 DIAGNOSIS — M19011 Primary osteoarthritis, right shoulder: Secondary | ICD-10-CM | POA: Diagnosis not present

## 2023-07-01 DIAGNOSIS — Z7984 Long term (current) use of oral hypoglycemic drugs: Secondary | ICD-10-CM | POA: Diagnosis not present

## 2023-07-01 DIAGNOSIS — Z7982 Long term (current) use of aspirin: Secondary | ICD-10-CM | POA: Diagnosis not present

## 2023-07-01 DIAGNOSIS — Z471 Aftercare following joint replacement surgery: Secondary | ICD-10-CM | POA: Diagnosis not present

## 2023-07-01 DIAGNOSIS — I252 Old myocardial infarction: Secondary | ICD-10-CM | POA: Diagnosis not present

## 2023-07-01 DIAGNOSIS — E1151 Type 2 diabetes mellitus with diabetic peripheral angiopathy without gangrene: Secondary | ICD-10-CM | POA: Diagnosis not present

## 2023-07-01 DIAGNOSIS — I10 Essential (primary) hypertension: Secondary | ICD-10-CM | POA: Diagnosis not present

## 2023-07-01 DIAGNOSIS — M1711 Unilateral primary osteoarthritis, right knee: Secondary | ICD-10-CM | POA: Diagnosis not present

## 2023-07-02 DIAGNOSIS — F419 Anxiety disorder, unspecified: Secondary | ICD-10-CM | POA: Diagnosis not present

## 2023-07-02 DIAGNOSIS — Z7982 Long term (current) use of aspirin: Secondary | ICD-10-CM | POA: Diagnosis not present

## 2023-07-02 DIAGNOSIS — B351 Tinea unguium: Secondary | ICD-10-CM | POA: Diagnosis not present

## 2023-07-02 DIAGNOSIS — E782 Mixed hyperlipidemia: Secondary | ICD-10-CM | POA: Diagnosis not present

## 2023-07-02 DIAGNOSIS — M19011 Primary osteoarthritis, right shoulder: Secondary | ICD-10-CM | POA: Diagnosis not present

## 2023-07-02 DIAGNOSIS — Z7984 Long term (current) use of oral hypoglycemic drugs: Secondary | ICD-10-CM | POA: Diagnosis not present

## 2023-07-02 DIAGNOSIS — Z471 Aftercare following joint replacement surgery: Secondary | ICD-10-CM | POA: Diagnosis not present

## 2023-07-02 DIAGNOSIS — I10 Essential (primary) hypertension: Secondary | ICD-10-CM | POA: Diagnosis not present

## 2023-07-02 DIAGNOSIS — Z96652 Presence of left artificial knee joint: Secondary | ICD-10-CM | POA: Diagnosis not present

## 2023-07-02 DIAGNOSIS — R300 Dysuria: Secondary | ICD-10-CM | POA: Diagnosis not present

## 2023-07-02 DIAGNOSIS — I252 Old myocardial infarction: Secondary | ICD-10-CM | POA: Diagnosis not present

## 2023-07-02 DIAGNOSIS — I2510 Atherosclerotic heart disease of native coronary artery without angina pectoris: Secondary | ICD-10-CM | POA: Diagnosis not present

## 2023-07-02 DIAGNOSIS — M1711 Unilateral primary osteoarthritis, right knee: Secondary | ICD-10-CM | POA: Diagnosis not present

## 2023-07-02 DIAGNOSIS — M19012 Primary osteoarthritis, left shoulder: Secondary | ICD-10-CM | POA: Diagnosis not present

## 2023-07-02 DIAGNOSIS — G4730 Sleep apnea, unspecified: Secondary | ICD-10-CM | POA: Diagnosis not present

## 2023-07-02 DIAGNOSIS — E1151 Type 2 diabetes mellitus with diabetic peripheral angiopathy without gangrene: Secondary | ICD-10-CM | POA: Diagnosis not present

## 2023-07-03 DIAGNOSIS — M1711 Unilateral primary osteoarthritis, right knee: Secondary | ICD-10-CM | POA: Diagnosis not present

## 2023-07-03 DIAGNOSIS — M19011 Primary osteoarthritis, right shoulder: Secondary | ICD-10-CM | POA: Diagnosis not present

## 2023-07-03 DIAGNOSIS — M19012 Primary osteoarthritis, left shoulder: Secondary | ICD-10-CM | POA: Diagnosis not present

## 2023-07-03 DIAGNOSIS — Z471 Aftercare following joint replacement surgery: Secondary | ICD-10-CM | POA: Diagnosis not present

## 2023-07-03 DIAGNOSIS — E1151 Type 2 diabetes mellitus with diabetic peripheral angiopathy without gangrene: Secondary | ICD-10-CM | POA: Diagnosis not present

## 2023-07-03 DIAGNOSIS — I252 Old myocardial infarction: Secondary | ICD-10-CM | POA: Diagnosis not present

## 2023-07-03 DIAGNOSIS — E782 Mixed hyperlipidemia: Secondary | ICD-10-CM | POA: Diagnosis not present

## 2023-07-03 DIAGNOSIS — G4730 Sleep apnea, unspecified: Secondary | ICD-10-CM | POA: Diagnosis not present

## 2023-07-03 DIAGNOSIS — Z7984 Long term (current) use of oral hypoglycemic drugs: Secondary | ICD-10-CM | POA: Diagnosis not present

## 2023-07-03 DIAGNOSIS — F419 Anxiety disorder, unspecified: Secondary | ICD-10-CM | POA: Diagnosis not present

## 2023-07-03 DIAGNOSIS — B351 Tinea unguium: Secondary | ICD-10-CM | POA: Diagnosis not present

## 2023-07-03 DIAGNOSIS — I10 Essential (primary) hypertension: Secondary | ICD-10-CM | POA: Diagnosis not present

## 2023-07-03 DIAGNOSIS — Z96652 Presence of left artificial knee joint: Secondary | ICD-10-CM | POA: Diagnosis not present

## 2023-07-03 DIAGNOSIS — I2510 Atherosclerotic heart disease of native coronary artery without angina pectoris: Secondary | ICD-10-CM | POA: Diagnosis not present

## 2023-07-03 DIAGNOSIS — R300 Dysuria: Secondary | ICD-10-CM | POA: Diagnosis not present

## 2023-07-03 DIAGNOSIS — Z7982 Long term (current) use of aspirin: Secondary | ICD-10-CM | POA: Diagnosis not present

## 2023-07-03 IMAGING — MG MM DIGITAL SCREENING BILAT W/ TOMO AND CAD
6 of 10 series · 6 of 30 positions shown · non-contrast
Comparison: Previous exam(s).

CLINICAL DATA: Screening.

EXAM:
DIGITAL SCREENING BILATERAL MAMMOGRAM WITH TOMOSYNTHESIS AND CAD
TECHNIQUE: Bilateral screening digital craniocaudal and mediolateral oblique
mammograms were obtained. Bilateral screening digital breast
tomosynthesis was performed. The images were evaluated with
computer-aided detection.

[R MLO synth-2D (1 of 2)]
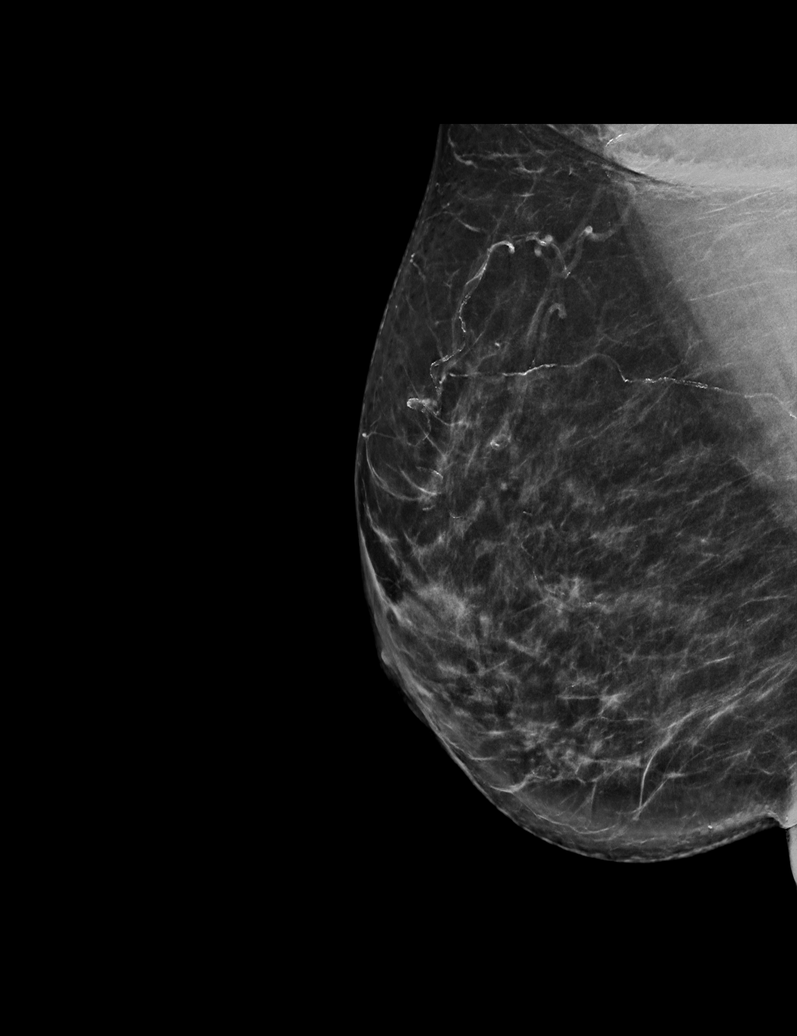

[L MLO synth-2D]
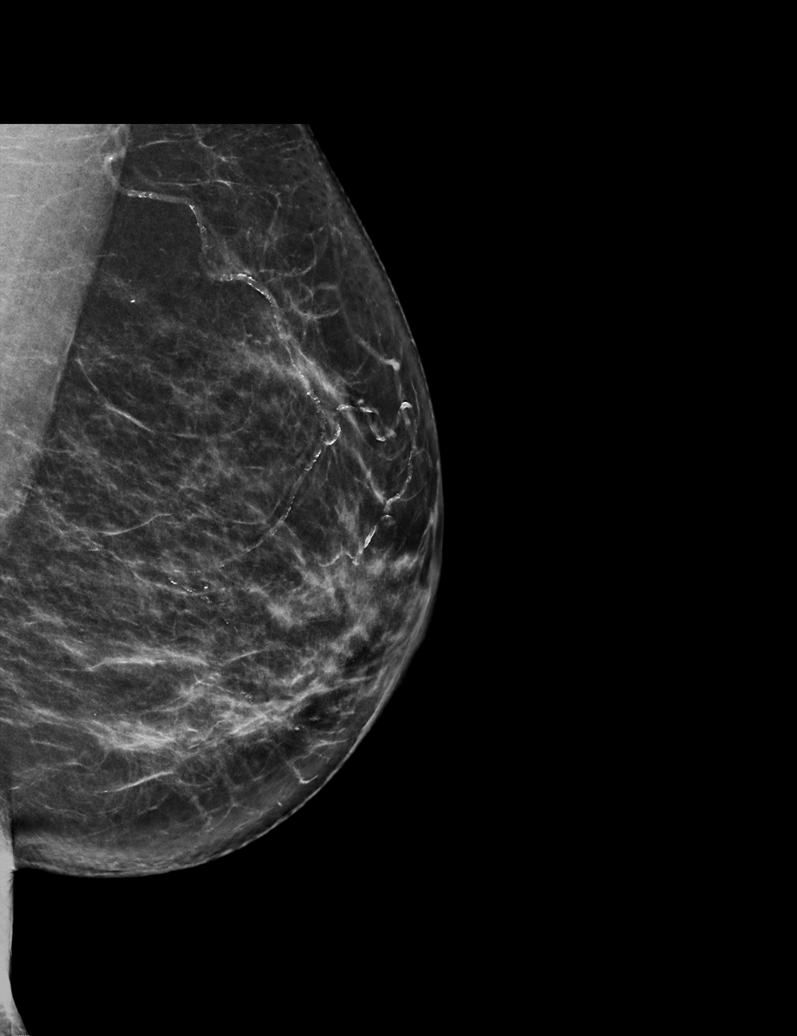

[R MLO synth-2D (2 of 2)]
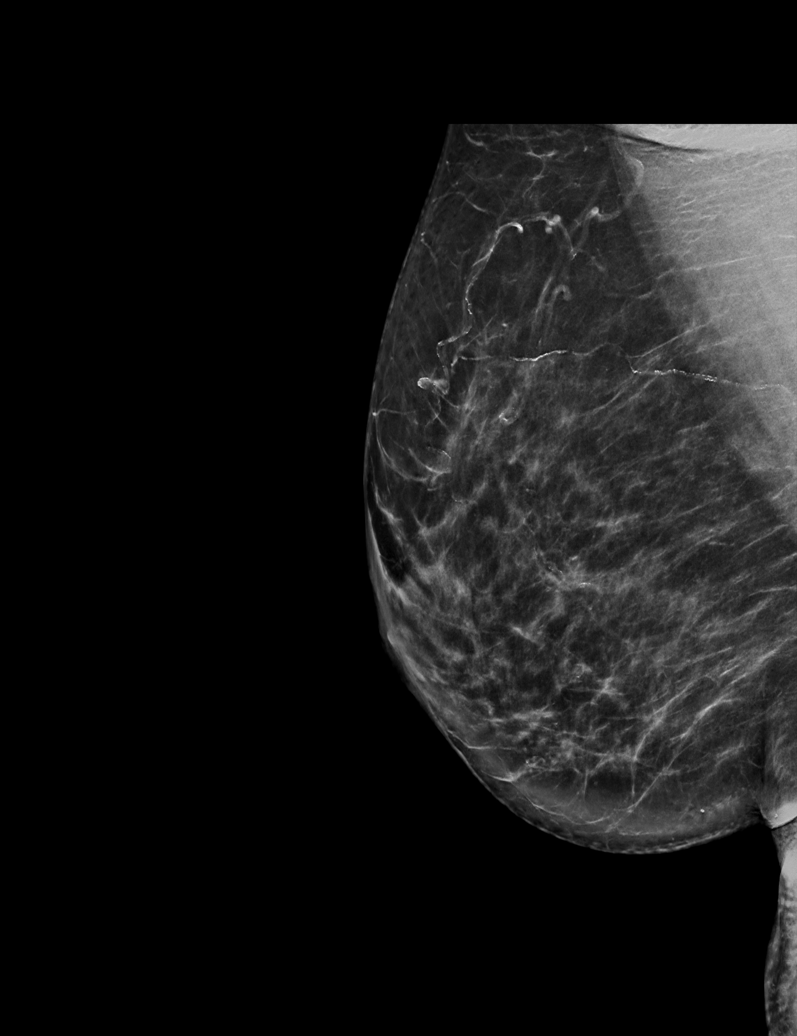

[L CC synth-2D]
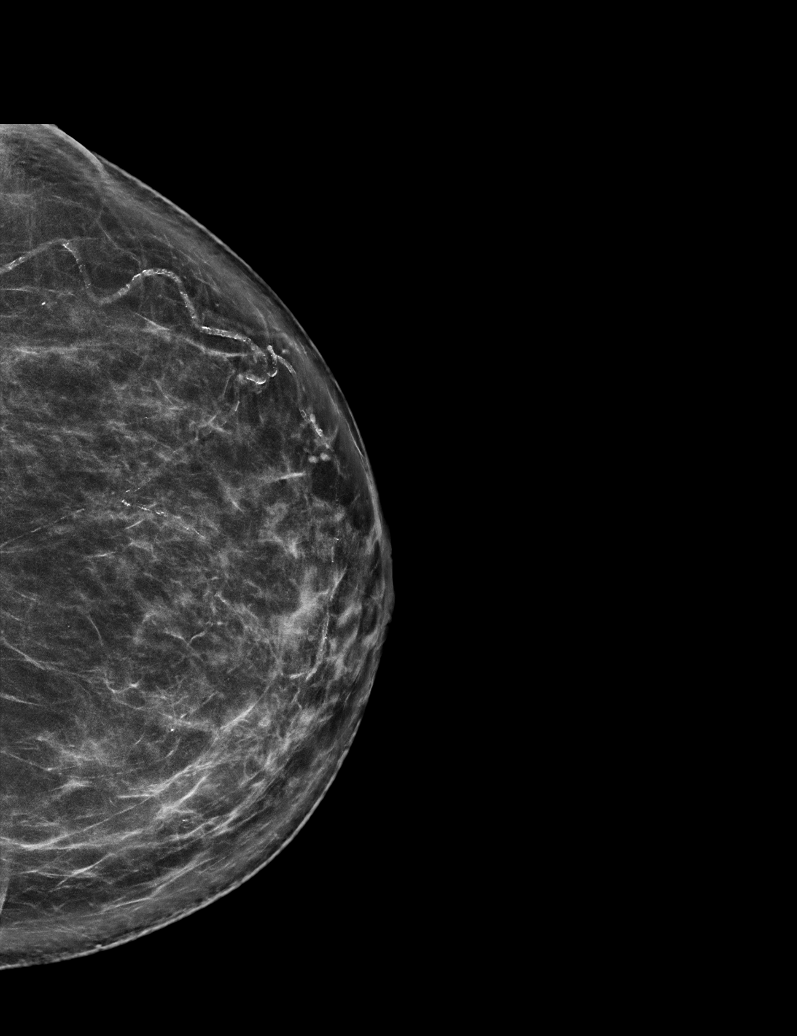

[R CC synth-2D]
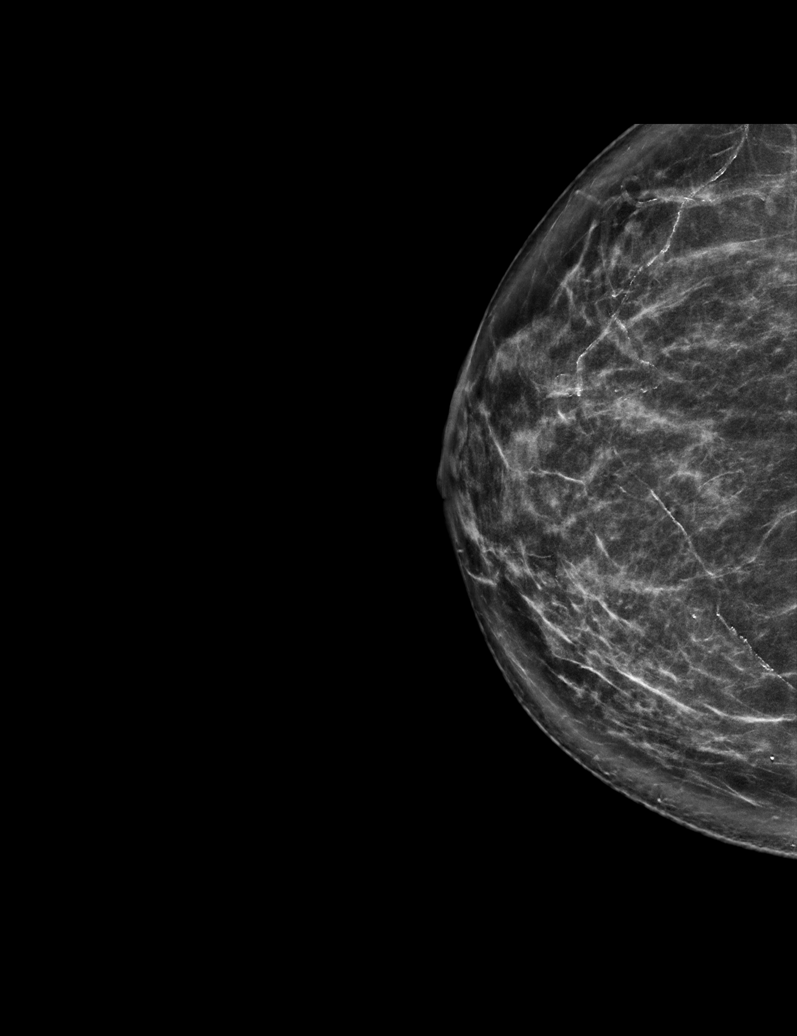

[L MLO tomo · tomo slice 37/72.0]
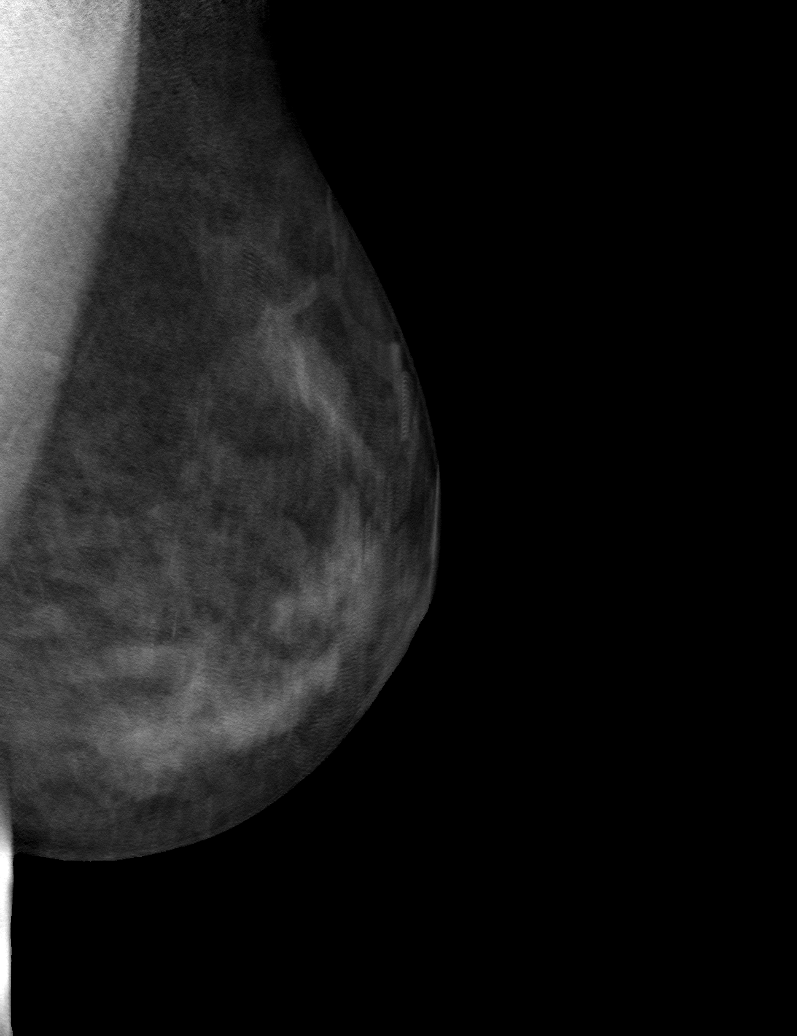

[6 of 30 positions shown; findings below may reference images not displayed]

ACR Breast Density Category c: The breast tissue is heterogeneously
dense, which may obscure small masses.
FINDINGS: There are no findings suspicious for malignancy.
IMPRESSION: No mammographic evidence of malignancy. A result letter of this
screening mammogram will be mailed directly to the patient.

RECOMMENDATION:
Screening mammogram in one year. (Code:Q3-W-BC3)

BI-RADS CATEGORY  1: Negative.

## 2023-07-06 DIAGNOSIS — Z7982 Long term (current) use of aspirin: Secondary | ICD-10-CM | POA: Diagnosis not present

## 2023-07-06 DIAGNOSIS — G4730 Sleep apnea, unspecified: Secondary | ICD-10-CM | POA: Diagnosis not present

## 2023-07-06 DIAGNOSIS — B351 Tinea unguium: Secondary | ICD-10-CM | POA: Diagnosis not present

## 2023-07-06 DIAGNOSIS — I10 Essential (primary) hypertension: Secondary | ICD-10-CM | POA: Diagnosis not present

## 2023-07-06 DIAGNOSIS — M1711 Unilateral primary osteoarthritis, right knee: Secondary | ICD-10-CM | POA: Diagnosis not present

## 2023-07-06 DIAGNOSIS — M19012 Primary osteoarthritis, left shoulder: Secondary | ICD-10-CM | POA: Diagnosis not present

## 2023-07-06 DIAGNOSIS — Z96652 Presence of left artificial knee joint: Secondary | ICD-10-CM | POA: Diagnosis not present

## 2023-07-06 DIAGNOSIS — M19011 Primary osteoarthritis, right shoulder: Secondary | ICD-10-CM | POA: Diagnosis not present

## 2023-07-06 DIAGNOSIS — R300 Dysuria: Secondary | ICD-10-CM | POA: Diagnosis not present

## 2023-07-06 DIAGNOSIS — I2510 Atherosclerotic heart disease of native coronary artery without angina pectoris: Secondary | ICD-10-CM | POA: Diagnosis not present

## 2023-07-06 DIAGNOSIS — E782 Mixed hyperlipidemia: Secondary | ICD-10-CM | POA: Diagnosis not present

## 2023-07-06 DIAGNOSIS — I252 Old myocardial infarction: Secondary | ICD-10-CM | POA: Diagnosis not present

## 2023-07-06 DIAGNOSIS — Z7984 Long term (current) use of oral hypoglycemic drugs: Secondary | ICD-10-CM | POA: Diagnosis not present

## 2023-07-06 DIAGNOSIS — F419 Anxiety disorder, unspecified: Secondary | ICD-10-CM | POA: Diagnosis not present

## 2023-07-06 DIAGNOSIS — Z471 Aftercare following joint replacement surgery: Secondary | ICD-10-CM | POA: Diagnosis not present

## 2023-07-06 DIAGNOSIS — E1151 Type 2 diabetes mellitus with diabetic peripheral angiopathy without gangrene: Secondary | ICD-10-CM | POA: Diagnosis not present

## 2023-07-07 DIAGNOSIS — G4730 Sleep apnea, unspecified: Secondary | ICD-10-CM | POA: Diagnosis not present

## 2023-07-07 DIAGNOSIS — Z96652 Presence of left artificial knee joint: Secondary | ICD-10-CM | POA: Diagnosis not present

## 2023-07-07 DIAGNOSIS — M19012 Primary osteoarthritis, left shoulder: Secondary | ICD-10-CM | POA: Diagnosis not present

## 2023-07-07 DIAGNOSIS — E1151 Type 2 diabetes mellitus with diabetic peripheral angiopathy without gangrene: Secondary | ICD-10-CM | POA: Diagnosis not present

## 2023-07-07 DIAGNOSIS — B351 Tinea unguium: Secondary | ICD-10-CM | POA: Diagnosis not present

## 2023-07-07 DIAGNOSIS — E782 Mixed hyperlipidemia: Secondary | ICD-10-CM | POA: Diagnosis not present

## 2023-07-07 DIAGNOSIS — R300 Dysuria: Secondary | ICD-10-CM | POA: Diagnosis not present

## 2023-07-07 DIAGNOSIS — I10 Essential (primary) hypertension: Secondary | ICD-10-CM | POA: Diagnosis not present

## 2023-07-07 DIAGNOSIS — Z7984 Long term (current) use of oral hypoglycemic drugs: Secondary | ICD-10-CM | POA: Diagnosis not present

## 2023-07-07 DIAGNOSIS — I2510 Atherosclerotic heart disease of native coronary artery without angina pectoris: Secondary | ICD-10-CM | POA: Diagnosis not present

## 2023-07-07 DIAGNOSIS — F419 Anxiety disorder, unspecified: Secondary | ICD-10-CM | POA: Diagnosis not present

## 2023-07-07 DIAGNOSIS — M1711 Unilateral primary osteoarthritis, right knee: Secondary | ICD-10-CM | POA: Diagnosis not present

## 2023-07-07 DIAGNOSIS — Z7982 Long term (current) use of aspirin: Secondary | ICD-10-CM | POA: Diagnosis not present

## 2023-07-07 DIAGNOSIS — Z471 Aftercare following joint replacement surgery: Secondary | ICD-10-CM | POA: Diagnosis not present

## 2023-07-07 DIAGNOSIS — I252 Old myocardial infarction: Secondary | ICD-10-CM | POA: Diagnosis not present

## 2023-07-07 DIAGNOSIS — M19011 Primary osteoarthritis, right shoulder: Secondary | ICD-10-CM | POA: Diagnosis not present

## 2023-07-08 DIAGNOSIS — G4730 Sleep apnea, unspecified: Secondary | ICD-10-CM | POA: Diagnosis not present

## 2023-07-08 DIAGNOSIS — M19012 Primary osteoarthritis, left shoulder: Secondary | ICD-10-CM | POA: Diagnosis not present

## 2023-07-08 DIAGNOSIS — Z7982 Long term (current) use of aspirin: Secondary | ICD-10-CM | POA: Diagnosis not present

## 2023-07-08 DIAGNOSIS — Z471 Aftercare following joint replacement surgery: Secondary | ICD-10-CM | POA: Diagnosis not present

## 2023-07-08 DIAGNOSIS — F419 Anxiety disorder, unspecified: Secondary | ICD-10-CM | POA: Diagnosis not present

## 2023-07-08 DIAGNOSIS — Z7984 Long term (current) use of oral hypoglycemic drugs: Secondary | ICD-10-CM | POA: Diagnosis not present

## 2023-07-08 DIAGNOSIS — B351 Tinea unguium: Secondary | ICD-10-CM | POA: Diagnosis not present

## 2023-07-08 DIAGNOSIS — M19011 Primary osteoarthritis, right shoulder: Secondary | ICD-10-CM | POA: Diagnosis not present

## 2023-07-08 DIAGNOSIS — E782 Mixed hyperlipidemia: Secondary | ICD-10-CM | POA: Diagnosis not present

## 2023-07-08 DIAGNOSIS — Z96652 Presence of left artificial knee joint: Secondary | ICD-10-CM | POA: Diagnosis not present

## 2023-07-08 DIAGNOSIS — I10 Essential (primary) hypertension: Secondary | ICD-10-CM | POA: Diagnosis not present

## 2023-07-08 DIAGNOSIS — M1711 Unilateral primary osteoarthritis, right knee: Secondary | ICD-10-CM | POA: Diagnosis not present

## 2023-07-08 DIAGNOSIS — R300 Dysuria: Secondary | ICD-10-CM | POA: Diagnosis not present

## 2023-07-08 DIAGNOSIS — E1151 Type 2 diabetes mellitus with diabetic peripheral angiopathy without gangrene: Secondary | ICD-10-CM | POA: Diagnosis not present

## 2023-07-08 DIAGNOSIS — I2510 Atherosclerotic heart disease of native coronary artery without angina pectoris: Secondary | ICD-10-CM | POA: Diagnosis not present

## 2023-07-08 DIAGNOSIS — I252 Old myocardial infarction: Secondary | ICD-10-CM | POA: Diagnosis not present

## 2023-07-12 DIAGNOSIS — I2510 Atherosclerotic heart disease of native coronary artery without angina pectoris: Secondary | ICD-10-CM | POA: Diagnosis not present

## 2023-07-12 DIAGNOSIS — Z96652 Presence of left artificial knee joint: Secondary | ICD-10-CM | POA: Diagnosis not present

## 2023-07-12 DIAGNOSIS — G4730 Sleep apnea, unspecified: Secondary | ICD-10-CM | POA: Diagnosis not present

## 2023-07-12 DIAGNOSIS — F419 Anxiety disorder, unspecified: Secondary | ICD-10-CM | POA: Diagnosis not present

## 2023-07-12 DIAGNOSIS — Z7984 Long term (current) use of oral hypoglycemic drugs: Secondary | ICD-10-CM | POA: Diagnosis not present

## 2023-07-12 DIAGNOSIS — M19011 Primary osteoarthritis, right shoulder: Secondary | ICD-10-CM | POA: Diagnosis not present

## 2023-07-12 DIAGNOSIS — B351 Tinea unguium: Secondary | ICD-10-CM | POA: Diagnosis not present

## 2023-07-12 DIAGNOSIS — Z471 Aftercare following joint replacement surgery: Secondary | ICD-10-CM | POA: Diagnosis not present

## 2023-07-12 DIAGNOSIS — I10 Essential (primary) hypertension: Secondary | ICD-10-CM | POA: Diagnosis not present

## 2023-07-12 DIAGNOSIS — E782 Mixed hyperlipidemia: Secondary | ICD-10-CM | POA: Diagnosis not present

## 2023-07-12 DIAGNOSIS — I252 Old myocardial infarction: Secondary | ICD-10-CM | POA: Diagnosis not present

## 2023-07-12 DIAGNOSIS — Z7982 Long term (current) use of aspirin: Secondary | ICD-10-CM | POA: Diagnosis not present

## 2023-07-12 DIAGNOSIS — M1711 Unilateral primary osteoarthritis, right knee: Secondary | ICD-10-CM | POA: Diagnosis not present

## 2023-07-12 DIAGNOSIS — M19012 Primary osteoarthritis, left shoulder: Secondary | ICD-10-CM | POA: Diagnosis not present

## 2023-07-12 DIAGNOSIS — R300 Dysuria: Secondary | ICD-10-CM | POA: Diagnosis not present

## 2023-07-12 DIAGNOSIS — E1151 Type 2 diabetes mellitus with diabetic peripheral angiopathy without gangrene: Secondary | ICD-10-CM | POA: Diagnosis not present

## 2023-07-13 ENCOUNTER — Other Ambulatory Visit: Payer: Self-pay | Admitting: Family Medicine

## 2023-07-13 DIAGNOSIS — M1711 Unilateral primary osteoarthritis, right knee: Secondary | ICD-10-CM | POA: Diagnosis not present

## 2023-07-13 DIAGNOSIS — M19012 Primary osteoarthritis, left shoulder: Secondary | ICD-10-CM | POA: Diagnosis not present

## 2023-07-13 DIAGNOSIS — E1151 Type 2 diabetes mellitus with diabetic peripheral angiopathy without gangrene: Secondary | ICD-10-CM | POA: Diagnosis not present

## 2023-07-13 DIAGNOSIS — Z7984 Long term (current) use of oral hypoglycemic drugs: Secondary | ICD-10-CM | POA: Diagnosis not present

## 2023-07-13 DIAGNOSIS — M1712 Unilateral primary osteoarthritis, left knee: Secondary | ICD-10-CM

## 2023-07-13 DIAGNOSIS — Z96652 Presence of left artificial knee joint: Secondary | ICD-10-CM | POA: Diagnosis not present

## 2023-07-13 DIAGNOSIS — I10 Essential (primary) hypertension: Secondary | ICD-10-CM | POA: Diagnosis not present

## 2023-07-13 DIAGNOSIS — Z7982 Long term (current) use of aspirin: Secondary | ICD-10-CM | POA: Diagnosis not present

## 2023-07-13 DIAGNOSIS — M19011 Primary osteoarthritis, right shoulder: Secondary | ICD-10-CM | POA: Diagnosis not present

## 2023-07-13 DIAGNOSIS — F419 Anxiety disorder, unspecified: Secondary | ICD-10-CM | POA: Diagnosis not present

## 2023-07-13 DIAGNOSIS — B351 Tinea unguium: Secondary | ICD-10-CM | POA: Diagnosis not present

## 2023-07-13 DIAGNOSIS — I252 Old myocardial infarction: Secondary | ICD-10-CM | POA: Diagnosis not present

## 2023-07-13 DIAGNOSIS — I2510 Atherosclerotic heart disease of native coronary artery without angina pectoris: Secondary | ICD-10-CM | POA: Diagnosis not present

## 2023-07-13 DIAGNOSIS — R300 Dysuria: Secondary | ICD-10-CM | POA: Diagnosis not present

## 2023-07-13 DIAGNOSIS — Z471 Aftercare following joint replacement surgery: Secondary | ICD-10-CM | POA: Diagnosis not present

## 2023-07-13 DIAGNOSIS — E782 Mixed hyperlipidemia: Secondary | ICD-10-CM | POA: Diagnosis not present

## 2023-07-13 DIAGNOSIS — G4730 Sleep apnea, unspecified: Secondary | ICD-10-CM | POA: Diagnosis not present

## 2023-07-14 NOTE — Telephone Encounter (Signed)
Unable to refill per protocol, Rx expired. Discontinued 06/09/23.  Requested Prescriptions  Pending Prescriptions Disp Refills   acetaminophen-codeine (TYLENOL #3) 300-30 MG tablet [Pharmacy Med Name: ACETAMINOPHEN/COD #3 (300/30MG ) TAB] 60 tablet      Not Delegated - Analgesics:  Opioid Agonist Combinations 2 Failed - 07/13/2023 12:25 PM      Failed - This refill cannot be delegated      Failed - Cr in normal range and within 360 days    Creat  Date Value Ref Range Status  11/01/2015 1.20 (H) 0.50 - 0.99 mg/dL Final   Creatinine, Ser  Date Value Ref Range Status  06/10/2023 1.02 (H) 0.44 - 1.00 mg/dL Final   Creatinine, Urine  Date Value Ref Range Status  11/01/2015 82 20 - 320 mg/dL Final         Failed - Urine Drug Screen completed in last 360 days      Passed - eGFR is 10 or above and within 360 days    GFR, Est African American  Date Value Ref Range Status  01/11/2014 66 mL/min Final   GFR calc Af Amer  Date Value Ref Range Status  02/21/2020 55 (L) >59 mL/min/1.73 Final    Comment:    **[facility] currently reports eGFR in compliance with the current**   recommendations of the [employer]. [facility] will   update reporting as new guidelines are published from the NKF-ASN   Task force.    GFR, Est Non African American  Date Value Ref Range Status  01/11/2014 57 (L) mL/min Final    Comment:      The estimated GFR is a calculation valid for adults (>=83 years old) that uses the CKD-EPI algorithm to adjust for age and sex. It is   not to be used for children, pregnant women, hospitalized patients,    patients on dialysis, or with rapidly changing kidney function. According to the NKDEP, eGFR >89 is normal, 60-89 shows mild impairment, 30-59 shows moderate impairment, 15-29 shows severe impairment and <15 is ESRD.     GFR, Estimated  Date Value Ref Range Status  06/10/2023 58 (L) >60 mL/min Final    Comment:    (NOTE) Calculated using the CKD-EPI  Creatinine Equation (2021)    eGFR  Date Value Ref Range Status  05/27/2023 60 >59 mL/min/1.73 Final         Passed - Patient is not pregnant      Passed - Valid encounter within last 3 months    Recent Outpatient Visits           1 month ago Type 2 diabetes mellitus with diabetic polyneuropathy, without long-term current use of insulin (HCC)   [place] [facility] [place], Odette Horns, MD   2 months ago Hypertensive urgency   [place] [facility] Graham, [place], MD   4 months ago Type 2 diabetes mellitus with diabetic polyneuropathy, without long-term current use of insulin (HCC)   [place] [facility] [place], [place], MD   7 months ago Type 2 diabetes mellitus with diabetic polyneuropathy, without long-term current use of insulin (HCC)   [place] [place], [place], MD   11 months ago Type 2 diabetes mellitus with diabetic polyneuropathy, without long-term current use of insulin (HCC)   [place] [facility] Hoy Register, MD       Future Appointments  In 1 week Kathryne Hitch, MD [facility]

## 2023-07-15 DIAGNOSIS — M19012 Primary osteoarthritis, left shoulder: Secondary | ICD-10-CM | POA: Diagnosis not present

## 2023-07-15 DIAGNOSIS — M19011 Primary osteoarthritis, right shoulder: Secondary | ICD-10-CM | POA: Diagnosis not present

## 2023-07-15 DIAGNOSIS — M1711 Unilateral primary osteoarthritis, right knee: Secondary | ICD-10-CM | POA: Diagnosis not present

## 2023-07-15 DIAGNOSIS — G4730 Sleep apnea, unspecified: Secondary | ICD-10-CM | POA: Diagnosis not present

## 2023-07-15 DIAGNOSIS — B351 Tinea unguium: Secondary | ICD-10-CM | POA: Diagnosis not present

## 2023-07-15 DIAGNOSIS — E782 Mixed hyperlipidemia: Secondary | ICD-10-CM | POA: Diagnosis not present

## 2023-07-15 DIAGNOSIS — Z471 Aftercare following joint replacement surgery: Secondary | ICD-10-CM | POA: Diagnosis not present

## 2023-07-15 DIAGNOSIS — Z96652 Presence of left artificial knee joint: Secondary | ICD-10-CM | POA: Diagnosis not present

## 2023-07-15 DIAGNOSIS — F419 Anxiety disorder, unspecified: Secondary | ICD-10-CM | POA: Diagnosis not present

## 2023-07-15 DIAGNOSIS — E1151 Type 2 diabetes mellitus with diabetic peripheral angiopathy without gangrene: Secondary | ICD-10-CM | POA: Diagnosis not present

## 2023-07-15 DIAGNOSIS — I252 Old myocardial infarction: Secondary | ICD-10-CM | POA: Diagnosis not present

## 2023-07-15 DIAGNOSIS — I2510 Atherosclerotic heart disease of native coronary artery without angina pectoris: Secondary | ICD-10-CM | POA: Diagnosis not present

## 2023-07-15 DIAGNOSIS — Z7984 Long term (current) use of oral hypoglycemic drugs: Secondary | ICD-10-CM | POA: Diagnosis not present

## 2023-07-15 DIAGNOSIS — I10 Essential (primary) hypertension: Secondary | ICD-10-CM | POA: Diagnosis not present

## 2023-07-15 DIAGNOSIS — Z7982 Long term (current) use of aspirin: Secondary | ICD-10-CM | POA: Diagnosis not present

## 2023-07-15 DIAGNOSIS — R300 Dysuria: Secondary | ICD-10-CM | POA: Diagnosis not present

## 2023-07-17 ENCOUNTER — Telehealth: Payer: Self-pay

## 2023-07-17 MED ORDER — ACETAMINOPHEN-CODEINE 300-30 MG PO TABS
1.0000 | ORAL_TABLET | Freq: Two times a day (BID) | ORAL | 0 refills | Status: DC | PRN
Start: 2023-07-17 — End: 2023-07-22

## 2023-07-17 NOTE — Telephone Encounter (Signed)
Wants tylenol#3 for pain she states that she can not take Hydrocodone. Sent to [facility] [address]

## 2023-07-17 NOTE — Telephone Encounter (Signed)
Done

## 2023-07-22 ENCOUNTER — Ambulatory Visit (INDEPENDENT_AMBULATORY_CARE_PROVIDER_SITE_OTHER): Payer: BC Managed Care – PPO | Admitting: Orthopaedic Surgery

## 2023-07-22 ENCOUNTER — Encounter: Payer: Self-pay | Admitting: Orthopaedic Surgery

## 2023-07-22 DIAGNOSIS — Z96652 Presence of left artificial knee joint: Secondary | ICD-10-CM

## 2023-07-22 MED ORDER — ACETAMINOPHEN-CODEINE 300-30 MG PO TABS: 1.0000 | ORAL_TABLET | Freq: Two times a day (BID) | ORAL | 0 refills | Status: DC | PRN

## 2023-07-22 NOTE — Progress Notes (Signed)
HPI: Tammy Graham returns today status post left total knee arthroplasty 06/09/2023.  She states that the oxycodone is "too much".  She notes that it makes her sleep.  She also notes that her appetite is decreased.  She is going to [facility] for outpatient physical therapy.  She is ambulating with a walker.  Physical exam: General: Well-developed well-nourished female no acute distress mood and affect appropriate.  Psych alert and oriented x 3. Left knee surgical incisions healing well no signs of infection.  Lacks full extension by few degrees.  Flexes initially to just 90 degrees after range of motion she is flexing to approximately 100 degrees.  Impression: Status post left total knee arthroplasty 6 weeks  Plan: She will continue work on range of motion and strengthening.  She is shown exercises to work on getting her full flexion.  Will see her back in 2 weeks to see how she is doing.  If she is not a considerable improvement consider closed manipulation under anesthesia.  Questions were encouraged and answered by Dr. Magnus Ivan and myself.  Changed her oxycodone to Tylenol #3.

## 2023-08-06 ENCOUNTER — Institutional Professional Consult (permissible substitution) (HOSPITAL_BASED_OUTPATIENT_CLINIC_OR_DEPARTMENT_OTHER): Payer: BC Managed Care – PPO | Admitting: Family

## 2023-08-10 ENCOUNTER — Ambulatory Visit (INDEPENDENT_AMBULATORY_CARE_PROVIDER_SITE_OTHER): Payer: BC Managed Care – PPO | Admitting: Orthopaedic Surgery

## 2023-08-10 ENCOUNTER — Encounter: Payer: Self-pay | Admitting: Orthopaedic Surgery

## 2023-08-10 ENCOUNTER — Telehealth: Payer: Self-pay | Admitting: Orthopaedic Surgery

## 2023-08-10 DIAGNOSIS — T8482XD Fibrosis due to internal orthopedic prosthetic devices, implants and grafts, subsequent encounter: Secondary | ICD-10-CM

## 2023-08-10 DIAGNOSIS — Z96652 Presence of left artificial knee joint: Secondary | ICD-10-CM

## 2023-08-10 NOTE — Telephone Encounter (Signed)
Received [employer] form. Patient to come back with auth and $25 form fee for [employer].

## 2023-08-10 NOTE — Progress Notes (Signed)
The patient is now 9 weeks status post a left total knee arthroplasty.  She is 72 years old.  She has been going to outpatient physical therapy but is only had limited visits.  On exam today her left knee is still swollen to be expected.  She lacks full extension by maybe 3 to 5 degrees but I can only flex her to close to 90 degrees.  At this point I recommended a manipulation under anesthesia.  I described what this procedure involves as well as the risk and benefits of surgery and the procedure.  She would need to get back into physical therapy the day of the procedure released the next day.  She has PT tomorrow so I let her know to let them know I plan on manipulating her knee.  We would then see her back in 2 weeks postoperative with a standing AP and lateral of that knee.

## 2023-08-12 DIAGNOSIS — D2371 Other benign neoplasm of skin of right lower limb, including hip: Secondary | ICD-10-CM | POA: Diagnosis not present

## 2023-08-13 ENCOUNTER — Other Ambulatory Visit: Payer: Self-pay | Admitting: Orthopaedic Surgery

## 2023-08-13 DIAGNOSIS — G8918 Other acute postprocedural pain: Secondary | ICD-10-CM | POA: Diagnosis not present

## 2023-08-13 DIAGNOSIS — M24662 Ankylosis, left knee: Secondary | ICD-10-CM | POA: Diagnosis not present

## 2023-08-13 MED ORDER — HYDROCODONE-ACETAMINOPHEN 5-325 MG PO TABS
1.0000 | ORAL_TABLET | Freq: Four times a day (QID) | ORAL | 0 refills | Status: DC | PRN
Start: 2023-08-13 — End: 2023-08-27

## 2023-08-27 ENCOUNTER — Ambulatory Visit: Payer: BC Managed Care – PPO | Admitting: Physician Assistant

## 2023-08-27 ENCOUNTER — Encounter: Payer: Self-pay | Admitting: Physician Assistant

## 2023-08-27 DIAGNOSIS — T8482XD Fibrosis due to internal orthopedic prosthetic devices, implants and grafts, subsequent encounter: Secondary | ICD-10-CM

## 2023-08-27 DIAGNOSIS — Z96652 Presence of left artificial knee joint: Secondary | ICD-10-CM

## 2023-08-27 MED ORDER — ACETAMINOPHEN-CODEINE 300-30 MG PO TABS: 1.0000 | ORAL_TABLET | Freq: Four times a day (QID) | ORAL | 0 refills | Status: DC | PRN

## 2023-08-27 NOTE — Progress Notes (Signed)
HPI: Mrs. Pedigo returns today status post left knee manipulation 08/13/2023.  History of left total knee arthroplasty 06/09/2023.  She developed arthrofibrosis.  She states that she has shooting pains in the knee at times.  Ranks her pain to be 8 out of 10 pain at worst.  She has been taking Tylenol 3 and wants a refill.  She has been going to physical therapy and feels that overall her range of motion is improving.  She states that therapy got her to 115 degrees of flexion.  Review of systems: See HPI otherwise negative  Physical exam: General Well-developed well-nourished female who walks with slight antalgic gait no assistive device. Left knee full extension flexion to 100 degrees.  Surgical incisions well-healing.  Calf supple nontender.  Impression: Status post left knee manipulation under anesthesia 08/13/2023  Plan: She will continue work on range of motion strengthening the knee.  Follow-up with [name] in 6 weeks sooner if there is any questions or concerns.  Will reevaluate her work status at that visit.  She will remain out of work until follow-up in 6 weeks.  Refill on Tylenol 3 was given.

## 2023-09-02 ENCOUNTER — Ambulatory Visit (INDEPENDENT_AMBULATORY_CARE_PROVIDER_SITE_OTHER)
Admission: RE | Admit: 2023-09-02 | Discharge: 2023-09-02 | Disposition: A | Discharge status: Home / Self Care | Payer: BC Managed Care – PPO | Source: Ambulatory Visit | Attending: Vascular Surgery | Admitting: Vascular Surgery

## 2023-09-02 ENCOUNTER — Other Ambulatory Visit: Payer: Self-pay | Admitting: Family Medicine

## 2023-09-02 ENCOUNTER — Ambulatory Visit: Payer: BC Managed Care – PPO

## 2023-09-02 ENCOUNTER — Ambulatory Visit (HOSPITAL_COMMUNITY)
Admission: RE | Admit: 2023-09-02 | Discharge: 2023-09-02 | Disposition: A | Discharge status: Home / Self Care | Payer: BC Managed Care – PPO | Source: Ambulatory Visit | Attending: Vascular Surgery | Admitting: Vascular Surgery

## 2023-09-02 DIAGNOSIS — I70211 Atherosclerosis of native arteries of extremities with intermittent claudication, right leg: Secondary | ICD-10-CM | POA: Insufficient documentation

## 2023-09-02 DIAGNOSIS — I739 Peripheral vascular disease, unspecified: Secondary | ICD-10-CM | POA: Diagnosis not present

## 2023-09-02 DIAGNOSIS — E1169 Type 2 diabetes mellitus with other specified complication: Secondary | ICD-10-CM

## 2023-09-02 LAB — VAS US ABI WITH/WO TBI
Left ABI: 1.03
Right ABI: 1.23

## 2023-09-03 ENCOUNTER — Telehealth: Payer: Self-pay | Admitting: Orthopaedic Surgery

## 2023-09-03 NOTE — Telephone Encounter (Signed)
Received [place]ta [place] form & auth. To [employer].

## 2023-09-03 NOTE — Telephone Encounter (Signed)
Request is too soon, last refill for 90 and 1 refill.  Requested Prescriptions  Pending Prescriptions Disp Refills   atorvastatin (LIPITOR) 20 MG tablet [Pharmacy Med Name: ATORVASTATIN 20MG  TABLETS] 90 tablet 1    Sig: TAKE 1 TABLET(20 MG) BY MOUTH DAILY     Cardiovascular:  Antilipid - Statins Failed - 09/02/2023  3:42 AM      Failed - Lipid Panel in normal range within the last 12 months    Cholesterol, Total  Date Value Ref Range Status  05/27/2023 107 100 - 199 mg/dL Final   LDL Chol Calc (NIH)  Date Value Ref Range Status  05/27/2023 43 0 - 99 mg/dL Final   HDL  Date Value Ref Range Status  05/27/2023 50 >39 mg/dL Final   Triglycerides  Date Value Ref Range Status  05/27/2023 62 0 - 149 mg/dL Final         Passed - Patient is not pregnant      Passed - Valid encounter within last 12 months    Recent Outpatient Visits           3 months ago Type 2 diabetes mellitus with diabetic polyneuropathy, without long-term current use of insulin (HCC)   [place] [facility] [place] Hoy Register, MD   4 months ago Hypertensive urgency   [place] [facility] [place] [place] Hoy Register, MD   5 months ago Type 2 diabetes mellitus with diabetic polyneuropathy, without long-term current use of insulin (HCC)   [place] [facility] [place] [facility] [place] Hoy Register, MD   8 months ago Type 2 diabetes mellitus with diabetic polyneuropathy, without long-term current use of insulin (HCC)   [place] [facility] [place] [facility] [place] Hoy Register, MD   1 year ago Type 2 diabetes mellitus with diabetic polyneuropathy, without long-term current use of insulin (HCC)   [place] [facility] [place] [facility] [place]. [facility] Hoy Register, MD

## 2023-09-15 ENCOUNTER — Encounter (HOSPITAL_BASED_OUTPATIENT_CLINIC_OR_DEPARTMENT_OTHER): Payer: Self-pay

## 2023-09-16 ENCOUNTER — Other Ambulatory Visit: Payer: Self-pay | Admitting: Family Medicine

## 2023-09-16 DIAGNOSIS — I160 Hypertensive urgency: Secondary | ICD-10-CM

## 2023-09-16 NOTE — Telephone Encounter (Signed)
Rx was sent to pharmacy on 05/05/23 #90/1 RF  Requested Prescriptions  Pending Prescriptions Disp Refills   chlorthalidone (HYGROTON) 25 MG tablet [Pharmacy Med Name: CHLORTHALIDONE 25MG  TABLETS] 90 tablet 1    Sig: TAKE 1 TABLET(25 MG) BY MOUTH DAILY     Cardiovascular: Diuretics - Thiazide Failed - 09/16/2023  5:26 PM      Failed - Cr in normal range and within 180 days    Creat  Date Value Ref Range Status  11/01/2015 1.20 (H) 0.50 - 0.99 mg/dL Final   Creatinine, Ser  Date Value Ref Range Status  06/10/2023 1.02 (H) 0.44 - 1.00 mg/dL Final   Creatinine, Urine  Date Value Ref Range Status  11/01/2015 82 20 - 320 mg/dL Final         Passed - K in normal range and within 180 days    Potassium  Date Value Ref Range Status  06/10/2023 4.0 3.5 - 5.1 mmol/L Final         Passed - Na in normal range and within 180 days    Sodium  Date Value Ref Range Status  06/10/2023 137 135 - 145 mmol/L Final  05/27/2023 144 134 - 144 mmol/L Final         Passed - Last BP in normal range    BP Readings from Last 1 Encounters:  06/15/23 128/64         Passed - Valid encounter within last 6 months    Recent Outpatient Visits           3 months ago Type 2 diabetes mellitus with diabetic polyneuropathy, without long-term current use of insulin (HCC)   [place] [facility] [place] Hoy Register, MD   4 months ago Hypertensive urgency   [place] [facility] [place] [facility] [place] Hoy Register, MD   6 months ago Type 2 diabetes mellitus with diabetic polyneuropathy, without long-term current use of insulin (HCC)   [place] [facility] [place] [facility] [place]. [facility] Hoy Register, MD   9 months ago Type 2 diabetes mellitus with diabetic polyneuropathy, without long-term current use of insulin (HCC)   [place] [facility] [place] [facility] [place]. [facility] Hoy Register, MD   1 year ago Type 2 diabetes mellitus with diabetic polyneuropathy, without long-term current use of insulin (HCC)   [place] [facility] [place] [facility] [place]. [facility] Hoy Register, MD

## 2023-09-22 ENCOUNTER — Telehealth: Payer: Self-pay | Admitting: Orthopaedic Surgery

## 2023-09-22 ENCOUNTER — Other Ambulatory Visit: Payer: Self-pay | Admitting: Physician Assistant

## 2023-09-22 NOTE — Telephone Encounter (Signed)
Patient is having P.T. through 12/26. Please advise if okay to have oow until 12/26. Thank you!

## 2023-09-23 ENCOUNTER — Encounter: Payer: Self-pay | Admitting: Radiology

## 2023-09-23 NOTE — Telephone Encounter (Signed)
Spoke with patient. Aware note was sent through MyChart

## 2023-10-05 ENCOUNTER — Encounter: Payer: Self-pay | Admitting: Physician Assistant

## 2023-10-05 ENCOUNTER — Ambulatory Visit (INDEPENDENT_AMBULATORY_CARE_PROVIDER_SITE_OTHER): Payer: BC Managed Care – PPO | Admitting: Physician Assistant

## 2023-10-05 DIAGNOSIS — Z96652 Presence of left artificial knee joint: Secondary | ICD-10-CM

## 2023-10-05 DIAGNOSIS — T8482XD Fibrosis due to internal orthopedic prosthetic devices, implants and grafts, subsequent encounter: Secondary | ICD-10-CM

## 2023-10-05 NOTE — Progress Notes (Signed)
HPI: Mrs. Cabral returns today for follow-up of her left total knee.  She is status post left total knee 06/09/2023 with manipulation 08/09/2023.  She states that she feels like she needs more therapy.  She continues to have 8 out of 10 sharp shooting pain that occurs periodically.  She is taking Tylenol 3 for pain.  She also feels that her gait balance is still off.   Physical exam: General Well-developed well-nourished female no acute distress mood affect appropriate.  She is able to get up and down on her own from a seated position. Left knee: Full extension, flexion to 105 degrees.  Surgical incisions well-healed.  No gross instability valgus varus stressing.  No signs of infection.  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  Discussed quad strengthening exercises with her.  They will also work with her on gait and balance therapy.  She will follow-up with [name] in 1 month we will reevaluate her work status at that time.  Questions were encouraged and answered at length

## 2023-11-02 ENCOUNTER — Encounter: Payer: Self-pay | Admitting: Physician Assistant

## 2023-11-02 ENCOUNTER — Other Ambulatory Visit (INDEPENDENT_AMBULATORY_CARE_PROVIDER_SITE_OTHER): Payer: Self-pay

## 2023-11-02 ENCOUNTER — Ambulatory Visit (INDEPENDENT_AMBULATORY_CARE_PROVIDER_SITE_OTHER): Payer: BC Managed Care – PPO | Admitting: Physician Assistant

## 2023-11-02 DIAGNOSIS — Z96652 Presence of left artificial knee joint: Secondary | ICD-10-CM

## 2023-11-02 MED ORDER — TRAMADOL HCL 50 MG PO TABS: 50.0000 mg | ORAL_TABLET | Freq: Four times a day (QID) | ORAL | 0 refills | Status: DC | PRN

## 2023-11-02 NOTE — Progress Notes (Signed)
 HPI: Tammy Graham returns today status post left total knee arthroplasty 06/09/2023.  She states that she is finished with therapy.  She states she was cut off.  She continues to do home exercise program.  She is asking for refill on her Tylenol  3.  She remains out of work.  She states she still has pain in the knee at times is quick sharp pain mostly medial aspect of her knee.  And she has difficulty sleeping due to the pain in the knee.  Review of systems see HPI otherwise negative  Physical exam: General Well-developed well-nourished female in no acute distress.  Mood and affect appropriate.  Ambulates with nonantalgic gait except for the first few steps which she is stiff with.  No assistive device.  Left knee surgical incisions well-healed.  No instability valgus varus stressing.  Tenderness along medial joint line.  Anterior drawer is negative.  No abnormal warmth erythema or effusion.  Calf supple nontender.  Range of motion full extension to approximate 105 degrees of flexion.  Radiographs: AP lateral view left knee: Status post left total knee arthroplasty well-seated components.  No acute fractures acute findings.  Knee is well located.  Impression: Status post left total knee arthroplasty 06/09/2023  Plan: Will discontinue her Tylenol  3 place her on tramadol .  Have her continue to work on range of motion strengthening.  Out of work most likely return to work after next visit with Dr. Jacob in 1 month.  Questions were encouraged and answered at length.

## 2023-11-17 ENCOUNTER — Ambulatory Visit: Payer: Self-pay | Admitting: Family Medicine

## 2023-11-17 NOTE — Telephone Encounter (Addendum)
Chief Complaint: Elevated blood glucose Symptoms: Low energy, dizziness, chest pain and headache Frequency: Ongoing since August Pertinent Negatives: Patient denies relief Disposition: [x] ED /[] Urgent Care (no appt availability in office) / [] Appointment(In office/virtual)/ []  [facility]/ [] Home Care/ [x] Refused Recommended Disposition /[] [place]/ []  Follow-up with PCP Additional Notes: Patient called in to report elevated blood glucose levels. Patient has type II diabetes and takes [place]. At the time of the initial assessment, patient stated that she felt lethargic and dizzy in the morning. This RN asked the patient if she had additional/more severe symptoms and the patient got frustrated and told this RN that she had already told me all of her symptoms. Patient stated that she was anxious and frustrated and just wanted to be seen in the office because she feels that her medication is not working. This RN called the CAL to obtain approval to schedule patient in the office and no staff member was available at that time. After retrieving the patient from the hold, she stated that she is now experiencing chest pain and a headache. This RN instructed the patient to go to the ED at that time. Patient refused and stated that she wanted someone from the office to call her back with an appointment. This RN reported refusal to CAL.   Reason for Disposition  Patient sounds very sick or weak to the triager  Answer Assessment - Initial Assessment Questions 1. BLOOD GLUCOSE: "What is your blood glucose level?"      In the 270s this morning  2. ONSET: "When did you check the blood glucose?"     This morning, but states her sugars have been messed up since August  3. USUAL RANGE: "What is your glucose level usually?" (e.g., usual fasting morning value, usual evening value)     120-130s  5. TYPE 1 or 2:  "Do you know what type of diabetes you have?"  (e.g., Type 1, Type 2,  Gestational; doesn't know)      Type 2  6. INSULIN: "Do you take insulin?" "What type of insulin(s) do you use? What is the mode of delivery? (syringe, pen; injection or pump)?"      States she takes a pill  7. DIABETES PILLS: "Do you take any pills for your diabetes?" If Yes, ask: "Have you missed taking any pills recently?"     Farxiga  8. OTHER SYMPTOMS: "Do you have any symptoms?" (e.g., fever, frequent urination, difficulty breathing, dizziness, weakness, vomiting)     Low energy and feels dizzy in the morning, denies other symptoms  Protocols used: Diabetes - High Blood Sugar-A-AH

## 2023-11-17 NOTE — Telephone Encounter (Signed)
Patient has been scheduled for 11/24/2023 at 2:10 to see PCP

## 2023-11-24 ENCOUNTER — Ambulatory Visit (HOSPITAL_BASED_OUTPATIENT_CLINIC_OR_DEPARTMENT_OTHER): Payer: BC Managed Care – PPO | Admitting: Family

## 2023-11-24 ENCOUNTER — Ambulatory Visit: Payer: BC Managed Care – PPO | Attending: Family Medicine | Admitting: Family Medicine

## 2023-11-24 ENCOUNTER — Other Ambulatory Visit: Payer: Self-pay

## 2023-11-24 ENCOUNTER — Telehealth: Payer: Self-pay

## 2023-11-24 ENCOUNTER — Encounter: Payer: Self-pay | Admitting: Family Medicine

## 2023-11-24 VITALS — BP 127/88 | HR 90 | Ht 66.0 in | Wt 156.4 lb

## 2023-11-24 DIAGNOSIS — Z7984 Long term (current) use of oral hypoglycemic drugs: Secondary | ICD-10-CM

## 2023-11-24 DIAGNOSIS — E1142 Type 2 diabetes mellitus with diabetic polyneuropathy: Secondary | ICD-10-CM | POA: Diagnosis not present

## 2023-11-24 DIAGNOSIS — Z96652 Presence of left artificial knee joint: Secondary | ICD-10-CM

## 2023-11-24 DIAGNOSIS — F17210 Nicotine dependence, cigarettes, uncomplicated: Secondary | ICD-10-CM

## 2023-11-24 DIAGNOSIS — I160 Hypertensive urgency: Secondary | ICD-10-CM

## 2023-11-24 DIAGNOSIS — E1159 Type 2 diabetes mellitus with other circulatory complications: Secondary | ICD-10-CM

## 2023-11-24 DIAGNOSIS — I152 Hypertension secondary to endocrine disorders: Secondary | ICD-10-CM

## 2023-11-24 DIAGNOSIS — E785 Hyperlipidemia, unspecified: Secondary | ICD-10-CM | POA: Diagnosis not present

## 2023-11-24 DIAGNOSIS — E1169 Type 2 diabetes mellitus with other specified complication: Secondary | ICD-10-CM

## 2023-11-24 DIAGNOSIS — Z1231 Encounter for screening mammogram for malignant neoplasm of breast: Secondary | ICD-10-CM

## 2023-11-24 DIAGNOSIS — I739 Peripheral vascular disease, unspecified: Secondary | ICD-10-CM

## 2023-11-24 DIAGNOSIS — Z1211 Encounter for screening for malignant neoplasm of colon: Secondary | ICD-10-CM

## 2023-11-24 LAB — POCT GLYCOSYLATED HEMOGLOBIN (HGB A1C): HbA1c, POC (controlled diabetic range): 10.9 % — AB (ref 0.0–7.0)

## 2023-11-24 MED ORDER — FARXIGA 10 MG PO TABS
10.0000 mg | ORAL_TABLET | Freq: Every day | ORAL | 1 refills | Status: DC
  Filled 2023-11-24: qty 30, 30d supply, fill #0

## 2023-11-24 MED ORDER — ACETAMINOPHEN-CODEINE 300-30 MG PO TABS: 1.0000 | ORAL_TABLET | Freq: Every evening | ORAL | 1 refills | Status: DC | PRN

## 2023-11-24 MED ORDER — CARVEDILOL 12.5 MG PO TABS: ORAL_TABLET | ORAL | 1 refills | Status: DC

## 2023-11-24 MED ORDER — ATORVASTATIN CALCIUM 20 MG PO TABS: 20.0000 mg | ORAL_TABLET | Freq: Every day | ORAL | 1 refills | Status: DC

## 2023-11-24 MED ORDER — CHLORTHALIDONE 25 MG PO TABS: 25.0000 mg | ORAL_TABLET | Freq: Every day | ORAL | 1 refills | Status: DC

## 2023-11-24 MED ORDER — GLIPIZIDE ER 10 MG PO TB24: 20.0000 mg | ORAL_TABLET | Freq: Every day | ORAL | 1 refills | Status: DC

## 2023-11-24 NOTE — Progress Notes (Signed)
 Subjective:  Patient ID: Tammy Graham, female    DOB: [birth year]  Age: 73 y.o. MRN: [id number]  CC: Medical Management of Chronic Issues   HPI MIKAL WISMAN is a 73 y.o. year old female with a history of coronary artery disease (status post DES), type 2 diabetes mellitus (A1c 10.9), hypertension, PAD s/p right common femoral to above-knee popliteal bypass in 02/2021, status post right femoral artery stenting 02/2023, L knee OA that is post left knee replacement 05/2023, tobacco abuse.   Interval History: Discussed the use of AI scribe software for clinical note transcription with the patient, who gave verbal consent to proceed.  She presents with ongoing knee pain and high blood sugar. She reports that her knee pain has persisted despite surgery and physical therapy, which was prematurely discontinued for unknown reasons. The pain is severe enough to limit her daily activities and prevent her from returning to work. She is unable to tolerate other pain medications prescribed by her orthopedics and would like Tylenol  3 instead which she took in the past.  She also reports that her blood sugar levels have been as high as 400, despite taking her prescribed diabetes medications, including glipizide  and Farxiga . She ran out of Farxiga  last week and has been unable to afford a refill as it cost close to 200 at her regular pharmacy but she was able to obtain it at $20 at the [facility] pharmacy in the past. She also reports a recent increase in her utility bills and expresses concern about the financial burden of her medications.        Past Medical History:  Diagnosis Date   Anxiety    Arthritis    shoulder and knees   CAD (coronary artery disease)    Complication of anesthesia    states she was still awake one time when surgery started   DM (diabetes mellitus) (HCC)    Family history of adverse reaction to anesthesia    tooks alot to put to sleep, slow to awaken.   HTN  (hypertension)    Hyperlipemia    Myocardial infarction [facility])    Peripheral vascular disease (HCC)    Pneumonia    Sleep apnea    no cpap   Stroke (HCC)    2012, no residual    Past Surgical History:  Procedure Laterality Date   ABDOMINAL AORTOGRAM W/LOWER EXTREMITY Bilateral 02/11/2021   Procedure: ABDOMINAL AORTOGRAM W/LOWER EXTREMITY;  Surgeon: Sheree Penne Bruckner, MD;  Location: [facility] INVASIVE CV LAB;  Service: Cardiovascular;  Laterality: Bilateral;   ABDOMINAL AORTOGRAM W/LOWER EXTREMITY Right 02/09/2023   Procedure: ABDOMINAL AORTOGRAM W/LOWER EXTREMITY;  Surgeon: Sheree Penne Bruckner, MD;  Location: [facility] INVASIVE CV LAB;  Service: Cardiovascular;  Laterality: Right;   CARDIAC CATHETERIZATION     COLONOSCOPY     COLONOSCOPY  09/25/2011   Procedure: COLONOSCOPY;  Surgeon: Lynwood LITTIE Celestia Mickey., MD;  Location: [facility] ENDOSCOPY;  Service: Endoscopy;  Laterality: N/A;   CORONARY STENT PLACEMENT     Ostial LAD stent in 2012   FEMORAL-POPLITEAL BYPASS GRAFT Right 02/27/2021   Procedure: RIGHT COMMON FEMORAL- ABOVE KNEE POPLITEAL ARTERY BYPASS GRAFT;  Surgeon: Sheree Penne Bruckner, MD;  Location: [facility] OR;  Service: Vascular;  Laterality: Right;   Left knee surgery     PERIPHERAL VASCULAR INTERVENTION Right 02/09/2023   Procedure: PERIPHERAL VASCULAR INTERVENTION;  Surgeon: Sheree Penne Bruckner, MD;  Location: [facility] INVASIVE CV LAB;  Service: Cardiovascular;  Laterality: Right;  RT SFA  TOTAL KNEE ARTHROPLASTY Left 06/09/2023   Procedure: LEFT TOTAL KNEE ARTHROPLASTY;  Surgeon: Vernetta Lonni GRADE, MD;  Location: [facility] ORS;  Service: Orthopedics;  Laterality: Left;    Family History  Problem Relation Age of Onset   Heart attack Mother    Anesthesia problems Neg Hx    Hypotension Neg Hx    Malignant hyperthermia Neg Hx    Pseudochol deficiency Neg Hx     Social History   Socioeconomic History   Marital status: Widowed    Spouse name: Not on file   Number of children: Not  on file   Years of education: Not on file   Highest education level: Not on file  Occupational History   Not on file  Tobacco Use   Smoking status: Every Day    Current packs/day: 0.00    Average packs/day: 0.3 packs/day for 15.0 years (3.8 ttl pk-yrs)    Types: Cigarettes    Start date: 02/27/2006    Last attempt to quit: 02/27/2021    Years since quitting: 2.7   Smokeless tobacco: Never   Tobacco comments:    4-5 daily  Vaping Use   Vaping status: Never Used  Substance and Sexual Activity   Alcohol  use: Not Currently    Alcohol /week: 1.0 standard drink of alcohol     Types: 1 Standard drinks or equivalent per week   Drug use: No   Sexual activity: Not on file  Other Topics Concern   Not on file  Social History Narrative   Not on file   Social Drivers of Health   Financial Resource Strain: Low Risk  (11/24/2023)   Overall Financial Resource Strain (CARDIA)    Difficulty of Paying Living Expenses: Not very hard  Food Insecurity: No Food Insecurity (11/24/2023)   Hunger Vital Sign    Worried About Running Out of Food in the Last Year: Never true    Ran Out of Food in the Last Year: Never true  Transportation Needs: No Transportation Needs (11/24/2023)   PRAPARE - Administrator, Civil Service (Medical): No    Lack of Transportation (Non-Medical): No  Physical Activity: Insufficiently Active (11/24/2023)   Exercise Vital Sign    Days of Exercise per Week: 1 day    Minutes of Exercise per Session: 20 min  Stress: No Stress Concern Present (11/24/2023)   [employer] of Occupational Health - Occupational Stress Questionnaire    Feeling of Stress : Not at all  Social Connections: Socially Isolated (11/24/2023)   Social Connection and Isolation Panel [NHANES]    Frequency of Communication with Friends and Family: Once a week    Frequency of Social Gatherings with Friends and Family: Never    Attends Religious Services: Never    Database Administrator or Organizations:  Yes    Attends Banker Meetings: Never    Marital Status: Widowed    Allergies  Allergen Reactions   Chlorthalidone  Other (See Comments)    headache   Plavix  [Clopidogrel  Bisulfate] Itching   Rosuvastatin  Hives    Patient is allergic to generic rosuvastatin . She says she is able to take branded Crestor .    Simvastatin Other (See Comments)    unknown   Benadryl  [Diphenhydramine  Hcl (Sleep)] Rash    Outpatient Medications Prior to Visit  Medication Sig Dispense Refill   aspirin  81 MG chewable tablet Chew 1 tablet (81 mg total) by mouth 2 (two) times daily. 30 tablet 0   Blood Glucose Monitoring  Suppl (ONETOUCH VERIO) w/Device KIT Use to test blood sugar 3 times daily 1 kit 0   fluconazole  (DIFLUCAN ) 150 MG tablet TAKE 1 TABLET BY MOUTH DAILY 5 tablet 0   glucose blood (ONETOUCH VERIO) test strip Use to test blood sugar 3 times daily 100 each 12   Lancets (ONETOUCH ULTRASOFT) lancets Use to test blood sugar 3 times daily 100 each 12   Misc. Devices MISC Shower bench, Above the toilet seat.  Diagnosis- Type 2 Diabetes Mellitus 1 each 0   Misc. Devices MISC Blood pressure monitor.  Diagnosis hypertension 1 each 0   Multiple Vitamin (MULTIVITAMIN WITH MINERALS) TABS tablet Take 1 tablet by mouth in the morning.     Naphazoline-Pheniramine (VISINE OP) Place 1 drop into both eyes daily.     tiZANidine  (ZANAFLEX ) 2 MG tablet Take 1 tablet (2 mg total) by mouth every 8 (eight) hours as needed for muscle spasms. 30 tablet 1   atorvastatin  (LIPITOR) 20 MG tablet Take 1 tablet (20 mg total) by mouth daily. 90 tablet 1   carvedilol  (COREG ) 12.5 MG tablet TAKE 1 TABLET(12.5 MG) BY MOUTH TWICE DAILY WITH A MEAL 180 tablet 1   chlorthalidone  (HYGROTON ) 25 MG tablet Take 1 tablet (25 mg total) by mouth daily. 90 tablet 1   FARXIGA  10 MG TABS tablet Take 1 tablet (10 mg total) by mouth daily before breakfast. 90 tablet 1   glipiZIDE  (GLUCOTROL  XL) 10 MG 24 hr tablet TAKE 1 TABLET(10 MG) BY  MOUTH DAILY WITH BREAKFAST* DISCONTINUE IMMEDIATE RELEASE GLIPIZIDE * 90 tablet 1   nicotine  (NICODERM CQ ) 14 mg/24hr patch Place 1 patch (14 mg total) onto the skin daily. (Patient not taking: Reported on 11/24/2023) 28 patch 2   valsartan  (DIOVAN ) 320 MG tablet Take 1 tablet (320 mg total) by mouth daily. (Patient not taking: Reported on 11/24/2023) 90 tablet 1   traMADol  (ULTRAM ) 50 MG tablet Take 1 tablet (50 mg total) by mouth every 6 (six) hours as needed. (Patient not taking: Reported on 11/24/2023) 30 tablet 0   No facility-administered medications prior to visit.     ROS Review of Systems  Constitutional:  Negative for activity change and appetite change.  HENT:  Negative for sinus pressure and sore throat.   Respiratory:  Negative for chest tightness, shortness of breath and wheezing.   Cardiovascular:  Negative for chest pain and palpitations.  Gastrointestinal:  Negative for abdominal distention, abdominal pain and constipation.  Genitourinary: Negative.   Musculoskeletal:        See HPI  Psychiatric/Behavioral:  Negative for behavioral problems and dysphoric mood.     Objective:  BP 127/88   Pulse 90   Ht 5' 6 (1.676 m)   Wt 156 lb 6.4 oz (70.9 kg)   SpO2 97%   BMI 25.24 kg/m      11/24/2023    2:13 PM 06/15/2023    1:34 PM 06/15/2023    6:48 AM  BP/Weight  Systolic BP 127 128 122  Diastolic BP 88 64 71  Wt. (Lbs) 156.4    BMI 25.24 kg/m2        Physical Exam Constitutional:      Appearance: She is well-developed.  Cardiovascular:     Rate and Rhythm: Normal rate.     Heart sounds: Normal heart sounds. No murmur heard. Pulmonary:     Effort: Pulmonary effort is normal.     Breath sounds: Normal breath sounds. No wheezing or rales.  Chest:  Chest wall: No tenderness.  Abdominal:     General: Bowel sounds are normal. There is no distension.     Palpations: Abdomen is soft. There is no mass.     Tenderness: There is no abdominal tenderness.   Musculoskeletal:     Right lower leg: No edema.     Left lower leg: No edema.     Comments: Slight left knee edema with associated tenderness on range of motion  Neurological:     Mental Status: She is alert and oriented to person, place, and time.  Psychiatric:        Mood and Affect: Mood normal.        Latest Ref Rng & Units 06/10/2023    3:51 AM 05/27/2023   11:06 AM 02/09/2023    9:24 AM  CMP  Glucose 70 - 99 mg/dL 844  876  880   BUN 8 - 23 mg/dL 15  12  18    Creatinine 0.44 - 1.00 mg/dL 8.97  8.99  9.09   Sodium 135 - 145 mmol/L 137  144  144   Potassium 3.5 - 5.1 mmol/L 4.0  4.4  4.0   Chloride 98 - 111 mmol/L 103  104  108   CO2 22 - 32 mmol/L 26  26    Calcium  8.9 - 10.3 mg/dL 9.2  9.9    Total Protein 6.0 - 8.5 g/dL  6.6    Total Bilirubin 0.0 - 1.2 mg/dL  0.7    Alkaline Phos 44 - 121 IU/L  135    AST 0 - 40 IU/L  23    ALT 0 - 32 IU/L  26      Lipid Panel     Component Value Date/Time   CHOL 107 05/27/2023 1106   TRIG 62 05/27/2023 1106   HDL 50 05/27/2023 1106   CHOLHDL 2.2 02/28/2021 0155   VLDL 12 02/28/2021 0155   LDLCALC 43 05/27/2023 1106    CBC    Component Value Date/Time   WBC 11.4 (H) 06/10/2023 0351   RBC 4.26 06/10/2023 0351   HGB 14.0 06/10/2023 0351   HGB 15.3 07/01/2019 1550   HCT 41.8 06/10/2023 0351   HCT 46.5 07/01/2019 1550   PLT 179 06/10/2023 0351   PLT 257 07/01/2019 1550   MCV 98.1 06/10/2023 0351   MCV 98 (H) 07/01/2019 1550   MCH 32.9 06/10/2023 0351   MCHC 33.5 06/10/2023 0351   RDW 13.4 06/10/2023 0351   RDW 12.6 07/01/2019 1550   LYMPHSABS 2.9 09/11/2016 2316   MONOABS 0.4 09/11/2016 2316   EOSABS 0.2 09/11/2016 2316   BASOSABS 0.0 09/11/2016 2316    Lab Results  Component Value Date   HGBA1C 10.9 (A) 11/24/2023    Assessment & Plan:      Post-operative knee pain/status post left total knee replacement Persistent pain 5 months post-surgery, despite physical therapy. Patient reports difficulty with daily  activities and work due to pain. Patient has been self-managing with half-doses of Tylenol  #3. -Resume Tylenol  #3 for pain management as needed. -Consider further evaluation if pain persists.  Poorly controlled Diabetes Mellitus Patient reports high blood sugar readings (300-400), recent A1C of 10.9, up from 7.2. Patient has been without Farxiga  for a week. -Increase Glipizide  from 10mg  to 20mg  daily. -Resume Farxiga , send prescription to pharmacy downstairs for cost assistance. -Refer patient for  LIBERATE study with CPP, continuous glucose monitor to better track blood sugar levels. -Order blood tests today to assess  current glucose control.  PAD -Status post intervention -Continue high intensity statin -Risk factor modification including smoking cessation  Hypertension associated with type 2 diabetes mellitus -Controlled -Continue current antihypertensive regimen  Hyperlipidemia associated with type 2 diabetes mellitus -Controlled -Continue statin -Low-cholesterol diet   General Health Maintenance -Refer patient for colonoscopy and mammogram, as both are due. -Check feet regularly due to diabetes. -Follow-up appointment with cardiologist today. -Declines Pneumovax and flu shots.         Meds ordered this encounter  Medications   acetaminophen -codeine  (TYLENOL  #3) 300-30 MG tablet    Sig: Take 1 tablet by mouth at bedtime as needed for moderate pain (pain score 4-6).    Dispense:  30 tablet    Refill:  1   atorvastatin  (LIPITOR) 20 MG tablet    Sig: Take 1 tablet (20 mg total) by mouth daily.    Dispense:  90 tablet    Refill:  1   carvedilol  (COREG ) 12.5 MG tablet    Sig: TAKE 1 TABLET(12.5 MG) BY MOUTH TWICE DAILY WITH A MEAL    Dispense:  180 tablet    Refill:  1   chlorthalidone  (HYGROTON ) 25 MG tablet    Sig: Take 1 tablet (25 mg total) by mouth daily.    Dispense:  90 tablet    Refill:  1   FARXIGA  10 MG TABS tablet    Sig: Take 1 tablet (10 mg total)  by mouth daily before breakfast.    Dispense:  90 tablet    Refill:  1   glipiZIDE  (GLUCOTROL  XL) 10 MG 24 hr tablet    Sig: Take 2 tablets (20 mg total) by mouth daily with breakfast.    Dispense:  90 tablet    Refill:  1    **Patient requests 90 days supply**    Follow-up: Return in about 3 months (around 02/21/2024) for Chronic medical conditions.       Corrina Sabin, MD, FAAFP. [facility] [place], [place] [phone]   11/24/2023, 3:34 PM

## 2023-11-24 NOTE — Telephone Encounter (Signed)
Tammy Graham medication is not covered by patients insurance at [facility] [employer]

## 2023-11-24 NOTE — Telephone Encounter (Signed)
Do we have any SGLT2i covered? Thanks

## 2023-11-24 NOTE — Patient Instructions (Signed)
 VISIT SUMMARY:  During today's visit, we discussed your ongoing knee pain and high blood sugar levels. We reviewed your current medications and made some adjustments to better manage your conditions. We also talked about some general health maintenance steps that are due.  YOUR PLAN:  -POST-OPERATIVE KNEE PAIN: Post-operative knee pain is pain that persists after surgery. Your knee pain has continued for 5 months and is affecting your daily activities. We recommend resuming Tylenol  #3 as needed for pain management. If the pain continues, we may need to consider further evaluation.  -POORLY CONTROLLED DIABETES MELLITUS: Diabetes Mellitus is a condition where your blood sugar levels are too high. Your recent blood sugar readings have been very high, and your A1C has increased. We are increasing your Glipizide  dose to 20mg  daily and resuming Farxiga  with cost assistance from the pharmacy downstairs. We are also referring you for a continuous glucose monitor to better track your blood sugar levels and ordering blood tests today to assess your current glucose control.  -GENERAL HEALTH MAINTENANCE: General health maintenance includes routine check-ups and screenings to keep you healthy. We are referring you for a colonoscopy and mammogram, as both are due. Please check your feet regularly due to diabetes, and remember your follow-up appointment with the cardiologist today.  INSTRUCTIONS:  Please follow up with the recommended blood tests today and pick up your Farxiga  prescription from the pharmacy downstairs. Schedule your colonoscopy and mammogram appointments. Continue to check your feet regularly and attend your follow-up appointment with the cardiologist today.

## 2023-11-25 ENCOUNTER — Telehealth: Payer: Self-pay | Admitting: Pharmacist

## 2023-11-25 ENCOUNTER — Encounter: Payer: Self-pay | Admitting: Family Medicine

## 2023-11-25 ENCOUNTER — Other Ambulatory Visit: Payer: Self-pay | Admitting: Pharmacist

## 2023-11-25 LAB — BASIC METABOLIC PANEL
BUN/Creatinine Ratio: 16 (ref 12–28)
BUN: 20 mg/dL (ref 8–27)
CO2: 27 mmol/L (ref 20–29)
Calcium: 10.5 mg/dL — ABNORMAL HIGH (ref 8.7–10.3)
Chloride: 95 mmol/L — ABNORMAL LOW (ref 96–106)
Creatinine, Ser: 1.24 mg/dL — ABNORMAL HIGH (ref 0.57–1.00)
Glucose: 269 mg/dL — ABNORMAL HIGH (ref 70–99)
Potassium: 3.6 mmol/L (ref 3.5–5.2)
Sodium: 139 mmol/L (ref 134–144)
eGFR: 46 mL/min/{1.73_m2} — ABNORMAL LOW (ref >59)

## 2023-11-25 MED ORDER — EMPAGLIFLOZIN 25 MG PO TABS: 25.0000 mg | ORAL_TABLET | Freq: Every day | ORAL | 3 refills | Status: DC

## 2023-11-25 NOTE — Telephone Encounter (Signed)
 LIBERATE Study  Received referral for patient participation in the LIBERATE CGM Study. Contacted patient to discuss study and confirmed HIPAA identifiers. Confirmed patient was provided the LIBERATE Study Information Sheet and any questions were answered.   Confirmed that patient meets study criteria by having a diagnosis of Type 2 Diabetes, is not currently on insulin , and most recent A1c is >8%.  Patient provided verbal consent to participate in the study. Consent documented in electronic medical record.   - Confirmed that patient has a compatible smart phone to download [place] 3 app. ([email or web address]) - Asked to download and create a Herlene account prior to first study visit.  - Scheduled first study visit on 12/08/2023 @11a . Confirmed patient has transportation to this appointment.  - Discussed use of MyChart in this study. Confirmed patient has an active MyChart account and is aware of their log in information.  Patient aware of pre-visit questionnaire that will be sent 2 days prior to their scheduled study visit and they will plan to complete before the visit.   Herlene Fleeta Morris, PharmD, JAQUELINE, CPP Clinical Pharmacist [facility] [phone]

## 2023-12-02 ENCOUNTER — Encounter: Payer: Self-pay | Admitting: Orthopaedic Surgery

## 2023-12-02 ENCOUNTER — Ambulatory Visit (INDEPENDENT_AMBULATORY_CARE_PROVIDER_SITE_OTHER): Payer: BC Managed Care – PPO | Admitting: Orthopaedic Surgery

## 2023-12-02 DIAGNOSIS — Z96652 Presence of left artificial knee joint: Secondary | ICD-10-CM

## 2023-12-02 MED ORDER — ACETAMINOPHEN-CODEINE 300-30 MG PO TABS: 1.0000 | ORAL_TABLET | Freq: Every evening | ORAL | 0 refills | Status: AC | PRN

## 2023-12-02 NOTE — Progress Notes (Signed)
The patient is a 74 year old female who is now almost 6 months status post a left total knee arthroplasty.  She feels like she is making progress and at this point does need some Tylenol 3 on occasion to help her sleep at night.  She would like at least 1 more prescription for this because tramadol caused too much itching for her.  She feels like she is also ready to go back to work completely.  Examination of the left knee shows still some swelling to be expected.  She lacks full extension by about 3 degrees and I can flex her to about 95 degrees.  Overall it feels stable.  I did send in some Tylenol 3 for her and have let her know to use the sparingly.  We filled out information for her going back to full work duties without restrictions.  We can see her back in 6 months with a standing AP and lateral of her left operative knee.  If there are issues before then she knows to reach out and let us know.

## 2023-12-08 ENCOUNTER — Telehealth: Payer: Self-pay | Admitting: Pharmacist

## 2023-12-08 ENCOUNTER — Ambulatory Visit: Payer: BC Managed Care – PPO | Attending: Family Medicine | Admitting: Pharmacist

## 2023-12-08 DIAGNOSIS — E1142 Type 2 diabetes mellitus with diabetic polyneuropathy: Secondary | ICD-10-CM

## 2023-12-08 DIAGNOSIS — E1169 Type 2 diabetes mellitus with other specified complication: Secondary | ICD-10-CM

## 2023-12-08 DIAGNOSIS — E785 Hyperlipidemia, unspecified: Secondary | ICD-10-CM

## 2023-12-08 DIAGNOSIS — I160 Hypertensive urgency: Secondary | ICD-10-CM

## 2023-12-08 DIAGNOSIS — I739 Peripheral vascular disease, unspecified: Secondary | ICD-10-CM

## 2023-12-08 DIAGNOSIS — E1159 Type 2 diabetes mellitus with other circulatory complications: Secondary | ICD-10-CM

## 2023-12-08 DIAGNOSIS — Z7984 Long term (current) use of oral hypoglycemic drugs: Secondary | ICD-10-CM

## 2023-12-08 DIAGNOSIS — I152 Hypertension secondary to endocrine disorders: Secondary | ICD-10-CM

## 2023-12-08 LAB — POCT GLYCOSYLATED HEMOGLOBIN (HGB A1C): HbA1c, POC (controlled diabetic range): 11.1 % — AB (ref 0.0–7.0)

## 2023-12-08 MED ORDER — CARVEDILOL 12.5 MG PO TABS: ORAL_TABLET | ORAL | 1 refills | Status: DC

## 2023-12-08 MED ORDER — TRULICITY 0.75 MG/0.5ML ~~LOC~~ SOAJ: 0.7500 mg | SUBCUTANEOUS | 1 refills | Status: DC

## 2023-12-08 MED ORDER — GLIPIZIDE ER 10 MG PO TB24: 20.0000 mg | ORAL_TABLET | Freq: Every day | ORAL | 1 refills | Status: DC

## 2023-12-08 MED ORDER — ATORVASTATIN CALCIUM 20 MG PO TABS: 20.0000 mg | ORAL_TABLET | Freq: Every day | ORAL | 1 refills | Status: DC

## 2023-12-08 MED ORDER — CHLORTHALIDONE 25 MG PO TABS: 25.0000 mg | ORAL_TABLET | Freq: Every day | ORAL | 1 refills | Status: DC

## 2023-12-08 MED ORDER — VALSARTAN 320 MG PO TABS: 320.0000 mg | ORAL_TABLET | Freq: Every day | ORAL | 1 refills | Status: DC

## 2023-12-08 NOTE — Telephone Encounter (Signed)
 Opened in error

## 2023-12-08 NOTE — Telephone Encounter (Signed)
 Can we start a PA for this patient's Trulicity?

## 2023-12-08 NOTE — Progress Notes (Signed)
S:     No chief complaint on file.  73 y.o. female who presents for diabetes evaluation, education, and management in the context of the LIBERATE study. Patient arrives in  good spirits and presents without any assistance.  Patient was referred and last seen by Primary Care Provider, Dr. Alvis Lemmings, on 11/24/23. A1c was 10.9, up from 7.2. Patient was referred to join LIBERATE study. Reported that her knee pain has persisted severe enough to limit her daily activities and prevent her from returning to work. Reported that her blood sugar levels have been as high as 400 despite DM meds (had been out of [place] for 1 week). Expressed concerns with financial burdens for her medications.   PMH is significant for coronary artery disease (status post DES), type 2 diabetes mellitus,  hypertension, PAD s/p right common femoral to above-knee popliteal bypass in 02/2021, status post right femoral artery stenting 02/2023, L knee OA that is post left knee replacement 05/2023, tobacco abuse (tried to quit in 02/27/21).   Patient reports losing her insurance after losing her job couple months ago. Expresses interest in applying for Medicaid. Reports intolerance to metformin increasing leg pain. Unfortunately, she has not picked up any medication in several weeks.   Family/Social History:  Fhx: heart attack (mother) Tobacco: 0.3 packs/day for 15 yrs  Current diabetes medications include: glipizide 10 mg (has not picked up since 07/2023), empagliflozin 25 mg (not taking due to cost) Current hypertension medications include: carvedilol 12.5 mg, chlorthalidone 25 mg, valsartan 320 mg Current hyperlipidemia medications include: atorvastatin 20 mg  Patient denies adherence with medications, reports missing DM medications. Has not been taking past 4 weeks because of outstanding copay.   Insurance coverage: BCBS   Patient denies hypoglycemic events.  Reported home fasting blood sugars: 370   Patient denies nocturia  (nighttime urination).  Patient reports neuropathy (nerve pain). Patient denies visual changes. Patient self foot exams not reported.  Patient reported dietary habits: none reported  Patient-reported exercise habits:  none reported  O:   Lab Results  Component Value Date   HGBA1C 10.9 (A) 11/24/2023   There were no vitals filed for this visit.  Lipid Panel     Component Value Date/Time   CHOL 107 05/27/2023 1106   TRIG 62 05/27/2023 1106   HDL 50 05/27/2023 1106   CHOLHDL 2.2 02/28/2021 0155   VLDL 12 02/28/2021 0155   LDLCALC 43 05/27/2023 1106    Clinical Atherosclerotic Cardiovascular Disease (ASCVD): Yes  The ASCVD Risk score (Arnett DK, et al., 2019) failed to calculate for the following reasons:   Risk score cannot be calculated because patient has a medical history suggesting prior/existing ASCVD   Patient is participating in a Managed Medicaid Plan:  No   A/P:  LIBERATE Study:  -Patient provided verbal consent to participate in the study. Consent documented in electronic medical record.  -Provided education on Libre 3 CGM. Collaborated to ensure Josephine Igo 3 app was downloaded on patient's phone. Educated on how to place sensor every 14 days, patient placed first sensor correctly and verbalized understanding of use, removal, and how to place next sensor. Discussed alarms. 8 sensors provided for a 3 month supply. Educated to contact the office if the sensor falls off early and replacements are needed before their next [employer].   Diabetes longstanding and currently uncontrolled. A1c is 11.1. Enrolled in LIBERATE study. Patient is able to verbalize appropriate hypoglycemia management plan. Medication adherence appears suboptimal. Control is suboptimal due  to copay costs. Counseled patient on applying to Medicaid.  -Stopped Jardiance (empagliflozin) 10 mg.  -Continued glipizide 20 mg.  -Started Trulicity 0.75 mg. -Resent previously ordered medications in to  [place] on [address] in [place], [place] per patient preference.  -Directed patient to Medicaid office on 4th floor to apply for Medicaid.  -Patient educated on purpose, proper use, and potential adverse effects of Trulicity.  -Extensively discussed pathophysiology of diabetes, recommended lifestyle interventions, dietary effects on blood sugar control.  -Counseled on s/sx of and management of hypoglycemia.  -Next A1c anticipated 5/25.   ASCVD risk-secondary prevention in patient with diabetes. Last LDL is 43 at goal of <50 mg/dL. Consider increasing to 40 mg atorvastatin in future. -Continued atorvastatin 20  mg.   Hypertension longstanding. Blood pressure goal of <130/80 mmHg.  -Continued  carvedilol 12.5 mg -Continued  chlorthalidone 25 mg -Continued valsartan 320 mg  Written patient instructions provided. Patient verbalized understanding of treatment plan.  Total time in face to face counseling 50 minutes.    Follow-up:  Pharmacist 01/11/24 PCP clinic visit in 06/01/2024  Patient seen with  Haywood Filler, PharmD Candidate [employer]  Class of 2027  Butch Penny, PharmD, [place], CPP Clinical Pharmacist [facility] & [facility] [phone]

## 2023-12-11 ENCOUNTER — Other Ambulatory Visit: Payer: Self-pay

## 2023-12-14 ENCOUNTER — Other Ambulatory Visit: Payer: Self-pay

## 2024-01-10 NOTE — Progress Notes (Deleted)
 S:     No chief complaint on file.  73 y.o. female who presents for diabetes evaluation, education, and management in the context of the LIBERATE study. Patient arrives in  good spirits and presents without any assistance.  Patient was referred and last seen by Primary Care Provider, Dr. Alvis Lemmings, on 11/24/23. A1c was 10.9, up from 7.2. Patient was referred to join LIBERATE study. Reported that her knee pain has persisted severe enough to limit her daily activities and prevent her from returning to work. Reported that her blood sugar levels have been as high as 400 despite DM meds (had been out of [place] for 1 week). Expressed concerns with financial burdens for her medications. At pharmacy visit on 12/08/23, patient was enrolled in LIBERATE. She reported being out of all of her medications due to cost/lack of insurance. A1c was elevated to 11.1%. Instructed her to stop Jardiance and start Trulicity 0.75 mg weekly. She was also provided information on applying to Medicaid.   PMH is significant for coronary artery disease (status post DES), type 2 diabetes mellitus, hypertension, PAD s/p right common femoral to above-knee popliteal bypass in 02/2021, status post right femoral artery stenting 02/2023, L knee OA that is post left knee replacement 05/2023, tobacco abuse (tried to quit in 02/27/21).   Today *** Does not appear any meds have been picked up since last visit. Appears she has insurance now as trulicity PA was approved.  Test claims?   Patient reports losing her insurance after losing her job couple months ago. Expresses interest in applying for Medicaid. Reports intolerance to metformin increasing leg pain. Unfortunately, she has not picked up any medication in several weeks. ***  Family/Social History:  Fhx: heart attack (mother) Tobacco: 0.3 packs/day for 15 yrs  Current diabetes medications include: glipizide 10 mg (has not picked up since 07/2023), Trulicity 0.75 mg weekly (not picked  up - PA approved on 12/14/23) Current hypertension medications include: carvedilol 12.5 mg, chlorthalidone 25 mg, valsartan 320 mg Current hyperlipidemia medications include: atorvastatin 20 mg  Patient denies adherence with medications, reports missing DM medications. Has not been taking past 4 weeks because of outstanding copay.   Insurance coverage: BCBS   Patient denies hypoglycemic events.***  Reported home fasting blood sugars: 370 ***  Patient denies nocturia (nighttime urination). *** Patient reports neuropathy (nerve pain). Patient denies visual changes. Patient self foot exams not reported.  Patient reported dietary habits: none reported  Patient-reported exercise habits:  none reported  O:  BP Readings from Last 3 Encounters:  11/24/23 127/88  06/15/23 128/64  06/05/23 (!) 140/82    Lab Results  Component Value Date   HGBA1C 11.1 (A) 12/08/2023   There were no vitals filed for this visit.  Lipid Panel     Component Value Date/Time   CHOL 107 05/27/2023 1106   TRIG 62 05/27/2023 1106   HDL 50 05/27/2023 1106   CHOLHDL 2.2 02/28/2021 0155   VLDL 12 02/28/2021 0155   LDLCALC 43 05/27/2023 1106    Clinical Atherosclerotic Cardiovascular Disease (ASCVD): Yes  The ASCVD Risk score (Arnett DK, et al., 2019) failed to calculate for the following reasons:   Risk score cannot be calculated because patient has a medical history suggesting prior/existing ASCVD   Patient is participating in a Managed Medicaid Plan:  No   Med adherence, medicaid Start GLP1? Statin incr for PAD, make sure taking ASA with ASCVD hx Check BP? 11/24/23 BMP elevated Cr and calcium?  A/P:  Diabetes longstanding and currently uncontrolled. A1c is 11.1. Enrolled in LIBERATE study. Patient is able to verbalize appropriate hypoglycemia management plan. Medication adherence appears suboptimal. Control is suboptimal due to copay costs. Counseled patient on applying to Medicaid.  -Stopped  Jardiance (empagliflozin) 10 mg.  -Continued glipizide 20 mg.  -Started Trulicity 0.75 mg. -Resent previously ordered medications in to [place] on [address] in [place], [place] per patient preference.  -Directed patient to Medicaid office on 4th floor to apply for Medicaid.  -Patient educated on purpose, proper use, and potential adverse effects of Trulicity.  -Extensively discussed pathophysiology of diabetes, recommended lifestyle interventions, dietary effects on blood sugar control.  -Counseled on s/sx of and management of hypoglycemia.  -Next A1c anticipated 5/25.   ASCVD risk-secondary prevention in patient with diabetes. Last LDL is 43 at goal of <50 mg/dL. Consider increasing to 40 mg atorvastatin in future. -Continued atorvastatin 20  mg.  (Not picked up since Nov 2024)***  Hypertension longstanding. Blood pressure goal of <130/80 mmHg. None picked up*** -Continued  carvedilol 12.5 mg -Continued  chlorthalidone 25 mg -Continued valsartan 320 mg  Written patient instructions provided. Patient verbalized understanding of treatment plan.  Total time in face to face counseling 50 minutes.    Follow-up:  Pharmacist 01/11/24- schedule Liberate f/u in May 2025? Or come back to pharmacy earlier - April first PCP clinic visit: needs to be scheduled  Nils Pyle, PharmD PGY1 Pharmacy Resident  Butch Penny, PharmD, BCACP, CPP Clinical Pharmacist [facility] & [facility] [phone]

## 2024-01-11 ENCOUNTER — Ambulatory Visit: Payer: BC Managed Care – PPO | Admitting: Pharmacist

## 2024-01-12 ENCOUNTER — Ambulatory Visit: Attending: Family Medicine | Admitting: Pharmacist

## 2024-01-12 ENCOUNTER — Encounter: Payer: Self-pay | Admitting: Pharmacist

## 2024-01-12 DIAGNOSIS — E119 Type 2 diabetes mellitus without complications: Secondary | ICD-10-CM | POA: Diagnosis not present

## 2024-01-12 DIAGNOSIS — Z7985 Long-term (current) use of injectable non-insulin antidiabetic drugs: Secondary | ICD-10-CM

## 2024-01-12 DIAGNOSIS — Z7984 Long term (current) use of oral hypoglycemic drugs: Secondary | ICD-10-CM

## 2024-01-12 NOTE — Progress Notes (Signed)
 S:     No chief complaint on file.  73 y.o. female who presents for diabetes evaluation, education, and management in the context of the LIBERATE study. Patient arrives in  good spirits and presents without any assistance.  Patient was referred and last seen by Primary Care Provider, Dr. Alvis Lemmings, on 11/24/23. A1c was 10.9, up from 7.2. Patient was referred to join LIBERATE study. At pharmacy visit on 12/08/23, patient was enrolled in LIBERATE. She reported being out of all of her medications due to cost/lack of insurance. A1c was elevated to 11.1%. Instructed her to stop Jardiance and start Trulicity 0.75 mg weekly. She was also provided information on applying to Medicaid.   PMH is significant for coronary artery disease (status post DES), type 2 diabetes mellitus, hypertension, PAD s/p right common femoral to above-knee popliteal bypass in 02/2021, status post right femoral artery stenting 02/2023, L knee OA that is post left knee replacement 05/2023, tobacco abuse (tried to quit in 02/27/21).   Today, pt reports doing better. Trulicity has been approved but copay is expensive. She completed 4 injections but cannot afford to refill it. She has applied for Medicaid but told she didn't qualify. She is unsure if [facility] is using the Trulicity copay card. While she was on the Trulicity, her CBG avgs ranged from 160s-180s. Her complete AGP report is listed below.   Family/Social History:  Fhx: heart attack (mother) Tobacco: 0.3 packs/day for 15 yrs  Current diabetes medications include: glipizide 10 mg (filled 12/09/23 for a 90-day), Trulicity 0.75 mg weekly (filled for a 28-day 12/09/2023)  Patient reports adherence with medications but ran out of Trulicity last week.   Insurance coverage: BCBS   Patient denies hypoglycemic events.  Patient denies nocturia (nighttime urination).  Patient reports neuropathy (nerve pain). Patient denies visual changes. Patient self foot exams not  reported.  Patient reported dietary habits: none reported  Patient-reported exercise habits:  none reported  O:  BP Readings from Last 3 Encounters:  11/24/23 127/88  06/15/23 128/64  06/05/23 (!) 140/82   Date of Download: 01/12/24 for a 28 day report % Time CGM is active: 94% Average Glucose: 188 mg/dL Glucose Management Indicator: 7.8  Glucose Variability: 22 (goal <36%) Time in Goal:  - Time in range 70-180: 45% - Time above range: 55% - Time below range: 0%  Lab Results  Component Value Date   HGBA1C 11.1 (A) 12/08/2023   There were no vitals filed for this visit.  Lipid Panel     Component Value Date/Time   CHOL 107 05/27/2023 1106   TRIG 62 05/27/2023 1106   HDL 50 05/27/2023 1106   CHOLHDL 2.2 02/28/2021 0155   VLDL 12 02/28/2021 0155   LDLCALC 43 05/27/2023 1106    Clinical Atherosclerotic Cardiovascular Disease (ASCVD): Yes  The ASCVD Risk score (Arnett DK, et al., 2019) failed to calculate for the following reasons:   Risk score cannot be calculated because patient has a medical history suggesting prior/existing ASCVD   Patient is participating in a Managed Medicaid Plan:  No   A/P: Diabetes longstanding and currently uncontrolled. However, home CGM data reveals improvement. She is enrolled in LIBERATE study. She is asymptomatic at this time and is able to verbalize appropriate hypoglycemia management plan. Medication adherence appears suboptimal d/t insurance copays.  -Continued glipizide 20 mg for now.  -Resume Trulicity 0.75 mg. Signed patient up for Trulicity copay card. She will let me know if the copay is still cost-prohibitive.  -  Unfortunately, she does not qualify for Medicaid. -Patient educated on purpose, proper use, and potential adverse effects of Trulicity.  -Extensively discussed pathophysiology of diabetes, recommended lifestyle interventions, dietary effects on blood sugar control.  -Counseled on s/sx of and management of hypoglycemia.   -Next A1c anticipated 5/25.   Written patient instructions provided. Patient verbalized understanding of treatment plan.  Total time in face to face counseling 50 minutes.    Follow-up:  Pharmacist 02/16/24.LIBERATE 3 month follow-up will be scheduled for May at that time.  Butch Penny, PharmD, [place], CPP Clinical Pharmacist [facility] & [facility] [phone]

## 2024-02-16 ENCOUNTER — Telehealth: Payer: Self-pay | Admitting: Pharmacist

## 2024-02-16 ENCOUNTER — Encounter: Payer: Self-pay | Admitting: Pharmacist

## 2024-02-16 ENCOUNTER — Telehealth: Payer: Self-pay

## 2024-02-16 ENCOUNTER — Ambulatory Visit: Attending: Family Medicine | Admitting: Pharmacist

## 2024-02-16 DIAGNOSIS — Z7985 Long-term (current) use of injectable non-insulin antidiabetic drugs: Secondary | ICD-10-CM | POA: Diagnosis not present

## 2024-02-16 DIAGNOSIS — E119 Type 2 diabetes mellitus without complications: Secondary | ICD-10-CM | POA: Diagnosis not present

## 2024-02-16 DIAGNOSIS — Z7984 Long term (current) use of oral hypoglycemic drugs: Secondary | ICD-10-CM | POA: Diagnosis not present

## 2024-02-16 MED ORDER — AMOXICILLIN-POT CLAVULANATE 875-125 MG PO TABS: 1.0000 | ORAL_TABLET | Freq: Two times a day (BID) | ORAL | 0 refills | Status: DC

## 2024-02-16 MED ORDER — TRULICITY 0.75 MG/0.5ML ~~LOC~~ SOAJ: 0.7500 mg | SUBCUTANEOUS | 2 refills | Status: DC

## 2024-02-16 NOTE — Telephone Encounter (Signed)
 Hey Dr. Newlin,   I'm sorry to bother your. Saw patient today for DM/Liberate. Her official 3 month follow-up is next month. Today, she reported ~1 week history of productive cough (yellow-ish sputum production), sore throat, and post nasal drip. No fever or chills. Endorses generalized fatigue. Denies any sick contacts. She tells me she believes she has a sinus infection and has been written antibiotics for it in the past with success. Denies any allergies to any previously-tried antibiotics and I note no allergies to antibiotics in her allergies section.   I told her I could not write for antibiotics but I would pass along information to you to see if you would consider.

## 2024-02-16 NOTE — Telephone Encounter (Unsigned)
 Copied from CRM [id number]. Topic: Clinical - Medication Question >> Feb 16, 2024  4:20 PM Tammy Graham wrote: Reason for CRM: Patient is calling about a prescription for sinus infection medication as discussed during her appointment today. The prescription has not been called into her pharmacy. Callback number is [phone]

## 2024-02-16 NOTE — Addendum Note (Signed)
 Addended by: Dominick Morella on: 02/16/2024 09:41 PM   Modules accepted: Orders

## 2024-02-16 NOTE — Telephone Encounter (Signed)
 I have sent a Prescription for an antibiotics to her pharmacy

## 2024-02-16 NOTE — Telephone Encounter (Signed)
 Patient saw luke today and she states that she is having a sinus infection and requesting medication.

## 2024-02-16 NOTE — Telephone Encounter (Signed)
 Duplicate message.

## 2024-02-16 NOTE — Progress Notes (Signed)
 S:     No chief complaint on file.  73 y.o. female who presents for diabetes evaluation, education, and management in the context of the LIBERATE study. Patient arrives in  good spirits and presents without any assistance. PMH is significant for coronary artery disease (status post DES), type 2 diabetes mellitus, hypertension, PAD s/p right common femoral to above-knee popliteal bypass in 02/2021, status post right femoral artery stenting 02/2023, L knee OA that is post left knee replacement 05/2023, tobacco abuse (tried to quit in 02/27/21).   Patient was referred and last seen by Primary Care Provider, Dr. Adan Holms, on 11/24/23. A1c was 10.9 (up from 7.2 previously). Patient was referred to join LIBERATE study. I enrolled her 12/08/2023. Last appt with me was on 01/12/2024. She had some trouble with affordability but when taking consistently, the Trulicity really helped her. I helped her download a Trulicity copay card to use.  Today, pt reports doing better. Trulicity has been approved but copay remains expensive, even with the use of the copay card. Initially, she completed 4 injections in February but then went without for about ~1 month before resuming the medication on 01/13/2024. Indeed, per dispense report she last filled Trulicity for a 4 week supply on 01/13/2024. Her complete AGP report is listed below.   Family/Social History:  Fhx: heart attack (mother) Tobacco: 0.3 packs/day for 15 yrs  Current diabetes medications include: glipizide  10 mg (filled 12/09/23 for a 90-day), Trulicity 0.75 mg weekly (filled for a 28-day 01/13/2024) Patient reports adherence with medications but ran out of Trulicity last week.   Insurance coverage: BCBS   Patient denies hypoglycemic events.  Patient denies nocturia (nighttime urination).  Patient reports neuropathy (nerve pain). Patient denies visual changes. Patient self foot exams not reported.  Patient reported dietary habits: none  reported  Patient-reported exercise habits:  none reported  O:  BP Readings from Last 3 Encounters:  11/24/23 127/88  06/15/23 128/64  06/05/23 (!) 140/82   Date of Download: 02/16/24 for a 60 day report % Time CGM is active: 98% Average Glucose: 190 mg/dL Glucose Management Indicator: 7.9  Glucose Variability: 22 (goal <36%) Time in Goal:  - Time in range 70-180: 44% - Time above range: 56% - Time below range: 0%  Lab Results  Component Value Date   HGBA1C 11.1 (A) 12/08/2023   There were no vitals filed for this visit.  Lipid Panel     Component Value Date/Time   CHOL 107 05/27/2023 1106   TRIG 62 05/27/2023 1106   HDL 50 05/27/2023 1106   CHOLHDL 2.2 02/28/2021 0155   VLDL 12 02/28/2021 0155   LDLCALC 43 05/27/2023 1106    Clinical Atherosclerotic Cardiovascular Disease (ASCVD): Yes  The ASCVD Risk score (Arnett DK, et al., 2019) failed to calculate for the following reasons:   Risk score cannot be calculated because patient has a medical history suggesting prior/existing ASCVD   Patient is participating in a Managed Medicaid Plan:  No   A/P: Diabetes longstanding and currently uncontrolled. However, home CGM data reveals improvement. She is enrolled in LIBERATE study. She is asymptomatic at this time and is able to verbalize appropriate hypoglycemia management plan. Medication adherence appears suboptimal d/t insurance copays. She is amenable to continuing Trulicity. She endorses some itching on her arms after injection but denies any hives, rash, or other symptoms of hypersensitivity. Additionally, she voices concern over reading that you shouldn't take Trulicity with thyroid problems. I offered counseling that these warning  pertain to a specific type of thyroid cancer. She has no known diagnosis of this. After counseling, she is amenable to resuming Trulicity.  -Resume Trulicity 0.75 mg.  -Continue glipizide  at current dosing. -Unfortunately, she does not qualify  for Medicaid. -Patient educated on purpose, proper use, and potential adverse effects of Trulicity.  -Extensively discussed pathophysiology of diabetes, recommended lifestyle interventions, dietary effects on blood sugar control.  -Counseled on s/sx of and management of hypoglycemia.  -Next A1c anticipated 5/25.   Written patient instructions provided. Patient verbalized understanding of treatment plan.  Total time in face to face counseling 50 minutes.    Follow-up:  Pharmacist .LIBERATE 3 month follow-up: 03/15/2024  Marene Shape, PharmD, BCACP, CPP Clinical Pharmacist [facility] & [facility] [phone]

## 2024-02-17 ENCOUNTER — Other Ambulatory Visit: Payer: Self-pay | Admitting: Pharmacist

## 2024-02-17 MED ORDER — AMOXICILLIN-POT CLAVULANATE 875-125 MG PO TABS: 1.0000 | ORAL_TABLET | Freq: Two times a day (BID) | ORAL | 0 refills | Status: AC

## 2024-03-15 ENCOUNTER — Other Ambulatory Visit: Payer: Self-pay

## 2024-03-15 ENCOUNTER — Encounter: Payer: Self-pay | Admitting: Pharmacist

## 2024-03-15 ENCOUNTER — Ambulatory Visit: Attending: Nurse Practitioner | Admitting: Pharmacist

## 2024-03-15 DIAGNOSIS — Z7984 Long term (current) use of oral hypoglycemic drugs: Secondary | ICD-10-CM

## 2024-03-15 DIAGNOSIS — E119 Type 2 diabetes mellitus without complications: Secondary | ICD-10-CM | POA: Diagnosis not present

## 2024-03-15 DIAGNOSIS — Z7985 Long-term (current) use of injectable non-insulin antidiabetic drugs: Secondary | ICD-10-CM | POA: Diagnosis not present

## 2024-03-15 LAB — POCT GLYCOSYLATED HEMOGLOBIN (HGB A1C): HbA1c, POC (controlled diabetic range): 10.2 % — AB (ref 0.0–7.0)

## 2024-03-15 MED ORDER — LANTUS SOLOSTAR 100 UNIT/ML ~~LOC~~ SOPN: 10.0000 [IU] | PEN_INJECTOR | Freq: Every day | SUBCUTANEOUS | 2 refills | Status: DC

## 2024-03-15 MED ORDER — TRULICITY 0.75 MG/0.5ML ~~LOC~~ SOAJ
0.7500 mg | SUBCUTANEOUS | 0 refills | Status: DC
  Filled 2024-03-15: qty 2, 28d supply, fill #0

## 2024-03-15 MED ORDER — PEN NEEDLES 32G X 4 MM MISC: 2 refills | Status: DC

## 2024-03-15 MED ORDER — PEN NEEDLES 32G X 4 MM MISC
2 refills | Status: DC
  Filled 2024-03-15: qty 100, 30d supply, fill #0

## 2024-03-15 MED ORDER — BASAGLAR KWIKPEN 100 UNIT/ML ~~LOC~~ SOPN
10.0000 [IU] | PEN_INJECTOR | Freq: Every day | SUBCUTANEOUS | 2 refills | Status: DC
  Filled 2024-03-15: qty 3, 30d supply, fill #0

## 2024-03-15 MED ORDER — FLUCONAZOLE 150 MG PO TABS: 150.0000 mg | ORAL_TABLET | Freq: Every day | ORAL | 0 refills | Status: DC

## 2024-03-15 NOTE — Progress Notes (Signed)
 S:     No chief complaint on file.  73 y.o. female who presents for diabetes evaluation, education, and management in the context of the LIBERATE Study.  Today, patient arrives in good spirits and presents without any assistance.  Patient was referred and last seen by Primary Care Provider, Dr. Adan Holms, on 11/24/23. A1c was 10.9 (up from 7.2 previously). Patient was referred to join LIBERATE study. I enrolled her 12/08/2023. Last appt with me was on 02/16/2024.   She continues to have some trouble with affordability but when taking consistently, the Trulicity really helped her. At last visit, I downloaded her Trulicity copay card. Unfortunately, cost was still >$200. Since that visit, she has only been taking glipizide  for diabetes.    Family/Social History:  Fhx: heart attack (mother) Tobacco: 0.3 packs/day for 15 yrs  Current diabetes medications include: glipizide  10 mg (filled 12/09/23 for a 90-day), Trulicity 0.75 mg weekly (pt stopped d/t cost)   Patient reports adherence to taking all medications as prescribed.   Insurance coverage: BCBS  Patient denies hypoglycemic events.  Patient denies nocturia (nighttime urination).  Patient reports neuropathy (nerve pain). Patient denies visual changes. Patient reports self foot exams.   Patient reported dietary habits: none reported  Patient-reported exercise habits: none    O:   ROS  Physical Exam  Libre3 CGM Download today 2/21-5/21/25 % Time CGM is active: 99% Average Glucose: 223 mg/dL Glucose Management Indicator: 8.6  Glucose Variability: 32.9% (goal <36%) Time in Goal:  - Time in range 70-13180: 31% - Time above range: 69% - Time below range: 0%:  Lab Results  Component Value Date   HGBA1C 10.2 (A) 03/15/2024     There were no vitals filed for this visit.   Lipid Panel     Component Value Date/Time   CHOL 107 05/27/2023 1106   TRIG 62 05/27/2023 1106   HDL 50 05/27/2023 1106   CHOLHDL 2.2 02/28/2021  0155   VLDL 12 02/28/2021 0155   LDLCALC 43 05/27/2023 1106    Clinical Atherosclerotic Cardiovascular Disease (ASCVD): Yes  The ASCVD Risk score (Arnett DK, et al., 2019) failed to calculate for the following reasons:   Risk score cannot be calculated because patient has a medical history suggesting prior/existing ASCVD   Patient is participating in a Managed Medicaid Plan: No     A/P:  LIBERATE Study:  - 6 sensors provided for a 3 month supply. Educated to contact the office if the sensor falls off early and replacements are needed before their next [employer].   Diabetes longstanding currently uncontrolled. A1c with small amount of improvement. I enrolled her with an 11.1 and her A1c today is 10.2%. When she was on Trulicity, her CGM report showed better control with a GMI of 7.7%. Unfortunately, GLP-1 RA therapy is cost prohibitive due to high deductible. Lantus is $17 for a month supply. After discussion, pt is agreeable to trying a low dose of Lantus. Patient is able to verbalize appropriate hypoglycemia management plan. -Started Lantus 10 units daily.  -Continued glipizide  for now at current dose.  -Patient educated on purpose, proper use, and potential adverse effects of Lantus.  -Extensively discussed pathophysiology of diabetes, recommended lifestyle interventions, dietary effects on blood sugar control.  -Counseled on s/sx of and management of hypoglycemia.  -Next A1c anticipated 05/2024.   Written patient instructions provided. Patient verbalized understanding of treatment plan.  Total time in face to face counseling 30 minutes.    Follow-up:  Pharmacist in 1 month.  Marene Shape, PharmD, Becky Bowels, CPP Clinical Pharmacist [facility] [phone]

## 2024-04-19 ENCOUNTER — Ambulatory Visit: Attending: Family Medicine | Admitting: Pharmacist

## 2024-04-19 ENCOUNTER — Encounter: Payer: Self-pay | Admitting: Pharmacist

## 2024-04-19 DIAGNOSIS — E119 Type 2 diabetes mellitus without complications: Secondary | ICD-10-CM

## 2024-04-19 DIAGNOSIS — Z794 Long term (current) use of insulin: Secondary | ICD-10-CM

## 2024-04-19 MED ORDER — DOXYCYCLINE HYCLATE 100 MG PO CAPS: 100.0000 mg | ORAL_CAPSULE | Freq: Two times a day (BID) | ORAL | 0 refills | Status: AC

## 2024-04-19 MED ORDER — FLUCONAZOLE 150 MG PO TABS: 150.0000 mg | ORAL_TABLET | Freq: Every day | ORAL | 0 refills | Status: DC

## 2024-04-19 NOTE — Progress Notes (Signed)
 S:     No chief complaint on file.  73 y.o. female who presents for diabetes evaluation, education, and management. Of note I have her enrolled in LIBERATE. Today, patient arrives in good spirits and presents without any assistance.  Patient was referred and last seen by Primary Care Provider, Dr. Delbert, on 11/24/23. A1c was 10.9 (up from 7.2 previously). Patient was referred to join LIBERATE study. I enrolled her 12/08/2023. Last appt with me was on 03/15/24. A1c showed minimal improvement from 11.1% to 10.2%. I started her on insulin  but she did not fill this.  When I first started with her this year, pt was able to fill Trulicity . After ~1-2 months, her copay card was exhausted and copay became cost-prohibitive. With her continued hyperglycemia, I had a discussion with her regarding the need for insulin . Last month she reluctantly agreed to start insulin  but tells me today she never filled it. She is symptomatic with polyuria, polydipsia, fatigue, and sx of VVC today. She requesting a refill on her fluconazole .   Family/Social History:  Fhx: heart attack (mother) Tobacco: 0.3 packs/day for 15 yrs  Current diabetes medications include: glipizide  10 mg (filled 12/09/23 for a 90-day), Trulicity  0.75 mg weekly (pt stopped d/t cost), Lantus  10 units daily (never filled)  Insurance coverage: BCBS  Patient denies hypoglycemic events.  Patient reports polyuria, polydipsia.  Patient reports neuropathy (nerve pain). Patient denies visual changes. Patient reports self foot exams.   Patient reported dietary habits: none reported  Patient-reported exercise habits: none    O:   ROS  Physical Exam  Libre3 CGM Download today for a 28-day report % Time CGM is active: 99% Average Glucose: 356 mg/dL Glucose Management Indicator: 11.8 Glucose Variability: 12.6% (goal <36%) Time in Goal:  - Time in range 70-13180: 100% - Time above range: 0% - Time below range: 0%:  Lab Results   Component Value Date   HGBA1C 10.2 (A) 03/15/2024   There were no vitals filed for this visit.  Lipid Panel     Component Value Date/Time   CHOL 107 05/27/2023 1106   TRIG 62 05/27/2023 1106   HDL 50 05/27/2023 1106   CHOLHDL 2.2 02/28/2021 0155   VLDL 12 02/28/2021 0155   LDLCALC 43 05/27/2023 1106    Clinical Atherosclerotic Cardiovascular Disease (ASCVD): Yes  The ASCVD Risk score (Arnett DK, et al., 2019) failed to calculate for the following reasons:   Risk score cannot be calculated because patient has a medical history suggesting prior/existing ASCVD   Patient is participating in a Managed Medicaid Plan: No    A/P: Diabetes longstanding currently uncontrolled. A1c last month was above goal and she has severe hyperglycemia listed on her CGM report. I called [facility] while Ms. Waitman was in clinic and authorized Lantus  and pen needle fills. She agrees to fill this today when she leaves here. I also emphasized that her VVC can be treated but hyperglycemia to this degree will cause recurrence. I will have her start insulin  and then call her in 2 weeks to follow-up with her regarding home CGM report. -Started Lantus  10 units daily.  -Continued glipizide  for now at current dose.  -Patient educated on purpose, proper use, and potential adverse effects of Lantus .  -Extensively discussed pathophysiology of diabetes, recommended lifestyle interventions, dietary effects on blood sugar control.  -Counseled on s/sx of and management of hypoglycemia.  -Next A1c anticipated 05/2024.   Written patient instructions provided. Patient verbalized understanding of treatment plan.  Total time in face to face counseling 30 minutes.    Follow-up:  Pharmacist in 1 month. 1 week telephone call.  Herlene Fleeta Morris, PharmD, JAQUELINE, CPP Clinical Pharmacist [facility] [phone]

## 2024-05-04 ENCOUNTER — Other Ambulatory Visit (HOSPITAL_BASED_OUTPATIENT_CLINIC_OR_DEPARTMENT_OTHER): Payer: Self-pay | Admitting: Pharmacist

## 2024-05-04 DIAGNOSIS — Z794 Long term (current) use of insulin: Secondary | ICD-10-CM

## 2024-05-04 DIAGNOSIS — E119 Type 2 diabetes mellitus without complications: Secondary | ICD-10-CM

## 2024-05-04 MED ORDER — LANTUS SOLOSTAR 100 UNIT/ML ~~LOC~~ SOPN: 20.0000 [IU] | PEN_INJECTOR | Freq: Every day | SUBCUTANEOUS | 2 refills | Status: DC

## 2024-05-04 NOTE — Progress Notes (Signed)
 I connected with  Tammy Graham on 05/04/24 by a video enabled telemedicine application and verified that I am speaking with the correct person using two identifiers.   I discussed the limitations of evaluation and management by telemedicine. The patient expressed understanding and agreed to proceed.  S:     No chief complaint on file.  73 y.o. female who presents for diabetes evaluation, education, and management. Of note I have her enrolled in LIBERATE. Today, patient is in good spirits. She started the insulin  10 units but sugars have still been > 300 at home. I called her today after seeing her Libreview report.   Patient was referred and last seen by Primary Care Provider, Dr. Delbert, on 11/24/23. A1c was 10.9 (up from 7.2 previously). Patient was referred to join LIBERATE study. I enrolled her 12/08/2023. Last appt with me was on 04/19/2024.   When I first started with her this year, pt was able to fill Trulicity . Her GMI over her first month of Trulicity  was 7.8%. After ~1-2 months, her copay card was exhausted and copay became cost-prohibitive. With her continued hyperglycemia, I had a discussion with her regarding the need for insulin . She reluctantly agreed to start insulin  but tells me today that her sugars remain high despite using 10 units as instructed.  Family/Social History:  Fhx: heart attack (mother) Tobacco: 0.3 packs/day for 15 yrs  Current diabetes medications include: Lantus  10 units daily  Insurance coverage: BCBS  Patient denies hypoglycemic events.  Patient reports polyuria, polydipsia.  Patient reports neuropathy (nerve pain). Patient denies visual changes. Patient reports self foot exams.   Patient reported dietary habits: none reported  Patient-reported exercise habits: none    O:   ROS  Physical Exam  Libre3 CGM Download today for a 14-day report % Time CGM is active: 86% Average Glucose: 349 mg/dL Glucose Management Indicator: 11.7 Glucose  Variability: 11.7% (goal <36%) Time in Goal:  - Time in range 70-13180: 100% - Time above range: 0% - Time below range: 0%:  Lab Results  Component Value Date   HGBA1C 10.2 (A) 03/15/2024   There were no vitals filed for this visit.  Lipid Panel     Component Value Date/Time   CHOL 107 05/27/2023 1106   TRIG 62 05/27/2023 1106   HDL 50 05/27/2023 1106   CHOLHDL 2.2 02/28/2021 0155   VLDL 12 02/28/2021 0155   LDLCALC 43 05/27/2023 1106    Clinical Atherosclerotic Cardiovascular Disease (ASCVD): Yes  The ASCVD Risk score (Arnett DK, et al., 2019) failed to calculate for the following reasons:   Risk score cannot be calculated because patient has a medical history suggesting prior/existing ASCVD   Patient is participating in a Managed Medicaid Plan: No    A/P: Diabetes longstanding currently uncontrolled. A1c in May was above goal and she has severe hyperglycemia listed on her CGM report. I collaborated with her pharmacy and she did fill Lantus  and pen needles 04/19/2024. I tried to call her last week after seeing continued hyperglycemia but she did not answer.  -Increase Lantus  to 20 units daily.  -Continued glipizide  for now at current dose.  -Patient educated on purpose, proper use, and potential adverse effects of Lantus .  -Extensively discussed pathophysiology of diabetes, recommended lifestyle interventions, dietary effects on blood sugar control.  -Counseled on s/sx of and management of hypoglycemia.  -Next A1c anticipated 05/2024.   Written patient instructions provided. Patient verbalized understanding of treatment plan.  Total time in face to  face counseling 30 minutes.    Follow-up:  Pharmacist 05/24/2024. 1 week telephone call.  Tammy Graham, PharmD, JAQUELINE, CPP Clinical Pharmacist [facility] [phone]

## 2024-05-23 ENCOUNTER — Telehealth: Payer: Self-pay | Admitting: Family Medicine

## 2024-05-23 NOTE — Telephone Encounter (Signed)
 Confirmed appt for  8/5

## 2024-05-24 ENCOUNTER — Ambulatory Visit: Admitting: Pharmacist

## 2024-06-01 ENCOUNTER — Other Ambulatory Visit (INDEPENDENT_AMBULATORY_CARE_PROVIDER_SITE_OTHER): Payer: Self-pay

## 2024-06-01 ENCOUNTER — Ambulatory Visit: Payer: BC Managed Care – PPO | Admitting: Orthopaedic Surgery

## 2024-06-01 ENCOUNTER — Encounter: Payer: Self-pay | Admitting: Orthopaedic Surgery

## 2024-06-01 DIAGNOSIS — Z96652 Presence of left artificial knee joint: Secondary | ICD-10-CM

## 2024-06-01 NOTE — Progress Notes (Signed)
 The patient is a 73 year old who is now about a year out from a left total knee arthroplasty.  She says there is only discomfort from time to time and she cannot stand for really long but she still works 10 hours a day.  She walks without assist device.  We were never able to flex her past 95 degrees at her last visit 6 months ago.  On exam today the left knee shows just some slight swelling.  She has full extension to about 95 degrees flexion.  It feels stable on exam.  She says is not bothering her today at all.  2 views of the left knee show well-seated total knee arthroplasty with no complicating features.  Since she is doing well overall, follow-up can be as needed.  If she does develop orthopedic issues she knows to reach out and let us  know.

## 2024-06-03 ENCOUNTER — Other Ambulatory Visit: Payer: Self-pay | Admitting: Family Medicine

## 2024-06-03 DIAGNOSIS — I160 Hypertensive urgency: Secondary | ICD-10-CM

## 2024-06-10 ENCOUNTER — Other Ambulatory Visit: Payer: Self-pay | Admitting: Pharmacist

## 2024-06-10 MED ORDER — LANTUS SOLOSTAR 100 UNIT/ML ~~LOC~~ SOPN: 30.0000 [IU] | PEN_INJECTOR | Freq: Every day | SUBCUTANEOUS | 2 refills | Status: DC

## 2024-06-13 ENCOUNTER — Telehealth: Payer: Self-pay | Admitting: Family Medicine

## 2024-06-13 NOTE — Telephone Encounter (Signed)
 LVM to confirm appt for 8/26

## 2024-06-14 ENCOUNTER — Ambulatory Visit: Admitting: Sports Medicine

## 2024-06-14 ENCOUNTER — Other Ambulatory Visit: Payer: Self-pay | Admitting: Family Medicine

## 2024-06-14 ENCOUNTER — Ambulatory Visit: Attending: Family Medicine | Admitting: Pharmacist

## 2024-06-14 ENCOUNTER — Encounter: Payer: Self-pay | Admitting: Pharmacist

## 2024-06-14 DIAGNOSIS — E119 Type 2 diabetes mellitus without complications: Secondary | ICD-10-CM

## 2024-06-14 DIAGNOSIS — I160 Hypertensive urgency: Secondary | ICD-10-CM

## 2024-06-14 DIAGNOSIS — Z794 Long term (current) use of insulin: Secondary | ICD-10-CM | POA: Diagnosis not present

## 2024-06-14 DIAGNOSIS — Z7984 Long term (current) use of oral hypoglycemic drugs: Secondary | ICD-10-CM

## 2024-06-14 LAB — POCT GLYCOSYLATED HEMOGLOBIN (HGB A1C): HbA1c, POC (controlled diabetic range): 9.8 % — AB (ref 0.0–7.0)

## 2024-06-14 MED ORDER — LANTUS SOLOSTAR 100 UNIT/ML ~~LOC~~ SOPN: 36.0000 [IU] | PEN_INJECTOR | Freq: Every day | SUBCUTANEOUS | 2 refills | Status: DC

## 2024-06-14 MED ORDER — FLUCONAZOLE 150 MG PO TABS: 150.0000 mg | ORAL_TABLET | Freq: Every day | ORAL | 0 refills | Status: DC

## 2024-06-14 MED ORDER — CHLORTHALIDONE 25 MG PO TABS: 25.0000 mg | ORAL_TABLET | Freq: Every day | ORAL | 1 refills | Status: DC

## 2024-06-14 NOTE — Progress Notes (Signed)
 S:     No chief complaint on file.  73 y.o. female who presents for diabetes evaluation, education, and management in the context of the LIBERATE Study.   Patient was referred and last seen by Primary Care Provider, Dr. Delbert, on 11/24/23. A1c was 10.9 (up from 7.2 previously). Patient was referred to join LIBERATE study. I enrolled her 12/08/2023. Last appt with me was on 03/15/24. A1c showed minimal improvement from 11.1% to 10.2%. I started her on insulin  and have been adjusting her dose.     Family/Social History:  -Fhx: heart attack -Tobacco: current 0.3 PPD smoker  -Alcohol : none reported   Current diabetes medications include: glargine insulin  30 units daily  Patient reports adherence to taking all medications as prescribed.  Patient denies hypoglycemic events.  CGM Study Study visit: 6 Month Follow Up Visit  CGM Data Download date: 06/14/2024; 28-day report % Time CGM Is Active: 99 % Average glucose (mg/dL): 760 mg/dL Glucose Management Indicator (%): 9 % Glucose Variability (%): 26.1 % Time Above Range >180 mg/dL (%): 83 % Time in Range 70-180 mg/dL (%): 17 % Time Below Range <70 mg/dL (%): 0 %  Diabetes Distress Scale Feeling like diabetes is taking up too much of my mental and physical energy every day.: Not a problem Feeling that my doctor doesn't know enough about diabetes and diabetic care. : Not a problem Feeling angry, scared, and/or depressed when I think about living with diabetes : Not a problem Feeling that my doctor doesn't give my clear directions on how to manage my diabetes. : Not a problem Feeling that im not testing my blood sugars frequently enough.: Not a problem Feeling that I'm often failing with my diabetes routine: Not a problem Feelling that friends and family are not supportive enough of self care efforts.: Not a problem Feeling that diabetes controls my life.: Not a problem Feeling that my doctor doesn't take my concerns seriously enough:  Not a problem Not feeling confident in my day to day ability to manage diabetes: Not a problem Feeling that I will end up with serious long term complications no matter what I do.: Not a problem Feeling that I am not sticking closely enough to a good meal plan.: Not a problem Feeling that friends or family don't appreciate how difficult living with diabetes can be. : Not a problem Feeling overwhelmed by the demands of living with diabetes.: Not a problem Feeling that I don't have a doctor who I can see.: Not a problem Not feeling motivated to keep up my diabetes self management.: Not a problem Feeling that friends or family don't give me the emotional support that I would like. : Not a problem DDS17 Score: 17 Emotional Burden Score: 1 Physician related distress score: 1 Regimen Related Distress score : 1 Interpersonal distress score: 1   Patient denies nocturia (nighttime urination).  Patient denies neuropathy (nerve pain). Patient denies visual changes. Patient reports self foot exams.   O:   Lab Results  Component Value Date   HGBA1C 9.8 (A) 06/14/2024   There were no vitals filed for this visit.  Lipid Panel     Component Value Date/Time   CHOL 107 05/27/2023 1106   TRIG 62 05/27/2023 1106   HDL 50 05/27/2023 1106   CHOLHDL 2.2 02/28/2021 0155   VLDL 12 02/28/2021 0155   LDLCALC 43 05/27/2023 1106   Clinical Atherosclerotic Cardiovascular Disease (ASCVD): Yes  The ASCVD Risk score (Arnett DK, et al., 2019)  failed to calculate for the following reasons:   Risk score cannot be calculated because patient has a medical history suggesting prior/existing ASCVD   Patient is participating in a Managed Medicaid Plan: No   A/P:  LIBERATE Study:  - Discussed options for obtaining continued supply of Libre 3 sensors.   Diabetes longstanding currently above goal but slowly improving. CGM report shows better control than her A1c indicates.  Patient is not currently symptomatic  but is able to verbalize appropriate hypoglycemia management plan. Medication adherence appears to be optimal. -Increased dose of Lantus  to 36 units daily.  -Patient educated on purpose, proper use, and potential adverse effects of insulin .  -Extensively discussed pathophysiology of diabetes, recommended lifestyle interventions, dietary effects on blood sugar control.  -Counseled on s/sx of and management of hypoglycemia.  -Next A1c anticipated 08/2024.   Written patient instructions provided. Patient verbalized understanding of treatment plan.  Total time in face to face counseling 30 minutes.    Follow-up:  Pharmacist in 4-6 weeks.  Herlene Fleeta Morris, PharmD, JAQUELINE, CPP Clinical Pharmacist [facility] [phone]

## 2024-07-04 ENCOUNTER — Other Ambulatory Visit: Payer: Self-pay

## 2024-07-28 ENCOUNTER — Telehealth: Payer: Self-pay | Admitting: Pharmacist

## 2024-07-28 NOTE — Telephone Encounter (Signed)
 Patient is calling back to schedule appointment. Patient hung up before could connect.

## 2024-07-28 NOTE — Telephone Encounter (Signed)
 Patient has appointment 11/4 does she need to come in before then.

## 2024-07-28 NOTE — Telephone Encounter (Signed)
 Can we schedule this patient with me?

## 2024-08-03 ENCOUNTER — Other Ambulatory Visit: Payer: Self-pay | Admitting: Pharmacist

## 2024-08-03 MED ORDER — FREESTYLE LIBRE 3 PLUS SENSOR MISC: 6 refills | Status: AC

## 2024-08-12 ENCOUNTER — Other Ambulatory Visit: Payer: Self-pay | Admitting: Pharmacist

## 2024-08-12 MED ORDER — FLUCONAZOLE 150 MG PO TABS: 150.0000 mg | ORAL_TABLET | Freq: Every day | ORAL | 0 refills | Status: AC

## 2024-08-22 ENCOUNTER — Encounter: Payer: Self-pay | Admitting: Radiology

## 2024-08-23 ENCOUNTER — Encounter: Payer: Self-pay | Admitting: Pharmacist

## 2024-08-23 ENCOUNTER — Telehealth: Payer: Self-pay | Admitting: Pharmacist

## 2024-08-23 ENCOUNTER — Other Ambulatory Visit: Payer: Self-pay

## 2024-08-23 ENCOUNTER — Ambulatory Visit: Attending: Family Medicine | Admitting: Pharmacist

## 2024-08-23 DIAGNOSIS — E1142 Type 2 diabetes mellitus with diabetic polyneuropathy: Secondary | ICD-10-CM | POA: Diagnosis not present

## 2024-08-23 DIAGNOSIS — E119 Type 2 diabetes mellitus without complications: Secondary | ICD-10-CM | POA: Diagnosis not present

## 2024-08-23 DIAGNOSIS — Z794 Long term (current) use of insulin: Secondary | ICD-10-CM | POA: Diagnosis not present

## 2024-08-23 DIAGNOSIS — Z7984 Long term (current) use of oral hypoglycemic drugs: Secondary | ICD-10-CM | POA: Diagnosis not present

## 2024-08-23 LAB — POCT GLYCOSYLATED HEMOGLOBIN (HGB A1C): HbA1c, POC (controlled diabetic range): 9 % — AB (ref 0.0–7.0)

## 2024-08-23 MED ORDER — PEN NEEDLES 32G X 4 MM MISC: 6 refills | Status: AC

## 2024-08-23 MED ORDER — TOUJEO SOLOSTAR 300 UNIT/ML ~~LOC~~ SOPN: 40.0000 [IU] | PEN_INJECTOR | Freq: Every day | SUBCUTANEOUS | 2 refills | Status: DC

## 2024-08-23 MED ORDER — GLIPIZIDE ER 10 MG PO TB24: 20.0000 mg | ORAL_TABLET | Freq: Every day | ORAL | 1 refills | Status: DC

## 2024-08-23 NOTE — Telephone Encounter (Signed)
 Hey friend. Can we submit a PA for this patient's Toujeo ? Complains of itching with Lantus .

## 2024-08-23 NOTE — Progress Notes (Signed)
    S:     No chief complaint on file.  73 y.o. female who presents for diabetes evaluation, education, and management.  Patient was referred and last seen by Primary Care Provider, Dr. Delbert, on 11/24/23.   Today, pt reports doing okay. Her A1c today is 9%, down from 9.8% in August. She is taking her Lantus  36 units daily but complains of itching and dry skin. She requests to change to a different insulin . She was previously enrolled in LIBERATE but now has to get CGM supplies through her insurance. Unfortunately, her copays are cost prohibitive and she is not checking her sugar levels at home.    Family/Social History:  -Fhx: heart attack -Tobacco: current 0.3 PPD smoker  -Alcohol : none reported   Current diabetes medications include: glipizide  20 mg XL daily, glargine insulin  36 units daily (Lantus ) Patient reports adherence to taking all medications as prescribed.   Patient denies hypoglycemic events.  Patient denies nocturia (nighttime urination).  Patient denies neuropathy (nerve pain). Patient denies visual changes. Patient reports self foot exams.   O:   Lab Results  Component Value Date   HGBA1C 9.8 (A) 06/14/2024   There were no vitals filed for this visit.  Lipid Panel     Component Value Date/Time   CHOL 107 05/27/2023 1106   TRIG 62 05/27/2023 1106   HDL 50 05/27/2023 1106   CHOLHDL 2.2 02/28/2021 0155   VLDL 12 02/28/2021 0155   LDLCALC 43 05/27/2023 1106   Clinical Atherosclerotic Cardiovascular Disease (ASCVD): Yes  The ASCVD Risk score (Arnett DK, et al., 2019) failed to calculate for the following reasons:   Risk score cannot be calculated because patient has a medical history suggesting prior/existing ASCVD   Patient is participating in a Managed Medicaid Plan: No   A/P: Diabetes longstanding currently above goal but slowly improving.  Patient is not currently symptomatic but is able to verbalize appropriate hypoglycemia management plan.  Medication adherence appears to be optimal. Will change Lantus  to Toujeo  for better absorption and to see if this improves her pruritis.  -Stop Lantus . Start Toujeo  40 units daily.  -Continue glipizide  20 mg XL daily.  -Patient educated on purpose, proper use, and potential adverse effects of insulin .  -Extensively discussed pathophysiology of diabetes, recommended lifestyle interventions, dietary effects on blood sugar control.  -Counseled on s/sx of and management of hypoglycemia.  -Next A1c anticipated 11/2024.   Written patient instructions provided. Patient verbalized understanding of treatment plan.  Total time in face to face counseling 30 minutes.    Follow-up:  Pharmacist in 2 months. PCP asap.   Herlene Fleeta Morris, PharmD, JAQUELINE, CPP Clinical Pharmacist [facility] [phone]

## 2024-08-31 NOTE — Telephone Encounter (Signed)
 Hey friend,   Was just following up on this. Are we able to attempt a PA for an early fill on Toujeo ? She just filled Lantus  08/18/24 so insurance is not wanting to pay. However, she has pruritus and thinks she is allergic to the Lantus . I know insurance will pay at the end of this month for Toujeo  but didn't know if there was anything for us  to before then.

## 2024-09-01 ENCOUNTER — Other Ambulatory Visit: Payer: Self-pay

## 2024-09-20 ENCOUNTER — Ambulatory Visit: Attending: Family Medicine | Admitting: Family Medicine

## 2024-09-20 ENCOUNTER — Encounter: Payer: Self-pay | Admitting: Family Medicine

## 2024-09-20 VITALS — BP 128/74 | HR 69 | Temp 98.6°F | Ht 66.0 in | Wt 160.0 lb

## 2024-09-20 DIAGNOSIS — E1159 Type 2 diabetes mellitus with other circulatory complications: Secondary | ICD-10-CM

## 2024-09-20 DIAGNOSIS — I739 Peripheral vascular disease, unspecified: Secondary | ICD-10-CM

## 2024-09-20 DIAGNOSIS — E1142 Type 2 diabetes mellitus with diabetic polyneuropathy: Secondary | ICD-10-CM | POA: Diagnosis not present

## 2024-09-20 DIAGNOSIS — E119 Type 2 diabetes mellitus without complications: Secondary | ICD-10-CM

## 2024-09-20 DIAGNOSIS — Z794 Long term (current) use of insulin: Secondary | ICD-10-CM

## 2024-09-20 DIAGNOSIS — E785 Hyperlipidemia, unspecified: Secondary | ICD-10-CM

## 2024-09-20 DIAGNOSIS — E1169 Type 2 diabetes mellitus with other specified complication: Secondary | ICD-10-CM | POA: Diagnosis not present

## 2024-09-20 DIAGNOSIS — Z7984 Long term (current) use of oral hypoglycemic drugs: Secondary | ICD-10-CM

## 2024-09-20 DIAGNOSIS — I152 Hypertension secondary to endocrine disorders: Secondary | ICD-10-CM

## 2024-09-20 DIAGNOSIS — Z96652 Presence of left artificial knee joint: Secondary | ICD-10-CM

## 2024-09-20 DIAGNOSIS — L299 Pruritus, unspecified: Secondary | ICD-10-CM

## 2024-09-20 DIAGNOSIS — R5383 Other fatigue: Secondary | ICD-10-CM

## 2024-09-20 MED ORDER — VALSARTAN 320 MG PO TABS: 320.0000 mg | ORAL_TABLET | Freq: Every day | ORAL | 1 refills | Status: AC

## 2024-09-20 MED ORDER — ATORVASTATIN CALCIUM 20 MG PO TABS: 20.0000 mg | ORAL_TABLET | Freq: Every day | ORAL | 1 refills | Status: AC

## 2024-09-20 MED ORDER — CARVEDILOL 12.5 MG PO TABS: ORAL_TABLET | ORAL | 1 refills | Status: AC

## 2024-09-20 MED ORDER — CHLORTHALIDONE 25 MG PO TABS: 25.0000 mg | ORAL_TABLET | Freq: Every day | ORAL | 1 refills | Status: AC

## 2024-09-20 MED ORDER — TRIAMCINOLONE ACETONIDE 0.1 % EX CREA: 1.0000 | TOPICAL_CREAM | Freq: Two times a day (BID) | CUTANEOUS | 0 refills | Status: AC

## 2024-09-20 MED ORDER — GLIPIZIDE ER 10 MG PO TB24: 20.0000 mg | ORAL_TABLET | Freq: Every day | ORAL | 1 refills | Status: AC

## 2024-09-20 MED ORDER — DULOXETINE HCL 60 MG PO CPEP: 60.0000 mg | ORAL_CAPSULE | Freq: Every day | ORAL | 1 refills | Status: AC

## 2024-09-20 NOTE — Progress Notes (Signed)
 Subjective:  Patient ID: Tammy Graham, female    DOB: 27-Mar-1951  Age: 73 y.o. MRN: [id number]  CC: Medical Management of Chronic Issues (Skin itching she may think its her medications/Discuss insulin /Lower right abdominal pain/)     Discussed the use of AI scribe software for clinical note transcription with the patient, who gave verbal consent to proceed.  History of Present Illness Tammy Graham is a 73 year old female with  a history of coronary artery disease (status post DES), type 2 diabetes mellitus (A1c 10.9), hypertension, PAD s/p right common femoral to above-knee popliteal bypass in 02/2021, status post right femoral artery stenting 02/2023, L knee OA status post left knee replacement 05/2023, tobacco abuse  who presents with itching and fatigue.  She reports itching and dry skin that started with Lantus  use, mainly on her feet, with significant discomfort after each dose. She stopped Lantus  on November 4 due to itching and surgery was prescribed by the clinical pharmacist as a replacement and the itching improved after stopping. She has not used any insulin  since stopping Lantus  and is waiting for insurance approval for Toujeo . Her A1c was 9.0 last month. She now wonders if another medication may also be causing itching but is unsure which one.  She has fatigue and low energy, which she thinks may be related to low vitamin B12. She works long shifts from 2 PM to 11 PM and feels this contributes to her fatigue and pain.  She has intermittent right lower abdominal pain that comes and goes. She denies nausea, vomiting, diarrhea, constipation, or blood in the stool. Bowel movements are regular.  She has body aches and pain in her legs and knees. Her left knee is swollen and painful, worse after prior surgery. She uses Tylenol  #3 sparingly for pain. She has neuropathy in her legs, worse in the right leg. Gabapentin  did not help in the past.    Past Medical History:  Diagnosis Date    Anxiety    Arthritis    shoulder and knees   CAD (coronary artery disease)    Complication of anesthesia    states she was still awake one time when surgery started   DM (diabetes mellitus) (HCC)    Family history of adverse reaction to anesthesia    tooks alot to put to sleep, slow to awaken.   HTN (hypertension)    Hyperlipemia    Myocardial infarction [facility])    Peripheral vascular disease    Pneumonia    Sleep apnea    no cpap   Stroke (HCC)    2012, no residual    Past Surgical History:  Procedure Laterality Date   ABDOMINAL AORTOGRAM W/LOWER EXTREMITY Bilateral 02/11/2021   Procedure: ABDOMINAL AORTOGRAM W/LOWER EXTREMITY;  Surgeon: Sheree Penne Bruckner, MD;  Location: [facility] INVASIVE CV LAB;  Service: Cardiovascular;  Laterality: Bilateral;   ABDOMINAL AORTOGRAM W/LOWER EXTREMITY Right 02/09/2023   Procedure: ABDOMINAL AORTOGRAM W/LOWER EXTREMITY;  Surgeon: Sheree Penne Bruckner, MD;  Location: [facility] INVASIVE CV LAB;  Service: Cardiovascular;  Laterality: Right;   CARDIAC CATHETERIZATION     COLONOSCOPY     COLONOSCOPY  09/25/2011   Procedure: COLONOSCOPY;  Surgeon: Lynwood LITTIE Celestia Mickey., MD;  Location: [facility] ENDOSCOPY;  Service: Endoscopy;  Laterality: N/A;   CORONARY STENT PLACEMENT     Ostial LAD stent in 2012   FEMORAL-POPLITEAL BYPASS GRAFT Right 02/27/2021   Procedure: RIGHT COMMON FEMORAL- ABOVE KNEE POPLITEAL ARTERY BYPASS GRAFT;  Surgeon: Sheree Penne Bruckner,  MD;  Location: [facility] OR;  Service: Vascular;  Laterality: Right;   Left knee surgery     PERIPHERAL VASCULAR INTERVENTION Right 02/09/2023   Procedure: PERIPHERAL VASCULAR INTERVENTION;  Surgeon: Sheree Penne Bruckner, MD;  Location: [facility] INVASIVE CV LAB;  Service: Cardiovascular;  Laterality: Right;  RT SFA   TOTAL KNEE ARTHROPLASTY Left 06/09/2023   Procedure: LEFT TOTAL KNEE ARTHROPLASTY;  Surgeon: Vernetta Bruckner GRADE, MD;  Location: [facility] ORS;  Service: Orthopedics;  Laterality: Left;    Family History   Problem Relation Age of Onset   Heart attack Mother    Anesthesia problems Neg Hx    Hypotension Neg Hx    Malignant hyperthermia Neg Hx    Pseudochol deficiency Neg Hx     Social History   Socioeconomic History   Marital status: Widowed    Spouse name: Not on file   Number of children: Not on file   Years of education: Not on file   Highest education level: Not on file  Occupational History   Not on file  Tobacco Use   Smoking status: Every Day    Current packs/day: 0.00    Average packs/day: 0.3 packs/day for 15.0 years (3.8 ttl pk-yrs)    Types: Cigarettes    Start date: 02/27/2006    Last attempt to quit: 02/27/2021    Years since quitting: 3.5   Smokeless tobacco: Never   Tobacco comments:    4-5 daily  Vaping Use   Vaping status: Never Used  Substance and Sexual Activity   Alcohol  use: Not Currently    Alcohol /week: 1.0 standard drink of alcohol     Types: 1 Standard drinks or equivalent per week   Drug use: No   Sexual activity: Not on file  Other Topics Concern   Not on file  Social History Narrative   Not on file   Social Drivers of Health   Financial Resource Strain: Low Risk  (11/24/2023)   Overall Financial Resource Strain (CARDIA)    Difficulty of Paying Living Expenses: Not very hard  Food Insecurity: No Food Insecurity (11/24/2023)   Hunger Vital Sign    Worried About Running Out of Food in the Last Year: Never true    Ran Out of Food in the Last Year: Never true  Transportation Needs: No Transportation Needs (11/24/2023)   PRAPARE - Administrator, Civil Service (Medical): No    Lack of Transportation (Non-Medical): No  Physical Activity: Insufficiently Active (11/24/2023)   Exercise Vital Sign    Days of Exercise per Week: 1 day    Minutes of Exercise per Session: 20 min  Stress: No Stress Concern Present (11/24/2023)   [employer] of Occupational Health - Occupational Stress Questionnaire    Feeling of Stress : Not at all  Social  Connections: Socially Isolated (11/24/2023)   Social Connection and Isolation Panel    Frequency of Communication with Friends and Family: Once a week    Frequency of Social Gatherings with Friends and Family: Never    Attends Religious Services: Never    Database Administrator or Organizations: Yes    Attends Banker Meetings: Never    Marital Status: Widowed    Allergies  Allergen Reactions   Chlorthalidone  Other (See Comments)    headache   Plavix  [Clopidogrel  Bisulfate] Itching   Rosuvastatin  Hives    Patient is allergic to generic rosuvastatin . She says she is able to take branded Crestor .    Simvastatin Other (  See Comments)    unknown   Tramadol  Itching   Benadryl  [Diphenhydramine  Hcl (Sleep)] Rash    Outpatient Medications Prior to Visit  Medication Sig Dispense Refill   acetaminophen -codeine  (TYLENOL  #3) 300-30 MG tablet Take 1 tablet by mouth at bedtime as needed for moderate pain (pain score 4-6). 30 tablet 0   aspirin  81 MG chewable tablet Chew 1 tablet (81 mg total) by mouth 2 (two) times daily. 30 tablet 0   Blood Glucose Monitoring Suppl (ONETOUCH VERIO) w/Device KIT Use to test blood sugar 3 times daily 1 kit 0   Continuous Glucose Sensor (FREESTYLE LIBRE 3 PLUS SENSOR) MISC Change sensor every 15 days. Use to check blood sugar continuously. 2 each 6   glucose blood (ONETOUCH VERIO) test strip Use to test blood sugar 3 times daily 100 each 12   Insulin  Pen Needle (PEN NEEDLES) 32G X 4 MM MISC Use to inject Toujeo  once daily. 100 each 6   Lancets (ONETOUCH ULTRASOFT) lancets Use to test blood sugar 3 times daily 100 each 12   Misc. Devices MISC Shower bench, Above the toilet seat.  Diagnosis- Type 2 Diabetes Mellitus 1 each 0   Misc. Devices MISC Blood pressure monitor.  Diagnosis hypertension 1 each 0   Multiple Vitamin (MULTIVITAMIN WITH MINERALS) TABS tablet Take 1 tablet by mouth in the morning.     Naphazoline-Pheniramine (VISINE OP) Place 1 drop into  both eyes daily.     nicotine  (NICODERM CQ ) 14 mg/24hr patch Place 1 patch (14 mg total) onto the skin daily. 28 patch 2   tiZANidine  (ZANAFLEX ) 2 MG tablet Take 1 tablet (2 mg total) by mouth every 8 (eight) hours as needed for muscle spasms. 30 tablet 1   atorvastatin  (LIPITOR) 20 MG tablet Take 1 tablet (20 mg total) by mouth daily. 90 tablet 1   carvedilol  (COREG ) 12.5 MG tablet TAKE 1 TABLET(12.5 MG) BY MOUTH TWICE DAILY WITH A MEAL 180 tablet 1   chlorthalidone  (HYGROTON ) 25 MG tablet Take 1 tablet (25 mg total) by mouth daily. 90 tablet 1   glipiZIDE  (GLUCOTROL  XL) 10 MG 24 hr tablet Take 2 tablets (20 mg total) by mouth daily with breakfast. 180 tablet 1   insulin  glargine, 1 Unit Dial, (TOUJEO  SOLOSTAR) 300 UNIT/ML Solostar Pen Inject 40 Units into the skin daily. 4.5 mL 2   valsartan  (DIOVAN ) 320 MG tablet Take 1 tablet (320 mg total) by mouth daily. 90 tablet 1   amoxicillin -clavulanate (AUGMENTIN ) 875-125 MG tablet Take 1 tablet by mouth 2 (two) times daily. (Patient not taking: Reported on 09/20/2024) 20 tablet 0   fluconazole  (DIFLUCAN ) 150 MG tablet Take 1 tablet (150 mg total) by mouth daily. (Patient not taking: Reported on 09/20/2024) 1 tablet 0   No facility-administered medications prior to visit.     ROS Review of Systems  Constitutional:  Positive for fatigue. Negative for activity change and appetite change.  HENT:  Negative for sinus pressure and sore throat.   Respiratory:  Negative for chest tightness, shortness of breath and wheezing.   Cardiovascular:  Negative for chest pain and palpitations.  Gastrointestinal:  Positive for abdominal pain. Negative for abdominal distention and constipation.  Genitourinary: Negative.   Musculoskeletal:        See HPI  Skin:  Negative for pallor.  Psychiatric/Behavioral:  Negative for behavioral problems and dysphoric mood.     Objective:  BP 128/74   Pulse 69   Temp 98.6 F (37 C) (Oral)  Ht 5' 6 (1.676 m)   Wt 160 lb  (72.6 kg)   SpO2 97%   BMI 25.82 kg/m      09/20/2024    9:35 AM 11/24/2023    2:13 PM 06/15/2023    1:34 PM  BP/Weight  Systolic BP 128 127 128  Diastolic BP 74 88 64  Wt. (Lbs) 160 156.4   BMI 25.82 kg/m2 25.24 kg/m2       Physical Exam Constitutional:      Appearance: She is well-developed.  Cardiovascular:     Rate and Rhythm: Normal rate.     Heart sounds: Normal heart sounds. No murmur heard. Pulmonary:     Effort: Pulmonary effort is normal.     Breath sounds: Normal breath sounds. No wheezing or rales.  Chest:     Chest wall: No tenderness.  Abdominal:     General: Bowel sounds are normal. There is no distension.     Palpations: Abdomen is soft. There is no mass.     Tenderness: There is no abdominal tenderness.  Musculoskeletal:        General: Normal range of motion.     Right lower leg: No edema.     Left lower leg: No edema.     Comments: Left knee with vertical healed surgical scar, edema, crepitus on ROM  Neurological:     Mental Status: She is alert and oriented to person, place, and time.  Psychiatric:        Mood and Affect: Mood normal.        Latest Ref Rng & Units 11/24/2023    2:43 PM 06/10/2023    3:51 AM 05/27/2023   11:06 AM  CMP  Glucose 70 - 99 mg/dL 730  844  876   BUN 8 - 27 mg/dL 20  15  12    Creatinine 0.57 - 1.00 mg/dL 8.75  8.97  8.99   Sodium 134 - 144 mmol/L 139  137  144   Potassium 3.5 - 5.2 mmol/L 3.6  4.0  4.4   Chloride 96 - 106 mmol/L 95  103  104   CO2 20 - 29 mmol/L 27  26  26    Calcium  8.7 - 10.3 mg/dL 89.4  9.2  9.9   Total Protein 6.0 - 8.5 g/dL   6.6   Total Bilirubin 0.0 - 1.2 mg/dL   0.7   Alkaline Phos 44 - 121 IU/L   135   AST 0 - 40 IU/L   23   ALT 0 - 32 IU/L   26     Lipid Panel     Component Value Date/Time   CHOL 107 05/27/2023 1106   TRIG 62 05/27/2023 1106   HDL 50 05/27/2023 1106   CHOLHDL 2.2 02/28/2021 0155   VLDL 12 02/28/2021 0155   LDLCALC 43 05/27/2023 1106    CBC    Component Value  Date/Time   WBC 11.4 (H) 06/10/2023 0351   RBC 4.26 06/10/2023 0351   HGB 14.0 06/10/2023 0351   HGB 15.3 07/01/2019 1550   HCT 41.8 06/10/2023 0351   HCT 46.5 07/01/2019 1550   PLT 179 06/10/2023 0351   PLT 257 07/01/2019 1550   MCV 98.1 06/10/2023 0351   MCV 98 (H) 07/01/2019 1550   MCH 32.9 06/10/2023 0351   MCHC 33.5 06/10/2023 0351   RDW 13.4 06/10/2023 0351   RDW 12.6 07/01/2019 1550   LYMPHSABS 2.9 09/11/2016 2316   MONOABS 0.4 09/11/2016 2316  EOSABS 0.2 09/11/2016 2316   BASOSABS 0.0 09/11/2016 2316    Lab Results  Component Value Date   HGBA1C 9.0 (A) 08/23/2024   Lab Results  Component Value Date   HGBA1C 9.0 (A) 08/23/2024   HGBA1C 9.8 (A) 06/14/2024   HGBA1C 10.2 (A) 03/15/2024    Lab Results  Component Value Date   TSH 0.400 01/11/2014        Assessment & Plan Type 2 diabetes mellitus with diabetic polyneuropathy Diabetes management suboptimal, A1c 9.0, goal is less than 7.0. Allergic reaction to Lantus . Neuropathy symptoms persist, especially in right leg. - Discontinued Lantus . - Initiated Toujeo  40 units daily, continue glipizide  - Prescribed duloxetine  for neuropathy and pain management. - Ordered comprehensive metabolic panel, lipid panel, vitamin B12, and vitamin D  levels. -Counseled on Diabetic diet, the healthy plate, 849 minutes of moderate intensity exercise/week Blood sugar logs with fasting goals of 80-120 mg/dl, random of less than 819 and in the event of sugars less than 60 mg/dl or greater than 599 mg/dl encouraged to notify the clinic. Advised on the need for annual eye exams, annual foot exams, Pneumonia vaccine.   Pain in left knee with left knee prosthesis Intermittent pain post-surgery, worsens with prolonged walking. - Prescribed duloxetine  for knee pain management. - She previously needed Tylenol  3 prior to her surgery.  If pain is controlled on duloxetine  we will not need Tylenol  3 however if it is uncontrolled I will resume  Tylenol  3 to use sparingly  Hypertension associated with type 2 diabetes mellitus -Controlled - Refill medications for 90-day supply. -Counseled on blood pressure goal of less than 130/80, low-sodium, DASH diet, medication compliance, 150 minutes of moderate intensity exercise per week. Discussed medication compliance, adverse effects.   Peripheral artery disease -Status post bypass - Secondary risk factor modification - Continue statin and Plavix  - Refill medications for 90-day supply.  Hyperlipidemia associated with type 2 diabetes mellitus -LDL at goal - Continue statin - Low-cholesterol diet - Ordered lipid panel.  Pruritus Persistent itching, possibly medication-related. Resolved after discontinuing Lantus  but persists with other medications. - Prescribed triamcinolone cream for itching. - Advised elimination diet to identify potential allergen medication.   Fatigue Will check vitamin D  level, thyroid, vitamin B12, CBC  General Health Maintenance - Refill medications for 90-day supply.      Meds ordered this encounter  Medications   DULoxetine  (CYMBALTA ) 60 MG capsule    Sig: Take 1 capsule (60 mg total) by mouth daily. For neuropathy and chronic pain    Dispense:  90 capsule    Refill:  1   atorvastatin  (LIPITOR) 20 MG tablet    Sig: Take 1 tablet (20 mg total) by mouth daily.    Dispense:  90 tablet    Refill:  1   carvedilol  (COREG ) 12.5 MG tablet    Sig: TAKE 1 TABLET(12.5 MG) BY MOUTH TWICE DAILY WITH A MEAL    Dispense:  180 tablet    Refill:  1   chlorthalidone  (HYGROTON ) 25 MG tablet    Sig: Take 1 tablet (25 mg total) by mouth daily.    Dispense:  90 tablet    Refill:  1   glipiZIDE  (GLUCOTROL  XL) 10 MG 24 hr tablet    Sig: Take 2 tablets (20 mg total) by mouth daily with breakfast.    Dispense:  180 tablet    Refill:  1    **Patient requests 90 days supply**   valsartan  (DIOVAN ) 320 MG tablet  Sig: Take 1 tablet (320 mg total) by mouth  daily.    Dispense:  90 tablet    Refill:  1   triamcinolone cream (KENALOG) 0.1 %    Sig: Apply 1 Application topically 2 (two) times daily.    Dispense:  30 g    Refill:  0    Follow-up: Return in about 3 months (around 12/19/2024) for Chronic medical conditions.       Corrina Sabin, MD, FAAFP. [facility] [place], [place] [phone]   09/20/2024, 5:27 PM

## 2024-09-20 NOTE — Patient Instructions (Signed)
 VISIT SUMMARY:  Today, we discussed your ongoing issues with diabetes, itching, fatigue, knee pain, and other health concerns. We reviewed your current medications and made some adjustments to better manage your symptoms and overall health.  YOUR PLAN:  -TYPE 2 DIABETES MELLITUS WITH DIABETIC POLYNEUROPATHY: Your diabetes management needs improvement, as indicated by your A1c level of 9.0. We have stopped Lantus  due to an allergic reaction and started you on Toujeo  at 40 units daily. We also prescribed duloxetine  to help manage your neuropathy and pain. Additionally, we ordered blood tests to check your metabolic panel, lipid levels, vitamin B12, and vitamin D .  -PAIN IN LEFT KNEE WITH LEFT KNEE PROSTHESIS: You have intermittent pain in your left knee, which worsens with prolonged walking. We have prescribed duloxetine  to help manage this pain.  -HYPERTENSION: Your blood pressure needs to be controlled. We have refilled your medications for a 90-day supply to help manage your hypertension.  -PERIPHERAL ARTERY DISEASE: This condition affects the blood flow to your limbs. We have refilled your medications for a 90-day supply to help manage this condition.  -HYPERLIPIDEMIA: This condition involves high levels of fats in your blood. We have ordered a lipid panel to check your current levels.  -PRURITUS: You have persistent itching, which may be related to your medications. We have prescribed triamcinolone cream to help with the itching and advised you to try an elimination diet to identify any potential allergens.  -GENERAL HEALTH MAINTENANCE: We have refilled your medications for a 90-day supply to ensure you have enough until your next visit. We also recommend continuing with a healthy lifestyle, including a balanced diet and regular exercise.  INSTRUCTIONS:  Please follow up with the blood tests we ordered, including the comprehensive metabolic panel, lipid panel, vitamin B12, and vitamin D   levels. Continue using the prescribed medications and follow the elimination diet to identify any potential allergens causing your itching. Schedule a follow-up appointment to review your test results and assess your progress.

## 2024-09-22 ENCOUNTER — Ambulatory Visit: Payer: Self-pay | Admitting: Family Medicine

## 2024-09-22 LAB — CMP14+EGFR
ALT: 29 IU/L (ref 0–32)
AST: 21 IU/L (ref 0–40)
Albumin: 4.4 g/dL (ref 3.8–4.8)
Alkaline Phosphatase: 112 IU/L (ref 49–135)
BUN/Creatinine Ratio: 14 (ref 12–28)
BUN: 18 mg/dL (ref 8–27)
Bilirubin Total: 0.6 mg/dL (ref 0.0–1.2)
CO2: 30 mmol/L — ABNORMAL HIGH (ref 20–29)
Calcium: 11.4 mg/dL — ABNORMAL HIGH (ref 8.7–10.3)
Chloride: 95 mmol/L — ABNORMAL LOW (ref 96–106)
Creatinine, Ser: 1.26 mg/dL — ABNORMAL HIGH (ref 0.57–1.00)
Globulin, Total: 2.8 g/dL (ref 1.5–4.5)
Glucose: 401 mg/dL — ABNORMAL HIGH (ref 70–99)
Potassium: 3.9 mmol/L (ref 3.5–5.2)
Sodium: 138 mmol/L (ref 134–144)
Total Protein: 7.2 g/dL (ref 6.0–8.5)
eGFR: 45 mL/min/1.73 — ABNORMAL LOW (ref >59)

## 2024-09-22 LAB — LP+NON-HDL CHOLESTEROL
Cholesterol, Total: 101 mg/dL (ref 100–199)
HDL: 36 mg/dL — ABNORMAL LOW (ref >39)
LDL Chol Calc (NIH): 33 mg/dL (ref 0–99)
Total Non-HDL-Chol (LDL+VLDL): 65 mg/dL (ref 0–129)
Triglycerides: 204 mg/dL — ABNORMAL HIGH (ref 0–149)
VLDL Cholesterol Cal: 32 mg/dL (ref 5–40)

## 2024-09-22 LAB — VITAMIN B12: Vitamin B-12: 1379 pg/mL — ABNORMAL HIGH (ref 232–1245)

## 2024-09-22 LAB — MICROALBUMIN / CREATININE URINE RATIO
Creatinine, Urine: 75 mg/dL
Microalb/Creat Ratio: 50 mg/g{creat} — AB (ref 0–29)
Microalbumin, Urine: 37.4 ug/mL

## 2024-09-22 LAB — VITAMIN D 25 HYDROXY (VIT D DEFICIENCY, FRACTURES): Vit D, 25-Hydroxy: 36 ng/mL (ref 30.0–100.0)

## 2024-10-26 ENCOUNTER — Other Ambulatory Visit: Payer: Self-pay | Admitting: Pharmacist

## 2024-10-26 ENCOUNTER — Encounter: Payer: Self-pay | Admitting: Pharmacist

## 2024-10-26 NOTE — Progress Notes (Signed)
 Pharmacy Quality Measure Review  I have been assisting Ms. Schaper with her insulin . Activated a copay card for her in November to mitigate the cost of her Toujeo . I called her pharmacy today and it will be ready for pick-up at 4 PM. Called the patient to inform her of this.   Herlene Fleeta Morris, PharmD, JAQUELINE, CPP Clinical Pharmacist [facility] [phone]

## 2024-11-01 ENCOUNTER — Other Ambulatory Visit: Payer: Self-pay

## 2024-11-01 ENCOUNTER — Encounter: Payer: Self-pay | Admitting: Pharmacist

## 2024-11-01 ENCOUNTER — Ambulatory Visit: Attending: Family Medicine | Admitting: Pharmacist

## 2024-11-01 DIAGNOSIS — Z7984 Long term (current) use of oral hypoglycemic drugs: Secondary | ICD-10-CM | POA: Diagnosis not present

## 2024-11-01 DIAGNOSIS — Z794 Long term (current) use of insulin: Secondary | ICD-10-CM | POA: Diagnosis not present

## 2024-11-01 DIAGNOSIS — E1142 Type 2 diabetes mellitus with diabetic polyneuropathy: Secondary | ICD-10-CM

## 2024-11-01 MED ORDER — TRULICITY 0.75 MG/0.5ML ~~LOC~~ SOAJ: 0.7500 mg | SUBCUTANEOUS | 1 refills | Status: AC

## 2024-11-01 MED ORDER — ACCU-CHEK SOFTCLIX LANCETS MISC: 6 refills | Status: AC

## 2024-11-01 MED ORDER — ACCU-CHEK GUIDE W/DEVICE KIT: PACK | 0 refills | Status: AC

## 2024-11-01 MED ORDER — HYDROXYZINE HCL 10 MG PO TABS: 10.0000 mg | ORAL_TABLET | Freq: Three times a day (TID) | ORAL | 0 refills | Status: AC | PRN

## 2024-11-01 MED ORDER — ACCU-CHEK GUIDE TEST VI STRP: ORAL_STRIP | 6 refills | Status: AC

## 2024-11-01 NOTE — Progress Notes (Signed)
" ° ° °  S:     No chief complaint on file.  74 y.o. female who presents for diabetes evaluation, education, and management.  Patient was referred and last seen by Primary Care Provider, Dr. Delbert, on 09/20/2024.   Today, pt reports doing okay. Her A1c in November was 9%, down from 9.8% in August of 2025. She is taking her Toujeo  40 units daily but continues to experience dry skin. No lesions or hives but thinks the insulin  is causing her skin to dry out. She was previously enrolled in [employer] but now does not check sugar readings at home. CGM supplies are cost prohibitive with her insurance. She wonders if she can change back to Trulicity .   Family/Social History:  -Fhx: heart attack -Tobacco: current 0.3 PPD smoker  -Alcohol : none reported   Current diabetes medications include: glipizide  20 mg XL daily, glargine insulin  40 units daily (Lantus ) Patient reports adherence to taking all medications as prescribed.   Patient denies hypoglycemic events.  Patient denies nocturia (nighttime urination).  Patient denies neuropathy (nerve pain). Patient denies visual changes. Patient reports self foot exams.   O:   Lab Results  Component Value Date   HGBA1C 9.0 (A) 08/23/2024   There were no vitals filed for this visit.  Lipid Panel     Component Value Date/Time   CHOL 101 09/20/2024 1039   TRIG 204 (H) 09/20/2024 1039   HDL 36 (L) 09/20/2024 1039   CHOLHDL 2.2 02/28/2021 0155   VLDL 12 02/28/2021 0155   LDLCALC 33 09/20/2024 1039   Clinical Atherosclerotic Cardiovascular Disease (ASCVD): Yes  The ASCVD Risk score (Arnett DK, et al., 2019) failed to calculate for the following reasons:   Risk score cannot be calculated because patient has a medical history suggesting prior/existing ASCVD   * - Cholesterol units were assumed   Patient is participating in a Managed Medicaid Plan: No   A/P: Diabetes longstanding currently above goal but slowly improving. A1c at last check was  9.0%, due for a repeat level in Feb. Patient is not currently symptomatic but is able to verbalize appropriate hypoglycemia management plan. Medication adherence appears to be optimal. Will continue Toujeo  for now. I have enrolled her in a copay card to bring the Trulicity  down to ~$103/month. She is amenable to restart this. Once she starts Trulicity , she can decrease her insulin  to 20 units daily.  -Continue Toujeo  40 units daily.  -Continue glipizide  20 mg XL daily.  -Start Trulicity  0.75 mg weekly. Once started, may decrease Toujeo  to 20 units daily in the setting of hypoglycemia. -Accu Chek supplies sent. -Patient educated on purpose, proper use, and potential adverse effects of insulin .  -Extensively discussed pathophysiology of diabetes, recommended lifestyle interventions, dietary effects on blood sugar control.  -Counseled on s/sx of and management of hypoglycemia.  -Next A1c anticipated 11/2024.  -Labs per PCP.  Written patient instructions provided. Patient verbalized understanding of treatment plan.  Total time in face to face counseling 30 minutes.    Follow-up:  Pharmacist in 1 month.  Herlene Fleeta Morris, PharmD, JAQUELINE, CPP Clinical Pharmacist [facility] [phone]   "

## 2024-11-02 ENCOUNTER — Ambulatory Visit: Payer: Self-pay | Admitting: Family Medicine

## 2024-11-02 LAB — PTH, INTACT AND CALCIUM
Calcium: 10.1 mg/dL (ref 8.7–10.3)
PTH: 44 pg/mL (ref 15–65)

## 2024-11-23 ENCOUNTER — Other Ambulatory Visit: Payer: Self-pay

## 2024-11-23 DIAGNOSIS — I739 Peripheral vascular disease, unspecified: Secondary | ICD-10-CM

## 2024-12-06 ENCOUNTER — Ambulatory Visit: Payer: Self-pay | Admitting: Pharmacist

## 2024-12-20 ENCOUNTER — Ambulatory Visit (HOSPITAL_COMMUNITY)

## 2024-12-20 ENCOUNTER — Ambulatory Visit: Admitting: Family Medicine

## 2024-12-20 ENCOUNTER — Ambulatory Visit
